# Patient Record
Sex: Female | Born: 1950 | Race: Black or African American | Hispanic: No | Smoking: Former smoker
Health system: Southern US, Community
[De-identification: ages and names within clinical notes are randomized; demographics above are authoritative.]

## PROBLEM LIST (undated history)

## (undated) DIAGNOSIS — I10 Essential (primary) hypertension: Secondary | ICD-10-CM

## (undated) DIAGNOSIS — E785 Hyperlipidemia, unspecified: Secondary | ICD-10-CM

## (undated) DIAGNOSIS — I639 Cerebral infarction, unspecified: Secondary | ICD-10-CM

## (undated) HISTORY — DX: Hyperlipidemia, unspecified: E78.5

## (undated) HISTORY — PX: FOOT SURGERY: SHX648

## (undated) HISTORY — DX: Cerebral infarction, unspecified: I63.9

---

## 2009-07-13 ENCOUNTER — Encounter (HOSPITAL_BASED_OUTPATIENT_CLINIC_OR_DEPARTMENT_OTHER): Admission: RE | Admit: 2009-07-13 | Discharge: 2009-07-31 | Payer: Self-pay | Admitting: Internal Medicine

## 2009-08-09 ENCOUNTER — Encounter (HOSPITAL_BASED_OUTPATIENT_CLINIC_OR_DEPARTMENT_OTHER): Admission: RE | Admit: 2009-08-09 | Discharge: 2009-08-28 | Payer: Self-pay | Admitting: Internal Medicine

## 2010-07-24 ENCOUNTER — Other Ambulatory Visit
Admission: RE | Admit: 2010-07-24 | Discharge: 2010-07-24 | Payer: Self-pay | Source: Home / Self Care | Admitting: Family Medicine

## 2013-04-15 ENCOUNTER — Other Ambulatory Visit (HOSPITAL_COMMUNITY)
Admission: RE | Admit: 2013-04-15 | Discharge: 2013-04-15 | Disposition: A | Discharge status: Home / Self Care | Payer: 59 | Source: Ambulatory Visit | Attending: Family Medicine | Admitting: Family Medicine

## 2013-04-15 ENCOUNTER — Other Ambulatory Visit: Payer: Self-pay | Admitting: Family Medicine

## 2013-04-15 DIAGNOSIS — Z124 Encounter for screening for malignant neoplasm of cervix: Secondary | ICD-10-CM | POA: Insufficient documentation

## 2016-05-13 ENCOUNTER — Other Ambulatory Visit (HOSPITAL_COMMUNITY)
Admission: RE | Admit: 2016-05-13 | Discharge: 2016-05-13 | Disposition: A | Discharge status: Home / Self Care | Payer: 59 | Source: Ambulatory Visit | Attending: Physician Assistant | Admitting: Physician Assistant

## 2016-05-13 ENCOUNTER — Other Ambulatory Visit: Payer: Self-pay | Admitting: Physician Assistant

## 2016-05-13 DIAGNOSIS — Z01411 Encounter for gynecological examination (general) (routine) with abnormal findings: Secondary | ICD-10-CM | POA: Diagnosis not present

## 2016-05-23 LAB — CYTOLOGY - PAP

## 2016-12-03 DIAGNOSIS — L239 Allergic contact dermatitis, unspecified cause: Secondary | ICD-10-CM | POA: Diagnosis not present

## 2016-12-03 DIAGNOSIS — M79672 Pain in left foot: Secondary | ICD-10-CM | POA: Diagnosis not present

## 2016-12-03 DIAGNOSIS — L84 Corns and callosities: Secondary | ICD-10-CM | POA: Diagnosis not present

## 2016-12-03 DIAGNOSIS — M2041 Other hammer toe(s) (acquired), right foot: Secondary | ICD-10-CM | POA: Diagnosis not present

## 2017-05-03 ENCOUNTER — Inpatient Hospital Stay (HOSPITAL_COMMUNITY)
Admission: EM | Admit: 2017-05-03 | Discharge: 2017-05-06 | DRG: 065 | Disposition: A | Discharge status: Rehabilitation Facility | Payer: 59 | Attending: Internal Medicine | Admitting: Internal Medicine

## 2017-05-03 ENCOUNTER — Emergency Department (HOSPITAL_COMMUNITY): Payer: 59

## 2017-05-03 ENCOUNTER — Observation Stay (HOSPITAL_COMMUNITY): Payer: 59

## 2017-05-03 ENCOUNTER — Encounter (HOSPITAL_COMMUNITY): Payer: Self-pay | Admitting: *Deleted

## 2017-05-03 DIAGNOSIS — G8194 Hemiplegia, unspecified affecting left nondominant side: Secondary | ICD-10-CM | POA: Diagnosis present

## 2017-05-03 DIAGNOSIS — I10 Essential (primary) hypertension: Secondary | ICD-10-CM

## 2017-05-03 DIAGNOSIS — Z6830 Body mass index (BMI) 30.0-30.9, adult: Secondary | ICD-10-CM

## 2017-05-03 DIAGNOSIS — R531 Weakness: Secondary | ICD-10-CM | POA: Diagnosis not present

## 2017-05-03 DIAGNOSIS — R29810 Facial weakness: Secondary | ICD-10-CM | POA: Diagnosis present

## 2017-05-03 DIAGNOSIS — D62 Acute posthemorrhagic anemia: Secondary | ICD-10-CM | POA: Diagnosis not present

## 2017-05-03 DIAGNOSIS — E876 Hypokalemia: Secondary | ICD-10-CM

## 2017-05-03 DIAGNOSIS — E669 Obesity, unspecified: Secondary | ICD-10-CM | POA: Diagnosis present

## 2017-05-03 DIAGNOSIS — E785 Hyperlipidemia, unspecified: Secondary | ICD-10-CM | POA: Diagnosis present

## 2017-05-03 DIAGNOSIS — W19XXXA Unspecified fall, initial encounter: Secondary | ICD-10-CM

## 2017-05-03 DIAGNOSIS — R29711 NIHSS score 11: Secondary | ICD-10-CM | POA: Diagnosis present

## 2017-05-03 DIAGNOSIS — R52 Pain, unspecified: Secondary | ICD-10-CM

## 2017-05-03 DIAGNOSIS — E119 Type 2 diabetes mellitus without complications: Secondary | ICD-10-CM | POA: Diagnosis present

## 2017-05-03 DIAGNOSIS — Z23 Encounter for immunization: Secondary | ICD-10-CM

## 2017-05-03 DIAGNOSIS — Z823 Family history of stroke: Secondary | ICD-10-CM

## 2017-05-03 DIAGNOSIS — I639 Cerebral infarction, unspecified: Principal | ICD-10-CM | POA: Diagnosis present

## 2017-05-03 DIAGNOSIS — R414 Neurologic neglect syndrome: Secondary | ICD-10-CM | POA: Diagnosis not present

## 2017-05-03 DIAGNOSIS — Z720 Tobacco use: Secondary | ICD-10-CM

## 2017-05-03 DIAGNOSIS — F17210 Nicotine dependence, cigarettes, uncomplicated: Secondary | ICD-10-CM | POA: Diagnosis present

## 2017-05-03 HISTORY — DX: Essential (primary) hypertension: I10

## 2017-05-03 LAB — CBG MONITORING, ED: Glucose-Capillary: 145 mg/dL — ABNORMAL HIGH (ref 65–99)

## 2017-05-03 LAB — DIFFERENTIAL
BASOS ABS: 0 10*3/uL (ref 0.0–0.1)
Basophils Relative: 0 %
Eosinophils Absolute: 0 10*3/uL (ref 0.0–0.7)
Eosinophils Relative: 0 %
Lymphocytes Relative: 20 %
Lymphs Abs: 1.6 10*3/uL (ref 0.7–4.0)
MONO ABS: 0.3 10*3/uL (ref 0.1–1.0)
MONOS PCT: 4 %
Neutro Abs: 5.9 10*3/uL (ref 1.7–7.7)
Neutrophils Relative %: 76 %

## 2017-05-03 LAB — COMPREHENSIVE METABOLIC PANEL
ALK PHOS: 48 U/L (ref 38–126)
ALT: 19 U/L (ref 14–54)
AST: 36 U/L (ref 15–41)
Albumin: 3.8 g/dL (ref 3.5–5.0)
Anion gap: 9 (ref 5–15)
BUN: 12 mg/dL (ref 6–20)
CALCIUM: 9.5 mg/dL (ref 8.9–10.3)
CO2: 25 mmol/L (ref 22–32)
CREATININE: 0.85 mg/dL (ref 0.44–1.00)
Chloride: 105 mmol/L (ref 101–111)
GFR calc non Af Amer: >60 mL/min (ref >60)
GLUCOSE: 149 mg/dL — AB (ref 65–99)
Potassium: 4.3 mmol/L (ref 3.5–5.1)
SODIUM: 139 mmol/L (ref 135–145)
Total Bilirubin: 1.3 mg/dL — ABNORMAL HIGH (ref 0.3–1.2)
Total Protein: 8.1 g/dL (ref 6.5–8.1)

## 2017-05-03 LAB — URINALYSIS, ROUTINE W REFLEX MICROSCOPIC
BILIRUBIN URINE: NEGATIVE
GLUCOSE, UA: NEGATIVE mg/dL
HGB URINE DIPSTICK: NEGATIVE
Ketones, ur: NEGATIVE mg/dL
NITRITE: NEGATIVE
PH: 8 (ref 5.0–8.0)
Protein, ur: NEGATIVE mg/dL
SPECIFIC GRAVITY, URINE: 1.025 (ref 1.005–1.030)

## 2017-05-03 LAB — CBC
HEMATOCRIT: 37 % (ref 36.0–46.0)
Hemoglobin: 12.3 g/dL (ref 12.0–15.0)
MCH: 33 pg (ref 26.0–34.0)
MCHC: 33.2 g/dL (ref 30.0–36.0)
MCV: 99.2 fL (ref 78.0–100.0)
PLATELETS: 212 10*3/uL (ref 150–400)
RBC: 3.73 MIL/uL — AB (ref 3.87–5.11)
RDW: 12.6 % (ref 11.5–15.5)
WBC: 7.8 10*3/uL (ref 4.0–10.5)

## 2017-05-03 LAB — I-STAT TROPONIN, ED: Troponin i, poc: 0.01 ng/mL (ref 0.00–0.08)

## 2017-05-03 LAB — I-STAT CHEM 8, ED
BUN: 18 mg/dL (ref 6–20)
CALCIUM ION: 1.1 mmol/L — AB (ref 1.15–1.40)
CHLORIDE: 107 mmol/L (ref 101–111)
Creatinine, Ser: 0.7 mg/dL (ref 0.44–1.00)
GLUCOSE: 149 mg/dL — AB (ref 65–99)
HCT: 38 % (ref 36.0–46.0)
Hemoglobin: 12.9 g/dL (ref 12.0–15.0)
Potassium: 4.3 mmol/L (ref 3.5–5.1)
Sodium: 141 mmol/L (ref 135–145)
TCO2: 26 mmol/L (ref 22–32)

## 2017-05-03 LAB — PROTIME-INR
INR: 1
Prothrombin Time: 13.1 seconds (ref 11.4–15.2)

## 2017-05-03 LAB — RAPID URINE DRUG SCREEN, HOSP PERFORMED
AMPHETAMINES: NOT DETECTED
BARBITURATES: NOT DETECTED
BENZODIAZEPINES: NOT DETECTED
Cocaine: NOT DETECTED
Opiates: NOT DETECTED
TETRAHYDROCANNABINOL: NOT DETECTED

## 2017-05-03 LAB — APTT: aPTT: 27 seconds (ref 24–36)

## 2017-05-03 LAB — ETHANOL: Alcohol, Ethyl (B): <10 mg/dL (ref <10)

## 2017-05-03 MED ORDER — ACETAMINOPHEN 650 MG RE SUPP: 650.0000 mg | Freq: Four times a day (QID) | RECTAL | Status: DC | PRN

## 2017-05-03 MED ORDER — ACETAMINOPHEN 325 MG PO TABS
650.0000 mg | ORAL_TABLET | Freq: Four times a day (QID) | ORAL | Status: DC | PRN
  Administered 2017-05-06 (×2): 650 mg via ORAL
  Filled 2017-05-03 (×3): qty 2

## 2017-05-03 MED ORDER — TETANUS-DIPHTH-ACELL PERTUSSIS 5-2.5-18.5 LF-MCG/0.5 IM SUSP: 0.5000 mL | Freq: Once | INTRAMUSCULAR | Status: DC

## 2017-05-03 MED ORDER — NICOTINE 14 MG/24HR TD PT24
14.0000 mg | MEDICATED_PATCH | Freq: Every day | TRANSDERMAL | Status: DC
  Administered 2017-05-04 – 2017-05-06 (×3): 14 mg via TRANSDERMAL
  Filled 2017-05-03 (×3): qty 1

## 2017-05-03 MED ORDER — ASPIRIN 325 MG PO TABS
325.0000 mg | ORAL_TABLET | Freq: Every day | ORAL | Status: DC
  Administered 2017-05-04 – 2017-05-06 (×3): 325 mg via ORAL
  Filled 2017-05-03 (×3): qty 1

## 2017-05-03 MED ORDER — PNEUMOCOCCAL VAC POLYVALENT 25 MCG/0.5ML IJ INJ
0.5000 mL | INJECTION | INTRAMUSCULAR | Status: AC | PRN
  Administered 2017-05-05: 0.5 mL via INTRAMUSCULAR
  Filled 2017-05-03: qty 0.5

## 2017-05-03 MED ORDER — KETOROLAC TROMETHAMINE 30 MG/ML IJ SOLN
30.0000 mg | Freq: Once | INTRAMUSCULAR | Status: AC
  Administered 2017-05-04: 30 mg via INTRAVENOUS
  Filled 2017-05-03: qty 1

## 2017-05-03 MED ORDER — STROKE: EARLY STAGES OF RECOVERY BOOK
Freq: Once | Status: AC
  Administered 2017-05-03: 22:00:00
  Filled 2017-05-03: qty 1

## 2017-05-03 MED ORDER — INFLUENZA VAC SPLIT HIGH-DOSE 0.5 ML IM SUSY
0.5000 mL | PREFILLED_SYRINGE | INTRAMUSCULAR | Status: AC
  Administered 2017-05-05: 0.5 mL via INTRAMUSCULAR
  Filled 2017-05-03 (×2): qty 0.5

## 2017-05-03 MED ORDER — SODIUM CHLORIDE 0.9% FLUSH
3.0000 mL | Freq: Two times a day (BID) | INTRAVENOUS | Status: DC
  Administered 2017-05-03 – 2017-05-06 (×6): 3 mL via INTRAVENOUS

## 2017-05-03 MED ORDER — KETOROLAC TROMETHAMINE 30 MG/ML IJ SOLN
30.0000 mg | Freq: Once | INTRAMUSCULAR | Status: AC
  Administered 2017-05-03: 30 mg via INTRAVENOUS
  Filled 2017-05-03: qty 1

## 2017-05-03 MED ORDER — LIDOCAINE-EPINEPHRINE 2 %-1:100000 IJ SOLN: 20.0000 mL | Freq: Once | INTRAMUSCULAR | Status: DC

## 2017-05-03 MED ORDER — IOPAMIDOL (ISOVUE-370) INJECTION 76%: 100.0000 mL | Freq: Once | INTRAVENOUS | Status: DC | PRN

## 2017-05-03 MED ORDER — HEPARIN SODIUM (PORCINE) 5000 UNIT/ML IJ SOLN
5000.0000 [IU] | Freq: Three times a day (TID) | INTRAMUSCULAR | Status: DC
  Administered 2017-05-03 – 2017-05-06 (×8): 5000 [IU] via SUBCUTANEOUS
  Filled 2017-05-03 (×8): qty 1

## 2017-05-03 NOTE — ED Notes (Signed)
Report attempted 

## 2017-05-03 NOTE — ED Notes (Signed)
Bladder scanned

## 2017-05-03 NOTE — ED Triage Notes (Signed)
Pt woke this morning at 0730 and called Daughter because she felt funny. Pt last remembers feeling well on 9-28 At 2130 before bed.  When daughter and EMS entered home Pt was found on floor beside bed.  Pt presented to EMS with  Lt sided facial droop and let sided flacid to Lt arm and Lt leg.  Pt has a current healing RT ankle Fx that occurred in August 2018

## 2017-05-03 NOTE — Progress Notes (Signed)
Placed a call to the RN in the emergency room that gave report on the patient to verify the stroke swallow screen was completed.  Maryfrances Bunnell, RN stated it was completed in the emergency room and that the patient failed the exam.  A MBSS has been ordered for the patient.  Will continue to monitor.  Wilson Singer, RN

## 2017-05-03 NOTE — H&P (Signed)
Date: 05/03/2017               Patient Name:  Kelly Lowe MRN: [id number]  DOB: 04/13/1951 Age / Sex: 66 y.o., female   PCP: Patient, No Pcp Per         Medical Service: Internal Medicine Teaching Service         Attending Physician: Dr. Lynnae January, Real Cons, MD    First Contact: Dr. Lurlean Horns Pager: [phone]  Second Contact: Dr. Tiburcio Pea Pager: [phone]       After Hours (After 5p/  First Contact Pager: [phone]  weekends / holidays): Second Contact Pager: [phone]   Chief Complaint: L arm and leg weakness   History of Present Illness:  Kelly Lowe is a 66 y.o. female with a history of HTN and tobacco abuse who presents to the ED with L sided weakness that started this morning. Patient stated she was in her usual state of health yesterday prior to going to bed. This morning when she wakes up she felt sick and a heaviness over the left side of her body. When she attempted to get out of bed she was unable to do it due to left sided weakness and fell on her buttocks. She proceeded to call her daughter who then called 911 and she arrived to the ED by ambulance. She denied headache, dizziness, changes in vision, and changes in speech at the time. Her only complaint when seen was lower back pain. No history of CVA in the past. She has been compliant with her home medicines. Denies dysphagia, chest pain, palpitations, shortness of breath, abdominal pain, N/V.   ED course: Patient was hemodynamically stable on arrival to the ED. Her BP was 137/66. Head CT showed R caudate infarction. Lab work was unremarkable.    Review of Systems: A complete ROS was negative except as per HPI.   Meds:  Current Meds  Medication Sig  . amLODipine (NORVASC) 5 MG tablet Take 5 mg by mouth daily.  Marland Kitchen BIOTIN PO Take 1 tablet by mouth daily.  . Cyanocobalamin (VITAMIN B 12 PO) Take 1 tablet by mouth daily.  . hydrochlorothiazide (HYDRODIURIL) 12.5 MG tablet Take 12.5 mg by mouth every  morning.  Marland Kitchen ibuprofen (ADVIL,MOTRIN) 200 MG tablet Take 200 mg by mouth every 6 (six) hours as needed for mild pain.  . Multiple Vitamin (MULTIVITAMIN WITH MINERALS) TABS tablet Take 1 tablet by mouth daily.     Allergies: Allergies as of 05/03/2017  . (No Known Allergies)   Past Medical History:  Diagnosis Date  . Hypertension     Family History:  Stroke - maternal and paternal grandparents   Social History:  Patient lives home alone and able to do chores by herself. Compliant with her medications but does not restrict sodium in her diet. Smokes half a pack of cigarettes since her teenage years. Drinks alcohol only on special occasions, per daughters. Denied drug use.    Physical Exam: Blood pressure 139/86, pulse 66, resp. rate 20, SpO2 97 %.  General: pleasant female, obese, well-developed, lying in bed on her left side, seemed uncomfortable but in no acute distress HENT: NCAT, neck supple and FROM, OP clear without exudates or erythema, MMM Eyes: anicteric sclera, PERRL Cardiac: regular rate and rhythm, nl S1/S2, no murmurs, rubs or gallops  Pulm: CTAB, no wheezes or crackles, no increased work of breathing  Abd: soft, NTND, bowel sounds present  Neuro: A&Ox3, CN II-XII intact, 1/5 strength on  both LUE and LLE, sensation intact in all four extremities, no dysmetria or dysdiadochokinesia on the R, unable to test on the L  Ext: warm and well perfused, no peripheral edema, wears ankle boot at baseline due to recent R foot fracture in 03/2017   EKG: personally reviewed my interpretation is NSR with HR 77 bpm, normal intervals, no axis deviation, T-wave flattening and V2 and V3, no acute signs of ischemia noted   Assessment & Plan by Problem:  [name] is a 66 y.o. 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   # R caudate CVA: Patient presented with LUE and LLE weakness that started  this morning and was found to have R caudate CVA on head CT. Head and neck CT angio with no abnormalities. She was stable when seen in the ED. Neurological deficits include: LUE and LLE weakness (1/5 bilaterally). Risk factors include: HTN and long history of tobacco abuse. Neurology following.  - Will allow permissive HTN up to 220/120 for 24-48 hrs  - MRI brain w/o contrast  - Risk stratification: A1c + lipid panel - TTE  - PT + OT + Speech consult  - On telemetry   # Lower back pain: Patient reports lower back pain that started this morning after she fell from her bed.  No bruising noted on exam and no point tenderness over spinous processes. She does have paraspinal muscle tenderness bilaterally. Will obtain imaging to rule out lumbar spine and hip fracture. - F/u lumbar spine XR and b/l hip XR  - IV Toradol x1 for pain   # HTN: On amlodipine 5 mg QD and HCTZ 12.5 mg QD at home.  - Hold home antihypertensive medications and allow permissive HTN as above   # Tobacco abuse: Patient smokes 1/2 pack of cigarettes per day since teenage years. - Nicotine patch  - Educated patient on the importance of smoking cessation  - Encouraged smoking cessation    F: none  E: will monitor  N: NPO pending swallow study --> Low sodium diet   VTE ppx: SQ heparin   Code status: Full code, confirmed on admission   Dispo: Admit patient to Observation with expected length of stay less than 2 midnights.  Signed: Welford Roche, MD  Internal Medicine PGY-1  P [phone]

## 2017-05-03 NOTE — Progress Notes (Signed)
Patient admitted to unit at 1850 from ED. Report received from [place], [place]. Patient alert and oriented and given instructions on how to use call bell and phone. Put on telemetry box 39. Will continue to monitor.

## 2017-05-03 NOTE — Code Documentation (Signed)
66 year old female presents to [facility] ED via [facility] as code stroke.  She reports all normal last night at 2130 when going to bed.  On awaking this AM she noted left side weakness and was found on the floor by her daughter and EMS this AM.  She presents to ED with left facial droop, left arm and left leg flacid and some dysarthria with right gaze preference.  She was met at the bridge by stroke team - taken to CT scan.  Initial NIHSS 11. Dr. Leonel Ramsay at the bedside in CT scan - CTA and CTP ordered.  No acute treatment based on scan results per Dr. Leonel Ramsay.  Handoff to [employer].

## 2017-05-03 NOTE — ED Notes (Signed)
Pt placed on PureWick catheter per Luellen Pucker Investment banker, corporate)

## 2017-05-03 NOTE — ED Provider Notes (Signed)
Emergency Department Provider Note   I have reviewed the triage vital signs and the nursing notes.   HISTORY  Chief Complaint Cerebrovascular Accident   HPI Kelly Lowe is a 66 y.o. female with unknown past medical history the presents to the emergency department today with last known well of last night.  She started having some abdominal pain and weakness last night but not focal. This when she woke up and realized she could not move her left arm or her left leg and her family know she had a left facial droop so EMS was called and she is brought here for evaluation. Patient unable to provide much more history.  Level V Caveat secondary to acuity   Past Medical History:  Diagnosis Date  . Hypertension     Patient Active Problem List   Diagnosis Date Noted  . Stroke [facility]) 05/03/2017    Past Surgical History:  Procedure Laterality Date  . FOOT SURGERY        Allergies Patient has no known allergies.  History reviewed. No pertinent family history.  Social History Social History  Substance Use Topics  . Smoking status: Current Every Day Smoker    Packs/day: 0.50    Years: 25.00    Types: Cigarettes  . Smokeless tobacco: Never Used  . Alcohol use 0.6 oz/week    1 Glasses of wine per week     Comment: social    Review of Systems  All other systems negative except as documented in the HPI. All pertinent positives and negatives as reviewed in the HPI. ____________________________________________   PHYSICAL EXAM:  VITAL SIGNS: ED Triage Vitals  Enc Vitals Group     BP      Pulse      Resp      Temp      Temp src      SpO2      Weight      Height      Head Circumference      Peak Flow      Pain Score      Pain Loc      Pain Edu?      Excl. in [place]?     Constitutional: Alert and oriented. Well appearing and in no acute distress. Eyes: Conjunctivae are normal. PERRL. EOMI. Head: Atraumatic. Nose: No  congestion/rhinnorhea. Mouth/Throat: Mucous membranes are moist.  Oropharynx non-erythematous. Neck: No stridor.  No meningeal signs.   Cardiovascular: Normal rate, regular rhythm. Good peripheral circulation. Grossly normal heart sounds.   Respiratory: Normal respiratory effort.  No retractions. Lungs CTAB. Gastrointestinal: Soft and nontender. No distention.  Musculoskeletal: No lower extremity tenderness nor edema. No gross deformities of extremities. Neurologic:  Normal speech and language. Significant L facial paralysis and L extremity strength.  Skin:  Skin is warm, dry and intact. No rash noted.   ____________________________________________   LABS (all labs ordered are listed, but only abnormal results are displayed)  Labs Reviewed  CBC - Abnormal; Notable for the following:       Result Value   RBC 3.73 (*)    All other components within normal limits  COMPREHENSIVE METABOLIC PANEL - Abnormal; Notable for the following:    Glucose, Bld 149 (*)    Total Bilirubin 1.3 (*)    All other components within normal limits  URINALYSIS, ROUTINE W REFLEX MICROSCOPIC - Abnormal; Notable for the following:    Color, Urine STRAW (*)    Leukocytes, UA TRACE (*)  Bacteria, UA RARE (*)    Squamous Epithelial / LPF 0-5 (*)    All other components within normal limits  I-STAT CHEM 8, ED - Abnormal; Notable for the following:    Glucose, Bld 149 (*)    Calcium, Ion 1.10 (*)    All other components within normal limits  CBG MONITORING, ED - Abnormal; Notable for the following:    Glucose-Capillary 145 (*)    All other components within normal limits  ETHANOL  PROTIME-INR  APTT  DIFFERENTIAL  RAPID URINE DRUG SCREEN, HOSP PERFORMED  I-STAT TROPONIN, ED   ____________________________________________  EKG   EKG Interpretation  Date/Time:  Saturday May 03 2017 08:55:17 EDT Ventricular Rate:  77 PR Interval:    QRS Duration: 94 QT Interval:  406 QTC Calculation: 460 R  Axis:   6 Text Interpretation:  Sinus rhythm Abnormal R-wave progression, early transition Borderline T abnormalities, anterior leads No old tracing to compare Confirmed by Merrily Pew [phone]) on 05/03/2017 10:54:23 AM      ____________________________________________  RADIOLOGY  Ct Angio Head W Or Wo Contrast  Result Date: 05/03/2017 CLINICAL DATA:  Left facial droop.  Right gaze. EXAM: CT ANGIOGRAPHY HEAD AND NECK CT PERFUSION BRAIN TECHNIQUE: Multidetector CT imaging of the head and neck was performed using the standard protocol during bolus administration of intravenous contrast. Multiplanar CT image reconstructions and MIPs were obtained to evaluate the vascular anatomy. Carotid stenosis measurements (when applicable) are obtained utilizing NASCET criteria, using the distal internal carotid diameter as the denominator. Multiphase CT imaging of the brain was performed following IV bolus contrast injection. Subsequent parametric perfusion maps were calculated using RAPID software. CONTRAST:  90 mL Isovue 370 COMPARISON:  Noncontrast head CT earlier today FINDINGS: CTA NECK FINDINGS Aortic arch: Standard 3 vessel aortic arch with mild atherosclerotic plaque. Widely patent brachiocephalic and subclavian arteries. Right carotid system: Patent without evidence of stenosis or dissection. Left carotid system: Patent without evidence of stenosis or dissection. Vertebral arteries: Patent without evidence of stenosis or dissection. The right vertebral artery is slightly larger than the left. Skeleton: No fracture or suspicious osseous lesion. Other neck: Mild thyroid heterogeneity without sizable discrete nodule. Upper chest: Minimal atelectasis in the visualized lungs. Review of the MIP images confirms the above findings CTA HEAD FINDINGS Anterior circulation: The internal carotid arteries are patent from skullbase to carotid termini without stenosis. There is minimal right cavernous segment atherosclerosis.  ACAs and MCAs are patent without evidence of proximal branch occlusion or significant proximal stenosis. The left A1 segment is moderately dominant. No aneurysm. Posterior circulation: The intracranial vertebral arteries are patent to the basilar with the right being mildly dominant. There is mild V4 segment atherosclerosis and irregularity without significant stenosis. Patent bilateral PICA, left AICA, and bilateral SCA origins are identified. The basilar artery is widely patent. Posterior communicating arteries are diminutive or absent. PCAs are patent with moderate right and mild left P3 and more distal branch vessel irregularity and narrowing but no significant proximal stenosis. No aneurysm. Venous sinuses: As permitted by contrast timing, patent. Anatomic variants: None. Delayed phase: Not performed. Review of the MIP images confirms the above findings CT Brain Perfusion Findings: CBF (<30%) Volume:  0 mL Perfusion (Tmax>6.0s) volume:  0 mL Mismatch Volume: n/a Infarction Location: n/a IMPRESSION: 1. Negative cerebral perfusion imaging. 2. No emergent large vessel occlusion. 3. Intracranial atherosclerosis with most notable involvement of right greater than left PCA branch vessels. No flow limiting proximal stenosis. 4. Widely patent cervical  carotid and vertebral arteries. Preliminary findings of negative perfusion imaging and no emergent large vessel occlusion were communicated in person to Dr. Leonel Ramsay on 05/03/2017 at 9:20 a.m. Electronically Signed   By: Logan Bores M.D.   On: 05/03/2017 09:41   Dg Lumbar Spine Complete  Result Date: 05/03/2017 CLINICAL DATA:  Low back pain since falling this morning. EXAM: LUMBAR SPINE - COMPLETE 4+ VIEW COMPARISON:  None. FINDINGS: Five lumbar type vertebral bodies. There is a minimal convex right scoliosis. The lateral alignment is normal. The disc spaces are preserved. Facet degenerative changes are present inferiorly. There is no evidence of acute fracture or  pars defect. There is contrast material in the bladder from preceding CT scan. Aortic atherosclerosis noted. IMPRESSION: No evidence of acute lumbar spine injury.  Facet hypertrophy noted. Electronically Signed   By: Richardean Sale M.D.   On: 05/03/2017 16:43   Ct Angio Neck W Or Wo Contrast  Result Date: 05/03/2017 CLINICAL DATA:  Left facial droop.  Right gaze. EXAM: CT ANGIOGRAPHY HEAD AND NECK CT PERFUSION BRAIN TECHNIQUE: Multidetector CT imaging of the head and neck was performed using the standard protocol during bolus administration of intravenous contrast. Multiplanar CT image reconstructions and MIPs were obtained to evaluate the vascular anatomy. Carotid stenosis measurements (when applicable) are obtained utilizing NASCET criteria, using the distal internal carotid diameter as the denominator. Multiphase CT imaging of the brain was performed following IV bolus contrast injection. Subsequent parametric perfusion maps were calculated using RAPID software. CONTRAST:  90 mL Isovue 370 COMPARISON:  Noncontrast head CT earlier today FINDINGS: CTA NECK FINDINGS Aortic arch: Standard 3 vessel aortic arch with mild atherosclerotic plaque. Widely patent brachiocephalic and subclavian arteries. Right carotid system: Patent without evidence of stenosis or dissection. Left carotid system: Patent without evidence of stenosis or dissection. Vertebral arteries: Patent without evidence of stenosis or dissection. The right vertebral artery is slightly larger than the left. Skeleton: No fracture or suspicious osseous lesion. Other neck: Mild thyroid heterogeneity without sizable discrete nodule. Upper chest: Minimal atelectasis in the visualized lungs. Review of the MIP images confirms the above findings CTA HEAD FINDINGS Anterior circulation: The internal carotid arteries are patent from skullbase to carotid termini without stenosis. There is minimal right cavernous segment atherosclerosis. ACAs and MCAs are patent  without evidence of proximal branch occlusion or significant proximal stenosis. The left A1 segment is moderately dominant. No aneurysm. Posterior circulation: The intracranial vertebral arteries are patent to the basilar with the right being mildly dominant. There is mild V4 segment atherosclerosis and irregularity without significant stenosis. Patent bilateral PICA, left AICA, and bilateral SCA origins are identified. The basilar artery is widely patent. Posterior communicating arteries are diminutive or absent. PCAs are patent with moderate right and mild left P3 and more distal branch vessel irregularity and narrowing but no significant proximal stenosis. No aneurysm. Venous sinuses: As permitted by contrast timing, patent. Anatomic variants: None. Delayed phase: Not performed. Review of the MIP images confirms the above findings CT Brain Perfusion Findings: CBF (<30%) Volume:  0 mL Perfusion (Tmax>6.0s) volume:  0 mL Mismatch Volume: n/a Infarction Location: n/a IMPRESSION: 1. Negative cerebral perfusion imaging. 2. No emergent large vessel occlusion. 3. Intracranial atherosclerosis with most notable involvement of right greater than left PCA branch vessels. No flow limiting proximal stenosis. 4. Widely patent cervical carotid and vertebral arteries. Preliminary findings of negative perfusion imaging and no emergent large vessel occlusion were communicated in person to Dr. Leonel Ramsay on 05/03/2017 at 9:20  a.m. Electronically Signed   By: Logan Bores M.D.   On: 05/03/2017 09:41   Ct Cerebral Perfusion W Contrast  Result Date: 05/03/2017 CLINICAL DATA:  Left facial droop.  Right gaze. EXAM: CT ANGIOGRAPHY HEAD AND NECK CT PERFUSION BRAIN TECHNIQUE: Multidetector CT imaging of the head and neck was performed using the standard protocol during bolus administration of intravenous contrast. Multiplanar CT image reconstructions and MIPs were obtained to evaluate the vascular anatomy. Carotid stenosis  measurements (when applicable) are obtained utilizing NASCET criteria, using the distal internal carotid diameter as the denominator. Multiphase CT imaging of the brain was performed following IV bolus contrast injection. Subsequent parametric perfusion maps were calculated using RAPID software. CONTRAST:  90 mL Isovue 370 COMPARISON:  Noncontrast head CT earlier today FINDINGS: CTA NECK FINDINGS Aortic arch: Standard 3 vessel aortic arch with mild atherosclerotic plaque. Widely patent brachiocephalic and subclavian arteries. Right carotid system: Patent without evidence of stenosis or dissection. Left carotid system: Patent without evidence of stenosis or dissection. Vertebral arteries: Patent without evidence of stenosis or dissection. The right vertebral artery is slightly larger than the left. Skeleton: No fracture or suspicious osseous lesion. Other neck: Mild thyroid heterogeneity without sizable discrete nodule. Upper chest: Minimal atelectasis in the visualized lungs. Review of the MIP images confirms the above findings CTA HEAD FINDINGS Anterior circulation: The internal carotid arteries are patent from skullbase to carotid termini without stenosis. There is minimal right cavernous segment atherosclerosis. ACAs and MCAs are patent without evidence of proximal branch occlusion or significant proximal stenosis. The left A1 segment is moderately dominant. No aneurysm. Posterior circulation: The intracranial vertebral arteries are patent to the basilar with the right being mildly dominant. There is mild V4 segment atherosclerosis and irregularity without significant stenosis. Patent bilateral PICA, left AICA, and bilateral SCA origins are identified. The basilar artery is widely patent. Posterior communicating arteries are diminutive or absent. PCAs are patent with moderate right and mild left P3 and more distal branch vessel irregularity and narrowing but no significant proximal stenosis. No aneurysm. Venous  sinuses: As permitted by contrast timing, patent. Anatomic variants: None. Delayed phase: Not performed. Review of the MIP images confirms the above findings CT Brain Perfusion Findings: CBF (<30%) Volume:  0 mL Perfusion (Tmax>6.0s) volume:  0 mL Mismatch Volume: n/a Infarction Location: n/a IMPRESSION: 1. Negative cerebral perfusion imaging. 2. No emergent large vessel occlusion. 3. Intracranial atherosclerosis with most notable involvement of right greater than left PCA branch vessels. No flow limiting proximal stenosis. 4. Widely patent cervical carotid and vertebral arteries. Preliminary findings of negative perfusion imaging and no emergent large vessel occlusion were communicated in person to Dr. Leonel Ramsay on 05/03/2017 at 9:20 a.m. Electronically Signed   By: Logan Bores M.D.   On: 05/03/2017 09:41   Dg Hips Bilat With Pelvis 3-4 Views  Result Date: 05/03/2017 CLINICAL DATA:  Low back pain following a fall this morning. EXAM: DG HIP (WITH OR WITHOUT PELVIS) 3-4V BILAT COMPARISON:  None. FINDINGS: Excreted contrast in the urinary bladder. Normal appearing hips without fracture or dislocation. Lower lumbar spine degenerative changes. IMPRESSION: No hip fracture or dislocation. Electronically Signed   By: Claudie Revering M.D.   On: 05/03/2017 16:43   Ct Head Code Stroke Wo Contrast  Result Date: 05/03/2017 CLINICAL DATA:  Code stroke. Left-sided flaccid. Gaze to the left and facial droop. EXAM: CT HEAD WITHOUT CONTRAST TECHNIQUE: Contiguous axial images were obtained from the base of the skull through the vertex without intravenous  contrast. COMPARISON:  None. FINDINGS: Brain: Age-indeterminate lenticulostriate pattern infarct in the right caudate head. No visible cortical infarct. No hemorrhage, hydrocephalus, or masslike finding. There is chronic small vessel ischemic type change in the deep cerebral white matter. Vascular: Atherosclerotic calcification.  No hyperdense vessel. Skull: No acute or  aggressive finding. Sinuses/Orbits: No acute finding Other: Prelim text page sent 05/03/2017 at 8:42 am to Dr. Leonel Ramsay. ASPECTS [facility] Stroke Program Early CT Score) - Ganglionic level infarction (caudate, lentiform nuclei, internal capsule, insula, M1-M3 cortex): 6 - Supraganglionic infarction (M4-M6 cortex): 3 Total score (0-10 with 10 being normal): 9 IMPRESSION: 1. Age indeterminate right caudate head infarction with a lenticulostriate pattern. ASPECTS is 9. 2. Negative for acute hemorrhage. 3. Chronic small vessel ischemia. Electronically Signed   By: Monte Fantasia M.D.   On: 05/03/2017 08:43    ____________________________________________   PROCEDURES  Procedure(s) performed:   Procedures   ____________________________________________   INITIAL IMPRESSION / ASSESSMENT AND PLAN / ED COURSE  Pertinent labs & imaging results that were available during my care of the patient were reviewed by me and considered in my medical decision making (see chart for details).  Stroke. Not tPA candidate as not sure when it happened and no salvageable penumbra on cta. Will admit to medicine.    ____________________________________________  FINAL CLINICAL IMPRESSION(S) / ED DIAGNOSES  Final diagnoses:  Cerebral infarction, unspecified mechanism ([place])  Fall     MEDICATIONS GIVEN DURING THIS VISIT:  Medications  iopamidol (ISOVUE-370) 76 % injection 100 mL (not administered)  ketorolac (TORADOL) 30 MG/ML injection 30 mg (not administered)     NEW OUTPATIENT MEDICATIONS STARTED DURING THIS VISIT:  New Prescriptions   No medications on file    Note:  This document was prepared using Dragon voice recognition software and may include unintentional dictation errors.   Okie Bogacz, Corene Cornea, MD 05/03/17 [phone]

## 2017-05-03 NOTE — Consult Note (Signed)
Neurology Consultation Reason for Consult: Left-sided weakness Referring Physician: Mesner, J  CC: Left-sided weakness  History is obtained from: Patient  HPI: Kelly Lowe is a 66 y.o. female last in her normal state of health last night around 11 which went to bed.. She awoke with left-sided weakness. Due to the presence of gaze deviation and weakness, code stroke was activated and she was taken for CT perfusion/angiogram. This was negative for large vessel occlusion.   LKW: Last night prior to bed tpa given?: no, outside of window   ROS: A 14 point ROS was performed and is negative except as noted in the HPI.   Past Medical History:  Diagnosis Date  . Hypertension      Family history: No history of similar   Social History:  reports that she has been smoking Cigarettes.  She has a 12.50 pack-year smoking history. She has never used smokeless tobacco. She reports that she drinks about 0.6 oz of alcohol per week . She reports that she does not use drugs.   Exam: Current vital signs: BP (!) 152/79   Pulse 82   Resp 18   SpO2 97%  Vital signs in last 24 hours: Pulse Rate:  [69-82] 82 (09/29 1200) Resp:  [14-22] 18 (09/29 1200) BP: (137-156)/(75-102) 152/79 (09/29 1200) SpO2:  [94 %-99 %] 97 % (09/29 1200)   Physical Exam  Constitutional: Appears well-developed and well-nourished.  Psych: Affect appropriate to situation Eyes: No scleral injection HENT: No OP obstrucion Head: Normocephalic.  Cardiovascular: Normal rate and regular rhythm.  Respiratory: Effort normal and breath sounds normal to anterior ascultation GI: Soft.  No distension. There is no tenderness.  Skin: WDI  Neuro: Mental Status: Patient is awake, alert, oriented to person, place, month, year, and situation. Patient is able to give a clear and coherent history. No signs of aphasia or neglect Cranial Nerves: II: Visual Fields are full. Pupils are equal, round, and reactive to light.    III,IV, VI: She has a right gaze preference V: Facial sensation is symmetric to temperature VII: Facial movement is symmetric.  VIII: hearing is intact to voice X: Uvula elevates symmetrically XI: Shoulder shrug is symmetric. XII: tongue is midline without atrophy or fasciculations.  Motor: Tone is normal. Bulk is normal. 5/5 strength was present  on the right side, on the left side she is plegic in the arm and leg Sensory: Sensation is symmetric to light touch and temperature in the arms and legs. Cerebellar: No ataxia on the right  I have reviewed labs in epic and the results pertinent to this consultation are: Normal creatinine  I have reviewed the images obtained: CT A/P-negative  Impression: 66 year old female with likely small ischemic infarct. She will need to be admitted for further workup and therapy.  Recommendations: 1. HgbA1c, fasting lipid panel 2. MRI  of the brain without contrast 3. Frequent neuro checks 4. Echocardiogram 5. Prophylactic therapy-Antiplatelet med: Aspirin - dose 325mg  PO or 300mg  PR 6. Risk factor modification 7. Telemetry monitoring 8. PT consult, OT consult, Speech consult 9. please page stroke NP  Or  PA  Or MD  from 8am -4 pm as this patient will be followed by the stroke team at this point.   You can look them up on [email or web address]      Roland Rack, MD [facility] [phone]  If 7pm- 7am, please page neurology on call as listed in [place].

## 2017-05-04 DIAGNOSIS — Z823 Family history of stroke: Secondary | ICD-10-CM | POA: Diagnosis not present

## 2017-05-04 DIAGNOSIS — S92591D Other fracture of right lesser toe(s), subsequent encounter for fracture with routine healing: Secondary | ICD-10-CM | POA: Diagnosis not present

## 2017-05-04 DIAGNOSIS — R000 Tachycardia, unspecified: Secondary | ICD-10-CM | POA: Diagnosis present

## 2017-05-04 DIAGNOSIS — G8929 Other chronic pain: Secondary | ICD-10-CM | POA: Diagnosis present

## 2017-05-04 DIAGNOSIS — I69312 Visuospatial deficit and spatial neglect following cerebral infarction: Secondary | ICD-10-CM | POA: Diagnosis not present

## 2017-05-04 DIAGNOSIS — G479 Sleep disorder, unspecified: Secondary | ICD-10-CM | POA: Diagnosis present

## 2017-05-04 DIAGNOSIS — S92901S Unspecified fracture of right foot, sequela: Secondary | ICD-10-CM | POA: Diagnosis not present

## 2017-05-04 DIAGNOSIS — K5900 Constipation, unspecified: Secondary | ICD-10-CM | POA: Diagnosis not present

## 2017-05-04 DIAGNOSIS — Z720 Tobacco use: Secondary | ICD-10-CM | POA: Diagnosis not present

## 2017-05-04 DIAGNOSIS — G8110 Spastic hemiplegia affecting unspecified side: Secondary | ICD-10-CM | POA: Diagnosis not present

## 2017-05-04 DIAGNOSIS — M79609 Pain in unspecified limb: Secondary | ICD-10-CM | POA: Diagnosis not present

## 2017-05-04 DIAGNOSIS — I6389 Other cerebral infarction: Secondary | ICD-10-CM | POA: Diagnosis not present

## 2017-05-04 DIAGNOSIS — R0682 Tachypnea, not elsewhere classified: Secondary | ICD-10-CM | POA: Diagnosis present

## 2017-05-04 DIAGNOSIS — I639 Cerebral infarction, unspecified: Secondary | ICD-10-CM | POA: Diagnosis present

## 2017-05-04 DIAGNOSIS — I69322 Dysarthria following cerebral infarction: Secondary | ICD-10-CM | POA: Diagnosis not present

## 2017-05-04 DIAGNOSIS — I6300 Cerebral infarction due to thrombosis of unspecified precerebral artery: Secondary | ICD-10-CM | POA: Diagnosis not present

## 2017-05-04 DIAGNOSIS — K5901 Slow transit constipation: Secondary | ICD-10-CM | POA: Diagnosis present

## 2017-05-04 DIAGNOSIS — I672 Cerebral atherosclerosis: Secondary | ICD-10-CM | POA: Diagnosis present

## 2017-05-04 DIAGNOSIS — Z23 Encounter for immunization: Secondary | ICD-10-CM | POA: Diagnosis not present

## 2017-05-04 DIAGNOSIS — R29810 Facial weakness: Secondary | ICD-10-CM | POA: Diagnosis present

## 2017-05-04 DIAGNOSIS — F17210 Nicotine dependence, cigarettes, uncomplicated: Secondary | ICD-10-CM | POA: Diagnosis present

## 2017-05-04 DIAGNOSIS — I361 Nonrheumatic tricuspid (valve) insufficiency: Secondary | ICD-10-CM | POA: Diagnosis not present

## 2017-05-04 DIAGNOSIS — G8194 Hemiplegia, unspecified affecting left nondominant side: Secondary | ICD-10-CM | POA: Diagnosis present

## 2017-05-04 DIAGNOSIS — X58XXXD Exposure to other specified factors, subsequent encounter: Secondary | ICD-10-CM | POA: Diagnosis present

## 2017-05-04 DIAGNOSIS — S92352D Displaced fracture of fifth metatarsal bone, left foot, subsequent encounter for fracture with routine healing: Secondary | ICD-10-CM | POA: Diagnosis not present

## 2017-05-04 DIAGNOSIS — I69319 Unspecified symptoms and signs involving cognitive functions following cerebral infarction: Secondary | ICD-10-CM | POA: Diagnosis not present

## 2017-05-04 DIAGNOSIS — R197 Diarrhea, unspecified: Secondary | ICD-10-CM | POA: Diagnosis not present

## 2017-05-04 DIAGNOSIS — N319 Neuromuscular dysfunction of bladder, unspecified: Secondary | ICD-10-CM | POA: Diagnosis present

## 2017-05-04 DIAGNOSIS — I10 Essential (primary) hypertension: Secondary | ICD-10-CM | POA: Diagnosis present

## 2017-05-04 DIAGNOSIS — R52 Pain, unspecified: Secondary | ICD-10-CM | POA: Diagnosis not present

## 2017-05-04 DIAGNOSIS — Z6830 Body mass index (BMI) 30.0-30.9, adult: Secondary | ICD-10-CM | POA: Diagnosis not present

## 2017-05-04 DIAGNOSIS — E876 Hypokalemia: Secondary | ICD-10-CM | POA: Diagnosis not present

## 2017-05-04 DIAGNOSIS — I340 Nonrheumatic mitral (valve) insufficiency: Secondary | ICD-10-CM | POA: Diagnosis not present

## 2017-05-04 DIAGNOSIS — R4189 Other symptoms and signs involving cognitive functions and awareness: Secondary | ICD-10-CM | POA: Diagnosis present

## 2017-05-04 DIAGNOSIS — F4321 Adjustment disorder with depressed mood: Secondary | ICD-10-CM | POA: Diagnosis not present

## 2017-05-04 DIAGNOSIS — D62 Acute posthemorrhagic anemia: Secondary | ICD-10-CM | POA: Diagnosis not present

## 2017-05-04 DIAGNOSIS — R414 Neurologic neglect syndrome: Secondary | ICD-10-CM | POA: Diagnosis present

## 2017-05-04 DIAGNOSIS — S92901D Unspecified fracture of right foot, subsequent encounter for fracture with routine healing: Secondary | ICD-10-CM | POA: Diagnosis not present

## 2017-05-04 DIAGNOSIS — E669 Obesity, unspecified: Secondary | ICD-10-CM | POA: Diagnosis present

## 2017-05-04 DIAGNOSIS — R29711 NIHSS score 11: Secondary | ICD-10-CM | POA: Diagnosis present

## 2017-05-04 DIAGNOSIS — M545 Low back pain: Secondary | ICD-10-CM | POA: Diagnosis not present

## 2017-05-04 DIAGNOSIS — E785 Hyperlipidemia, unspecified: Secondary | ICD-10-CM | POA: Diagnosis present

## 2017-05-04 DIAGNOSIS — E119 Type 2 diabetes mellitus without complications: Secondary | ICD-10-CM | POA: Diagnosis present

## 2017-05-04 DIAGNOSIS — I69354 Hemiplegia and hemiparesis following cerebral infarction affecting left non-dominant side: Secondary | ICD-10-CM | POA: Diagnosis present

## 2017-05-04 DIAGNOSIS — I69392 Facial weakness following cerebral infarction: Secondary | ICD-10-CM | POA: Diagnosis not present

## 2017-05-04 DIAGNOSIS — R531 Weakness: Secondary | ICD-10-CM | POA: Diagnosis not present

## 2017-05-04 LAB — CBC
HCT: 32.7 % — ABNORMAL LOW (ref 36.0–46.0)
Hemoglobin: 10.7 g/dL — ABNORMAL LOW (ref 12.0–15.0)
MCH: 32.1 pg (ref 26.0–34.0)
MCHC: 32.7 g/dL (ref 30.0–36.0)
MCV: 98.2 fL (ref 78.0–100.0)
PLATELETS: 176 10*3/uL (ref 150–400)
RBC: 3.33 MIL/uL — AB (ref 3.87–5.11)
RDW: 12.7 % (ref 11.5–15.5)
WBC: 5.2 10*3/uL (ref 4.0–10.5)

## 2017-05-04 LAB — BASIC METABOLIC PANEL
ANION GAP: 9 (ref 5–15)
BUN: 11 mg/dL (ref 6–20)
CALCIUM: 9.2 mg/dL (ref 8.9–10.3)
CO2: 23 mmol/L (ref 22–32)
Chloride: 109 mmol/L (ref 101–111)
Creatinine, Ser: 0.77 mg/dL (ref 0.44–1.00)
Glucose, Bld: 102 mg/dL — ABNORMAL HIGH (ref 65–99)
POTASSIUM: 3.3 mmol/L — AB (ref 3.5–5.1)
Sodium: 141 mmol/L (ref 135–145)

## 2017-05-04 LAB — LIPID PANEL
CHOL/HDL RATIO: 6 ratio
Cholesterol: 174 mg/dL (ref 0–200)
HDL: 29 mg/dL — AB (ref >40)
LDL CALC: 114 mg/dL — AB (ref 0–99)
TRIGLYCERIDES: 153 mg/dL — AB (ref <150)
VLDL: 31 mg/dL (ref 0–40)

## 2017-05-04 LAB — HEMOGLOBIN A1C
HEMOGLOBIN A1C: 5.4 % (ref 4.8–5.6)
Mean Plasma Glucose: 108.28 mg/dL

## 2017-05-04 LAB — HIV ANTIBODY (ROUTINE TESTING W REFLEX): HIV SCREEN 4TH GENERATION: NONREACTIVE

## 2017-05-04 MED ORDER — ATORVASTATIN CALCIUM 40 MG PO TABS
40.0000 mg | ORAL_TABLET | Freq: Every day | ORAL | Status: DC
  Administered 2017-05-04 – 2017-05-05 (×2): 40 mg via ORAL
  Filled 2017-05-04 (×2): qty 1

## 2017-05-04 MED ORDER — KETOROLAC TROMETHAMINE 15 MG/ML IJ SOLN
15.0000 mg | Freq: Three times a day (TID) | INTRAMUSCULAR | Status: DC | PRN
  Administered 2017-05-04 – 2017-05-05 (×4): 15 mg via INTRAVENOUS
  Filled 2017-05-04 (×4): qty 1

## 2017-05-04 MED ORDER — SODIUM CHLORIDE 0.9 % IV BOLUS (SEPSIS)
1000.0000 mL | Freq: Once | INTRAVENOUS | Status: AC
  Administered 2017-05-04: 1000 mL via INTRAVENOUS

## 2017-05-04 MED ORDER — POTASSIUM CHLORIDE CRYS ER 20 MEQ PO TBCR
40.0000 meq | EXTENDED_RELEASE_TABLET | Freq: Once | ORAL | Status: AC
Start: 2017-05-04 — End: 2017-05-04
  Administered 2017-05-04: 40 meq via ORAL
  Filled 2017-05-04: qty 2

## 2017-05-04 NOTE — Progress Notes (Signed)
Call placed to physician about patient's lowering blood pressure and urine has darkened.  Order placed for 1 liter bolus.  Will continue to monitor.  Wilson Singer, RN

## 2017-05-04 NOTE — Progress Notes (Signed)
Rehab Admissions Coordinator Note:  Patient was screened by Cleatrice Burke for appropriateness for an Inpatient Acute Rehab Consult per PT and OT recommendations.  At this time, we are recommending Inpatient Rehab consult.  Cleatrice Burke 05/04/2017, 2:21 PM  I can be reached at [phone].

## 2017-05-04 NOTE — Evaluation (Signed)
Clinical/Bedside Swallow Evaluation Patient Details  Name: Kelly Lowe MRN: [id number] Date of Birth: 04/10/1951  Today's Date: 05/04/2017 Time: SLP Start Time (ACUTE ONLY): 76 SLP Stop Time (ACUTE ONLY): 0950 SLP Time Calculation (min) (ACUTE ONLY): 15 min  Past Medical History:  Past Medical History:  Diagnosis Date  . Hypertension    Past Surgical History:  Past Surgical History:  Procedure Laterality Date  . FOOT SURGERY     HPI:  Kelly Lowe. Obando is a 66 y.o. female with a history of HTN and tobacco abuse who presented to the ED with L sided weakness. 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 Per RN notes, pt failed swallow screen in ED.   Assessment / Plan / Recommendation Clinical Impression  Patient presents with mild-moderate risk for aspiration due to left sided facial/lingual weakness, cognitive deficits. Pt alert and cooperative with assessment, with noted R gaze preference. With sips of thin liquids and initial 3oz water swallow challenge, pt has appearance of timely swallow initiation, adequate airway protection, with good oral control despite facial/lingual weakness. With repeated challenging, pt presented with strong cough with multiple straw sips of thin liquids x1 after appearance of discoordinated swallow. Pt takes single sips via cup and straw with no overt signs of aspiration, but is less impulsive with cup sips. Pt able to self-feed pureed solids, requires min cue for lingual sweep of mild L pocketing. Mild left anterior spillage with reduced awareness. Recommend initiating dys 1 with thin liquids via cup, no straw; meds whole with puree. Supervision/monitor for L pocketing of solids. Pt wears top dentures which are currently unavailable; anticipate advancement of solids with increased awareness of compensatory strategies and once pt is able to wear her  top dentures. Educated pt and daughter re: risks of aspiration, diet recommendations, swallow compensatory techniques including slow rate and lingual sweep for clearance of L pocketing. Will follow for tolerance, advancement, possible MBS if warranted.  SLP Visit Diagnosis: Dysphagia, oral phase (R13.11)    Aspiration Risk  Mild aspiration risk;Moderate aspiration risk    Diet Recommendation Dysphagia 1 (Puree);Thin liquid   Liquid Administration via: Cup;No straw Medication Administration: Whole meds with puree Supervision: Patient able to self feed;Full supervision/cueing for compensatory strategies Compensations: Slow rate;Small sips/bites;Lingual sweep for clearance of pocketing Postural Changes: Seated upright at 90 degrees    Other  Recommendations Oral Care Recommendations: Oral care BID Other Recommendations: Have oral suction available   Follow up Recommendations Inpatient Rehab      Frequency and Duration min 2x/week  2 weeks       Prognosis Prognosis for Safe Diet Advancement: Good Barriers to Reach Goals: Cognitive deficits      Swallow Study   General Date of Onset: 05/03/17 HPI: Kelly Lowe. Canny is a 67 y.o. female with a history of HTN and tobacco abuse who presented to the ED with L sided weakness. 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 Per RN notes, pt failed swallow screen in ED. Type of Study: Bedside Swallow Evaluation Previous Swallow Assessment: none in chart Diet Prior to this Study: NPO Temperature Spikes Noted: No Respiratory Status: Room air History of Recent Intubation: No Behavior/Cognition: Alert;Cooperative Oral Cavity Assessment: Within Functional Limits Oral Care Completed by SLP: Yes Oral Cavity - Dentition: Adequate natural dentition;Dentures, not available (top dentures  unavailable) Vision: Functional for  self-feeding Self-Feeding Abilities: Able to feed self Patient Positioning: Upright in bed Baseline Vocal Quality: Normal Volitional Cough: Strong Volitional Swallow: Able to elicit    Oral/Motor/Sensory Function Overall Oral Motor/Sensory Function: Moderate impairment Facial ROM: Reduced left;Suspected CN VII (facial) dysfunction Facial Symmetry: Abnormal symmetry left;Suspected CN VII (facial) dysfunction Facial Strength: Reduced left;Suspected CN VII (facial) dysfunction Facial Sensation: Within Functional Limits Lingual ROM: Reduced right;Suspected CN XII (hypoglossal) dysfunction Lingual Symmetry: Within Functional Limits Lingual Strength: Reduced Lingual Sensation: Within Functional Limits Velum: Within Functional Limits Mandible: Within Functional Limits   Ice Chips Ice chips: Within functional limits   Thin Liquid Thin Liquid: Impaired Pharyngeal  Phase Impairments: Cough - Immediate (x1 discoordinated swallow with multiple straw sips)    Nectar Thick Nectar Thick Liquid: Not tested   Honey Thick Honey Thick Liquid: Not tested   Puree Puree: Impaired Presentation: Self Fed;Spoon Oral Phase Impairments: Reduced labial seal;Reduced lingual movement/coordination Oral Phase Functional Implications: Left anterior spillage;Left lateral sulci pocketing   Solid   GO   Solid: Not tested       Deneise Lever, MS, CCC-SLP Speech-Language Pathologist [phone]  Aliene Altes 05/04/2017,10:21 AM

## 2017-05-04 NOTE — Progress Notes (Signed)
Subjective:  No acute events overnight. Daughter at bedside when seen this morning. Per daughter, patient has regained some movement of her left-sided extremities. We reiterated the importance of tobacco cessation and patient aware she needs to stop smoking. Daughter and family very supportive and stated they will be helping patient during her recovery. They would like for patient to go to CIR for further rehabilitation.   Objective:  Vital signs in last 24 hours: Vitals:   05/03/17 2300 05/04/17 0206 05/04/17 0400 05/04/17 0618  BP: (!) 107/57 (!) 107/51 (!) 97/48 (!) 119/55  Pulse: (!) 50   65  Resp:    19  Temp:    97.7 F (36.5 C)  TempSrc:    Oral  SpO2: 96%   96%  Weight:      Height:       General: pleasant female, obese, well developed, appeared uncomfortable bed but in no acute distress HENT: NCAT, neck supple and FROM, OP clear without exudates or erythema, poor dentition  Eyes: anicteric sclera, PERRL Cardiac: regular rate and rhythm, nl S1/S2, no murmurs, rubs or gallops  Abd: soft, NTND, bowel sounds present  Neuro: A&Ox3, left facial droop noted, 1/5 on LEU and LLE (very mildly improved from yesterday), right gaze preference, no dysarthria, sensation is intact in all four extremities  Ext: warm and well perfused, mild peripheral edema bilaterally     Assessment/Plan:  Kelly Lowe is a 66 y.o. female with a history of HTN and tobacco abuse who presented with LUE and LLE weakness and was found to have a R caudate CVA on CT.    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 MRI of her brain revealed a R basal ganglia and L splenium infarcts. Head and neck CTA with no vascular abnormalities. She was outside the window for tPA therapy. A1c 5.4 and LDL 117. Today, her exam is very similar to yesterday, minimal improvement  noted. She was educated on the importance of tobacco cessation and compliance with daily aspirin and atorvastatin. PT and OT recommended CIR for further rehabilitation, which family agrees with. CIR consult has been placed. Per neurology, patient will also need bilateral lower extremity dopplers due to R calf swelling noted on their exam and hx of immobility given recent R foot fracture in 03/2017. TTE pending.  - Aspirin 81 mg QD + atorvastatin 40 mg QD  - CIR consult  - Will allow permissive HTN up to 220/120  - Follow up TTE and bilateral LE dopplers  - On telemetry   # Hypokalemia: K 3.3 this morning and repleted  - BMP tomorrow   # Lower back pain: Patient reports lower back pain that started after she fell from her bed.  No bruising noted on exam and no point tenderness over spinous processes. She does have paraspinal muscle tenderness bilaterally. Lumbar spine and bilateral hip XRs negative for fractures.  - IV Toradol 15 mg q8h PRN for pain   # HTN: On amlodipine 5 mg QD and HCTZ 12.5 mg QD at home.  - Hold home antihypertensive medications and allow permissive HTN as above   # Tobacco abuse: Patient smokes 1/2 pack of cigarettes per day since teenage years. - Nicotine patch  - Educated patient on the importance of smoking cessation  - Encouraged smoking cessation    F: none  E: will  monitor and replete as needed  N: Dysphagia 1 diet   VTE ppx: SQ heparin   Code status: Full code, confirmed on admission   Dispo: Anticipated discharge in approximately 1-2 day(s) pending completion of stroke workup.   Welford Roche, MD  Internal Medicine PGY-1  P [phone]

## 2017-05-04 NOTE — Progress Notes (Signed)
  Date: 05/04/2017  Patient name: DEMIRA GWYNNE  Medical record number: [id number]  Date of birth: 04-Jul-1951   I have seen and evaluated [name] and discussed their care with the Residency Team.  Ms. Haughey is a 66 year old woman with history of hypertension and tobacco use. She presented with chief complaint of left arm leg and weakness.  On the day prior to admission, she was in hr usual state of health when she went to bed. When she woke, she felt sick and the left side of her body felt heavy. When attempting to get out of bed, she fell due to left-sided weakness landing on her buttocks. She came to the ED by EMS. Her CT on arrival showed an age indeterminate right caudate head infarction. She was not a TPA candidate due to unknown last time normal.   She has been evaluated by PT, OT, and speech therapy. They all recommend CIR. Additionally, she is on a dysphagia 1 pureed diet with thin liquids.  Vitals:   05/04/17 0400 05/04/17 0618  BP: (!) 97/48 (!) 119/55  Pulse:  65  Resp:  19  Temp:  97.7 F (36.5 C)  SpO2:  96%  T max 98.1 HR 65 RR 19 96% RA Gen NAD, lying in bed HRRR no MRG CTAB B with good air flow (anteriorly) ABD + BS, S Ext no edema  Neuro per Dr Frederico Hamman' exam : alert, R gaze preference, EOMI, PERRL, sensation intact, Weak L shoulder shrug, strength 5/5 R, 0/5 L  MRI : 4.4 x 2.1 x 3.2  Acute ischemic right basal ganglia infarct. Possible punctate ischemic infarct in left splenium  I personally viewed the EKG and confirmed my reading with the official read. Sinus, nl axis, no ischemic changes  Assessment and Plan: I have seen and evaluated the patient as outlined above. I agree with the formulated Assessment and Plan as detailed in the residents' note, with the following changes:  Ms. Mccollom is a 66 year old woman with tobacco use and hypertension history presents within acute ischemic right basal ganglia infarct resulting in left sided weakness,  right-sided gaze preference. Her W/U is pending including TTE and echo. She is on a statin and an aspirin and understands the need to quit tobacco. We will continue to follow neurology's, PT, and OT recs, and consult CIR.  She and her daughter understand the need for tobacco cessation. Once her rehabilitation is over, it is likely she will need alternative housing as she has a Psychologist, occupational house with necessities on both floors.   Bartholomew Crews, MD 9/30/20181:31 PM

## 2017-05-04 NOTE — Evaluation (Signed)
Occupational Therapy Evaluation Patient Details Name: Kelly Lowe MRN: [id number] DOB: 1951/01/26 Today's Date: 05/04/2017    History of Present Illness 66 y.o. female with a history of HTN and tobacco abuse who presents to the ED with L sided weakness that started this morning. 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   Clinical Impression   PTA, pt was living alone and independent. Pt currently requiring Mod-Max A for ADLs and Max A for sit<>stand. Pt presenting with flaccidity of LUE, decreased cognition, and poor functional performance. Pt would benefit from further acute OT to facilitate safe dc. Pt highly motivated to return to PLOF. Recommend dc to CIR to intensive OT to optimize pt safety and independence with ADLs and functional mobility.     Follow Up Recommendations  CIR;Supervision/Assistance - 24 hour    Equipment Recommendations  Other (comment) (Defer to next venue)    Recommendations for Other Services Rehab consult;PT consult;Speech consult     Precautions / Restrictions Precautions Precautions: Fall Precaution Comments: R gaze preference Restrictions Weight Bearing Restrictions: Yes RLE Weight Bearing: Weight bearing as tolerated (in wlaking boot) Other Position/Activity Restrictions: Must be in boot for any weight bearing      Mobility Bed Mobility Overal bed mobility: Needs Assistance Bed Mobility: Supine to Sit     Supine to sit: Mod assist     General bed mobility comments: In recliner upon arrival  Transfers Overall transfer level: Needs assistance Equipment used: 1 person hand held assist (support given at L elbow and R gait belt) Transfers: Sit to/from [employer] Transfers Sit to Stand: Max assist Stand pivot transfers: Max assist       General transfer comment: Max assist to power up from recliner. Pt with L lateral lean during  standing    Balance Overall balance assessment: Needs assistance Sitting-balance support: Feet supported;Single extremity supported Sitting balance-Leahy Scale: Poor     Standing balance support: During functional activity Standing balance-Leahy Scale: Poor                             ADL either performed or assessed with clinical judgement   ADL Overall ADL's : Needs assistance/impaired Eating/Feeding: Moderate assistance;Sitting Eating/Feeding Details (indicate cue type and reason): Pt fixed her coffee while sitting in reclienr with support. Pt requiring Max hand over hand assist with use L hand for opening surgar packets and performing bialteral coordination tasks.  Grooming: Moderate assistance;Sitting Grooming Details (indicate cue type and reason): supported sitting Upper Body Bathing: Moderate assistance;Sitting   Lower Body Bathing: Maximal assistance;+2 for physical assistance;Sit to/from stand   Upper Body Dressing : Moderate assistance;Sitting   Lower Body Dressing: Maximal assistance;+2 for physical assistance;Sit to/from stand               Functional mobility during ADLs: Maximal assistance (sit<>stand only) General ADL Comments: Pt demosntrating decreased functional performance. Pt requiring Max hand over hand to use LUE during ADLs. Pt requiring Max A for sit<>stand. Educated family and pt on sensory deficits and to be cautious with hot/cold; pt wanting to drink her coffee.      Vision   Vision Assessment?: Vision impaired- to be further tested in functional context     Perception     Praxis      Pertinent Vitals/Pain Pain Assessment: No/denies pain Faces Pain Scale: No hurt Pain Location: back Pain Descriptors /  Indicators: Aching Pain Intervention(s): Monitored during session;Premedicated before session     Hand Dominance Right   Extremity/Trunk Assessment Upper Extremity Assessment Upper Extremity Assessment: LUE  deficits/detail LUE Deficits / Details: Brunnstrom stage 1 for hand and arm. No movement. Used as dependent assist during fucntional task and required Max A  LUE Sensation: decreased light touch LUE Coordination: decreased fine motor;decreased gross motor   Lower Extremity Assessment Lower Extremity Assessment: Defer to PT evaluation LLE Deficits / Details: Hip flexion 3-/5, quad 2+/5, ankle dorsiflexion 1/5; Stood today briefly with no LLE buckling noted   Cervical / Trunk Assessment Cervical / Trunk Assessment: Other exceptions Cervical / Trunk Exceptions: Poor trunk control. Will asses further   Communication Communication Communication: Other (comment) (Slow to answer questions)   Cognition Arousal/Alertness: Awake/alert Behavior During Therapy: WFL for tasks assessed/performed Overall Cognitive Status: Impaired/Different from baseline Area of Impairment: Attention;Problem solving;Following commands                   Current Attention Level: Selective (L inattention)   Following Commands: Follows one step commands with increased time;Follows multi-step commands with increased time;Follows multi-step commands inconsistently     Problem Solving: Slow processing;Difficulty sequencing;Requires verbal cues;Requires tactile cues General Comments: Pt able to recall 3/3 words for ST memory testing. Pt abel to name five animals with minimal increased time. Pt requiring Max cues to perform simple math problem and demosntrating difficulty with multi-step tasks   General Comments  Daughters present and educated in helping Ms. Cino interact with her L side, stimulate from L; we discussed dc options and recommendation for CIR; They are looking into Ms. Sharlett Iles staying with one of them post rehab    Exercises     Shoulder Instructions      Home Living Family/patient expects to be discharged to:: Inpatient rehab Living Arrangements: Alone                                Additional Comments: Daughters in room plan to arrange for pt to stay with one of them      Prior Functioning/Environment Level of Independence: Independent with assistive device(s)        Comments: ADLs and IADLs. Broke R foot a few weeks ago; must be in boot to stand/walk        OT Problem List: Decreased strength;Decreased range of motion;Decreased activity tolerance;Impaired balance (sitting and/or standing);Decreased coordination;Decreased cognition;Decreased safety awareness;Decreased knowledge of use of DME or AE;Impaired UE functional use;Pain      OT Treatment/Interventions: Self-care/ADL training;Therapeutic exercise;Energy conservation;DME and/or AE instruction;Therapeutic activities;Patient/family education    OT Goals(Current goals can be found in the care plan section) Acute Rehab OT Goals Patient Stated Goal: wants coffee OT Goal Formulation: With patient Time For Goal Achievement: 05/18/17 Potential to Achieve Goals: Good ADL Goals Pt Will Perform Eating: with supervision;with set-up;sitting (using LUE as independent stablizer) Pt Will Perform Grooming: with set-up;with supervision;sitting (using LUE as independent stablizer) Pt Will Perform Upper Body Dressing: with set-up;with supervision;sitting Pt Will Transfer to Toilet: with min assist;ambulating;bedside commode Pt Will Perform Toileting - Clothing Manipulation and hygiene: with min assist;sit to/from stand  OT Frequency: Min 3X/week   Barriers to D/C:            Co-evaluation              AM-PAC PT "6 Clicks" Daily Activity     Outcome Measure Help from another  person eating meals?: A Lot Help from another person taking care of personal grooming?: A Lot Help from another person toileting, which includes using toliet, bedpan, or urinal?: A Lot Help from another person bathing (including washing, rinsing, drying)?: A Lot Help from another person to put on and taking off regular upper body  clothing?: A Lot Help from another person to put on and taking off regular lower body clothing?: A Lot 6 Click Score: 12   End of Session Equipment Utilized During Treatment: Gait belt Nurse Communication: Mobility status (Needs external cath)  Activity Tolerance: Patient tolerated treatment well Patient left: in chair;with call bell/phone within reach;with chair alarm set;with family/visitor present  OT Visit Diagnosis: Unsteadiness on feet (R26.81);Other abnormalities of gait and mobility (R26.89);Muscle weakness (generalized) (M62.81);Other symptoms and signs involving cognitive function;Hemiplegia and hemiparesis Hemiplegia - Right/Left: Left Hemiplegia - dominant/non-dominant: Non-Dominant Hemiplegia - caused by: Cerebral infarction                Time: 0071-2197 OT Time Calculation (min): 26 min Charges:  OT General Charges $OT Visit: 1 Visit OT Evaluation $OT Eval Moderate Complexity: 1 Mod OT Treatments $Self Care/Home Management : 8-22 mins G-Codes: OT G-codes **NOT FOR INPATIENT CLASS** Functional Assessment Tool Used: Clinical judgement Functional Limitation: Self care Self Care Current Status (J8832): At least 60 percent but less than 80 percent impaired, limited or restricted Self Care Goal Status (P4982): At least 20 percent but less than 40 percent impaired, limited or restricted   [place], OTR/L Acute Rehab Pager: [phone] Office: [place] 05/04/2017, 1:03 PM

## 2017-05-04 NOTE — Progress Notes (Signed)
CM received consult :  Question Answer Comment  Reason for consult: Other (see comments)       Process Instructions   For Ordering Provider: For skilled nursing facility placement or assisted living placement please order a consult to social work.   Per PT's evaluation/ recommendation: CIR. Rehab consult pending for inpatient rehab..... CM following for disposition needs. Whitman Hero RN,BSN,CM

## 2017-05-04 NOTE — Evaluation (Signed)
Physical Therapy Evaluation Patient Details Name: Kelly Lowe MRN: [id number] DOB: 03-01-1951 Today's Date: 05/04/2017   History of Present Illness  66 y.o. female with a history of HTN and tobacco abuse who presents to the ED with L sided weakness that started this morning. 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  Clinical Impression   Pt admitted with above diagnosis. Pt currently with functional limitations due to the deficits listed below (see PT Problem List). Presents with L sided weakness, R gaze preference effects functional mobility;  Pt will benefit from skilled PT to increase their independence and safety with mobility to allow discharge to the venue listed below.       Follow Up Recommendations CIR    Equipment Recommendations  Other (comment) (to be determined at Rehab)    Recommendations for Other Services Rehab consult     Precautions / Restrictions Precautions Precautions: Fall Precaution Comments: R gaze preference Restrictions Weight Bearing Restrictions: Yes RLE Weight Bearing: Weight bearing as tolerated (in wlaking boot) Other Position/Activity Restrictions: Must be in boot for any weight bearing      Mobility  Bed Mobility Overal bed mobility: Needs Assistance Bed Mobility: Supine to Sit     Supine to sit: Mod assist     General bed mobility comments: Cues for technique; Heavy mod assist to help LLE off of bed; Heavy mod assist to elevate trunk to sit; Pt initiated reciprocal scooting at EOB; R hip scoots more than L; required safety cues to stop R scooting when she got too close to the edge  Transfers Overall transfer level: Needs assistance Equipment used: 1 person hand held assist (support given at L elbow and R gait belt) Transfers: Sit to/from [employer] Transfers Sit to Stand: Mod assist Stand pivot transfers: Max assist        General transfer comment: Mod assist to power up; Very nice weight bearing through LLE with no buckling noted today during brief period standing; Max assist to weight shift, turn and control descent of sit to chair  Ambulation/Gait                Stairs            Wheelchair Mobility    Modified Rankin (Stroke Patients Only) Modified Rankin (Stroke Patients Only) Pre-Morbid Rankin Score: No significant disability Modified Rankin: Severe disability     Balance Overall balance assessment: Needs assistance Sitting-balance support: Feet supported;Single extremity supported Sitting balance-Leahy Scale: Poor     Standing balance support: During functional activity Standing balance-Leahy Scale: Poor                               Pertinent Vitals/Pain Pain Assessment: No/denies pain Faces Pain Scale: No hurt Pain Location: back Pain Descriptors / Indicators: Aching Pain Intervention(s): Premedicated before session;Monitored during session    Home Living Family/patient expects to be discharged to:: Inpatient rehab Living Arrangements: Alone               Additional Comments: Daughters in room plan to arrange for pt to stay with one of them    Prior Function Level of Independence: Independent with assistive device(s)         Comments: Broke two metatarsals in R foot a few weeks ago; must be in boot to stand/walk     Hand Dominance   Dominant Hand: Right  Extremity/Trunk Assessment   Upper Extremity Assessment Upper Extremity Assessment: Defer to OT evaluation    Lower Extremity Assessment Lower Extremity Assessment: LLE deficits/detail LLE Deficits / Details: Hip flexion 3-/5, quad 2+/5, ankle dorsiflexion 1/5; Stood today briefly with no LLE buckling noted       Communication   Communication: Other (comment) (Slow to answer questions)  Cognition Arousal/Alertness: Awake/alert Behavior During Therapy: WFL for tasks  assessed/performed Overall Cognitive Status: Impaired/Different from baseline Area of Impairment: Attention;Problem solving                   Current Attention Level:  (L inattention)         Problem Solving: Slow processing;Difficulty sequencing;Requires verbal cues;Requires tactile cues        General Comments General comments (skin integrity, edema, etc.): Daughters present and educated in helping Kelly Lowe interact with her L side, stimulate from L; we discussed dc options and recommendation for CIR; They are looking into Kelly Lowe staying with one of them post rehab    Exercises     Assessment/Plan    PT Assessment Patient needs continued PT services  PT Problem List Decreased strength;Decreased range of motion;Decreased activity tolerance;Decreased balance;Decreased mobility;Decreased coordination;Decreased cognition;Decreased knowledge of use of DME;Decreased safety awareness;Decreased knowledge of precautions;Impaired tone;Impaired sensation       PT Treatment Interventions DME instruction;Gait training;Functional mobility training;Therapeutic activities;Therapeutic exercise;Balance training;Neuromuscular re-education;Cognitive remediation;Patient/family education    PT Goals (Current goals can be found in the Care Plan section)  Acute Rehab PT Goals Patient Stated Goal: wants coffee PT Goal Formulation: With patient Time For Goal Achievement: 05/18/17 Potential to Achieve Goals: Good    Frequency Min 4X/week   Barriers to discharge        Co-evaluation               AM-PAC PT "6 Clicks" Daily Activity  Outcome Measure Difficulty turning over in bed (including adjusting bedclothes, sheets and blankets)?: Unable Difficulty moving from lying on back to sitting on the side of the bed? : Unable Difficulty sitting down on and standing up from a chair with arms (e.g., wheelchair, bedside commode, etc,.)?: Unable Help needed moving to and from a  bed to chair (including a wheelchair)?: A Lot Help needed walking in hospital room?: A Lot Help needed climbing 3-5 steps with a railing? : Total 6 Click Score: 8    End of Session Equipment Utilized During Treatment: Gait belt Activity Tolerance: Patient tolerated treatment well Patient left: in chair;with call bell/phone within reach Nurse Communication: Mobility status;Other (comment) (Need to replace Purewick) PT Visit Diagnosis: Hemiplegia and hemiparesis Hemiplegia - Right/Left: Left Hemiplegia - dominant/non-dominant: Non-dominant Hemiplegia - caused by: Cerebral infarction    Time: 8366-2947 PT Time Calculation (min) (ACUTE ONLY): 33 min   Charges:   PT Evaluation $PT Eval Moderate Complexity: 1 Mod PT Treatments $Therapeutic Activity: 8-22 mins   PT G Codes:   PT G-Codes **NOT FOR INPATIENT CLASS** Functional Assessment Tool Used: Clinical judgement Functional Limitation: Mobility: Walking and moving around Mobility: Walking and Moving Around Current Status (M5465): At least 60 percent but less than 80 percent impaired, limited or restricted Mobility: Walking and Moving Around Goal Status [phone]): At least 1 percent but less than 20 percent impaired, limited or restricted    Roney Marion, PT  [place] Pager [phone] Office [place] 05/04/2017, 11:22 AM

## 2017-05-04 NOTE — Progress Notes (Signed)
STROKE TEAM PROGRESS NOTE   HISTORY OF PRESENT ILLNESS (per record) Kelly Lowe is a 66 y.o. female last in her normal state of health last night around 11 which went to bed.. She awoke with left-sided weakness. Due to the presence of gaze deviation and weakness, code stroke was activated and she was taken for CT perfusion/angiogram. This was negative for large vessel occlusion.   LKW: Last night prior to bed tpa given?: no, outside of window   SUBJECTIVE (INTERVAL HISTORY) Her family is at bedside. Reviewed imaging with family, answered questions.   OBJECTIVE Temp:  [97.7 F (36.5 C)-98.1 F (36.7 C)] 97.7 F (36.5 C) (09/30 0618) Pulse Rate:  [50-65] 65 (09/30 0618) Cardiac Rhythm: Sinus bradycardia (09/30 0700) Resp:  [18-19] 19 (09/30 0618) BP: (97-134)/(48-71) 119/55 (09/30 0618) SpO2:  [96 %-97 %] 96 % (09/30 0618) Weight:  [193 lb 8 oz (87.8 kg)] 193 lb 8 oz (87.8 kg) (09/29 1914)  CBC:   Recent Labs Lab 05/03/17 0824 05/03/17 0839 05/04/17 0535  WBC 7.8  --  5.2  NEUTROABS 5.9  --   --   HGB 12.3 12.9 10.7*  HCT 37.0 38.0 32.7*  MCV 99.2  --  98.2  PLT 212  --  283    Basic Metabolic Panel:   Recent Labs Lab 05/03/17 0824 05/03/17 0839 05/04/17 0535  NA 139 141 141  K 4.3 4.3 3.3*  CL 105 107 109  CO2 25  --  23  GLUCOSE 149* 149* 102*  BUN 12 18 11   CREATININE 0.85 0.70 0.77  CALCIUM 9.5  --  9.2    Lipid Panel:     Component Value Date/Time   CHOL 174 05/04/2017 0535   TRIG 153 (H) 05/04/2017 0535   HDL 29 (L) 05/04/2017 0535   CHOLHDL 6.0 05/04/2017 0535   VLDL 31 05/04/2017 0535   LDLCALC 114 (H) 05/04/2017 0535   HgbA1c:  Lab Results  Component Value Date   HGBA1C 5.4 05/04/2017   Urine Drug Screen:     Component Value Date/Time   LABOPIA NONE DETECTED 05/03/2017 1222   COCAINSCRNUR NONE DETECTED 05/03/2017 1222   LABBENZ NONE DETECTED 05/03/2017 1222   AMPHETMU NONE DETECTED 05/03/2017 1222   THCU NONE DETECTED  05/03/2017 1222   LABBARB NONE DETECTED 05/03/2017 1222    Alcohol Level     Component Value Date/Time   ETH <10 05/03/2017 0824    IMAGING  Ct Head Code Stroke Wo Contrast 05/03/2017 IMPRESSION:  1. Age indeterminate right caudate head infarction with a lenticulostriate pattern. ASPECTS is 9.  2. Negative for acute hemorrhage.  3. Chronic small vessel ischemia.    Ct Angio Head W Or Wo Contrast Ct Angio Neck W Or Wo Contrast Ct Cerebral Perfusion W Contrast 05/03/2017 1. Negative cerebral perfusion imaging.  2. No emergent large vessel occlusion.  3. Intracranial atherosclerosis with most notable involvement of right greater than left PCA branch vessels. No flow limiting proximal stenosis.  4. Widely patent cervical carotid and vertebral arteries.    Mr Brain Wo Contrast 05/03/2017 IMPRESSION:  1. 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  2. Additional punctate ischemic infarct within the left splenium.  3. Generalized age-related cerebral atrophy with underlying moderate chronic microvascular ischemic disease.    Dg Lumbar Spine Complete 05/03/2017 IMPRESSION: No evidence of acute lumbar spine injury.   Facet hypertrophy noted.    Dg Hips Bilat With Pelvis 3-4 Views  05/03/2017 IMPRESSION:  No hip fracture or dislocation.     PHYSICAL EXAM Vitals:   05/03/17 2300 05/04/17 0206 05/04/17 0400 05/04/17 0618  BP: (!) 107/57 (!) 107/51 (!) 97/48 (!) 119/55  Pulse: (!) 50   65  Resp:    19  Temp:    97.7 F (36.5 C)  TempSrc:    Oral  SpO2: 96%   96%  Weight:      Height:       PHYSICAL EXAM Physical exam: Exam: Gen: NAD Eyes: anicteric sclerae, moist conjunctivae                    CV: no MRG, no carotid bruits, no peripheral edema Mental Status: Alert, follows commands, good historian  Neuro: Detailed Neurologic Exam  Speech:    No aphasia, no dysarthria  Cranial Nerves:    The pupils are equal, round, and  reactive to light.. Attempted, Fundi not visualized.  EOMI. Right gaze preference but can cross the midline, can track, blinks on the left, does not blink to threat on the right, right facial droop, face sensation intact, Tongue and uvula midline. Hearing intact to voice. Shoulder shrug intact  Motor Observation:    no involuntary movements noted. Tone appears normal.     Strength:    Left hemiplegia     Sensation:  Intact to LT  Plantars downgoing.    ASSESSMENT/PLAN [name] is a 66 y.o. female with history of hypertension presenting with left sided weakness. She did not receive IV t-PA due to late presentation.  Stroke:  right basal ganglia infarct - additional punctate infarct within the left splenium - embolic unknown source but reports recent right leg calf swelling and immobility due to right leg injury  Resultant  Hemiplegia, facial droop, gaze preference, right neglect  CT head - Age indeterminate right caudate head infarction  MRI head - Ischemic nonhemorrhagic right basal ganglia infarct. Ischemic infarct within the left splenium.   MRA head - not performed.  CTA H&N / Perfusion - Negative cerebral perfusion imaging. No emergent large vessel occlusion.  Carotid Doppler - CTA neck  2D Echo - pending, May need TEE and loop  B LE Venous Dopplers - R/O DVT due to immobility and report of right calf swelling- pending  LDL - 114  HgbA1c - 5.4  VTE prophylaxis - [place] Heparin DIET - DYS 1 Room service appropriate? Yes; Fluid consistency: Thin  No antithrombotic prior to admission, now on aspirin 325 mg daily  Patient counseled to be compliant with her antithrombotic medications  Ongoing aggressive stroke risk factor management  Therapy recommendations: CIR recommended.  Disposition: Pending  Hypertension  Blood pressure tends to run low. (no anti-htn meds)  Permissive hypertension (OK if < 220/120) but gradually normalize in 5-7 days  Long-term  BP goal normotensive  Hyperlipidemia  Home meds:  No lipid lowering medications prior to admission  LDL 114, goal < 70  Now on Lipitor 40 mg daily.  Continue statin at discharge  Diabetes  HgbA1c 5.4, goal < 7.0  Controlled  Other Stroke Risk Factors  Advanced age  Cigarette smoker - advised to stop smoking  ETOH use, advised to drink no more than 1 drink per day.  Obesity, Body mass index is 30.31 kg/m., recommend weight loss, diet and exercise as appropriate    Other Active Problems  Hypokalemia - supplement has been ordered.  Anemia  Hospital day # 0  Personally examined patient  and images, performed neuro exam,assessment and plan as stated above.  I have personally obtained the history, evaluated lab date, reviewed imaging studies and agree with radiology interpretations.    Sarina Ill, MD Stroke Neurology  To contact Stroke Continuity provider, please refer to [email or web address] After hours, contact General Neurology

## 2017-05-05 ENCOUNTER — Inpatient Hospital Stay (HOSPITAL_COMMUNITY): Payer: 59

## 2017-05-05 DIAGNOSIS — D62 Acute posthemorrhagic anemia: Secondary | ICD-10-CM

## 2017-05-05 DIAGNOSIS — I361 Nonrheumatic tricuspid (valve) insufficiency: Secondary | ICD-10-CM

## 2017-05-05 DIAGNOSIS — I6300 Cerebral infarction due to thrombosis of unspecified precerebral artery: Secondary | ICD-10-CM

## 2017-05-05 DIAGNOSIS — I340 Nonrheumatic mitral (valve) insufficiency: Secondary | ICD-10-CM

## 2017-05-05 DIAGNOSIS — Z720 Tobacco use: Secondary | ICD-10-CM

## 2017-05-05 DIAGNOSIS — E876 Hypokalemia: Secondary | ICD-10-CM

## 2017-05-05 DIAGNOSIS — I10 Essential (primary) hypertension: Secondary | ICD-10-CM

## 2017-05-05 DIAGNOSIS — R52 Pain, unspecified: Secondary | ICD-10-CM

## 2017-05-05 DIAGNOSIS — M79609 Pain in unspecified limb: Secondary | ICD-10-CM

## 2017-05-05 DIAGNOSIS — I639 Cerebral infarction, unspecified: Secondary | ICD-10-CM

## 2017-05-05 LAB — ECHOCARDIOGRAM COMPLETE
Height: 67 in
Weight: 3096 oz

## 2017-05-05 LAB — BASIC METABOLIC PANEL
Anion gap: 10 (ref 5–15)
BUN: 11 mg/dL (ref 6–20)
CHLORIDE: 110 mmol/L (ref 101–111)
CO2: 18 mmol/L — ABNORMAL LOW (ref 22–32)
Calcium: 9.3 mg/dL (ref 8.9–10.3)
Creatinine, Ser: 0.79 mg/dL (ref 0.44–1.00)
GFR calc Af Amer: >60 mL/min (ref >60)
Glucose, Bld: 81 mg/dL (ref 65–99)
Potassium: 3.9 mmol/L (ref 3.5–5.1)
SODIUM: 138 mmol/L (ref 135–145)

## 2017-05-05 LAB — GLUCOSE, CAPILLARY: Glucose-Capillary: 104 mg/dL — ABNORMAL HIGH (ref 65–99)

## 2017-05-05 MED ORDER — PREMIER PROTEIN SHAKE
2.0000 [oz_av] | Freq: Four times a day (QID) | ORAL | Status: DC
  Administered 2017-05-05 – 2017-05-06 (×3): 2 [oz_av] via ORAL
  Filled 2017-05-05 (×10): qty 325.31

## 2017-05-05 NOTE — Progress Notes (Signed)
*  PRELIMINARY RESULTS* Vascular Ultrasound Bialteral lower extremity venous duplex has been completed.  Preliminary findings: Small focal area of intermuscular thrombus in the mid right gastrocnemius vein, other visualized veins of the lower extremities appear negative for deep vein thrombosis. Negative for bakers cysts bilaterally.  Myrtie Cruise Edras Wilford 05/05/2017, 11:26 AM

## 2017-05-05 NOTE — Progress Notes (Signed)
STROKE TEAM PROGRESS NOTE   HISTORY OF PRESENT ILLNESS (per record) Kelly Lowe is a 66 y.o. female last in her normal state of health last night around 11 which went to bed.. She awoke with left-sided weakness. Due to the presence of gaze deviation and weakness, code stroke was activated and she was taken for CT perfusion/angiogram. This was negative for large vessel occlusion.   LKW: Last night prior to bed tpa given?: no, outside of window   SUBJECTIVE (INTERVAL HISTORY) Her 2 daughters are at bedside. Reviewed imaging with family, answered questions.   OBJECTIVE Temp:  [97.6 F (36.4 C)-99.7 F (37.6 C)] 98.4 F (36.9 C) (10/01 0540) Pulse Rate:  [55-76] 55 (10/01 0540) Cardiac Rhythm: Sinus bradycardia (10/01 0700) Resp:  [17-19] 17 (10/01 0540) BP: (133-147)/(63-74) 147/63 (10/01 0540) SpO2:  [96 %-98 %] 98 % (10/01 0540)  CBC:   Recent Labs Lab 05/03/17 0824 05/03/17 0839 05/04/17 0535  WBC 7.8  --  5.2  NEUTROABS 5.9  --   --   HGB 12.3 12.9 10.7*  HCT 37.0 38.0 32.7*  MCV 99.2  --  98.2  PLT 212  --  106    Basic Metabolic Panel:   Recent Labs Lab 05/04/17 0535 05/05/17 0623  NA 141 138  K 3.3* 3.9  CL 109 110  CO2 23 18*  GLUCOSE 102* 81  BUN 11 11  CREATININE 0.77 0.79  CALCIUM 9.2 9.3    Lipid Panel:     Component Value Date/Time   CHOL 174 05/04/2017 0535   TRIG 153 (H) 05/04/2017 0535   HDL 29 (L) 05/04/2017 0535   CHOLHDL 6.0 05/04/2017 0535   VLDL 31 05/04/2017 0535   LDLCALC 114 (H) 05/04/2017 0535   HgbA1c:  Lab Results  Component Value Date   HGBA1C 5.4 05/04/2017   Urine Drug Screen:     Component Value Date/Time   LABOPIA NONE DETECTED 05/03/2017 1222   COCAINSCRNUR NONE DETECTED 05/03/2017 1222   LABBENZ NONE DETECTED 05/03/2017 1222   AMPHETMU NONE DETECTED 05/03/2017 1222   THCU NONE DETECTED 05/03/2017 1222   LABBARB NONE DETECTED 05/03/2017 1222    Alcohol Level     Component Value Date/Time   ETH  <10 05/03/2017 0824    IMAGING  Ct Head Code Stroke Wo Contrast 05/03/2017 IMPRESSION:  1. Age indeterminate right caudate head infarction with a lenticulostriate pattern. ASPECTS is 9.  2. Negative for acute hemorrhage.  3. Chronic small vessel ischemia.    Ct Angio Head W Or Wo Contrast Ct Angio Neck W Or Wo Contrast Ct Cerebral Perfusion W Contrast 05/03/2017 1. Negative cerebral perfusion imaging.  2. No emergent large vessel occlusion.  3. Intracranial atherosclerosis with most notable involvement of right greater than left PCA branch vessels. No flow limiting proximal stenosis.  4. Widely patent cervical carotid and vertebral arteries.    Mr Brain Wo Contrast 05/03/2017 IMPRESSION:  1. 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  2. Additional punctate ischemic infarct within the left splenium.  3. Generalized age-related cerebral atrophy with underlying moderate chronic microvascular ischemic disease.    Dg Lumbar Spine Complete 05/03/2017 IMPRESSION: No evidence of acute lumbar spine injury.   Facet hypertrophy noted.    Dg Hips Bilat With Pelvis 3-4 Views 05/03/2017 IMPRESSION:  No hip fracture or dislocation.     PHYSICAL EXAM Vitals:   05/04/17 1427 05/04/17 2213 05/05/17 0413 05/05/17 0540  BP: 133/68 Marland Kitchen)  143/74 (!) 145/63 (!) 147/63  Pulse: 60 76 65 (!) 55  Resp: 19 17 17 17   Temp: 97.6 F (36.4 C) 99.7 F (37.6 C) 99.3 F (37.4 C) 98.4 F (36.9 C)  TempSrc: Oral Axillary Oral Oral  SpO2: 98% 97% 96% 98%  Weight:      Height:       PHYSICAL EXAM Physical exam: Exam: frail elderly lady not in distress Gen: NAD Eyes: anicteric sclerae, moist conjunctivae                    CV: no MRG, no carotid bruits, no peripheral edema Mental Status: Alert, follows commands, good historian  Neuro: Detailed Neurologic Exam  Speech:    No aphasia, no dysarthria  Cranial Nerves:    The pupils are equal, round, and  reactive to light.. Attempted, Fundi not visualized.  EOMI. Right gaze preference but can cross the midline, can track, blinks on the left, does not blink to threat on the right, right facial droop, face sensation intact, Tongue and uvula midline. Hearing intact to voice. Shoulder shrug intact  Motor Observation:    no involuntary movements noted. Tone appears normal.     Strength:    Left  Dense flaccid hemiplegia 0/5. Normal strength on the right     Sensation:  Intact to LT but slight left hemi-neglect  Plantars downgoing.    ASSESSMENT/PLAN Ms. PRICSILLA Lowe is a 66 y.o. female with history of hypertension presenting with left sided weakness. She did not receive IV t-PA due to late presentation.  Stroke:  right basal ganglia infarct - additional punctate infarct within the left splenium - embolic unknown source but reports recent right leg calf swelling and immobility due to right leg injury  Resultant  Hemiplegia, facial droop, gaze preference, right neglect  CT head - Age indeterminate right caudate head infarction  MRI head - Ischemic nonhemorrhagic right basal ganglia infarct. Ischemic infarct within the left splenium.   MRA head - not performed.  CTA H&N / Perfusion - Negative cerebral perfusion imaging. No emergent large vessel occlusion.  Carotid Doppler - CTA neck  2D Echo - pending, May need TEE and loop  B LE Venous Dopplers - R/O DVT due to immobility and report of right calf swelling- pending  LDL - 114  HgbA1c - 5.4  VTE prophylaxis - [place] Heparin DIET - DYS 1 Room service appropriate? Yes; Fluid consistency: Thin  No antithrombotic prior to admission, now on aspirin 325 mg daily  Patient counseled to be compliant with her antithrombotic medications  Ongoing aggressive stroke risk factor management  Therapy recommendations: CIR recommended.  Disposition: Pending  Hypertension  Blood pressure tends to run low. (no anti-htn meds)  Permissive  hypertension (OK if < 220/120) but gradually normalize in 5-7 days  Long-term BP goal normotensive  Hyperlipidemia  Home meds:  No lipid lowering medications prior to admission  LDL 114, goal < 70  Now on Lipitor 40 mg daily.  Continue statin at discharge  Diabetes  HgbA1c 5.4, goal < 7.0  Controlled  Other Stroke Risk Factors  Advanced age  Cigarette smoker - advised to stop smoking  ETOH use, advised to drink no more than 1 drink per day.  Obesity, Body mass index is 30.31 kg/m., recommend weight loss, diet and exercise as appropriate    Other Active Problems  Hypokalemia - supplement has been ordered.  Anemia  Hospital day # 1  I have personally examined this  patient, reviewed notes, independently viewed imaging studies, participated in medical decision making and plan of care.ROS completed by me personally and pertinent positives fully documented  I have made any additions or clarifications directly to the above note. She has embolic right MCA branch infarct from unidentified source. Recent foot injury and limited walking raises possibility of DVT and paradoxical embolism. Recommend check transcranial Doppler bubble study for PFO and check TEE. Long discussion with daughter is at the bedside regarding stroke evaluation and treatment plan discussion and answered questions.I spent   35 minutes in total face-to-face time with the patient, more than 50% of which was spent in counseling and coordination of care, reviewing test results, reviewing medication and discussing or reviewing the diagnosis of    , the prognosis and treatment options.    Antony Contras, MD Medical Director [facility] Pager: [phone] 05/05/2017 3:26 PM      To contact Stroke Continuity provider, please refer to [email or web address] After hours, contact General Neurology

## 2017-05-05 NOTE — Progress Notes (Signed)
   Subjective: Patient seen and examined. She states she is feeling better today and is able to lift her left leg, which is an improvement from yesterday. She is still unable to move her left foot or left hand or arm. She has no other acute complaints.   Objective:  Vital signs in last 24 hours: Vitals:   05/04/17 1427 05/04/17 2213 05/05/17 0413 05/05/17 0540  BP: 133/68 (!) 143/74 (!) 145/63 (!) 147/63  Pulse: 60 76 65 (!) 55  Resp: 19 17 17 17   Temp: 97.6 F (36.4 C) 99.7 F (37.6 C) 99.3 F (37.4 C) 98.4 F (36.9 C)  TempSrc: Oral Axillary Oral Oral  SpO2: 98% 97% 96% 98%  Weight:      Height:       General: Sitting up in chair comfortably, NAD HEENT: [place]/AT, EOMI, no scleral icterus, PERRL Cardiac: RRR, No R/M/G appreciated Pulm: normal effort, CTAB Abd: soft, non tender, non distended, BS normal Ext: extremities well perfused, no peripheral edema, patient has boot on RLE due to foot fracture Neuro: alert and oriented X3,  left facial droop noted, 0/5 left upper extremity, 1/5 LLE (mildly improved from yesterday), right gaze preference, no dysarthria, sensation is intact in all four extremities    Assessment/Plan:  Principal Problem:   CVA (cerebral vascular accident) ([place]) Active Problems:   Essential hypertension   Tobacco use   Cerebral infarction ([place])   Pain   Hypokalemia   Acute blood loss anemia  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 Head and neck CTA with no vascular abnormalities. She was outside the window for tPA therapy. Today, her exam is somewhat improved from yesterday, she is able to raise her left leg an inch or two, but is still unable to move her foot, left upper extremity and hand, continues to have right gaze preference and left sided facial droop. PMR evaluated the patient and still recommends CIR after stroke workup is  complete.  - Aspirin 81 mg QD + atorvastatin 40 mg QD  - Will allow permissive HTN up to 220/120  - Follow up TTE and bilateral LE dopplers  - On telemetry   Hypokalemia K 3.3 >>3.9 this morning -Replete as needed -BMP tomorrow   Lower back pain - IV Toradol 15 mg q8h PRN for pain   HTN On amlodipine 5 mg QD and HCTZ 12.5 mg QD at home.  - Hold antihypertensive medications and allow permissive HTN as above   Tobacco abuse Patient smokes 1/2 pack of cigarettes per day for 50 years - Nicotine patch  - Educated patient on the importance of smoking cessation  - Encouraged smoking cessation   F: none  E: will monitor and replete as needed  N: Dysphagia 1 diet   VTE ppx: SQ heparin   Code status: Full code, confirmed on admission   Dispo: Anticipated discharge in approximately 1-2 day(s) to CIR, pending completion of stroke workup.   Melanee Spry, MD 05/05/2017, 10:45 AM Pager: [phone]

## 2017-05-05 NOTE — Discharge Summary (Signed)
Name: Kelly Lowe MRN: [id number] DOB: February 23, 1951 66 y.o. PCP: Carol Ada, MD  Date of Admission: 05/03/2017  8:23 AM Date of Discharge: 05/06/2017 Attending Physician: Bartholomew Crews, MD  Discharge Diagnosis: 1. CVA  Principal Problem:   CVA (cerebral vascular accident) [name]) Active Problems:   Essential hypertension   Tobacco use   Cerebral infarction ([place])   Pain   Hypokalemia   Acute blood loss anemia   Discharge Medications: Allergies as of 05/06/2017   No Known Allergies     Medication List    TAKE these medications   acetaminophen 650 MG suppository Commonly known as:  TYLENOL Place 1 suppository (650 mg total) rectally every 6 (six) hours as needed for mild pain (or Fever >/= 101).   acetaminophen 325 MG tablet Commonly known as:  TYLENOL Take 2 tablets (650 mg total) by mouth every 6 (six) hours as needed for mild pain (or Fever >/= 101).   amLODipine 5 MG tablet Commonly known as:  NORVASC Take 5 mg by mouth daily.   aspirin 325 MG tablet Take 1 tablet (325 mg total) by mouth daily.   atorvastatin 40 MG tablet Commonly known as:  LIPITOR Take 1 tablet (40 mg total) by mouth daily at 6 PM.   BIOTIN PO Take 1 tablet by mouth daily.   hydrochlorothiazide 12.5 MG tablet Commonly known as:  HYDRODIURIL Take 12.5 mg by mouth every morning.   ibuprofen 200 MG tablet Commonly known as:  ADVIL,MOTRIN Take 200 mg by mouth every 6 (six) hours as needed for mild pain.   multivitamin with minerals Tabs tablet Take 1 tablet by mouth daily.   nicotine 14 mg/24hr patch Commonly known as:  NICODERM CQ - dosed in mg/24 hours Place 1 patch (14 mg total) onto the skin daily.   protein supplement shake Liqd Commonly known as:  PREMIER PROTEIN Take 59.1 mLs (2 oz total) by mouth 4 (four) times daily.   VITAMIN B 12 PO Take 1 tablet by mouth daily.       Disposition and follow-up:   Ms.Leidy THOMASENE DUBOW was discharged from [facility] in Stable condition.  At the hospital follow up visit please address:  1. As an inpatient during CIR please address her Transcranial doppler study and TEE results, as well as and any medication changes recommended by Neurology. At outpatient follow up please assess blood pressure and compliance with daily aspirin and statin therapy, smoking cessation, or other changes made to her medication regimen while in rehabilitation.   2.  Labs / imaging needed at time of follow-up: None   3.  Pending labs/ test needing follow-up: None   Hospital Course by problem list:    Kelly Lowe is a 66 y.o. female with a history of HTN and tobacco abuse who presented with left upper extremity and left lower extremity weakness and was found to have a right caudate CVA on CT.    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 MRI of her brain revealed R basal ganglia and L splenium infarcts. Head and neck CTA with no vascular abnormalities. She was outside the window for tPA therapy. A1c 5.4 and LDL 117. On exam, she has significant limited mobility with 0-1/5 strength on L-sided extremities. Physical therapy, Occupational therapy, and Speech therapy recommended CIR for further rehabilitation. She was educated on  the importance of tobacco cessation and compliance with daily aspirin and atorvastatin. Transthoracic Echocardiogram will be completed on 05/08/2017, and has been approved by PMR medical director to admit patient to [facility] prior to this TEE slot. Transcranial doppler study with bubbles was performed prior to discharge and did not show a PFO. She was discharged in stable condition to CIR.   Discharge Vitals:   BP 116/88 (BP Location: Right Arm)   Pulse (!) 58   Temp 98.6 F (37 C) (Oral)   Resp 17   Ht 5\' 7"  (1.702 m)   Wt 193 lb 8 oz (87.8 kg)   SpO2 100%    BMI 30.31 kg/m   Pertinent Labs, Studies, and Procedures:   Transthoracic Echocardiogram  05/05/2017  Study Conclusions: - Left ventricle: The cavity size was normal. There was mild focal   basal hypertrophy of the septum. Systolic function was vigorous.   The estimated ejection fraction was in the range of 65% to 70%.   Wall motion was normal; there were no regional wall motion   abnormalities. Left ventricular diastolic function parameters   were normal. - Aortic valve: Trileaflet; normal thickness leaflets. Valve area   (VTI): 1.84 cm^2. Valve area (Vmax): 2.17 cm^2. Valve area   (Vmean): 1.85 cm^2. - Mitral valve: Structurally normal valve. There was mild   regurgitation. - Left atrium: The atrium was normal in size. - Right ventricle: The cavity size was normal. Wall thickness was   normal. Systolic function was normal. - Right atrium: The atrium was normal in size. - Tricuspid valve: There was mild regurgitation. - Pulmonic valve: There was mild regurgitation. - Pulmonary arteries: Systolic pressure was within the normal   range. - Inferior vena cava: The vessel was normal in size. - Pericardium, extracardiac: There was no pericardial effusion.  Ct Head Code Stroke Wo Contrast 05/03/2017 IMPRESSION:  1. Age indeterminate right caudate head infarction with a lenticulostriate pattern. ASPECTS is 9.  2. Negative for acute hemorrhage.  3. Chronic small vessel ischemia.    Ct Angio Head W Or Wo Contrast Ct Angio Neck W Or Wo Contrast Ct Cerebral Perfusion W Contrast 05/03/2017 1. Negative cerebral perfusion imaging.  2. No emergent large vessel occlusion.  3. Intracranial atherosclerosis with most notable involvement of right greater than left PCA branch vessels. No flow limiting proximal stenosis.  4. Widely patent cervical carotid and vertebral arteries.    Mr Brain Wo Contrast 05/03/2017 IMPRESSION:  1. 4.4 x 2.1 x 3.2 cm acute ischemic nonhemorrhagic right  basal ganglia infarct.  2. Additional punctate ischemic infarct within the left splenium.  3. Generalized age-related cerebral atrophy with underlying moderate chronic microvascular ischemic disease.   Dg Lumbar Spine Complete 05/03/2017 IMPRESSION: No evidence of acute lumbar spine injury.   Facet hypertrophy noted.   Dg Hips Bilat With Pelvis 3-4 Views 05/03/2017 IMPRESSION:  No hip fracture or dislocation.    Signed: Arvil Chaco, MD Internal Medicine PGY1 Pager # [phone]

## 2017-05-05 NOTE — Progress Notes (Signed)
  Echocardiogram 2D Echocardiogram has been performed.  Kelly Lowe T Kelly Lowe 05/05/2017, 12:01 PM

## 2017-05-05 NOTE — Progress Notes (Signed)
Inpatient Rehabilitation  Met with patient and daughters at bedside to discuss team's recommendation for IP Rehab.  Shared booklets, insurance verification letter, and answered questions.  They are eager for IP Rehab and report that she also has Medicare coverage.  Will check on Medicare coverage and initiate insurance authorization.  Plan to follow for timing of medical readiness, insurance authorization, and IP Rehab bed availability.  Please call with questions.  Carmelia Roller., CCC/SLP Admission Coordinator  [place]  Cell [phone]

## 2017-05-05 NOTE — Progress Notes (Signed)
SLP Cancellation Note  Patient Details Name: JILLYN STACEY MRN: [id number] DOB: [birth year]   Cancelled treatment:       Reason Eval/Treat Not Completed: Patient at procedure or test/unavailable.    Germain Osgood 05/05/2017, 11:30 AM  Germain Osgood, M.A. CCC-SLP [phone]

## 2017-05-05 NOTE — Consult Note (Signed)
Physical Medicine and Rehabilitation Consult   Reason for Consult: Stroke with cognitive deficits as well as deficits in mobility and self care tasks.  Referring Physician: Dr. Lynnae January.    HPI: Kelly Lowe is a 66 y.o. female with history of HTN, right foot fractures 03/2017;  who was admitted on 05/03/17 with left sided weakness, right gaze preference and left facial droop. History taken from chart review, patient, and sister. CT head reviewed, showing right basal gangial infarct. Per report, age indeterminate right caudate infarct and CTA head/neck revealed intracranial atherosclerosis involving R> L PCA. MRI brain done revealing  Non-hemorrhagic infarct in right basal ganglia, punctate infarct in left splenium and moderate underlying small vessel disease.  Hip films done due to reports of fall and back pain and showed DDD and no hip fracture or dislocation. Work up ongoing and therapy done yesterday revealing deficits in mobility and self care tasks due to dense left hemiparesis with sensory deficits, right gaze preference, decrease awareness of deficits with deficits in problems solving. CIR recommended for follow up therapy.    Review of Systems  HENT: Negative for hearing loss and tinnitus.   Eyes: Negative for blurred vision and double vision.  Respiratory: Negative for cough and shortness of breath.   Cardiovascular: Negative for chest pain and palpitations.  Gastrointestinal: Negative for heartburn and nausea.  Genitourinary: Negative for dysuria.  Musculoskeletal: Negative for back pain, joint pain and myalgias.  Neurological: Positive for sensory change, speech change, focal weakness and weakness. Negative for dizziness and headaches.  Psychiatric/Behavioral: The patient has insomnia.   All other systems reviewed and are negative.     Past Medical History:  Diagnosis Date  . Hypertension     Past Surgical History:  Procedure Laterality Date  . FOOT SURGERY        No pertinent family history of premature stroke.    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 She  reports that she has been smoking Cigarettes-- 1/2 PPD.   She has a 12.50 pack-year smoking history. She has never used smokeless tobacco. She reports that she drinks about a glass of wine on special occasions/holidays.  She reports that she does not use drugs.    Allergies: No Known Allergies    Medications Prior to Admission  Medication Sig Dispense Refill  . amLODipine (NORVASC) 5 MG tablet Take 5 mg by mouth daily.  1  . BIOTIN PO Take 1 tablet by mouth daily.    . Cyanocobalamin (VITAMIN B 12 PO) Take 1 tablet by mouth daily.    . hydrochlorothiazide (HYDRODIURIL) 12.5 MG tablet Take 12.5 mg by mouth every morning.  1  . ibuprofen (ADVIL,MOTRIN) 200 MG tablet Take 200 mg by mouth every 6 (six) hours as needed for mild pain.    . Multiple Vitamin (MULTIVITAMIN WITH MINERALS) TABS tablet Take 1 tablet by mouth daily.      Home: Home Living Family/patient expects to be discharged to:: Inpatient rehab Living Arrangements: Alone Additional Comments: Daughters in room plan to arrange for pt to stay with one of them  Functional History: Prior Function Level of Independence: Independent with assistive device(s) Comments: ADLs and IADLs. Broke R foot a few weeks ago; must be in boot to stand/walk Functional Status:  Mobility: Bed Mobility Overal bed mobility: Needs Assistance Bed Mobility: Supine to Sit Supine to sit: Mod assist General bed mobility comments: In recliner upon arrival Transfers Overall transfer level:  Needs assistance Equipment used: 1 person hand held assist (support given at L elbow and R gait belt) Transfers: Sit to/from Stand, Stand Pivot Transfers Sit to Stand: Max assist Stand pivot transfers: Max assist General transfer comment: Max assist to power up from recliner. Pt with L lateral lean during standing      ADL: ADL Overall  ADL's : Needs assistance/impaired Eating/Feeding: Moderate assistance, Sitting Eating/Feeding Details (indicate cue type and reason): Pt fixed her coffee while sitting in reclienr with support. Pt requiring Max hand over hand assist with use L hand for opening surgar packets and performing bialteral coordination tasks.  Grooming: Moderate assistance, Sitting Grooming Details (indicate cue type and reason): supported sitting Upper Body Bathing: Moderate assistance, Sitting Lower Body Bathing: Maximal assistance, +2 for physical assistance, Sit to/from stand Upper Body Dressing : Moderate assistance, Sitting Lower Body Dressing: Maximal assistance, +2 for physical assistance, Sit to/from stand Functional mobility during ADLs: Maximal assistance (sit<>stand only) General ADL Comments: Pt demosntrating decreased functional performance. Pt requiring Max hand over hand to use LUE during ADLs. Pt requiring Max A for sit<>stand. Educated family and pt on sensory deficits and to be cautious with hot/cold; pt wanting to drink her coffee.   Cognition: Cognition Overall Cognitive Status: Impaired/Different from baseline Orientation Level: Oriented X4 Cognition Arousal/Alertness: Awake/alert Behavior During Therapy: WFL for tasks assessed/performed Overall Cognitive Status: Impaired/Different from baseline Area of Impairment: Attention, Problem solving, Following commands Current Attention Level: Selective (L inattention) Following Commands: Follows one step commands with increased time, Follows multi-step commands with increased time, Follows multi-step commands inconsistently Problem Solving: Slow processing, Difficulty sequencing, Requires verbal cues, Requires tactile cues General Comments: Pt able to recall 3/3 words for ST memory testing. Pt abel to name five animals with minimal increased time. Pt requiring Max cues to perform simple math problem and demosntrating difficulty with multi-step  tasks  Blood pressure (!) 147/63, pulse (!) 55, temperature 98.4 F (36.9 C), temperature source Oral, resp. rate 17, height 5\' 7"  (1.702 m), weight 87.8 kg (193 lb 8 oz), SpO2 98 %. Physical Exam  Nursing note and vitals reviewed. Constitutional: She is oriented to person, place, and time. She appears well-developed and well-nourished.  Up in chair slumped to the right with right gaze preference.   HENT:  Head: Normocephalic and atraumatic.  Eyes: Pupils are equal, round, and reactive to light. Conjunctivae are normal.  Needs max cues to turn eyes to the left.  Neck: Normal range of motion. Neck supple.  Cardiovascular: Normal rate and regular rhythm.   Respiratory: Effort normal and breath sounds normal. No stridor. She has no wheezes.  GI: Soft. Bowel sounds are normal. She exhibits no distension. There is no tenderness.  Musculoskeletal: She exhibits no edema or tenderness.  CAM boot on right foot.   Neurological: She is alert and oriented to person, place, and time.  Left facial weakness with tongue deviation to the left.  Mild dysarthria.  Right gaze preference and needed max cues to move eyes to left field.  Motor: RUE/RLE: 5/5 proximal to distal (except for ankle in boot) LUE: 0/5 proximal to distal LLE: HF 1/5, distally 0/5 Sensation diminished to light touch LUE/LLE  Skin: Skin is warm and dry.  Psychiatric: Her affect is blunt. Her speech is delayed. She is slowed.    No results found for this or any previous visit (from the past 24 hour(s)). Ct Angio Head W Or Wo Contrast  Result Date: 05/03/2017 CLINICAL DATA:  Left  facial droop.  Right gaze. EXAM: CT ANGIOGRAPHY HEAD AND NECK CT PERFUSION BRAIN TECHNIQUE: Multidetector CT imaging of the head and neck was performed using the standard protocol during bolus administration of intravenous contrast. Multiplanar CT image reconstructions and MIPs were obtained to evaluate the vascular anatomy. Carotid stenosis measurements  (when applicable) are obtained utilizing NASCET criteria, using the distal internal carotid diameter as the denominator. Multiphase CT imaging of the brain was performed following IV bolus contrast injection. Subsequent parametric perfusion maps were calculated using RAPID software. CONTRAST:  90 mL Isovue 370 COMPARISON:  Noncontrast head CT earlier today FINDINGS: CTA NECK FINDINGS Aortic arch: Standard 3 vessel aortic arch with mild atherosclerotic plaque. Widely patent brachiocephalic and subclavian arteries. Right carotid system: Patent without evidence of stenosis or dissection. Left carotid system: Patent without evidence of stenosis or dissection. Vertebral arteries: Patent without evidence of stenosis or dissection. The right vertebral artery is slightly larger than the left. Skeleton: No fracture or suspicious osseous lesion. Other neck: Mild thyroid heterogeneity without sizable discrete nodule. Upper chest: Minimal atelectasis in the visualized lungs. Review of the MIP images confirms the above findings CTA HEAD FINDINGS Anterior circulation: The internal carotid arteries are patent from skullbase to carotid termini without stenosis. There is minimal right cavernous segment atherosclerosis. ACAs and MCAs are patent without evidence of proximal branch occlusion or significant proximal stenosis. The left A1 segment is moderately dominant. No aneurysm. Posterior circulation: The intracranial vertebral arteries are patent to the basilar with the right being mildly dominant. There is mild V4 segment atherosclerosis and irregularity without significant stenosis. Patent bilateral PICA, left AICA, and bilateral SCA origins are identified. The basilar artery is widely patent. Posterior communicating arteries are diminutive or absent. PCAs are patent with moderate right and mild left P3 and more distal branch vessel irregularity and narrowing but no significant proximal stenosis. No aneurysm. Venous sinuses: As  permitted by contrast timing, patent. Anatomic variants: None. Delayed phase: Not performed. Review of the MIP images confirms the above findings CT Brain Perfusion Findings: CBF (<30%) Volume:  0 mL Perfusion (Tmax>6.0s) volume:  0 mL Mismatch Volume: n/a Infarction Location: n/a IMPRESSION: 1. Negative cerebral perfusion imaging. 2. No emergent large vessel occlusion. 3. Intracranial atherosclerosis with most notable involvement of right greater than left PCA branch vessels. No flow limiting proximal stenosis. 4. Widely patent cervical carotid and vertebral arteries. Preliminary findings of negative perfusion imaging and no emergent large vessel occlusion were communicated in person to Dr. Leonel Ramsay on 05/03/2017 at 9:20 a.m. Electronically Signed   By: Logan Bores M.D.   On: 05/03/2017 09:41   Dg Lumbar Spine Complete  Result Date: 05/03/2017 CLINICAL DATA:  Low back pain since falling this morning. EXAM: LUMBAR SPINE - COMPLETE 4+ VIEW COMPARISON:  None. FINDINGS: Five lumbar type vertebral bodies. There is a minimal convex right scoliosis. The lateral alignment is normal. The disc spaces are preserved. Facet degenerative changes are present inferiorly. There is no evidence of acute fracture or pars defect. There is contrast material in the bladder from preceding CT scan. Aortic atherosclerosis noted. IMPRESSION: No evidence of acute lumbar spine injury.  Facet hypertrophy noted. Electronically Signed   By: Richardean Sale M.D.   On: 05/03/2017 16:43   Ct Angio Neck W Or Wo Contrast  Result Date: 05/03/2017 CLINICAL DATA:  Left facial droop.  Right gaze. EXAM: CT ANGIOGRAPHY HEAD AND NECK CT PERFUSION BRAIN TECHNIQUE: Multidetector CT imaging of the head and neck was performed using the  standard protocol during bolus administration of intravenous contrast. Multiplanar CT image reconstructions and MIPs were obtained to evaluate the vascular anatomy. Carotid stenosis measurements (when applicable) are  obtained utilizing NASCET criteria, using the distal internal carotid diameter as the denominator. Multiphase CT imaging of the brain was performed following IV bolus contrast injection. Subsequent parametric perfusion maps were calculated using RAPID software. CONTRAST:  90 mL Isovue 370 COMPARISON:  Noncontrast head CT earlier today FINDINGS: CTA NECK FINDINGS Aortic arch: Standard 3 vessel aortic arch with mild atherosclerotic plaque. Widely patent brachiocephalic and subclavian arteries. Right carotid system: Patent without evidence of stenosis or dissection. Left carotid system: Patent without evidence of stenosis or dissection. Vertebral arteries: Patent without evidence of stenosis or dissection. The right vertebral artery is slightly larger than the left. Skeleton: No fracture or suspicious osseous lesion. Other neck: Mild thyroid heterogeneity without sizable discrete nodule. Upper chest: Minimal atelectasis in the visualized lungs. Review of the MIP images confirms the above findings CTA HEAD FINDINGS Anterior circulation: The internal carotid arteries are patent from skullbase to carotid termini without stenosis. There is minimal right cavernous segment atherosclerosis. ACAs and MCAs are patent without evidence of proximal branch occlusion or significant proximal stenosis. The left A1 segment is moderately dominant. No aneurysm. Posterior circulation: The intracranial vertebral arteries are patent to the basilar with the right being mildly dominant. There is mild V4 segment atherosclerosis and irregularity without significant stenosis. Patent bilateral PICA, left AICA, and bilateral SCA origins are identified. The basilar artery is widely patent. Posterior communicating arteries are diminutive or absent. PCAs are patent with moderate right and mild left P3 and more distal branch vessel irregularity and narrowing but no significant proximal stenosis. No aneurysm. Venous sinuses: As permitted by contrast  timing, patent. Anatomic variants: None. Delayed phase: Not performed. Review of the MIP images confirms the above findings CT Brain Perfusion Findings: CBF (<30%) Volume:  0 mL Perfusion (Tmax>6.0s) volume:  0 mL Mismatch Volume: n/a Infarction Location: n/a IMPRESSION: 1. Negative cerebral perfusion imaging. 2. No emergent large vessel occlusion. 3. Intracranial atherosclerosis with most notable involvement of right greater than left PCA branch vessels. No flow limiting proximal stenosis. 4. Widely patent cervical carotid and vertebral arteries. Preliminary findings of negative perfusion imaging and no emergent large vessel occlusion were communicated in person to Dr. Leonel Ramsay on 05/03/2017 at 9:20 a.m. Electronically Signed   By: Logan Bores M.D.   On: 05/03/2017 09:41   Mr Brain Wo Contrast  Result Date: 05/03/2017 CLINICAL DATA:  Initial evaluation for acute left-sided weakness. EXAM: MRI HEAD WITHOUT CONTRAST TECHNIQUE: Multiplanar, multiecho pulse sequences of the brain and surrounding structures were obtained without intravenous contrast. COMPARISON:  Prior CT from earlier same day. FINDINGS: Brain: Acute right basal ganglia infarct measuring 4.4 x 2.1 x 3.2 cm (series 3, image 29). No associated mass effect or hemorrhage. Additional possible punctate ischemic infarct within the left splenium noted as well (series 3, image 29). No other evidence for acute or subacute infarct. Generalized age-related cerebral atrophy. Patchy and confluent T2/FLAIR hyperintensity within the periventricular and deep white matter both cerebral hemispheres, moderate nature. Remote lacunar infarcts noted within the right basal ganglia and right thalamus. Susceptibility artifact within the right periatrial white matter consistent with small focus of chronic hemorrhage. No mass lesion, midline shift or mass effect. No hydrocephalus. No extra-axial fluid collection. Major dural sinuses grossly patent. Pituitary gland normal.   Midline structures intact and normal Vascular: Major intracranial vascular flow voids maintained.  Skull and upper cervical spine: Craniocervical junction normal. Upper cervical spine within normal limits. Bone marrow signal intensity diffusely heterogeneous. No acute scalp soft tissue abnormality. Sinuses/Orbits: Globes normal soft tissues within normal limits. Mild scattered mucosal thickening within the ethmoidal air cells. Paranasal sinuses are otherwise clear. No mastoid effusion. Inner ear structures normal. Other: None. IMPRESSION: 1. 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 2. Additional punctate ischemic infarct within the left splenium. 3. Generalized age-related cerebral atrophy with underlying moderate chronic microvascular ischemic disease. Electronically Signed   By: Jeannine Boga M.D.   On: 05/03/2017 23:36   Ct Cerebral Perfusion W Contrast  Result Date: 05/03/2017 CLINICAL DATA:  Left facial droop.  Right gaze. EXAM: CT ANGIOGRAPHY HEAD AND NECK CT PERFUSION BRAIN TECHNIQUE: Multidetector CT imaging of the head and neck was performed using the standard protocol during bolus administration of intravenous contrast. Multiplanar CT image reconstructions and MIPs were obtained to evaluate the vascular anatomy. Carotid stenosis measurements (when applicable) are obtained utilizing NASCET criteria, using the distal internal carotid diameter as the denominator. Multiphase CT imaging of the brain was performed following IV bolus contrast injection. Subsequent parametric perfusion maps were calculated using RAPID software. CONTRAST:  90 mL Isovue 370 COMPARISON:  Noncontrast head CT earlier today FINDINGS: CTA NECK FINDINGS Aortic arch: Standard 3 vessel aortic arch with mild atherosclerotic plaque. Widely patent brachiocephalic and subclavian arteries. Right carotid system: Patent without evidence of stenosis or dissection. Left carotid system: Patent without  evidence of stenosis or dissection. Vertebral arteries: Patent without evidence of stenosis or dissection. The right vertebral artery is slightly larger than the left. Skeleton: No fracture or suspicious osseous lesion. Other neck: Mild thyroid heterogeneity without sizable discrete nodule. Upper chest: Minimal atelectasis in the visualized lungs. Review of the MIP images confirms the above findings CTA HEAD FINDINGS Anterior circulation: The internal carotid arteries are patent from skullbase to carotid termini without stenosis. There is minimal right cavernous segment atherosclerosis. ACAs and MCAs are patent without evidence of proximal branch occlusion or significant proximal stenosis. The left A1 segment is moderately dominant. No aneurysm. Posterior circulation: The intracranial vertebral arteries are patent to the basilar with the right being mildly dominant. There is mild V4 segment atherosclerosis and irregularity without significant stenosis. Patent bilateral PICA, left AICA, and bilateral SCA origins are identified. The basilar artery is widely patent. Posterior communicating arteries are diminutive or absent. PCAs are patent with moderate right and mild left P3 and more distal branch vessel irregularity and narrowing but no significant proximal stenosis. No aneurysm. Venous sinuses: As permitted by contrast timing, patent. Anatomic variants: None. Delayed phase: Not performed. Review of the MIP images confirms the above findings CT Brain Perfusion Findings: CBF (<30%) Volume:  0 mL Perfusion (Tmax>6.0s) volume:  0 mL Mismatch Volume: n/a Infarction Location: n/a IMPRESSION: 1. Negative cerebral perfusion imaging. 2. No emergent large vessel occlusion. 3. Intracranial atherosclerosis with most notable involvement of right greater than left PCA branch vessels. No flow limiting proximal stenosis. 4. Widely patent cervical carotid and vertebral arteries. Preliminary findings of negative perfusion imaging and  no emergent large vessel occlusion were communicated in person to Dr. Leonel Ramsay on 05/03/2017 at 9:20 a.m. Electronically Signed   By: Logan Bores M.D.   On: 05/03/2017 09:41   Dg Hips Bilat With Pelvis 3-4 Views  Result Date: 05/03/2017 CLINICAL DATA:  Low back pain following a fall this morning. EXAM: DG HIP (WITH OR WITHOUT PELVIS)  3-4V BILAT COMPARISON:  None. FINDINGS: Excreted contrast in the urinary bladder. Normal appearing hips without fracture or dislocation. Lower lumbar spine degenerative changes. IMPRESSION: No hip fracture or dislocation. Electronically Signed   By: Claudie Revering M.D.   On: 05/03/2017 16:43   Ct Head Code Stroke Wo Contrast  Result Date: 05/03/2017 CLINICAL DATA:  Code stroke. Left-sided flaccid. Gaze to the left and facial droop. EXAM: CT HEAD WITHOUT CONTRAST TECHNIQUE: Contiguous axial images were obtained from the base of the skull through the vertex without intravenous contrast. COMPARISON:  None. FINDINGS: Brain: Age-indeterminate lenticulostriate pattern infarct in the right caudate head. No visible cortical infarct. No hemorrhage, hydrocephalus, or masslike finding. There is chronic small vessel ischemic type change in the deep cerebral white matter. Vascular: Atherosclerotic calcification.  No hyperdense vessel. Skull: No acute or aggressive finding. Sinuses/Orbits: No acute finding Other: Prelim text page sent 05/03/2017 at 8:42 am to Dr. Leonel Ramsay. ASPECTS [facility] Stroke Program Early CT Score) - Ganglionic level infarction (caudate, lentiform nuclei, internal capsule, insula, M1-M3 cortex): 6 - Supraganglionic infarction (M4-M6 cortex): 3 Total score (0-10 with 10 being normal): 9 IMPRESSION: 1. Age indeterminate right caudate head infarction with a lenticulostriate pattern. ASPECTS is 9. 2. Negative for acute hemorrhage. 3. Chronic small vessel ischemia. Electronically Signed   By: Monte Fantasia M.D.   On: 05/03/2017 08:43    Assessment/Plan: Diagnosis:  Right basal ganglia CVA Labs and images independently reviewed.  Records reviewed and summated above. Stroke: Continue secondary stroke prophylaxis and Risk Factor Modification listed below:   Antiplatelet therapy:   Blood Pressure Management:  Continue current medication with prn's with permisive HTN per primary team Statin Agent:   Tobacco abuse:  Counsel Left sided hemiparesis: fit for orthosis to prevent contractures (resting hand splint for day, wrist cock up splint at night, PRAFO, etc) Motor recovery: Fluoxetine  1. Does the need for close, 24 hr/day medical supervision in concert with the patient's rehab needs make it unreasonable for this patient to be served in a less intensive setting? Yes 2. Co-Morbidities requiring supervision/potential complications: HTN (monitor and provide prns in accordance with increased physical exertion and pain), right foot fractures 03/2017, pain (Biofeedback training with therapies to help reduce reliance on opiate and IV pain medications, particularly IV toradol, monitor pain control during therapies, and sedation at rest and titrate to maximum efficacy to ensure participation and gains in therapies), hypokalemia (continue to monitor and replete as necessary), ABLA (transfuse if necessary to ensure appropriate perfusion for increased activity tolerance) 3. Due to safety, disease management, pain management and patient education, does the patient require 24 hr/day rehab nursing? Yes 4. Does the patient require coordinated care of a physician, rehab nurse, PT (1-2 hrs/day, 5 days/week), OT (1-2 hrs/day, 5 days/week) and SLP (1-2 hrs/day, 5 days/week) to address physical and functional deficits in the context of the above medical diagnosis(es)? Yes Addressing deficits in the following areas: balance, endurance, locomotion, strength, transferring, bathing, dressing, grooming, toileting, cognition, speech, swallowing and psychosocial support 5. Can the patient  actively participate in an intensive therapy program of at least 3 hrs of therapy per day at least 5 days per week? Yes 6. The potential for patient to make measurable gains while on inpatient rehab is excellent 7. Anticipated functional outcomes upon discharge from inpatient rehab are min assist and mod assist  with PT, min assist with OT, modified independent with SLP. 8. Estimated rehab length of stay to reach the above functional goals is: 20-24  days. 9. Anticipated D/C setting: Home 10. Anticipated post D/C treatments: HH therapy and Home excercise program 11. Overall Rehab/Functional Prognosis: good  RECOMMENDATIONS: This patient's condition is appropriate for continued rehabilitative care in the following setting: CIR after completion of medical workup Patient has agreed to participate in recommended program. Yes Note that insurance prior authorization may be required for reimbursement for recommended care.  Comment: Rehab Admissions Coordinator to follow up.  Delice Lesch, MD, ABPMR Bary Leriche, [place] 05/05/2017

## 2017-05-05 NOTE — Progress Notes (Signed)
Physical Therapy Treatment Patient Details Name: Kelly Lowe MRN: [id number] DOB: November 03, 1950 Today's Date: 05/05/2017    History of Present Illness 66 y.o. female with a history of HTN and tobacco abuse who presents to the ED with L sided weakness that started this morning. 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    PT Comments    Pt progressing towards physical therapy goals. Was able to perform transfers and minimal ambulation with up to +2 max assist for balance support and safety. Pt required increased support during weight shift and assist to advance LLE during transfers to/from [facility]. Continue to feel that CIR is most appropriate d/c disposition. Will continue to follow.    Follow Up Recommendations  CIR     Equipment Recommendations  Other (comment) (to be determined at Rehab)    Recommendations for Other Services Rehab consult     Precautions / Restrictions Precautions Precautions: Fall Precaution Comments: R gaze preference Restrictions Weight Bearing Restrictions: No RLE Weight Bearing: Weight bearing as tolerated Other Position/Activity Restrictions: Must be in boot for any weight bearing    Mobility  Bed Mobility               General bed mobility comments: In recliner upon arrival  Transfers Overall transfer level: Needs assistance Equipment used: 2 person hand held assist Transfers: Sit to/from [facility] Sit to Stand: Mod assist;+2 physical assistance         General transfer comment: Pt required increased time and effort to power-up to full stand. L knee blocked initially but was able to maintain standing without knee block as well.   Ambulation/Gait Ambulation/Gait assistance: Max assist;+2 physical assistance Ambulation Distance (Feet): 5 Feet (x2) Assistive device: 2 person hand held assist Gait Pattern/deviations: Step-to  pattern;Decreased stride length;Trunk flexed Gait velocity: Decreased   General Gait Details: VC's for sequencing and general safety. Pt was able to initially perform pre-gait activity while standing at the chair. Max assist provided for weight shift to each side. Pt with grossly flexed trunk but able to make corrective changes with cues for upright posture. Pt took some pivotal steps around from chair to [facility], and then again back to chair at end of session.    Stairs            Wheelchair Mobility    Modified Rankin (Stroke Patients Only) Modified Rankin (Stroke Patients Only) Pre-Morbid Rankin Score: No significant disability Modified Rankin: Severe disability     Balance Overall balance assessment: Needs assistance Sitting-balance support: Feet supported;Single extremity supported Sitting balance-Leahy Scale: Poor     Standing balance support: During functional activity Standing balance-Leahy Scale: Poor                              Cognition Arousal/Alertness: Awake/alert Behavior During Therapy: WFL for tasks assessed/performed Overall Cognitive Status: Impaired/Different from baseline Area of Impairment: Attention;Problem solving;Following commands                   Current Attention Level: Selective (L inattention)   Following Commands: Follows one step commands with increased time;Follows multi-step commands with increased time;Follows multi-step commands inconsistently     Problem Solving: Slow processing;Difficulty sequencing;Requires verbal cues;Requires tactile cues        Exercises      General Comments        Pertinent Vitals/Pain Pain Assessment: Faces  Faces Pain Scale: Hurts little more Pain Location: back Pain Descriptors / Indicators: Aching Pain Intervention(s): Monitored during session    Home Living                      Prior Function            PT Goals (current goals can now be found in the care  plan section) Acute Rehab PT Goals Patient Stated Goal: Get better and go home PT Goal Formulation: With patient Time For Goal Achievement: 05/18/17 Potential to Achieve Goals: Good Progress towards PT goals: Progressing toward goals    Frequency    Min 4X/week      PT Plan Current plan remains appropriate    Co-evaluation              AM-PAC PT "6 Clicks" Daily Activity  Outcome Measure  Difficulty turning over in bed (including adjusting bedclothes, sheets and blankets)?: Unable Difficulty moving from lying on back to sitting on the side of the bed? : Unable Difficulty sitting down on and standing up from a chair with arms (e.g., wheelchair, bedside commode, etc,.)?: Unable Help needed moving to and from a bed to chair (including a wheelchair)?: A Lot Help needed walking in hospital room?: A Lot Help needed climbing 3-5 steps with a railing? : Total 6 Click Score: 8    End of Session Equipment Utilized During Treatment: Gait belt Activity Tolerance: Patient tolerated treatment well Patient left: in chair;with call bell/phone within reach Nurse Communication: Mobility status PT Visit Diagnosis: Hemiplegia and hemiparesis Hemiplegia - Right/Left: Left Hemiplegia - dominant/non-dominant: Non-dominant Hemiplegia - caused by: Cerebral infarction     Time: 0826-0853 PT Time Calculation (min) (ACUTE ONLY): 27 min  Charges:  $Therapeutic Activity: 23-37 mins                    G Codes:       Rolinda Roan, PT, DPT Acute Rehabilitation Services Pager: [phone]    Thelma Comp 05/05/2017, 10:37 AM

## 2017-05-05 NOTE — Evaluation (Signed)
Speech Language Pathology Evaluation Patient Details Name: EDONA SCHREFFLER MRN: [id number] DOB: 1950-12-03 Today's Date: 05/05/2017 Time: 4034-7425 SLP Time Calculation (min) (ACUTE ONLY): 21 min  Problem List:  Patient Active Problem List   Diagnosis Date Noted  . Essential hypertension 05/05/2017  . Tobacco use 05/05/2017  . Cerebral infarction ([place])   . Pain   . Hypokalemia   . Acute blood loss anemia   . CVA (cerebral vascular accident) ([place]) 05/03/2017   Past Medical History:  Past Medical History:  Diagnosis Date  . Hypertension    Past Surgical History:  Past Surgical History:  Procedure Laterality Date  . FOOT SURGERY     HPI:  Bliss Behnke. Hagg is a 66 y.o. female with a history of HTN and tobacco abuse who presented to the ED with L sided weakness. 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 Per RN notes, pt failed swallow screen in ED.   Assessment / Plan / Recommendation Clinical Impression  Pt has c/o mild dysarthria although with intelligible speech at the conversational level. Her phonation is monotonous and her affect is flat. She has reduced attention to her left environment and needs Min cues for sustained attention to tasks. Her responses to questions, although accurate, are often delayed, which I suspect may be related to higher level cognitive deficits and/or decreased thought organization. Recommend additional SLP f/u acutely and at CIR upon d/c.    SLP Assessment  SLP Recommendation/Assessment: Patient needs continued Speech Lanaguage Pathology Services SLP Visit Diagnosis: Cognitive communication deficit (R41.841)    Follow Up Recommendations  Inpatient Rehab    Frequency and Duration min 2x/week  2 weeks      SLP Evaluation Cognition  Overall Cognitive Status: Impaired/Different from baseline Arousal/Alertness:  Awake/alert Orientation Level: Oriented X4 Attention: Sustained Sustained Attention: Impaired Sustained Attention Impairment: Functional basic Memory: Appears intact (simple tasks, recall of 4 words) Awareness: Impaired Awareness Impairment: Emergent impairment;Anticipatory impairment Problem Solving: Impaired Problem Solving Impairment: Functional complex       Comprehension  Auditory Comprehension Overall Auditory Comprehension: Appears within functional limits for tasks assessed    Expression Expression Primary Mode of Expression: Verbal Verbal Expression Overall Verbal Expression: Appears within functional limits for tasks assessed   Oral / Motor  Oral Motor/Sensory Function Overall Oral Motor/Sensory Function: Moderate impairment Facial ROM: Reduced left;Suspected CN VII (facial) dysfunction Facial Symmetry: Abnormal symmetry left;Suspected CN VII (facial) dysfunction Facial Strength: Reduced left;Suspected CN VII (facial) dysfunction Lingual ROM: Reduced right;Suspected CN XII (hypoglossal) dysfunction Lingual Symmetry: Within Functional Limits Lingual Strength: Reduced Motor Speech Overall Motor Speech: Impaired Respiration: Within functional limits Phonation: Other (comment) (monotonous) Resonance: Within functional limits Articulation: Impaired Level of Impairment: Conversation Intelligibility: Intelligible   GO                    Kelly Lowe 05/05/2017, 4:27 PM  Kelly Lowe, M.A. CCC-SLP [phone]

## 2017-05-05 NOTE — Progress Notes (Signed)
  Speech Language Pathology Treatment: Dysphagia  Patient Details Name: Kelly Lowe MRN: [id number] DOB: 30-Apr-1951 Today's Date: 05/05/2017 Time: 6333-5456 SLP Time Calculation (min) (ACUTE ONLY): 10 min  Assessment / Plan / Recommendation Clinical Impression  Pt had minimal intake on her pureed diet, taking in only applesauce and pudding per her daughter. Her mastication with soft solids is mildly prolonged and she exhibits L-sided buccal pocketing. She verbalizes her need to use a lingual sweep with Mod I, but needs Min-Mod cues to utilize it. No overt s/s of aspiration were observed. Recommend advancement to Dys 2 diet and thin liquids to hopefully increase intake. She is a great candidate for CIR.   HPI HPI: Kelly Lowe is a 66 y.o. female with a history of HTN and tobacco abuse who presented to the ED with L sided weakness. 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 Per RN notes, pt failed swallow screen in ED.      SLP Plan  Continue with current plan of care       Recommendations  Diet recommendations: Dysphagia 2 (fine chop);Thin liquid Liquids provided via: Cup;No straw Medication Administration: Whole meds with puree Supervision: Patient able to self feed;Full supervision/cueing for compensatory strategies Compensations: Slow rate;Small sips/bites;Lingual sweep for clearance of pocketing Postural Changes and/or Swallow Maneuvers: Seated upright 90 degrees;Upright 30-60 min after meal                Oral Care Recommendations: Oral care BID Follow up Recommendations: Inpatient Rehab SLP Visit Diagnosis: Dysphagia, oral phase (R13.11) Plan: Continue with current plan of care       GO                Germain Osgood 05/05/2017, 3:16 PM  Germain Osgood, M.A. CCC-SLP [phone]

## 2017-05-06 ENCOUNTER — Inpatient Hospital Stay (HOSPITAL_COMMUNITY): Payer: 59

## 2017-05-06 ENCOUNTER — Encounter (HOSPITAL_COMMUNITY): Payer: Self-pay | Admitting: *Deleted

## 2017-05-06 ENCOUNTER — Inpatient Hospital Stay (HOSPITAL_COMMUNITY)
Admission: RE | Admit: 2017-05-06 | Discharge: 2017-05-28 | DRG: 057 | Disposition: A | Discharge status: Home Health | Payer: 59 | Source: Intra-hospital | Attending: Physical Medicine & Rehabilitation | Admitting: Physical Medicine & Rehabilitation

## 2017-05-06 DIAGNOSIS — I6389 Other cerebral infarction: Secondary | ICD-10-CM | POA: Diagnosis not present

## 2017-05-06 DIAGNOSIS — M545 Low back pain: Secondary | ICD-10-CM

## 2017-05-06 DIAGNOSIS — F419 Anxiety disorder, unspecified: Secondary | ICD-10-CM | POA: Diagnosis present

## 2017-05-06 DIAGNOSIS — Z79899 Other long term (current) drug therapy: Secondary | ICD-10-CM | POA: Diagnosis not present

## 2017-05-06 DIAGNOSIS — G479 Sleep disorder, unspecified: Secondary | ICD-10-CM | POA: Diagnosis present

## 2017-05-06 DIAGNOSIS — S92591D Other fracture of right lesser toe(s), subsequent encounter for fracture with routine healing: Secondary | ICD-10-CM | POA: Diagnosis not present

## 2017-05-06 DIAGNOSIS — R000 Tachycardia, unspecified: Secondary | ICD-10-CM | POA: Diagnosis not present

## 2017-05-06 DIAGNOSIS — I69312 Visuospatial deficit and spatial neglect following cerebral infarction: Secondary | ICD-10-CM | POA: Diagnosis not present

## 2017-05-06 DIAGNOSIS — I6300 Cerebral infarction due to thrombosis of unspecified precerebral artery: Secondary | ICD-10-CM

## 2017-05-06 DIAGNOSIS — I69392 Facial weakness following cerebral infarction: Secondary | ICD-10-CM | POA: Diagnosis not present

## 2017-05-06 DIAGNOSIS — G8929 Other chronic pain: Secondary | ICD-10-CM | POA: Diagnosis present

## 2017-05-06 DIAGNOSIS — R0682 Tachypnea, not elsewhere classified: Secondary | ICD-10-CM | POA: Diagnosis present

## 2017-05-06 DIAGNOSIS — G8194 Hemiplegia, unspecified affecting left nondominant side: Secondary | ICD-10-CM | POA: Diagnosis not present

## 2017-05-06 DIAGNOSIS — I69319 Unspecified symptoms and signs involving cognitive functions following cerebral infarction: Secondary | ICD-10-CM | POA: Diagnosis not present

## 2017-05-06 DIAGNOSIS — I10 Essential (primary) hypertension: Secondary | ICD-10-CM | POA: Diagnosis not present

## 2017-05-06 DIAGNOSIS — K5901 Slow transit constipation: Secondary | ICD-10-CM | POA: Diagnosis not present

## 2017-05-06 DIAGNOSIS — X58XXXD Exposure to other specified factors, subsequent encounter: Secondary | ICD-10-CM | POA: Diagnosis present

## 2017-05-06 DIAGNOSIS — N319 Neuromuscular dysfunction of bladder, unspecified: Secondary | ICD-10-CM | POA: Diagnosis present

## 2017-05-06 DIAGNOSIS — S92352D Displaced fracture of fifth metatarsal bone, left foot, subsequent encounter for fracture with routine healing: Secondary | ICD-10-CM | POA: Diagnosis not present

## 2017-05-06 DIAGNOSIS — I69322 Dysarthria following cerebral infarction: Secondary | ICD-10-CM

## 2017-05-06 DIAGNOSIS — D62 Acute posthemorrhagic anemia: Secondary | ICD-10-CM | POA: Diagnosis not present

## 2017-05-06 DIAGNOSIS — F4321 Adjustment disorder with depressed mood: Secondary | ICD-10-CM

## 2017-05-06 DIAGNOSIS — F17210 Nicotine dependence, cigarettes, uncomplicated: Secondary | ICD-10-CM | POA: Diagnosis present

## 2017-05-06 DIAGNOSIS — R197 Diarrhea, unspecified: Secondary | ICD-10-CM | POA: Diagnosis not present

## 2017-05-06 DIAGNOSIS — I672 Cerebral atherosclerosis: Secondary | ICD-10-CM | POA: Diagnosis present

## 2017-05-06 DIAGNOSIS — E785 Hyperlipidemia, unspecified: Secondary | ICD-10-CM | POA: Diagnosis present

## 2017-05-06 DIAGNOSIS — S92901D Unspecified fracture of right foot, subsequent encounter for fracture with routine healing: Secondary | ICD-10-CM | POA: Diagnosis not present

## 2017-05-06 DIAGNOSIS — I639 Cerebral infarction, unspecified: Secondary | ICD-10-CM | POA: Diagnosis not present

## 2017-05-06 DIAGNOSIS — K5900 Constipation, unspecified: Secondary | ICD-10-CM

## 2017-05-06 DIAGNOSIS — S92302A Fracture of unspecified metatarsal bone(s), left foot, initial encounter for closed fracture: Secondary | ICD-10-CM

## 2017-05-06 DIAGNOSIS — I69354 Hemiplegia and hemiparesis following cerebral infarction affecting left non-dominant side: Principal | ICD-10-CM

## 2017-05-06 DIAGNOSIS — R4189 Other symptoms and signs involving cognitive functions and awareness: Secondary | ICD-10-CM | POA: Diagnosis present

## 2017-05-06 DIAGNOSIS — Z09 Encounter for follow-up examination after completed treatment for conditions other than malignant neoplasm: Secondary | ICD-10-CM

## 2017-05-06 DIAGNOSIS — S92901S Unspecified fracture of right foot, sequela: Secondary | ICD-10-CM | POA: Diagnosis not present

## 2017-05-06 LAB — GLUCOSE, CAPILLARY: Glucose-Capillary: 86 mg/dL (ref 65–99)

## 2017-05-06 LAB — BASIC METABOLIC PANEL
Anion gap: 8 (ref 5–15)
BUN: 13 mg/dL (ref 6–20)
CHLORIDE: 107 mmol/L (ref 101–111)
CO2: 21 mmol/L — AB (ref 22–32)
CREATININE: 0.79 mg/dL (ref 0.44–1.00)
Calcium: 9.2 mg/dL (ref 8.9–10.3)
GFR calc non Af Amer: >60 mL/min (ref >60)
GLUCOSE: 96 mg/dL (ref 65–99)
Potassium: 3.5 mmol/L (ref 3.5–5.1)
Sodium: 136 mmol/L (ref 135–145)

## 2017-05-06 MED ORDER — TRAZODONE HCL 50 MG PO TABS
25.0000 mg | ORAL_TABLET | Freq: Every evening | ORAL | Status: DC | PRN
  Administered 2017-05-21: 50 mg via ORAL
  Administered 2017-05-27: 25 mg via ORAL
  Filled 2017-05-06 (×2): qty 1

## 2017-05-06 MED ORDER — ASPIRIN 325 MG PO TABS: 325.0000 mg | ORAL_TABLET | Freq: Every day | ORAL | 0 refills | Status: AC

## 2017-05-06 MED ORDER — DIPHENHYDRAMINE HCL 12.5 MG/5ML PO ELIX
12.5000 mg | ORAL_SOLUTION | Freq: Four times a day (QID) | ORAL | Status: DC | PRN
  Administered 2017-05-10 – 2017-05-18 (×3): 25 mg via ORAL
  Filled 2017-05-06 (×3): qty 10

## 2017-05-06 MED ORDER — NICOTINE 14 MG/24HR TD PT24: 14.0000 mg | MEDICATED_PATCH | Freq: Every day | TRANSDERMAL | 0 refills | Status: DC

## 2017-05-06 MED ORDER — ENOXAPARIN SODIUM 40 MG/0.4ML ~~LOC~~ SOLN
40.0000 mg | Freq: Every day | SUBCUTANEOUS | Status: DC
  Administered 2017-05-07 – 2017-05-28 (×22): 40 mg via SUBCUTANEOUS
  Filled 2017-05-06 (×22): qty 0.4

## 2017-05-06 MED ORDER — PROCHLORPERAZINE EDISYLATE 5 MG/ML IJ SOLN: 5.0000 mg | Freq: Four times a day (QID) | INTRAMUSCULAR | Status: DC | PRN

## 2017-05-06 MED ORDER — BISACODYL 10 MG RE SUPP
10.0000 mg | Freq: Every day | RECTAL | Status: DC | PRN
  Administered 2017-05-08: 10 mg via RECTAL
  Filled 2017-05-06 (×3): qty 1

## 2017-05-06 MED ORDER — ACETAMINOPHEN 325 MG PO TABS: 650.0000 mg | ORAL_TABLET | Freq: Four times a day (QID) | ORAL | Status: DC | PRN

## 2017-05-06 MED ORDER — DICLOFENAC SODIUM 1 % TD GEL
2.0000 g | Freq: Four times a day (QID) | TRANSDERMAL | Status: DC
  Administered 2017-05-06 – 2017-05-27 (×20): 2 g via TOPICAL
  Filled 2017-05-06 (×3): qty 100

## 2017-05-06 MED ORDER — ACETAMINOPHEN 325 MG PO TABS
325.0000 mg | ORAL_TABLET | ORAL | Status: DC | PRN
  Administered 2017-05-10 – 2017-05-13 (×5): 650 mg via ORAL
  Administered 2017-05-14: 325 mg via ORAL
  Administered 2017-05-15 – 2017-05-26 (×8): 650 mg via ORAL
  Filled 2017-05-06 (×15): qty 2

## 2017-05-06 MED ORDER — ATORVASTATIN CALCIUM 40 MG PO TABS: 40.0000 mg | ORAL_TABLET | Freq: Every day | ORAL | 0 refills | Status: DC

## 2017-05-06 MED ORDER — ATORVASTATIN CALCIUM 40 MG PO TABS
40.0000 mg | ORAL_TABLET | Freq: Every day | ORAL | Status: DC
  Administered 2017-05-07 – 2017-05-27 (×21): 40 mg via ORAL
  Filled 2017-05-06 (×21): qty 1

## 2017-05-06 MED ORDER — ALUM & MAG HYDROXIDE-SIMETH 200-200-20 MG/5ML PO SUSP: 30.0000 mL | ORAL | Status: DC | PRN

## 2017-05-06 MED ORDER — PREMIER PROTEIN SHAKE: 2.0000 [oz_av] | Freq: Four times a day (QID) | ORAL | 0 refills | Status: DC

## 2017-05-06 MED ORDER — PROCHLORPERAZINE MALEATE 5 MG PO TABS: 5.0000 mg | ORAL_TABLET | Freq: Four times a day (QID) | ORAL | Status: DC | PRN

## 2017-05-06 MED ORDER — PREMIER PROTEIN SHAKE
2.0000 [oz_av] | Freq: Four times a day (QID) | ORAL | Status: DC
  Administered 2017-05-06 – 2017-05-26 (×13): 2 [oz_av] via ORAL
  Filled 2017-05-06 (×101): qty 325.31

## 2017-05-06 MED ORDER — GUAIFENESIN-DM 100-10 MG/5ML PO SYRP: 5.0000 mL | ORAL_SOLUTION | Freq: Four times a day (QID) | ORAL | Status: DC | PRN

## 2017-05-06 MED ORDER — NICOTINE 14 MG/24HR TD PT24
14.0000 mg | MEDICATED_PATCH | Freq: Every day | TRANSDERMAL | Status: DC
  Administered 2017-05-07 – 2017-05-28 (×22): 14 mg via TRANSDERMAL
  Filled 2017-05-06 (×22): qty 1

## 2017-05-06 MED ORDER — FLEET ENEMA 7-19 GM/118ML RE ENEM: 1.0000 | ENEMA | Freq: Once | RECTAL | Status: DC | PRN

## 2017-05-06 MED ORDER — ADULT MULTIVITAMIN W/MINERALS CH
1.0000 | ORAL_TABLET | Freq: Every day | ORAL | Status: DC
  Administered 2017-05-07 – 2017-05-28 (×22): 1 via ORAL
  Filled 2017-05-06 (×22): qty 1

## 2017-05-06 MED ORDER — POLYETHYLENE GLYCOL 3350 17 G PO PACK
17.0000 g | PACK | Freq: Every day | ORAL | Status: DC | PRN
  Administered 2017-05-06 – 2017-05-07 (×2): 17 g via ORAL
  Filled 2017-05-06 (×2): qty 1

## 2017-05-06 MED ORDER — ASPIRIN 325 MG PO TABS
325.0000 mg | ORAL_TABLET | Freq: Every day | ORAL | Status: DC
  Administered 2017-05-07 – 2017-05-28 (×22): 325 mg via ORAL
  Filled 2017-05-06 (×22): qty 1

## 2017-05-06 MED ORDER — ACETAMINOPHEN 650 MG RE SUPP: 650.0000 mg | Freq: Four times a day (QID) | RECTAL | 0 refills | Status: DC | PRN

## 2017-05-06 MED ORDER — PROCHLORPERAZINE 25 MG RE SUPP: 12.5000 mg | Freq: Four times a day (QID) | RECTAL | Status: DC | PRN

## 2017-05-06 MED ORDER — TRAMADOL HCL 50 MG PO TABS
50.0000 mg | ORAL_TABLET | Freq: Four times a day (QID) | ORAL | Status: DC | PRN
  Administered 2017-05-06 – 2017-05-17 (×10): 50 mg via ORAL
  Filled 2017-05-06 (×12): qty 1

## 2017-05-06 NOTE — Progress Notes (Signed)
Occupational Therapy Treatment Patient Details Name: Kelly Lowe MRN: [id number] DOB: [birth year] Today's Date: 05/06/2017    History of present illness 66 y.o. female with a history of HTN and tobacco abuse who presents to the ED with L sided weakness that started this morning. 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   OT comments  Pt progressing towards OT goals. Pt donned boot on R foot with Mod A and hand-over-hand to incorporate LUE into task. Pt performing lateral leans and righting reactions to increase trunk control and sitting balance; pt requiring Min A to correct L lateral lean. Continue to recommend dc to CIR to optimize independence with ADLs and functional mobility. Will continue to follow acutely to facilitate safe dc.    Follow Up Recommendations  CIR;Supervision/Assistance - 24 hour    Equipment Recommendations  Other (comment) (Defer to next venue)    Recommendations for Other Services Rehab consult;PT consult;Speech consult    Precautions / Restrictions Precautions Precautions: Fall Precaution Comments: R gaze preference Restrictions Weight Bearing Restrictions: No RLE Weight Bearing: Weight bearing as tolerated Other Position/Activity Restrictions: Must be in boot for any weight bearing       Mobility Bed Mobility Overal bed mobility: Needs Assistance Bed Mobility: Sidelying to Sit;Rolling;Sit to Supine Rolling: Min guard;+2 for safety/equipment Sidelying to sit: Mod assist;+2 for safety/equipment   Sit to supine: Max assist;+2 for physical assistance   General bed mobility comments: pt able to roll to L with min-guard A and use of rail. Max A to manage BLE with sup <> sit and +2 for managament of trunk  Transfers Overall transfer level: Needs assistance Equipment used: 2 person hand held assist Transfers: Sit to/from [facility] Sit to  Stand: Mod assist;+2 physical assistance Stand pivot transfers: Max assist;+2 physical assistance       General transfer comment: Pt required increased time and effort to power-up to full stand. L knee blocked initially but was able to maintain standing without knee block as well. Needs facilitation of LLE movement during transfer. Transferred bot bed to chair and chair back to bed to go down for a test.     Balance Overall balance assessment: Needs assistance Sitting-balance support: Feet supported;Single extremity supported Sitting balance-Leahy Scale: Poor Sitting balance - Comments: worked on leaning onto R elbow and then L and righting self from each side. Also worked on attending to L side in sitting Postural control: Right lateral lean Standing balance support: During functional activity Standing balance-Leahy Scale: Poor                             ADL either performed or assessed with clinical judgement   ADL Overall ADL's : Needs assistance/impaired                     Lower Body Dressing: +2 for physical assistance;Sit to/from stand;Moderate assistance Lower Body Dressing Details (indicate cue type and reason): Pt donned boot on R foot with Mod A. Pt requiring hand over hand A to incorporate LUE into bilateral UE task such as applying straps across boot             Functional mobility during ADLs: Maximal assistance;+2 for physical assistance;Cueing for sequencing (Stand pivot only) General ADL Comments: Focused session on sitting balance and right reactions. Pt performing R and L lateral leans and required Min A  for correcting leans to L. Pt requiring Max cues to attend to L hand during task and make sure it was not hanging off EOB     Vision   Vision Assessment?: Vision impaired- to be further tested in functional context   Perception     Praxis      Cognition Arousal/Alertness: Awake/alert Behavior During Therapy: WFL for tasks  assessed/performed Overall Cognitive Status: Impaired/Different from baseline Area of Impairment: Attention;Problem solving;Following commands                   Current Attention Level: Selective   Following Commands: Follows one step commands with increased time;Follows multi-step commands with increased time;Follows multi-step commands inconsistently     Problem Solving: Slow processing;Difficulty sequencing;Requires verbal cues;Requires tactile cues General Comments: L inattention but able to cross midline with gaze and manage LUE with R hand when cued        Exercises     Shoulder Instructions       General Comments pt's sister present for session, education for stimulating left side    Pertinent Vitals/ Pain       Pain Assessment: Faces Faces Pain Scale: Hurts even more Pain Location: L foot with touch or WB'ing Pain Descriptors / Indicators: Aching;Burning Pain Intervention(s): Monitored during session;Limited activity within patient's tolerance;Repositioned  Home Living                                          Prior Functioning/Environment Level of Independence:  (due to recent broken foot; Independent prior to that)            Frequency  Min 3X/week        Progress Toward Goals  OT Goals(current goals can now be found in the care plan section)  Progress towards OT goals: Progressing toward goals  Acute Rehab OT Goals Patient Stated Goal: Get better and go home OT Goal Formulation: With patient Time For Goal Achievement: 05/18/17 Potential to Achieve Goals: Good ADL Goals Pt Will Perform Eating: with supervision;with set-up;sitting (using LUE as independent stablizer) Pt Will Perform Grooming: with set-up;with supervision;sitting (using LUE as independent stablizer) Pt Will Perform Upper Body Dressing: with set-up;with supervision;sitting Pt Will Transfer to Toilet: with min assist;ambulating;bedside commode Pt Will Perform  Toileting - Clothing Manipulation and hygiene: with min assist;sit to/from stand  Plan Discharge plan remains appropriate    Co-evaluation    PT/OT/SLP Co-Evaluation/Treatment: Yes Reason for Co-Treatment: Complexity of the patient's impairments (multi-system involvement) PT goals addressed during session: Mobility/safety with mobility OT goals addressed during session: ADL's and self-care      AM-PAC PT "6 Clicks" Daily Activity     Outcome Measure   Help from another person eating meals?: A Lot Help from another person taking care of personal grooming?: A Lot Help from another person toileting, which includes using toliet, bedpan, or urinal?: A Lot Help from another person bathing (including washing, rinsing, drying)?: A Lot Help from another person to put on and taking off regular upper body clothing?: A Lot Help from another person to put on and taking off regular lower body clothing?: A Lot 6 Click Score: 12    End of Session Equipment Utilized During Treatment: Gait belt  OT Visit Diagnosis: Unsteadiness on feet (R26.81);Other abnormalities of gait and mobility (R26.89);Muscle weakness (generalized) (M62.81);Other symptoms and signs involving cognitive function;Hemiplegia and hemiparesis Hemiplegia - Right/Left:  Left Hemiplegia - dominant/non-dominant: Non-Dominant Hemiplegia - caused by: Cerebral infarction   Activity Tolerance Patient tolerated treatment well   Patient Left with call bell/phone within reach;with family/visitor present;in bed (with transport team)   Nurse Communication Mobility status (Needs external cath)    Functional Assessment Tool Used: Clinical judgement Functional Limitation: Self care Self Care Current Status (N3567): At least 60 percent but less than 80 percent impaired, limited or restricted Self Care Goal Status (O1410): At least 20 percent but less than 40 percent impaired, limited or restricted   Time: 1328-1403 OT Time Calculation  (min): 35 min  Charges: OT G-codes **NOT FOR INPATIENT CLASS** Functional Assessment Tool Used: Clinical judgement Functional Limitation: Self care Self Care Current Status (V0131): At least 60 percent but less than 80 percent impaired, limited or restricted Self Care Goal Status (Y3888): At least 20 percent but less than 40 percent impaired, limited or restricted OT General Charges $OT Visit: 1 Visit OT Treatments $Therapeutic Activity: 8-22 mins  [place], OTR/L Acute Rehab Pager: [phone] Office: [place] 05/06/2017, 5:18 PM

## 2017-05-06 NOTE — Progress Notes (Signed)
Pt admitted to unit at 1810 with daughter at bedside. RN educated and reviewed rehab process, saftey plan and booklet with pt and family. Pt and family request air mattress overlay for comfort only. Skin scattered bruising noted with no further skin issues at this time. Algis Liming, PA aware of bed request and order placed. RN educated pt and family on timed toileting, bowel and bladder management with no use of peri wick per unit policy.  Daughter at bedside with call bell in reach.

## 2017-05-06 NOTE — H&P (Signed)
Physical Medicine and Rehabilitation Admission H&P       Chief Complaint  Patient presents with  . Functional deficits due to stroke with left hemiparesis, right gaze preference and higher level cognitive deficits.      HPI:  Kelly Lowe a 66 y.o.femalewith history of HTN, right foot fractures 03/2017; who was admitted on 05/03/17 with left sided weakness, right gaze preference and left facial droop. History taken from chart review, patient, and sister. CT head reviewed, showing right basal gangial infarct. Per report, age indeterminate right caudate infarct and CTA head/neck revealed intracranial atherosclerosis involving R>L PCA. MRI brain done revealing Non-hemorrhagic infarct in right basal ganglia, punctate infarct in left splenium and moderate underlying small vessel disease. 2 D echo done revealing EF 65-70% with no wall abnormality, mild MVR, TVR and PVR. Hip films done due to reports of fall and back pain and showed DDD and no hip fracture or dislocation.   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 Dr. Leonie Man recommends ASA for embolic stroke due to unknown source and will  need TEE/loop. TCD without hits or evidence of PFO. Patient with resultant deficits in mobility and self care tasks due to dense left hemiparesis with sensory deficits, right gaze preference, decrease awareness of deficits as well as delayed processing due to higher level deficits. CIR recommended for follow up therapy.    Review of Systems  Constitutional: Negative for fever.  HENT: Negative for hearing loss.   Eyes: Negative for double vision.  Respiratory: Negative for cough.   Cardiovascular: Negative for palpitations.  Gastrointestinal: Negative for nausea.  Genitourinary: Negative for urgency.  Musculoskeletal: Negative for myalgias.  Skin: Negative for rash.  Neurological:  Positive for sensory change, speech change and focal weakness.  Psychiatric/Behavioral: The patient has insomnia.           Past Medical History:  Diagnosis Date  . Hypertension          Past Surgical History:  Procedure Laterality Date  . FOOT SURGERY      History reviewed. No pertinent family history.    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 She reports that she has been smoking Cigarettes-- 1/2 PPD. She has a 12.50 pack-year smoking history. She has never used smokeless tobacco. She reports that she drinks about a glass of wine on special occasions/holidays. She reports that she does not use drugs.    Allergies: No Known Allergies          Medications Prior to Admission  Medication Sig Dispense Refill  . amLODipine (NORVASC) 5 MG tablet Take 5 mg by mouth daily.  1  . BIOTIN PO Take 1 tablet by mouth daily.    . Cyanocobalamin (VITAMIN B 12 PO) Take 1 tablet by mouth daily.    . hydrochlorothiazide (HYDRODIURIL) 12.5 MG tablet Take 12.5 mg by mouth every morning.  1  . ibuprofen (ADVIL,MOTRIN) 200 MG tablet Take 200 mg by mouth every 6 (six) hours as needed for mild pain.    . Multiple Vitamin (MULTIVITAMIN WITH MINERALS) TABS tablet Take 1 tablet by mouth daily.      Drug Regimen Review  Drug regimen was reviewed and remains appropriate with no significant issues identified  Home: Home Living Family/patient expects to be discharged to:: Inpatient rehab Living Arrangements: Alone Available Help at Discharge: Family, Available 24 hours/day Additional  Comments: Daughters in room plan to arrange for pt to stay with one of them   Functional History: Prior Function Level of Independence: Independent with assistive device(s) Comments: ADLs and IADLs. Broke R foot a few weeks ago; must be in boot to stand/walk  Functional Status:  Mobility: Bed Mobility Overal bed mobility: Needs Assistance Bed Mobility:  Supine to Sit Supine to sit: Mod assist General bed mobility comments: In recliner upon arrival Transfers Overall transfer level: Needs assistance Equipment used: 2 person hand held assist Transfers: Sit to/from Stand, Stand Pivot Transfers Sit to Stand: Mod assist, +2 physical assistance Stand pivot transfers: Max assist General transfer comment: Pt required increased time and effort to power-up to full stand. L knee blocked initially but was able to maintain standing without knee block as well.  Ambulation/Gait Ambulation/Gait assistance: Max assist, +2 physical assistance Ambulation Distance (Feet): 5 Feet (x2) Assistive device: 2 person hand held assist Gait Pattern/deviations: Step-to pattern, Decreased stride length, Trunk flexed General Gait Details: VC's for sequencing and general safety. Pt was able to initially perform pre-gait activity while standing at the chair. Max assist provided for weight shift to each side. Pt with grossly flexed trunk but able to make corrective changes with cues for upright posture. Pt took some pivotal steps around from chair to [facility], and then again back to chair at end of session.  Gait velocity: Decreased  ADL: ADL Overall ADL's : Needs assistance/impaired Eating/Feeding: Moderate assistance, Sitting Eating/Feeding Details (indicate cue type and reason): Pt fixed her coffee while sitting in reclienr with support. Pt requiring Max hand over hand assist with use L hand for opening surgar packets and performing bialteral coordination tasks.  Grooming: Moderate assistance, Sitting Grooming Details (indicate cue type and reason): supported sitting Upper Body Bathing: Moderate assistance, Sitting Lower Body Bathing: Maximal assistance, +2 for physical assistance, Sit to/from stand Upper Body Dressing : Moderate assistance, Sitting Lower Body Dressing: Maximal assistance, +2 for physical assistance, Sit to/from stand Functional mobility during ADLs:  Maximal assistance (sit<>stand only) General ADL Comments: Pt demosntrating decreased functional performance. Pt requiring Max hand over hand to use LUE during ADLs. Pt requiring Max A for sit<>stand. Educated family and pt on sensory deficits and to be cautious with hot/cold; pt wanting to drink her coffee.   Cognition: Cognition Overall Cognitive Status: Impaired/Different from baseline Arousal/Alertness: Awake/alert Orientation Level: Oriented X4 Attention: Sustained Sustained Attention: Impaired Sustained Attention Impairment: Functional basic Memory: Appears intact (simple tasks, recall of 4 words) Awareness: Impaired Awareness Impairment: Emergent impairment, Anticipatory impairment Problem Solving: Impaired Problem Solving Impairment: Functional complex Cognition Arousal/Alertness: Awake/alert Behavior During Therapy: WFL for tasks assessed/performed Overall Cognitive Status: Impaired/Different from baseline Area of Impairment: Attention, Problem solving, Following commands Current Attention Level: Selective (L inattention) Following Commands: Follows one step commands with increased time, Follows multi-step commands with increased time, Follows multi-step commands inconsistently Problem Solving: Slow processing, Difficulty sequencing, Requires verbal cues, Requires tactile cues General Comments: Pt able to recall 3/3 words for ST memory testing. Pt abel to name five animals with minimal increased time. Pt requiring Max cues to perform simple math problem and demosntrating difficulty with multi-step tasks   Blood pressure 116/88, pulse (!) 58, temperature 98.6 F (37 C), temperature source Oral, resp. rate 17, height 5' 7"  (1.702 m), weight 87.8 kg (193 lb 8 oz), SpO2 100 %. Physical Exam  Constitutional: She appears well-developed and well-nourished.  Appears fatigued  HENT:  Head: Normocephalic and atraumatic.  Mouth/Throat: No oropharyngeal exudate.  Eyes: Pupils are  equal, round, and reactive to light.  Neck: No tracheal deviation present. No thyromegaly present.  Cardiovascular: Normal rate and regular rhythm.  Exam reveals no gallop and no friction rub.   No murmur heard. Respiratory: No respiratory distress. She has no wheezes. She has no rales. She exhibits no tenderness.  GI: She exhibits no distension. There is no tenderness. There is no rebound.  Musculoskeletal: She exhibits no edema or deformity.  Neurological:  Pt drowsy, right gaze preference. Does initiate with left side but delay. LUE: tr to 0/5. LLE 1/5 hf and 0/5 distally. Senses deep pain stimulation but not light touch. Left central 7, speech sl dysarthric. Follows simple commands. Fair insight and awareness.  Skin: Skin is warm.  Psychiatric:  flat    Lab Results Last 48 Hours        Results for orders placed or performed during the hospital encounter of 05/03/17 (from the past 48 hour(s))  Basic metabolic panel     Status: Abnormal   Collection Time: 05/05/17  6:23 AM  Result Value Ref Range   Sodium 138 135 - 145 mmol/L   Potassium 3.9 3.5 - 5.1 mmol/L    Comment: HEMOLYSIS AT THIS LEVEL MAY AFFECT RESULT   Chloride 110 101 - 111 mmol/L   CO2 18 (L) 22 - 32 mmol/L   Glucose, Bld 81 65 - 99 mg/dL   BUN 11 6 - 20 mg/dL   Creatinine, Ser 0.79 0.44 - 1.00 mg/dL   Calcium 9.3 8.9 - 10.3 mg/dL   GFR calc non Af Amer >60 >60 mL/min   GFR calc Af Amer >60 >60 mL/min    Comment: (NOTE) The eGFR has been calculated using the CKD EPI equation. This calculation has not been validated in all clinical situations. eGFR's persistently <60 mL/min signify possible Chronic Kidney Disease.    Anion gap 10 5 - 15  Glucose, capillary     Status: Abnormal   Collection Time: 05/05/17  8:35 AM  Result Value Ref Range   Glucose-Capillary 104 (H) 65 - 99 mg/dL  Basic metabolic panel     Status: Abnormal   Collection Time: 05/06/17  6:37 AM  Result Value Ref Range    Sodium 136 135 - 145 mmol/L   Potassium 3.5 3.5 - 5.1 mmol/L   Chloride 107 101 - 111 mmol/L   CO2 21 (L) 22 - 32 mmol/L   Glucose, Bld 96 65 - 99 mg/dL   BUN 13 6 - 20 mg/dL   Creatinine, Ser 0.79 0.44 - 1.00 mg/dL   Calcium 9.2 8.9 - 10.3 mg/dL   GFR calc non Af Amer >60 >60 mL/min   GFR calc Af Amer >60 >60 mL/min    Comment: (NOTE) The eGFR has been calculated using the CKD EPI equation. This calculation has not been validated in all clinical situations. eGFR's persistently <60 mL/min signify possible Chronic Kidney Disease.    Anion gap 8 5 - 15  Glucose, capillary     Status: None   Collection Time: 05/06/17  8:35 AM  Result Value Ref Range   Glucose-Capillary 86 65 - 99 mg/dL     Imaging Results (Last 48 hours)  No results found.       Medical Problem List and Plan: 1.  Left hemiparesis and functional deficits secondary to right basal ganglia infarct             -admit to inpatient rehab 2.  DVT Prophylaxis/Anticoagulation: Pharmaceutical:  Lovenox 3. Pain Management: Ice /heat for back with ultram prn.  4. Mood: LCSW to follow for evaluation and support.  5. Neuropsych: This patient is capable of making decisions on her own behalf. 6. Skin/Wound Care: routine pressure relief measures. Elevate RLE when seated.  7. Fluids/Electrolytes/Nutrition: Monitor I/O. Check lytes in am 8. Dyslipidemia: Continue lipitor.  9. HTN: permissive HTN for 3-5 days. Will resume HCTZ if indicated and will likely need Kdur for supplement.  10 ABLA: Recheck CBC in am.      Post Admission Physician Evaluation: 1. Functional deficits secondary  to right basal ganglia infarct. 2. Patient is admitted to receive collaborative, interdisciplinary care between the physiatrist, rehab nursing staff, and therapy team. 3. Patient's level of medical complexity and substantial therapy needs in context of that medical necessity cannot be provided at a lesser intensity of care  such as a SNF. 4. Patient has experienced substantial functional loss from his/her baseline which was documented above under the "Functional History" and "Functional Status" headings.  Judging by the patient's diagnosis, physical exam, and functional history, the patient has potential for functional progress which will result in measurable gains while on inpatient rehab.  These gains will be of substantial and practical use upon discharge  in facilitating mobility and self-care at the household level. 5. Physiatrist will provide 24 hour management of medical needs as well as oversight of the therapy plan/treatment and provide guidance as appropriate regarding the interaction of the two. 6. The Preadmission Screening has been reviewed and patient status is unchanged unless otherwise stated above. 7. 24 hour rehab nursing will assist with bladder management, bowel management, safety, skin/wound care, disease management, medication administration and patient education  and help integrate therapy concepts, techniques,education, etc. 8. PT will assess and treat for/with: Lower extremity strength, range of motion, stamina, balance, functional mobility, safety, adaptive techniques and equipment, NMR, family ed, visual-spatial awareness.   Goals are: min to mod assist. 9. OT will assess and treat for/with: ADL's, functional mobility, safety, upper extremity strength, adaptive techniques and equipment, NMR, visual-spatial awareness, family ed, ego support.   Goals are: min to mod assist. Therapy may proceed with showering this patient. 10. SLP will assess and treat for/with: speech, swallowing, cognition, family ed.  Goals are: mod I. 11. Case Management and Social Worker will assess and treat for psychological issues and discharge planning. 12. Team conference will be held weekly to assess progress toward goals and to determine barriers to discharge. 13. Patient will receive at least 3 hours of therapy per day at  least 5 days per week. 14. ELOS: 20-24 days       15. Prognosis:  excellent     Meredith Staggers, MD, [place] [facility] 05/06/2017  Bary Leriche, Hershal Coria 05/06/2017

## 2017-05-06 NOTE — IPOC Note (Signed)
Overall Plan of Care [facility]) Patient Details Name: Kelly Lowe MRN: [id number] DOB: 03-09-1951  Admitting Diagnosis: Embolic stroke of right basal ganglia [facility])  Hospital Problems: Principal Problem:   Embolic stroke of right basal ganglia ([place]) Active Problems:   Essential hypertension   Benign essential HTN   Slow transit constipation     Functional Problem List: Nursing Bladder, Bowel, Endurance, Medication Management, Nutrition, Pain, Safety  PT Balance, Behavior, Edema, Endurance, Motor, Nutrition, Pain, Safety, Sensory  OT Balance, Motor, Safety  SLP Cognition, Nutrition  TR         Basic ADL's: OT Eating, Grooming, Bathing, Dressing, Toileting     Advanced  ADL's: OT       Transfers: PT Bed Mobility, Bed to Chair, Car, Furniture, Floor  OT Toilet, Metallurgist: PT Ambulation, Emergency planning/management officer, Stairs     Additional Impairments: OT Fuctional Use of Upper Extremity  SLP Swallowing, Social Cognition   Problem Solving, Attention, Awareness  TR      Anticipated Outcomes Item Anticipated Outcome  Self Feeding modified independent  Swallowing  mod I    Basic self-care  min assist  Toileting  min assist   Bathroom Transfers min assist  Bowel/Bladder  Mod assist  Transfers  min assist  Locomotion  mod assist  Communication     Cognition  Supervision   Pain  3 or less  Safety/Judgment  Min assist   Therapy Plan: PT Intensity: Minimum of 1-2 x/day ,45 to 90 minutes PT Frequency: 5 out of 7 days PT Duration Estimated Length of Stay: 3 weeks OT Intensity: Minimum of 1-2 x/day, 45 to 90 minutes OT Frequency: 5 out of 7 days OT Duration/Estimated Length of Stay: 21-24 days SLP Intensity: Minumum of 1-2 x/day, 30 to 90 minutes SLP Frequency: 3 to 5 out of 7 days SLP Duration/Estimated Length of Stay: 21-28 days     Team Interventions: Nursing Interventions Patient/Family Education, Bladder Management, Bowel Management,  Disease Management/Prevention, Medication Management, Dysphagia/Aspiration Precaution Training, Pain Management  PT interventions Ambulation/gait training, Discharge planning, Functional mobility training, Therapeutic Activities, Psychosocial support, Visual/perceptual remediation/compensation, Medical illustrator training, Neuromuscular re-education, Therapeutic Exercise, Wheelchair propulsion/positioning, Cognitive remediation/compensation, DME/adaptive equipment instruction, Pain management, Splinting/orthotics, UE/LE Strength taining/ROM, Community reintegration, Technical sales engineer stimulation, Patient/family education, IT trainer, UE/LE Coordination activities  OT Interventions Training and development officer, Cognitive remediation/compensation, Academic librarian, Engineer, drilling, Disease mangement/prevention, Discharge planning, Functional electrical stimulation, Functional mobility training, Neuromuscular re-education, Psychosocial support, Patient/family education, Pain management, Self Care/advanced ADL retraining, Splinting/orthotics, Therapeutic Activities, Therapeutic Exercise, UE/LE Strength taining/ROM, UE/LE Coordination activities  SLP Interventions Cognitive remediation/compensation, Cueing hierarchy, Dysphagia/aspiration precaution training, Internal/external aids, Functional tasks, Patient/family education  TR Interventions    SW/CM Interventions Discharge Planning, Psychosocial Support, Patient/Family Education   Barriers to Discharge MD  Medical stability and Right foot fracture  Nursing Home environment access/layout, Neurogenic Bowel & Bladder    PT Home environment access/layout 6 STE  OT      SLP      SW       Team Discharge Planning: Destination: PT-Home ,OT- Home , SLP-Home Projected Follow-up: PT-Home health PT, OT-  Home health OT, 24 hour supervision/assistance, SLP-Outpatient SLP, 24 hour supervision/assistance Projected Equipment  Needs: PT-To be determined, OT- 3 in 1 bedside comode, Tub/shower bench, To be determined, SLP-None recommended by SLP Equipment Details: PT- , OT-  Patient/family involved in discharge planning: PT- Patient, Family member/caregiver,  OT-Patient, SLP-Patient, Family member/caregiver  MD ELOS: 20-24 days. Medical Rehab Prognosis:  Good  Assessment: 66 y.o.femalewith history of HTN, right foot fractures 03/2017, who was admitted on 05/03/17 with left sided weakness, right gaze preference and left facial droop. CT head showing age indeterminate right caudate infarct and CTA head/neck revealed intracranial atherosclerosis involving R>L PCA.MRI brain done revealing Non-hemorrhagic infarct in right basal ganglia, punctate infarct in left splenium and moderate underlying small vessel disease.2 D echo done revealing EF 65-70% with no wall abnormality, mild MVR, TVR and PVR. Hip films done due to reports of fall and back pain and showed DDD and no hip fracture or dislocation. 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 Dr. Leonie Man recommends ASA for embolic stroke due to unknown source and will need TEE/loop. TEE without evidence of PFO. Patient with resultant deficits in mobility and self care tasks due to dense left hemiparesis with sensory deficits, right gaze preference, decrease awareness of deficits as well as delayed processing due to higher level deficits. Will set goals for Min A with PT/OT/SLP.   See Team Conference Notes for weekly updates to the plan of care

## 2017-05-06 NOTE — H&P (Deleted)
  The note originally documented on this encounter has been moved the the encounter in which it belongs.  

## 2017-05-06 NOTE — H&P (Addendum)
Physical Medicine and Rehabilitation Admission H&P    Chief Complaint  Patient presents with  . Functional deficits due to stroke with left hemiparesis, right gaze preference and higher level cognitive deficits.      HPI:  Kelly Lowe is a 66 y.o. female with history of HTN, right foot fractures 03/2017;  who was admitted on 05/03/17 with left sided weakness, right gaze preference and left facial droop. History taken from chart review, patient, and sister. CT head reviewed, showing right basal gangial infarct. Per report, age indeterminate right caudate infarct and CTA head/neck revealed intracranial atherosclerosis involving R> L PCA. MRI brain done revealing  Non-hemorrhagic infarct in right basal ganglia, punctate infarct in left splenium and moderate underlying small vessel disease.  2 D echo done revealing EF 65-70% with no wall abnormality, mild MVR, TVR and PVR. Hip films done due to reports of fall and back pain and showed DDD and no hip fracture or dislocation.   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 Dr. Leonie Man recommends ASA for embolic stroke due to unknown source and will  need TEE/loop. TCD without hits or evidence of PFO. Patient with resultant deficits in mobility and self care tasks due to dense left hemiparesis with sensory deficits, right gaze preference, decrease awareness of deficits as well as delayed processing due to higher level deficits. CIR recommended for follow up therapy.    Review of Systems  Constitutional: Negative for fever.  HENT: Negative for hearing loss.   Eyes: Negative for double vision.  Respiratory: Negative for cough.   Cardiovascular: Negative for palpitations.  Gastrointestinal: Negative for nausea.  Genitourinary: Negative for urgency.  Musculoskeletal: Negative for myalgias.  Skin: Negative for rash.  Neurological: Positive  for sensory change, speech change and focal weakness.  Psychiatric/Behavioral: The patient has insomnia.       Past Medical History:  Diagnosis Date  . Hypertension     Past Surgical History:  Procedure Laterality Date  . FOOT SURGERY      History reviewed. No pertinent family history.    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 She  reports that she has been smoking Cigarettes-- 1/2 PPD.   She has a 12.50 pack-year smoking history. She has never used smokeless tobacco. She reports that she drinks about a glass of wine on special occasions/holidays.  She reports that she does not use drugs.    Allergies: No Known Allergies    Medications Prior to Admission  Medication Sig Dispense Refill  . amLODipine (NORVASC) 5 MG tablet Take 5 mg by mouth daily.  1  . BIOTIN PO Take 1 tablet by mouth daily.    . Cyanocobalamin (VITAMIN B 12 PO) Take 1 tablet by mouth daily.    . hydrochlorothiazide (HYDRODIURIL) 12.5 MG tablet Take 12.5 mg by mouth every morning.  1  . ibuprofen (ADVIL,MOTRIN) 200 MG tablet Take 200 mg by mouth every 6 (six) hours as needed for mild pain.    . Multiple Vitamin (MULTIVITAMIN WITH MINERALS) TABS tablet Take 1 tablet by mouth daily.      Drug Regimen Review  Drug regimen was reviewed and remains appropriate with no significant issues identified  Home: Home Living Family/patient expects to be discharged to:: Inpatient rehab Living Arrangements: Alone Available Help at Discharge: Family, Available 24 hours/day Additional Comments: Daughters in room plan to arrange  for pt to stay with one of them   Functional History: Prior Function Level of Independence: Independent with assistive device(s) Comments: ADLs and IADLs. Broke R foot a few weeks ago; must be in boot to stand/walk  Functional Status:  Mobility: Bed Mobility Overal bed mobility: Needs Assistance Bed Mobility: Supine to Sit Supine to sit: Mod assist General bed  mobility comments: In recliner upon arrival Transfers Overall transfer level: Needs assistance Equipment used: 2 person hand held assist Transfers: Sit to/from Stand, Stand Pivot Transfers Sit to Stand: Mod assist, +2 physical assistance Stand pivot transfers: Max assist General transfer comment: Pt required increased time and effort to power-up to full stand. L knee blocked initially but was able to maintain standing without knee block as well.  Ambulation/Gait Ambulation/Gait assistance: Max assist, +2 physical assistance Ambulation Distance (Feet): 5 Feet (x2) Assistive device: 2 person hand held assist Gait Pattern/deviations: Step-to pattern, Decreased stride length, Trunk flexed General Gait Details: VC's for sequencing and general safety. Pt was able to initially perform pre-gait activity while standing at the chair. Max assist provided for weight shift to each side. Pt with grossly flexed trunk but able to make corrective changes with cues for upright posture. Pt took some pivotal steps around from chair to [facility], and then again back to chair at end of session.  Gait velocity: Decreased    ADL: ADL Overall ADL's : Needs assistance/impaired Eating/Feeding: Moderate assistance, Sitting Eating/Feeding Details (indicate cue type and reason): Pt fixed her coffee while sitting in reclienr with support. Pt requiring Max hand over hand assist with use L hand for opening surgar packets and performing bialteral coordination tasks.  Grooming: Moderate assistance, Sitting Grooming Details (indicate cue type and reason): supported sitting Upper Body Bathing: Moderate assistance, Sitting Lower Body Bathing: Maximal assistance, +2 for physical assistance, Sit to/from stand Upper Body Dressing : Moderate assistance, Sitting Lower Body Dressing: Maximal assistance, +2 for physical assistance, Sit to/from stand Functional mobility during ADLs: Maximal assistance (sit<>stand only) General ADL  Comments: Pt demosntrating decreased functional performance. Pt requiring Max hand over hand to use LUE during ADLs. Pt requiring Max A for sit<>stand. Educated family and pt on sensory deficits and to be cautious with hot/cold; pt wanting to drink her coffee.   Cognition: Cognition Overall Cognitive Status: Impaired/Different from baseline Arousal/Alertness: Awake/alert Orientation Level: Oriented X4 Attention: Sustained Sustained Attention: Impaired Sustained Attention Impairment: Functional basic Memory: Appears intact (simple tasks, recall of 4 words) Awareness: Impaired Awareness Impairment: Emergent impairment, Anticipatory impairment Problem Solving: Impaired Problem Solving Impairment: Functional complex Cognition Arousal/Alertness: Awake/alert Behavior During Therapy: WFL for tasks assessed/performed Overall Cognitive Status: Impaired/Different from baseline Area of Impairment: Attention, Problem solving, Following commands Current Attention Level: Selective (L inattention) Following Commands: Follows one step commands with increased time, Follows multi-step commands with increased time, Follows multi-step commands inconsistently Problem Solving: Slow processing, Difficulty sequencing, Requires verbal cues, Requires tactile cues General Comments: Pt able to recall 3/3 words for ST memory testing. Pt abel to name five animals with minimal increased time. Pt requiring Max cues to perform simple math problem and demosntrating difficulty with multi-step tasks   Blood pressure 116/88, pulse (!) 58, temperature 98.6 F (37 C), temperature source Oral, resp. rate 17, height 5' 7"  (1.702 m), weight 87.8 kg (193 lb 8 oz), SpO2 100 %. Physical Exam  Constitutional: She appears well-developed and well-nourished.  Appears fatigued  HENT:  Head: Normocephalic and atraumatic.  Mouth/Throat: No oropharyngeal exudate.  Eyes: Pupils are equal,  round, and reactive to light.  Neck: No  tracheal deviation present. No thyromegaly present.  Cardiovascular: Normal rate and regular rhythm.  Exam reveals no gallop and no friction rub.   No murmur heard. Respiratory: No respiratory distress. She has no wheezes. She has no rales. She exhibits no tenderness.  GI: She exhibits no distension. There is no tenderness. There is no rebound.  Musculoskeletal: She exhibits no edema or deformity.  Neurological:  Pt drowsy, right gaze preference. Does initiate with left side but delay. LUE: tr to 0/5. LLE 1/5 hf and 0/5 distally. Senses deep pain stimulation but not light touch. Left central 7, speech sl dysarthric. Follows simple commands. Fair insight and awareness.  Skin: Skin is warm.  Psychiatric:  flat    Results for orders placed or performed during the hospital encounter of 05/03/17 (from the past 48 hour(s))  Basic metabolic panel     Status: Abnormal   Collection Time: 05/05/17  6:23 AM  Result Value Ref Range   Sodium 138 135 - 145 mmol/L   Potassium 3.9 3.5 - 5.1 mmol/L    Comment: HEMOLYSIS AT THIS LEVEL MAY AFFECT RESULT   Chloride 110 101 - 111 mmol/L   CO2 18 (L) 22 - 32 mmol/L   Glucose, Bld 81 65 - 99 mg/dL   BUN 11 6 - 20 mg/dL   Creatinine, Ser 0.79 0.44 - 1.00 mg/dL   Calcium 9.3 8.9 - 10.3 mg/dL   GFR calc non Af Amer >60 >60 mL/min   GFR calc Af Amer >60 >60 mL/min    Comment: (NOTE) The eGFR has been calculated using the CKD EPI equation. This calculation has not been validated in all clinical situations. eGFR's persistently <60 mL/min signify possible Chronic Kidney Disease.    Anion gap 10 5 - 15  Glucose, capillary     Status: Abnormal   Collection Time: 05/05/17  8:35 AM  Result Value Ref Range   Glucose-Capillary 104 (H) 65 - 99 mg/dL  Basic metabolic panel     Status: Abnormal   Collection Time: 05/06/17  6:37 AM  Result Value Ref Range   Sodium 136 135 - 145 mmol/L   Potassium 3.5 3.5 - 5.1 mmol/L   Chloride 107 101 - 111 mmol/L   CO2 21 (L)  22 - 32 mmol/L   Glucose, Bld 96 65 - 99 mg/dL   BUN 13 6 - 20 mg/dL   Creatinine, Ser 0.79 0.44 - 1.00 mg/dL   Calcium 9.2 8.9 - 10.3 mg/dL   GFR calc non Af Amer >60 >60 mL/min   GFR calc Af Amer >60 >60 mL/min    Comment: (NOTE) The eGFR has been calculated using the CKD EPI equation. This calculation has not been validated in all clinical situations. eGFR's persistently <60 mL/min signify possible Chronic Kidney Disease.    Anion gap 8 5 - 15  Glucose, capillary     Status: None   Collection Time: 05/06/17  8:35 AM  Result Value Ref Range   Glucose-Capillary 86 65 - 99 mg/dL   No results found.     Medical Problem List and Plan: 1.  Left hemiparesis and functional deficits secondary to right basal ganglia infarct  -admit to inpatient rehab 2.  DVT Prophylaxis/Anticoagulation: Pharmaceutical: Lovenox 3. Pain Management: Ice /heat for back with ultram prn.  4. Mood: LCSW to follow for evaluation and support.  5. Neuropsych: This patient is capable of making decisions on her own behalf. 6. Skin/Wound  Care: routine pressure relief measures. Elevate RLE when seated.  7. Fluids/Electrolytes/Nutrition: Monitor I/O. Check lytes in am 8. Dyslipidemia: Continue lipitor.  9. HTN: permissive HTN for 3-5 days. Will resume HCTZ if indicated and will likely need Kdur for supplement.  10 ABLA: Recheck CBC in am.      Post Admission Physician Evaluation: 1. Functional deficits secondary  to right bg infarct. 2. Patient is admitted to receive collaborative, interdisciplinary care between the physiatrist, rehab nursing staff, and therapy team. 3. Patient's level of medical complexity and substantial therapy needs in context of that medical necessity cannot be provided at a lesser intensity of care such as a SNF. 4. Patient has experienced substantial functional loss from his/her baseline which was documented above under the "Functional History" and "Functional Status" headings.  Judging  by the patient's diagnosis, physical exam, and functional history, the patient has potential for functional progress which will result in measurable gains while on inpatient rehab.  These gains will be of substantial and practical use upon discharge  in facilitating mobility and self-care at the household level. 5. Physiatrist will provide 24 hour management of medical needs as well as oversight of the therapy plan/treatment and provide guidance as appropriate regarding the interaction of the two. 6. The Preadmission Screening has been reviewed and patient status is unchanged unless otherwise stated above. 7. 24 hour rehab nursing will assist with bladder management, bowel management, safety, skin/wound care, disease management, medication administration and patient education  and help integrate therapy concepts, techniques,education, etc. 8. PT will assess and treat for/with: Lower extremity strength, range of motion, stamina, balance, functional mobility, safety, adaptive techniques and equipment, NMR, family ed, visual-spatial awareness.   Goals are: min to mod assist. 9. OT will assess and treat for/with: ADL's, functional mobility, safety, upper extremity strength, adaptive techniques and equipment, NMR, visual-spatial awareness, family ed, ego support.   Goals are: min to mod assist. Therapy may proceed with showering this patient. 10. SLP will assess and treat for/with: speech, swallowing, cognition, family ed.  Goals are: mod I. 11. Case Management and Social Worker will assess and treat for psychological issues and discharge planning. 12. Team conference will be held weekly to assess progress toward goals and to determine barriers to discharge. 13. Patient will receive at least 3 hours of therapy per day at least 5 days per week. 14. ELOS: 20-24 days       15. Prognosis:  excellent     Meredith Staggers, MD, [facility] 05/06/2017  Bary Leriche,  Hershal Coria 05/06/2017

## 2017-05-06 NOTE — PMR Pre-admission (Signed)
PMR Admission Coordinator Pre-Admission Assessment  Patient: Kelly Lowe is an 66 y.o., female MRN: [id number] DOB: [birth year] Height: 5\' 7"  (170.2 cm) Weight: 87.8 kg (193 lb 8 oz)              Insurance Information HMO:     PPO: X     PCP:      IPA:      80/20:      OTHER:  PRIMARY: U[employer] Commercial       Policy#: [id number]      Subscriber: Self CM Name: Vevelyn Royals      Phone#: [phone]     Fax#: [phone] Pre-Cert#: [id number]      Employer: Full Time Benefits:  Phone #: [phone]     Name: Verified via online, UHC portal Eff. Date: 08/05/16     Deduct: $800      Out of Pocket Max: $5000      Life Max: N/A CIR: 80%/20%      SNF: 80%/20% Outpatient: PT/OT 30 visits per discipline     Co-Pay: $40 per visit  Home Health: 80%      Co-Pay: 20% DME: 80%     Co-Pay: 20% (no deductible needed) Providers: In-network  Notified [employer] via email to add 05/06/17 SECONDARY: Medicare A      Policy#: [id number]      Subscriber: Self CM Name:       Phone#:      Fax#:  Pre-Cert#: Eligible      Employer:  Benefits:  Phone #: Verified online     Name: [employer] Eff. Date: 08/05/16     Deduct:       Out of Pocket Max:        Life Max:  CIR:       SNF:  Outpatient:      Co-Pay:  Home Health:       Co-Pay:  DME:      Co-Pay:   Medicaid Application Date:       Case Manager:  Disability Application Date:       Case Worker:   Emergency Contact Information Contact Information    Name Relation Home Work Mobile   Shipman,Lauren Daughter [phone]     Jones,Lari Daughter [phone]       Current Medical History  Patient Admitting Diagnosis: Right basal ganglia CVA  History of Present Illness: RORI GOAR a 66 y.o.femalewith history of HTN, right foot fractures 03/2017, who was admitted on 05/03/17 with left sided weakness, right gaze preference, and left facial droop. History taken from chart review, patient, and sister. CT head reviewed, showing right basal  gangial infarct. Per report, age indeterminate right caudate infarct and CTA head/neck revealed intracranial atherosclerosis involving R>L PCA. MRI brain done revealing non-hemorrhagic infarct in right basal ganglia, punctate infarct in left splenium and moderate underlying small vessel disease. 2D echo done revealing EF 65-70% with no wall abnormality, mild MVR, TVR, and PVR. Hip films done due to reports of fall and back pain and showed DDD and no hip fracture or dislocation. 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 Dr. Leonie Man recommends ASA for embolic stroke due to unknown source and may need TEE/loop, which has been scheduled for 05/08/17 at 3pm.  Work up ongoing and therapy done revealing deficits in mobility and self care tasks due to dense left hemiparesis with sensory deficits, right gaze  preference, decrease awareness of deficits as well as delayed processing due to higher level deficits. CIR recommended for follow up therapy and patient admitted 05/06/17.   NIH Total: 12    Past Medical History  Past Medical History:  Diagnosis Date  . Hypertension     Family History  family history is not on file.  Prior Rehab/Hospitalizations:  Has the patient had major surgery during 100 days prior to admission? No  Current Medications   Current Facility-Administered Medications:  .  acetaminophen (TYLENOL) tablet 650 mg, 650 mg, Oral, Q6H PRN, 650 mg at 05/06/17 0503 **OR** acetaminophen (TYLENOL) suppository 650 mg, 650 mg, Rectal, Q6H PRN, Burgess Estelle, MD .  aspirin tablet 325 mg, 325 mg, Oral, Daily, Burgess Estelle, MD, 325 mg at 05/06/17 0941 .  atorvastatin (LIPITOR) tablet 40 mg, 40 mg, Oral, q1800, Svalina, Gorica, MD, 40 mg at 05/05/17 1734 .  heparin injection 5,000 Units, 5,000 Units, Subcutaneous, Q8H, Burgess Estelle, MD, 5,000 Units at 05/06/17 0506 .  iopamidol  (ISOVUE-370) 76 % injection 100 mL, 100 mL, Intravenous, Once PRN, Mesner, Corene Cornea, MD .  ketorolac (TORADOL) 15 MG/ML injection 15 mg, 15 mg, Intravenous, Q8H PRN, Kathi Ludwig, MD, 15 mg at 05/05/17 2218 .  nicotine (NICODERM CQ - dosed in mg/24 hours) patch 14 mg, 14 mg, Transdermal, Daily, Santos-Sanchez, Idalys, MD, 14 mg at 05/06/17 0945 .  protein supplement (PREMIER PROTEIN) liquid, 2 oz, Oral, QID, Lacroce, Hulen Shouts, MD, 2 oz at 05/06/17 0941 .  sodium chloride flush (NS) 0.9 % injection 3 mL, 3 mL, Intravenous, Q12H, Burgess Estelle, MD, 3 mL at 05/06/17 0942  Patients Current Diet: DIET DYS 3 Room service appropriate? Yes; Fluid consistency: Thin  Precautions / Restrictions Precautions Precautions: Fall Precaution Comments: R gaze preference Restrictions Weight Bearing Restrictions: No RLE Weight Bearing: Weight bearing as tolerated Other Position/Activity Restrictions: Must be in boot for any weight bearing   Has the patient had 2 or more falls or a fall with injury in the past year?No  Prior Activity Level Community (5-7x/wk): Prior to admission patient was recovering from a right broken foot; however, prior to that in August she was fully independent and working full time in a clerical position for [employer] Devices/Equipment: Kasandra Knudsen (specify quad or straight)  Prior Device Use: Indicate devices/aids used by the patient prior to current illness, exacerbation or injury? None of the above  Prior Functional Level Prior Function Level of Independence:  (due to recent broken foot; Independent prior to that) Comments: ADLs and IADLs. Broke R foot a few weeks ago; must be in boot to stand/walk  Self Care: Did the patient need help bathing, dressing, using the toilet or eating? Independent  Indoor Mobility: Did the patient need assistance with walking from room to room (with or without device)? Independent  Stairs: Did  the patient need assistance with internal or external stairs (with or without device)? Independent  Functional Cognition: Did the patient need help planning regular tasks such as shopping or remembering to take medications? Independent  Current Functional Level Cognition  Arousal/Alertness: Awake/alert Overall Cognitive Status: Impaired/Different from baseline Current Attention Level: Selective (L inattention) Orientation Level: Oriented X4 Following Commands: Follows one step commands with increased time, Follows multi-step commands with increased time, Follows multi-step commands inconsistently General Comments: Pt able to recall 3/3 words for ST memory testing. Pt abel to name five animals with minimal increased time. Pt requiring Max cues to  perform simple math problem and demosntrating difficulty with multi-step tasks Attention: Sustained Sustained Attention: Impaired Sustained Attention Impairment: Functional basic Memory: Appears intact (simple tasks, recall of 4 words) Awareness: Impaired Awareness Impairment: Emergent impairment, Anticipatory impairment Problem Solving: Impaired Problem Solving Impairment: Functional complex    Extremity Assessment (includes Sensation/Coordination)  Upper Extremity Assessment: LUE deficits/detail LUE Deficits / Details: Brunnstrom stage 1 for hand and arm. No movement. Used as dependent assist during fucntional task and required Max A  LUE Sensation: decreased light touch LUE Coordination: decreased fine motor, decreased gross motor  Lower Extremity Assessment: Defer to PT evaluation LLE Deficits / Details: Hip flexion 3-/5, quad 2+/5, ankle dorsiflexion 1/5; Stood today briefly with no LLE buckling noted    ADLs  Overall ADL's : Needs assistance/impaired Eating/Feeding: Moderate assistance, Sitting Eating/Feeding Details (indicate cue type and reason): Pt fixed her coffee while sitting in reclienr with support. Pt requiring Max hand over  hand assist with use L hand for opening surgar packets and performing bialteral coordination tasks.  Grooming: Moderate assistance, Sitting Grooming Details (indicate cue type and reason): supported sitting Upper Body Bathing: Moderate assistance, Sitting Lower Body Bathing: Maximal assistance, +2 for physical assistance, Sit to/from stand Upper Body Dressing : Moderate assistance, Sitting Lower Body Dressing: Maximal assistance, +2 for physical assistance, Sit to/from stand Functional mobility during ADLs: Maximal assistance (sit<>stand only) General ADL Comments: Pt demosntrating decreased functional performance. Pt requiring Max hand over hand to use LUE during ADLs. Pt requiring Max A for sit<>stand. Educated family and pt on sensory deficits and to be cautious with hot/cold; pt wanting to drink her coffee.     Mobility  Overal bed mobility: Needs Assistance Bed Mobility: Sidelying to Sit, Rolling, Sit to Supine Rolling: Min guard, +2 for safety/equipment (Roll to L) Sidelying to sit: Mod assist, +2 for safety/equipment Supine to sit: Mod assist Sit to supine: Max assist, +2 for safety/equipment General bed mobility comments: Max A to manage BLE during sit>supine    Transfers  Overall transfer level: Needs assistance Equipment used: 2 person hand held assist Transfers: Sit to/from Stand, Stand Pivot Transfers Sit to Stand: Mod assist, +2 physical assistance Stand pivot transfers: Max assist, +2 physical assistance General transfer comment: Pt required increased time and effort to power-up to full stand. L knee blocked initially but was able to maintain standing without knee block as well.     Ambulation / Gait / Stairs / Wheelchair Mobility  Ambulation/Gait Ambulation/Gait assistance: Max assist, +2 physical assistance Ambulation Distance (Feet): 5 Feet (x2) Assistive device: 2 person hand held assist Gait Pattern/deviations: Step-to pattern, Decreased stride length, Trunk  flexed General Gait Details: VC's for sequencing and general safety. Pt was able to initially perform pre-gait activity while standing at the chair. Max assist provided for weight shift to each side. Pt with grossly flexed trunk but able to make corrective changes with cues for upright posture. Pt took some pivotal steps around from chair to [facility], and then again back to chair at end of session.  Gait velocity: Decreased    Posture / Balance Balance Overall balance assessment: Needs assistance Sitting-balance support: Feet supported, Single extremity supported Sitting balance-Leahy Scale: Poor Standing balance support: During functional activity Standing balance-Leahy Scale: Poor    Special needs/care consideration BiPAP/CPAP: No CPM: No Continuous Drip IV: No Dialysis: No         Life Vest: No Oxygen: No Special Bed: Low bed candidate  Trach Size: No Wound Vac (area): No Skin:  Bruising right and left arm and thigh                               Bowel mgmt: Continent, last BM 05/02/17 Bladder mgmt: Incontinent, catheter placed 05/04/17 Diabetic mgmt: HgbA1c - 5.4     Previous Home Environment Living Arrangements: Alone Available Help at Discharge: Family, Available 24 hours/day Home Care Services: No Additional Comments: Daughters in room plan to arrange for pt to stay with one of them  Discharge Living Setting Plans for Discharge Living Setting: Lives with (comment) (Will be discharging to daughter Lari's house in [place]) Type of Home at Discharge: House Discharge Home Layout: Two level, Able to live on main level with bedroom/bathroom Alternate Level Stairs-Number of Steps: 1 flight  Discharge Home Access: Stairs to enter (can ramp if needed with notice ) Entrance Stairs-Rails: Right, Left (can only reach one at a time) Entrance Stairs-Number of Steps: 6  Discharge Bathroom Shower/Tub: Walk-in shower, Other (comment) (with set and a moble shower head) Discharge Bathroom Toilet:  Standard Discharge Bathroom Accessibility: Yes How Accessible: Accessible via walker (TBD if wheelchair accessible if needed) Does the patient have any problems obtaining your medications?: No  Social/Family/Support Systems Patient Roles: Parent, Other (Comment) (Grandparent ) Contact Information: Daughter: Kingsley Spittle [phone]; Daughter: Marlou Porch [phone] Anticipated Caregiver: [name] Anticipated Caregiver's Contact Information: see above Ability/Limitations of Caregiver: None, but she lives in [place] and patient will discharge to her home there Caregiver Availability: 24/7 Discharge Plan Discussed with Primary Caregiver: Yes Is Caregiver In Agreement with Plan?: Yes Does Caregiver/Family have Issues with Lodging/Transportation while Pt is in Rehab?: No  Goals/Additional Needs Patient/Family Goal for Rehab: PT; Min-Mod A; OT: Min A; SLP: Mod I Expected length of stay: 20-24 days  Cultural Considerations: None Dietary Needs: Dys.3 textures and thin liquids with heart healthy restrictions  Equipment Needs: TBD Special Service Needs: None Additional Information: None Pt/Family Agrees to Admission and willing to participate: Yes Program Orientation Provided & Reviewed with Pt/Caregiver Including Roles  & Responsibilities: Yes  Barriers to Discharge: Inaccessible home environment (family will need notice if a ramp will be needed )  Decrease burden of Care through IP rehab admission: No  Possible need for SNF placement upon discharge: No  Patient Condition: This patient's condition remains as documented in the consult dated 05/05/17, in which the Rehabilitation Physician determined and documented that the patient's condition is appropriate for intensive rehabilitative care in an inpatient rehabilitation facility. Will admit to inpatient rehab today.  Preadmission Screen Completed By:  Gunnar Fusi, 05/06/2017 3:00  PM ______________________________________________________________________   Discussed status with Dr. Naaman Plummer on 05/06/17 at 1525 and received telephone approval for admission today.  Admission Coordinator:  Gunnar Fusi, time 1525 Sudie Grumbling 05/06/17

## 2017-05-06 NOTE — Progress Notes (Signed)
   Subjective: Patient seen and examined. She states her weakness has not changed since yesterday. She is able to lift her proximal left leg, but still unable to move her left foot, left hand, or left arm. She has no other acute complaints.   Objective:  Vital signs in last 24 hours: Vitals:   05/05/17 0413 05/05/17 0540 05/05/17 1300 05/06/17 0450  BP: (!) 145/63 (!) 147/63 (!) 143/65 116/88  Pulse: 65 (!) 55 63 (!) 58  Resp: 17 17 17 17   Temp: 99.3 F (37.4 C) 98.4 F (36.9 C) 98.3 F (36.8 C) 98.6 F (37 C)  TempSrc: Oral Oral Oral Oral  SpO2: 96% 98% 98% 100%  Weight:      Height:       General: Sitting up in bed comfortably, NAD HEENT: /AT, EOMI, no scleral icterus Cardiac: RRR, No R/M/G appreciated Pulm: normal effort, CTAB Abd: soft, non tender, non distended, BS normal Ext: extremities well perfused, no peripheral edema, patient has boot on RLE due to foot fracture Neuro: alert and oriented X3,  left facial droop noted, 0/5 left upper extremity, 1/5 proximal LLE, 0/5 distal lower extremity, right gaze preference, no dysarthria, sensation is intact in all four extremities    Assessment/Plan:  Principal Problem:   CVA (cerebral vascular accident) ([place]) Active Problems:   Essential hypertension   Tobacco use   Cerebral infarction ([place])   Pain   Hypokalemia   Acute blood loss anemia  CVA of R basal ganglia and L splenium Patient presented with left sided hemiplegia, L facial droop and right gaze preference and did not received tPA because she was outside the window. MRI revealed acute ischemic nonhemorrhagic right basal ganglia infarct and a punctate ischemic infarct within the left splenium, concerning for embolic stroke. TTE was completed and was normal with EF of 65-70%, with no wall motion abnormalities. Preliminary read of lower extremity ultrasound showed a small focal area of intermuscular thrombus in the mid right gastrocnemius vein, other visualized veins of  the lower extremities appear negative for deep vein thrombosis.  Today, her exam is unchanged from yesterday, she is able to raise her left leg an inch or two, but is still unable to move her foot, left upper extremity and hand, continues to have right gaze preference and left sided facial droop. Awaiting CIR placement.  - Aspirin 81 mg QD + atorvastatin 40 mg QD  - Will allow permissive HTN up to 220/120  - Follow up Transcranial doppler w/ bubble study -TEE scheduled  - On telemetry   Hypokalemia K 3.9 yesterday >>3.5 this morning -Replete as needed -BMP tomorrow   Lower back pain - IV Toradol 15 mg q8h PRN for pain   HTN On amlodipine 5 mg QD and HCTZ 12.5 mg QD at home.  - Holding antihypertensive medications and allow permissive HTN as above    F: none  E: will monitor and replete as needed  N: Dysphagia 3 diet   VTE ppx: SQ heparin   Code status: Full code, confirmed on admission   Dispo: Anticipated discharge in approximately 1-2 day(s) to CIR.  Melanee Spry, MD 05/06/2017, 11:15 AM Pager: [phone]

## 2017-05-06 NOTE — Progress Notes (Signed)
STROKE TEAM PROGRESS NOTE   HISTORY OF PRESENT ILLNESS (per record) Kelly Lowe is a 66 y.o. female last in her normal state of health last night around 11 which went to bed.. She awoke with left-sided weakness. Due to the presence of gaze deviation and weakness, code stroke was activated and she was taken for CT perfusion/angiogram. This was negative for large vessel occlusion.   LKW: Last night prior to bed tpa given?: no, outside of window   SUBJECTIVE (INTERVAL HISTORY) Her 2 daughters are at bedside. Patient seems to be improving. She has no new complaints today   OBJECTIVE Temp:  [98.6 F (37 C)] 98.6 F (37 C) (10/02 0450) Pulse Rate:  [58] 58 (10/02 0450) Cardiac Rhythm: Sinus bradycardia;Normal sinus rhythm (10/02 1000) Resp:  [17] 17 (10/02 0450) BP: (116)/(88) 116/88 (10/02 0450) SpO2:  [100 %] 100 % (10/02 0450)  CBC:   Recent Labs Lab 05/03/17 0824 05/03/17 0839 05/04/17 0535  WBC 7.8  --  5.2  NEUTROABS 5.9  --   --   HGB 12.3 12.9 10.7*  HCT 37.0 38.0 32.7*  MCV 99.2  --  98.2  PLT 212  --  371    Basic Metabolic Panel:   Recent Labs Lab 05/05/17 0623 05/06/17 0637  NA 138 136  K 3.9 3.5  CL 110 107  CO2 18* 21*  GLUCOSE 81 96  BUN 11 13  CREATININE 0.79 0.79  CALCIUM 9.3 9.2    Lipid Panel:     Component Value Date/Time   CHOL 174 05/04/2017 0535   TRIG 153 (H) 05/04/2017 0535   HDL 29 (L) 05/04/2017 0535   CHOLHDL 6.0 05/04/2017 0535   VLDL 31 05/04/2017 0535   LDLCALC 114 (H) 05/04/2017 0535   HgbA1c:  Lab Results  Component Value Date   HGBA1C 5.4 05/04/2017   Urine Drug Screen:     Component Value Date/Time   LABOPIA NONE DETECTED 05/03/2017 1222   COCAINSCRNUR NONE DETECTED 05/03/2017 1222   LABBENZ NONE DETECTED 05/03/2017 1222   AMPHETMU NONE DETECTED 05/03/2017 1222   THCU NONE DETECTED 05/03/2017 1222   LABBARB NONE DETECTED 05/03/2017 1222    Alcohol Level     Component Value Date/Time   ETH <10  05/03/2017 0824    IMAGING  Ct Head Code Stroke Wo Contrast 05/03/2017 IMPRESSION:  1. Age indeterminate right caudate head infarction with a lenticulostriate pattern. ASPECTS is 9.  2. Negative for acute hemorrhage.  3. Chronic small vessel ischemia.    Ct Angio Head W Or Wo Contrast Ct Angio Neck W Or Wo Contrast Ct Cerebral Perfusion W Contrast 05/03/2017 1. Negative cerebral perfusion imaging.  2. No emergent large vessel occlusion.  3. Intracranial atherosclerosis with most notable involvement of right greater than left PCA branch vessels. No flow limiting proximal stenosis.  4. Widely patent cervical carotid and vertebral arteries.    Mr Brain Wo Contrast 05/03/2017 IMPRESSION:  1. 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  2. Additional punctate ischemic infarct within the left splenium.  3. Generalized age-related cerebral atrophy with underlying moderate chronic microvascular ischemic disease.    Dg Lumbar Spine Complete 05/03/2017 IMPRESSION: No evidence of acute lumbar spine injury.   Facet hypertrophy noted.    Dg Hips Bilat With Pelvis 3-4 Views 05/03/2017 IMPRESSION:  No hip fracture or dislocation.     PHYSICAL EXAM Vitals:   05/05/17 0413 05/05/17 0540 05/05/17 1300 05/06/17 0450  BP: (!) 145/63 (!) 147/63 (!) 143/65 116/88  Pulse: 65 (!) 55 63 (!) 58  Resp: 17 17 17 17   Temp: 99.3 F (37.4 C) 98.4 F (36.9 C) 98.3 F (36.8 C) 98.6 F (37 C)  TempSrc: Oral Oral Oral Oral  SpO2: 96% 98% 98% 100%  Weight:      Height:       PHYSICAL EXAM Physical exam: Exam: frail elderly lady not in distress Gen: NAD Eyes: anicteric sclerae, moist conjunctivae                    CV: no MRG, no carotid bruits, no peripheral edema Mental Status: Alert, follows commands, good historian  Neuro: Detailed Neurologic Exam  Speech:    No aphasia, no dysarthria  Cranial Nerves:    The pupils are equal, round, and reactive  to light.. Attempted, Fundi not visualized.  EOMI. Right gaze preference but can cross the midline, can track, blinks on the left, does not blink to threat on the right, right facial droop, face sensation intact, Tongue and uvula midline. Hearing intact to voice. Shoulder shrug intact  Motor Observation:    no involuntary movements noted. Tone appears normal.     Strength:    Left  Dense flaccid hemiplegia 0/5. Normal strength on the right     Sensation:  Intact to LT but slight left hemi-neglect  Plantars downgoing.    ASSESSMENT/PLAN [name] is a 66 y.o. female with history of hypertension presenting with left sided weakness. She did not receive IV t-PA due to late presentation.  Stroke:  right basal ganglia infarct - additional punctate infarct within the left splenium - embolic unknown source but reports recent right leg calf swelling and immobility due to right leg injury  Resultant  Hemiplegia, facial droop, gaze preference, right neglect  CT head - Age indeterminate right caudate head infarction  MRI head - Ischemic nonhemorrhagic right basal ganglia infarct. Ischemic infarct within the left splenium.   MRA head - not performed.  CTA H&N / Perfusion - Negative cerebral perfusion imaging. No emergent large vessel occlusion.  Carotid Doppler - CTA neck 2D Echo - Left ventricle: The cavity size was normal. There was mild focal   basal hypertrophy of the septum. Systolic function was vigorous.   The estimated ejection fraction was in the range of 65% to 70%.   Wall motion was normal; there were no regional wall motion   abnormalities. Left ventricular diastolic function parameters    were normal. Needs TEE and loop  B LE Venous Dopplers - small gastrocnemius vein clot  LDL - 114  HgbA1c - 5.4  VTE prophylaxis - Reid Heparin DIET DYS 3 Room service appropriate? Yes; Fluid consistency: Thin  No antithrombotic prior to admission, now on aspirin 325 mg  daily  Patient counseled to be compliant with her antithrombotic medications  Ongoing aggressive stroke risk factor management  Therapy recommendations: CIR recommended.  Disposition: Pending  Hypertension  Blood pressure tends to run low. (no anti-htn meds)  Permissive hypertension (OK if < 220/120) but gradually normalize in 5-7 days  Long-term BP goal normotensive  Hyperlipidemia  Home meds:  No lipid lowering medications prior to admission  LDL 114, goal < 70  Now on Lipitor 40 mg daily.  Continue statin at discharge  Diabetes  HgbA1c 5.4, goal < 7.0  Controlled  Other Stroke Risk Factors  Advanced age  Cigarette smoker - advised to stop smoking  ETOH  use, advised to drink no more than 1 drink per day.  Obesity, Body mass index is 30.31 kg/m., recommend weight loss, diet and exercise as appropriate    Other Active Problems  Hypokalemia - supplement has been ordered.  Anemia  Hospital day # 2  I have personally examined this patient, reviewed notes, independently viewed imaging studies, participated in medical decision making and plan of care.ROS completed by me personally and pertinent positives fully documented  I have made any additions or clarifications directly to the above note. She has embolic right MCA branch infarct from unidentified source. Recent foot injury and limited walking raises possibility of DVT and paradoxical embolism. She has gastrocnemius vein clot which is infrapopliteal and the risk-benefit of anticoagulation and not in favor of starting long-term anticoagulation according to literature. Recommend check transcranial Doppler bubble study for PFO and check TEE. Long discussion with daughter is at the bedside regarding stroke evaluation and treatment plan discussion and answered questions.I spent   25 minutes in total face-to-face time with the patient, more than 50% of which was spent in counseling and coordination of care, reviewing test  results, reviewing medication and discussing or reviewing the diagnosis of    , the prognosis and treatment options.    Antony Contras, MD Medical Director [facility] Pager: [phone] 05/06/2017 1:14 PM      To contact Stroke Continuity provider, please refer to [email or web address] After hours, contact General Neurology

## 2017-05-06 NOTE — Progress Notes (Signed)
  Speech Language Pathology Treatment: Dysphagia;Cognitive-Linquistic  Patient Details Name: Kelly Lowe MRN: [id number] DOB: [birth year] Today's Date: 05/06/2017 Time: 1022-1057 SLP Time Calculation (min) (ACUTE ONLY): 35 min  Assessment / Plan / Recommendation Clinical Impression  Pt still has limited intake, which she and her family report is due to distaste for food on trays, not because of difficulty with current textures. She has her upper dentition in place today and needs only Min cues for clearance of pocketed material in her L buccal cavity with advanced solids. Her family also does an excellent job of monitoring her oral cavity. Recommend further advancement to Dys 3 solids, continuing thin liquids. Cognitively she shows improving intellectual awareness of her physical abilities, but she is still labile and needs Mod cues to attend to her left environment. She remains an excellent candidate for CIR.   HPI HPI: Kelly Lowe is a 66 y.o. female with a history of HTN and tobacco abuse who presented to the ED with L sided weakness. 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 Per RN notes, pt failed swallow screen in ED.      SLP Plan  Continue with current plan of care       Recommendations  Diet recommendations: Dysphagia 3 (mechanical soft);Thin liquid Liquids provided via: Cup;No straw Medication Administration: Whole meds with puree Supervision: Patient able to self feed;Full supervision/cueing for compensatory strategies Compensations: Slow rate;Small sips/bites;Lingual sweep for clearance of pocketing Postural Changes and/or Swallow Maneuvers: Seated upright 90 degrees;Upright 30-60 min after meal                Oral Care Recommendations: Oral care BID Follow up Recommendations: Inpatient Rehab SLP Visit Diagnosis: Cognitive communication deficit  (R41.841);Dysphagia, oral phase (R13.11) Plan: Continue with current plan of care       GO                Kelly Lowe 05/06/2017, 11:06 AM  Kelly Lowe, M.A. CCC-SLP [phone]

## 2017-05-06 NOTE — Progress Notes (Signed)
Physical Therapy Treatment Patient Details Name: Kelly Lowe MRN: [id number] DOB: [birth year] Today's Date: 05/06/2017    History of Present Illness 66 y.o. female with a history of HTN and tobacco abuse who presents to the ED with L sided weakness that started this morning. 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    PT Comments    Pt  Very motivated with therapy, working on increasing attention to L side and sitting and standing balance. SPT 2x with mod A +2, needing facilitation of LLE during transfer. Pt c/o L lateral foot pain, base of 5th MT where she has a significant callus. Sister reports she was supposed to see podiatrist to have it managed but was unable to keep appt. L foot inverts in [place] which is further putting pressure in that spot. PT will continue to follow.   Follow Up Recommendations  CIR     Equipment Recommendations  Other (comment) (TBD)    Recommendations for Other Services Rehab consult     Precautions / Restrictions Precautions Precautions: Fall Precaution Comments: R gaze preference Restrictions Weight Bearing Restrictions: No RLE Weight Bearing: Weight bearing as tolerated Other Position/Activity Restrictions: Must be in boot for any weight bearing    Mobility  Bed Mobility Overal bed mobility: Needs Assistance Bed Mobility: Sidelying to Sit;Rolling;Sit to Supine Rolling: Min guard;+2 for safety/equipment Sidelying to sit: Mod assist;+2 for safety/equipment   Sit to supine: Max assist;+2 for physical assistance   General bed mobility comments: pt able to roll to L with min-guard A and use of rail. Max A to manage BLE with sup <> sit and +2 for managament of trunk  Transfers Overall transfer level: Needs assistance Equipment used: 2 person hand held assist Transfers: Sit to/from [facility] Sit to Stand: Mod assist;+2 physical  assistance Stand pivot transfers: Max assist;+2 physical assistance       General transfer comment: Pt required increased time and effort to power-up to full stand. L knee blocked initially but was able to maintain standing without knee block as well. Needs facilitation of LLE movement during transfer. Transferred bot bed to chair and chair back to bed to go down for a test.   Ambulation/Gait             General Gait Details: NT, transport present to take pt   Stairs            Wheelchair Mobility    Modified Rankin (Stroke Patients Only) Modified Rankin (Stroke Patients Only) Pre-Morbid Rankin Score: No significant disability Modified Rankin: Severe disability     Balance Overall balance assessment: Needs assistance Sitting-balance support: Feet supported;Single extremity supported Sitting balance-Leahy Scale: Poor Sitting balance - Comments: worked on leaning onto R elbow and then L and righting self from each side. Also worked on attending to L side in sitting Postural control: Right lateral lean Standing balance support: During functional activity Standing balance-Leahy Scale: Poor                              Cognition Arousal/Alertness: Awake/alert Behavior During Therapy: WFL for tasks assessed/performed Overall Cognitive Status: Impaired/Different from baseline Area of Impairment: Attention;Problem solving;Following commands                   Current Attention Level: Selective   Following Commands: Follows one step commands with increased time;Follows multi-step commands with  increased time;Follows multi-step commands inconsistently     Problem Solving: Slow processing;Difficulty sequencing;Requires verbal cues;Requires tactile cues General Comments: L inattention but able to cross midline with gaze and manage LUE with R hand when cued      Exercises      General Comments General comments (skin integrity, edema, etc.): pt's  sister present for session, education for stimulating left side      Pertinent Vitals/Pain Pain Assessment: Faces Faces Pain Scale: Hurts even more Pain Location: L foot with touch or WB'ing Pain Descriptors / Indicators: Aching;Burning Pain Intervention(s): Limited activity within patient's tolerance;Monitored during session    Home Living                      Prior Function Level of Independence:  (due to recent broken foot; Independent prior to that)          PT Goals (current goals can now be found in the care plan section) Acute Rehab PT Goals Patient Stated Goal: Get better and go home PT Goal Formulation: With patient Time For Goal Achievement: 05/18/17 Potential to Achieve Goals: Good Progress towards PT goals: Progressing toward goals    Frequency    Min 4X/week      PT Plan Current plan remains appropriate    Co-evaluation PT/OT/SLP Co-Evaluation/Treatment: Yes Reason for Co-Treatment: Complexity of the patient's impairments (multi-system involvement);For patient/therapist safety PT goals addressed during session: Mobility/safety with mobility;Balance;Strengthening/ROM        AM-PAC PT "6 Clicks" Daily Activity  Outcome Measure  Difficulty turning over in bed (including adjusting bedclothes, sheets and blankets)?: Unable Difficulty moving from lying on back to sitting on the side of the bed? : Unable Difficulty sitting down on and standing up from a chair with arms (e.g., wheelchair, bedside commode, etc,.)?: Unable Help needed moving to and from a bed to chair (including a wheelchair)?: A Lot Help needed walking in hospital room?: A Lot Help needed climbing 3-5 steps with a railing? : Total 6 Click Score: 8    End of Session Equipment Utilized During Treatment: Gait belt Activity Tolerance: Patient tolerated treatment well Patient left: in bed;with call bell/phone within reach Nurse Communication: Mobility status PT Visit Diagnosis:  Hemiplegia and hemiparesis Hemiplegia - Right/Left: Left Hemiplegia - dominant/non-dominant: Non-dominant Hemiplegia - caused by: Cerebral infarction     Time: 1330-1405 PT Time Calculation (min) (ACUTE ONLY): 35 min  Charges:  $Therapeutic Activity: 8-22 mins                    G Codes:       Leighton Roach, PT  Acute Rehab Services  [place] 05/06/2017, 4:14 PM

## 2017-05-06 NOTE — Progress Notes (Signed)
Inpatient Rehabilitation  I have received insurance authorization, acute medical clearance, and rehab department and medical director approval to admit patient to IP Rehab prior to next available TEE slot on 10/4 at 3pm.  Will proceed with IP Rehab admission today.  I have updated patient's daughters and nurse case Freight forwarder.  Call if questions.   Carmelia Roller., CCC/SLP Admission Coordinator  [facility]  Cell [phone]

## 2017-05-06 NOTE — Progress Notes (Signed)
VASCULAR LAB PRELIMINARY  PRELIMINARY  PRELIMINARY  PRELIMINARY  Transcranial Doppler with bubbles  completed.    Preliminary report:  No Hits noted, No apparent PFO  Kalila Adkison, RVS 05/06/2017, 2:55 PM

## 2017-05-07 ENCOUNTER — Inpatient Hospital Stay (HOSPITAL_COMMUNITY): Payer: Medicare Other | Admitting: Occupational Therapy

## 2017-05-07 ENCOUNTER — Inpatient Hospital Stay (HOSPITAL_COMMUNITY): Payer: Medicare Other | Admitting: Speech Pathology

## 2017-05-07 ENCOUNTER — Inpatient Hospital Stay (HOSPITAL_COMMUNITY): Payer: 59

## 2017-05-07 ENCOUNTER — Inpatient Hospital Stay (HOSPITAL_COMMUNITY): Payer: Medicare Other

## 2017-05-07 DIAGNOSIS — K5901 Slow transit constipation: Secondary | ICD-10-CM

## 2017-05-07 DIAGNOSIS — D62 Acute posthemorrhagic anemia: Secondary | ICD-10-CM

## 2017-05-07 DIAGNOSIS — I10 Essential (primary) hypertension: Secondary | ICD-10-CM

## 2017-05-07 LAB — COMPREHENSIVE METABOLIC PANEL
ALBUMIN: 3.4 g/dL — AB (ref 3.5–5.0)
ALT: 32 U/L (ref 14–54)
AST: 47 U/L — AB (ref 15–41)
Alkaline Phosphatase: 45 U/L (ref 38–126)
Anion gap: 10 (ref 5–15)
BILIRUBIN TOTAL: 0.7 mg/dL (ref 0.3–1.2)
BUN: 12 mg/dL (ref 6–20)
CHLORIDE: 107 mmol/L (ref 101–111)
CO2: 21 mmol/L — ABNORMAL LOW (ref 22–32)
Calcium: 9.3 mg/dL (ref 8.9–10.3)
Creatinine, Ser: 0.84 mg/dL (ref 0.44–1.00)
GFR calc Af Amer: >60 mL/min (ref >60)
GFR calc non Af Amer: >60 mL/min (ref >60)
GLUCOSE: 116 mg/dL — AB (ref 65–99)
POTASSIUM: 3.8 mmol/L (ref 3.5–5.1)
Sodium: 138 mmol/L (ref 135–145)
Total Protein: 7.5 g/dL (ref 6.5–8.1)

## 2017-05-07 LAB — CBC WITH DIFFERENTIAL/PLATELET
BASOS PCT: 0 %
Basophils Absolute: 0 10*3/uL (ref 0.0–0.1)
EOS ABS: 0.1 10*3/uL (ref 0.0–0.7)
EOS PCT: 2 %
HCT: 33.2 % — ABNORMAL LOW (ref 36.0–46.0)
HEMOGLOBIN: 11.1 g/dL — AB (ref 12.0–15.0)
LYMPHS ABS: 2.1 10*3/uL (ref 0.7–4.0)
Lymphocytes Relative: 34 %
MCH: 32.6 pg (ref 26.0–34.0)
MCHC: 33.4 g/dL (ref 30.0–36.0)
MCV: 97.6 fL (ref 78.0–100.0)
MONOS PCT: 6 %
Monocytes Absolute: 0.4 10*3/uL (ref 0.1–1.0)
NEUTROS PCT: 58 %
Neutro Abs: 3.6 10*3/uL (ref 1.7–7.7)
PLATELETS: 174 10*3/uL (ref 150–400)
RBC: 3.4 MIL/uL — ABNORMAL LOW (ref 3.87–5.11)
RDW: 12.4 % (ref 11.5–15.5)
WBC: 6.2 10*3/uL (ref 4.0–10.5)

## 2017-05-07 MED ORDER — POLYVINYL ALCOHOL 1.4 % OP SOLN
1.0000 [drp] | OPHTHALMIC | Status: DC | PRN
  Filled 2017-05-07: qty 15

## 2017-05-07 MED ORDER — SENNOSIDES-DOCUSATE SODIUM 8.6-50 MG PO TABS
2.0000 | ORAL_TABLET | Freq: Two times a day (BID) | ORAL | Status: DC
  Administered 2017-05-07 – 2017-05-16 (×15): 2 via ORAL
  Administered 2017-05-18 (×2): 1 via ORAL
  Administered 2017-05-19 – 2017-05-20 (×4): 2 via ORAL
  Filled 2017-05-07 (×26): qty 2

## 2017-05-07 NOTE — Progress Notes (Signed)
    [facility] has been requested to perform a transesophageal echocardiogram on Kelly Lofty for Stroke.  After careful review of history and examination, the risks and benefits of transesophageal echocardiogram have been explained including risks of esophageal damage, perforation (1:10,000 risk), bleeding, pharyngeal hematoma as well as other potential complications associated with conscious sedation including aspiration, arrhythmia, respiratory failure and death. Alternatives to treatment were discussed, questions were answered. 2 daughters present. Patient is willing to proceed.   The procedure is scheduled for tomorrow, 05/08/17, at 3:00 pm with Dr. Stanford Breed.  Daune Perch, NP  05/07/2017 3:36 PM

## 2017-05-07 NOTE — Progress Notes (Signed)
Patient information reviewed and entered into eRehab system by Kathern Lobosco, RN, CRRN, PPS Coordinator.  Information including medical coding and functional independence measure will be reviewed and updated through discharge.     Per nursing patient was given "Data Collection Information Summary for Patients in Inpatient Rehabilitation Facilities with attached "Privacy Act Statement-Health Care Records" upon admission.  

## 2017-05-07 NOTE — Progress Notes (Signed)
[facility] Admission Coordinator Signed Physical Medicine and Rehabilitation  PMR Pre-admission Date of Service: 05/06/2017 3:00 PM  Related encounter: ED to Hosp-Admission (Discharged) from 05/03/2017 in [place]       [] Hide copied text PMR Admission Coordinator Pre-Admission Assessment  Patient: Kelly Lowe is an 66 y.o., female MRN: [id number] DOB: [birth year] Height: 5\' 7"  (170.2 cm) Weight: 87.8 kg (193 lb 8 oz)                                                                                                                                                  Insurance Information HMO:     PPO: X     PCP:      IPA:      80/20:      OTHER:  PRIMARY: UHC Commercial       Policy#: [id number]      Subscriber: Self CM Name: Vevelyn Royals      Phone#: [phone]     Fax#: [phone] Pre-Cert#: [id number]      Employer: Full Time Benefits:  Phone #: [phone]     Name: Verified via online, UHC portal Eff. Date: 08/05/16     Deduct: $800      Out of Pocket Max: $5000      Life Max: N/A CIR: 80%/20%      SNF: 80%/20% Outpatient: PT/OT 30 visits per discipline     Co-Pay: $40 per visit  Home Health: 80%      Co-Pay: 20% DME: 80%     Co-Pay: 20% (no deductible needed) Providers: In-network  Notified [employer] via email to add 05/06/17 SECONDARY: Medicare A      Policy#: [id number]      Subscriber: Self CM Name:       Phone#:      Fax#:  Pre-Cert#: Eligible      Employer:  Benefits:  Phone #: Verified online     Name: Pass[employer] One Eff. Date: 08/05/16     Deduct:       Out of Pocket Max:        Life Max:  CIR:       SNF:  Outpatient:      Co-Pay:  Home Health:       Co-Pay:  DME:      Co-Pay:   Medicaid Application Date:       Case Manager:  Disability Application Date:       Case Worker:   Emergency Contact Information        Contact Information    Name Relation Home Work Mobile   Shipman,Lauren Daughter [phone]      Jones,Lari Daughter [phone]       Current Medical History  Patient Admitting Diagnosis: Right basal ganglia CVA  History of Present Illness: Kelly Lowe a 66  y.o.femalewith history of HTN, right foot fractures 03/2017, who was admitted on 05/03/17 with left sided weakness, right gaze preference, and left facial droop. History taken from chart review, patient, and sister. CT head reviewed, showing right basal gangial infarct. Per report, age indeterminate right caudate infarct and CTA head/neck revealed intracranial atherosclerosis involving R>L PCA. MRI brain done revealing non-hemorrhagic infarct in right basal ganglia, punctate infarct in left splenium and moderate underlying small vessel disease. 2D echo done revealing EF 65-70% with no wall abnormality, mild MVR, TVR, and PVR. Hip films done due to reports of fall and back pain and showed DDD and no hip fracture or dislocation. 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 Dr. Leonie Man recommends ASA for embolic stroke due to unknown source and may need TEE/loop, which has been scheduled for 05/08/17 at 3pm.  Work up ongoing and therapy done revealing deficits in mobility and self care tasks due to dense left hemiparesis with sensory deficits, right gaze preference, decrease awareness of deficits as well as delayed processing due to higher level deficits. CIR recommended for follow up therapy and patient admitted 05/06/17.   NIH Total: 12  Past Medical History      Past Medical History:  Diagnosis Date  . Hypertension     Family History  family history is not on file.  Prior Rehab/Hospitalizations:  Has the patient had major surgery during 100 days prior to admission? No  Current Medications   Current Facility-Administered Medications:  .  acetaminophen (TYLENOL) tablet 650 mg, 650 mg, Oral, Q6H PRN, 650 mg  at 05/06/17 0503 **OR** acetaminophen (TYLENOL) suppository 650 mg, 650 mg, Rectal, Q6H PRN, Burgess Estelle, MD .  aspirin tablet 325 mg, 325 mg, Oral, Daily, Burgess Estelle, MD, 325 mg at 05/06/17 0941 .  atorvastatin (LIPITOR) tablet 40 mg, 40 mg, Oral, q1800, Svalina, Gorica, MD, 40 mg at 05/05/17 1734 .  heparin injection 5,000 Units, 5,000 Units, Subcutaneous, Q8H, Burgess Estelle, MD, 5,000 Units at 05/06/17 0506 .  iopamidol (ISOVUE-370) 76 % injection 100 mL, 100 mL, Intravenous, Once PRN, Mesner, Corene Cornea, MD .  ketorolac (TORADOL) 15 MG/ML injection 15 mg, 15 mg, Intravenous, Q8H PRN, Kathi Ludwig, MD, 15 mg at 05/05/17 2218 .  nicotine (NICODERM CQ - dosed in mg/24 hours) patch 14 mg, 14 mg, Transdermal, Daily, Santos-Sanchez, Idalys, MD, 14 mg at 05/06/17 0945 .  protein supplement (PREMIER PROTEIN) liquid, 2 oz, Oral, QID, Lacroce, Hulen Shouts, MD, 2 oz at 05/06/17 0941 .  sodium chloride flush (NS) 0.9 % injection 3 mL, 3 mL, Intravenous, Q12H, Burgess Estelle, MD, 3 mL at 05/06/17 0942  Patients Current Diet: DIET DYS 3 Room service appropriate? Yes; Fluid consistency: Thin  Precautions / Restrictions Precautions Precautions: Fall Precaution Comments: R gaze preference Restrictions Weight Bearing Restrictions: No RLE Weight Bearing: Weight bearing as tolerated Other Position/Activity Restrictions: Must be in boot for any weight bearing   Has the patient had 2 or more falls or a fall with injury in the past year?No  Prior Activity Level Community (5-7x/wk): Prior to admission patient was recovering from a right broken foot; however, prior to that in August she was fully independent and working full time in a clerical position for [employer] Devices/Equipment: Cane (specify quad or straight)  Prior Device Use: Indicate devices/aids used by the patient prior to current illness, exacerbation  or injury? None of the  above  Prior Functional Level Prior Function Level of Independence:  (due to recent broken foot; Independent prior to that) Comments: ADLs and IADLs. Broke R foot a few weeks ago; must be in boot to stand/walk  Self Care: Did the patient need help bathing, dressing, using the toilet or eating? Independent  Indoor Mobility: Did the patient need assistance with walking from room to room (with or without device)? Independent  Stairs: Did the patient need assistance with internal or external stairs (with or without device)? Independent  Functional Cognition: Did the patient need help planning regular tasks such as shopping or remembering to take medications? Independent  Current Functional Level Cognition  Arousal/Alertness: Awake/alert Overall Cognitive Status: Impaired/Different from baseline Current Attention Level: Selective (L inattention) Orientation Level: Oriented X4 Following Commands: Follows one step commands with increased time, Follows multi-step commands with increased time, Follows multi-step commands inconsistently General Comments: Pt able to recall 3/3 words for ST memory testing. Pt abel to name five animals with minimal increased time. Pt requiring Max cues to perform simple math problem and demosntrating difficulty with multi-step tasks Attention: Sustained Sustained Attention: Impaired Sustained Attention Impairment: Functional basic Memory: Appears intact (simple tasks, recall of 4 words) Awareness: Impaired Awareness Impairment: Emergent impairment, Anticipatory impairment Problem Solving: Impaired Problem Solving Impairment: Functional complex    Extremity Assessment (includes Sensation/Coordination)  Upper Extremity Assessment: LUE deficits/detail LUE Deficits / Details: Brunnstrom stage 1 for hand and arm. No movement. Used as dependent assist during fucntional task and required Max A  LUE Sensation: decreased light touch LUE Coordination: decreased  fine motor, decreased gross motor  Lower Extremity Assessment: Defer to PT evaluation LLE Deficits / Details: Hip flexion 3-/5, quad 2+/5, ankle dorsiflexion 1/5; Stood today briefly with no LLE buckling noted    ADLs  Overall ADL's : Needs assistance/impaired Eating/Feeding: Moderate assistance, Sitting Eating/Feeding Details (indicate cue type and reason): Pt fixed her coffee while sitting in reclienr with support. Pt requiring Max hand over hand assist with use L hand for opening surgar packets and performing bialteral coordination tasks.  Grooming: Moderate assistance, Sitting Grooming Details (indicate cue type and reason): supported sitting Upper Body Bathing: Moderate assistance, Sitting Lower Body Bathing: Maximal assistance, +2 for physical assistance, Sit to/from stand Upper Body Dressing : Moderate assistance, Sitting Lower Body Dressing: Maximal assistance, +2 for physical assistance, Sit to/from stand Functional mobility during ADLs: Maximal assistance (sit<>stand only) General ADL Comments: Pt demosntrating decreased functional performance. Pt requiring Max hand over hand to use LUE during ADLs. Pt requiring Max A for sit<>stand. Educated family and pt on sensory deficits and to be cautious with hot/cold; pt wanting to drink her coffee.     Mobility  Overal bed mobility: Needs Assistance Bed Mobility: Sidelying to Sit, Rolling, Sit to Supine Rolling: Min guard, +2 for safety/equipment (Roll to L) Sidelying to sit: Mod assist, +2 for safety/equipment Supine to sit: Mod assist Sit to supine: Max assist, +2 for safety/equipment General bed mobility comments: Max A to manage BLE during sit>supine    Transfers  Overall transfer level: Needs assistance Equipment used: 2 person hand held assist Transfers: Sit to/from Stand, Stand Pivot Transfers Sit to Stand: Mod assist, +2 physical assistance Stand pivot transfers: Max assist, +2 physical assistance General transfer  comment: Pt required increased time and effort to power-up to full stand. L knee blocked initially but was able to maintain standing without knee block as well.     Ambulation /  Gait / Stairs / Emergency planning/management officer  Ambulation/Gait Ambulation/Gait assistance: Max assist, +2 physical assistance Ambulation Distance (Feet): 5 Feet (x2) Assistive device: 2 person hand held assist Gait Pattern/deviations: Step-to pattern, Decreased stride length, Trunk flexed General Gait Details: VC's for sequencing and general safety. Pt was able to initially perform pre-gait activity while standing at the chair. Max assist provided for weight shift to each side. Pt with grossly flexed trunk but able to make corrective changes with cues for upright posture. Pt took some pivotal steps around from chair to [facility], and then again back to chair at end of session.  Gait velocity: Decreased    Posture / Balance Balance Overall balance assessment: Needs assistance Sitting-balance support: Feet supported, Single extremity supported Sitting balance-Leahy Scale: Poor Standing balance support: During functional activity Standing balance-Leahy Scale: Poor    Special needs/care consideration BiPAP/CPAP: No CPM: No Continuous Drip IV: No Dialysis: No         Life Vest: No Oxygen: No Special Bed: Low bed candidate  Trach Size: No Wound Vac (area): No Skin: Bruising right and left arm and thigh                               Bowel mgmt: Continent, last BM 05/02/17 Bladder mgmt: Incontinent, catheter placed 05/04/17 Diabetic mgmt: HgbA1c- 5.4     Previous Home Environment Living Arrangements: Alone Available Help at Discharge: Family, Available 24 hours/day Home Care Services: No Additional Comments: Daughters in room plan to arrange for pt to stay with one of them  Discharge Living Setting Plans for Discharge Living Setting: Lives with (comment) (Will be discharging to daughter Lari's house in [place]) Type of  Home at Discharge: House Discharge Home Layout: Two level, Able to live on main level with bedroom/bathroom Alternate Level Stairs-Number of Steps: 1 flight  Discharge Home Access: Stairs to enter (can ramp if needed with notice ) Entrance Stairs-Rails: Right, Left (can only reach one at a time) Entrance Stairs-Number of Steps: 6  Discharge Bathroom Shower/Tub: Walk-in shower, Other (comment) (with set and a moble shower head) Discharge Bathroom Toilet: Standard Discharge Bathroom Accessibility: Yes How Accessible: Accessible via walker (TBD if wheelchair accessible if needed) Does the patient have any problems obtaining your medications?: No  Social/Family/Support Systems Patient Roles: Parent, Other (Comment) (Grandparent ) Contact Information: Daughter: Kingsley Spittle [phone]; Daughter: Marlou Porch [phone] Anticipated Caregiver: [name] Anticipated Caregiver's Contact Information: see above Ability/Limitations of Caregiver: None, but she lives in [place] and patient will discharge to her home there Caregiver Availability: 24/7 Discharge Plan Discussed with Primary Caregiver: Yes Is Caregiver In Agreement with Plan?: Yes Does Caregiver/Family have Issues with Lodging/Transportation while Pt is in Rehab?: No  Goals/Additional Needs Patient/Family Goal for Rehab: PT; Min-Mod A; OT: Min A; SLP: Mod I Expected length of stay: 20-24 days  Cultural Considerations: None Dietary Needs: Dys.3 textures and thin liquids with heart healthy restrictions  Equipment Needs: TBD Special Service Needs: None Additional Information: None Pt/Family Agrees to Admission and willing to participate: Yes Program Orientation Provided & Reviewed with Pt/Caregiver Including Roles  & Responsibilities: Yes  Barriers to Discharge: Inaccessible home environment (family will need notice if a ramp will be needed )  Decrease burden of Care through IP rehab admission: No  Possible need for SNF placement  upon discharge: No  Patient Condition: This patient's condition remains as documented in the consult dated 05/05/17, in which the Rehabilitation Physician determined  and documented that the patient's condition is appropriate for intensive rehabilitative care in an inpatient rehabilitation facility. Will admit to inpatient rehab today.  Preadmission Screen Completed By:  Gunnar Fusi, 05/06/2017 3:00 PM ______________________________________________________________________   Discussed status with Dr. Naaman Plummer on 05/06/17 at 1525 and received telephone approval for admission today.  Admission Coordinator:  Gunnar Fusi, time 1525 Sudie Grumbling 05/06/17       Cosigned by: Meredith Staggers, MD at 05/06/2017 3:26 PM  Revision History

## 2017-05-07 NOTE — Progress Notes (Signed)
[place] PHYSICAL MEDICINE & REHABILITATION     PROGRESS NOTE  Subjective/Complaints:  Pt seen sitting up in bed this Am, daughter at bedside.  She slept well overnight, but did have some back pain, which is chronic.  K-pad was not available.  She also complains of constipation.  Daughter notes chronic callus to b/l feet.  ROS: +Constipation. Denies CP, SOB, N/V/D.  Objective: Vital Signs: Blood pressure (!) 141/70, pulse 60, temperature 98.3 F (36.8 C), temperature source Oral, resp. rate 18, height 5\' 7"  (1.702 m), weight 81.2 kg (179 lb), SpO2 94 %. No results found. No results for input(s): WBC, HGB, HCT, PLT in the last 72 hours.  Recent Labs  05/05/17 0623 05/06/17 0637  NA 138 136  K 3.9 3.5  CL 110 107  GLUCOSE 81 96  BUN 11 13  CREATININE 0.79 0.79  CALCIUM 9.3 9.2   CBG (last 3)   Recent Labs  05/05/17 0835 05/06/17 0835  GLUCAP 104* 86    Wt Readings from Last 3 Encounters:  05/06/17 81.2 kg (179 lb)  05/03/17 87.8 kg (193 lb 8 oz)    Physical Exam:  BP (!) 141/70 (BP Location: Right Arm)   Pulse 60   Temp 98.3 F (36.8 C) (Oral)   Resp 18   Ht 5\' 7"  (1.702 m)   Wt 81.2 kg (179 lb)   SpO2 94%   BMI 28.04 kg/m  Constitutional: She appears well-developedand well-nourished. NAD. HENT: Normocephalicand atraumatic.  Eyes: EOMI. No discharge.  Cardiovascular: Normal rateand regular rhythm. No JVD. Respiratory: No respiratory distress. She has no wheezes. GI: She exhibits no distension. BS+ Musculoskeletal: She exhibits no edemaor deformity.  Neurological:  Right gaze preference.  Motor: LUE: 0/5 proximal to distal LLE: 1+/5 HF, and 0/5 distally.  No increase in tone Sensation intact to light touch Mild dysarthria  Follows simple commands.  Fair insight and awareness. Skin: Skin is warm.  Psychiatric: Flat.  Assessment/Plan: 1. Functional deficits secondary to right basal ganglia infarct with recent right foot fracture which require  3+ hours per day of interdisciplinary therapy in a comprehensive inpatient rehab setting. Physiatrist is providing close team supervision and 24 hour management of active medical problems listed below. Physiatrist and rehab team continue to assess barriers to discharge/monitor patient progress toward functional and medical goals.  Function:  Bathing Bathing position      Bathing parts      Bathing assist        Upper Body Dressing/Undressing Upper body dressing                    Upper body assist        Lower Body Dressing/Undressing Lower body dressing                                  Lower body assist        Toileting Toileting          Toileting assist     Transfers Chair/bed transfer             Locomotion Ambulation           Wheelchair          Cognition Comprehension Comprehension assist level: Follows basic conversation/direction with no assist  Expression Expression assist level: Expresses basic needs/ideas: With no assist  Social Interaction Social Interaction assist level: Interacts appropriately with others with medication  or extra time (anti-anxiety, antidepressant).  Problem Solving Problem solving assist level: Solves basic 90% of the time/requires cueing < 10% of the time  Memory Memory assist level: More than reasonable amount of time    Medical Problem List and Plan: 1. Left hemiparesis and functional deficitssecondary to right basal ganglia infarct with recent right foot fracture  Begin CIR 2. DVT Prophylaxis/Anticoagulation: Pharmaceutical: Lovenox 3. Pain Management/Chronic low back pain: Ice /heat for back with ultram prn.  4. Mood: LCSW to follow for evaluation and support.  5. Neuropsych: This patient iscapable of making decisions on herown behalf. 6. Skin/Wound Care: routine pressure relief measures. Elevate RLE when seated.  7. Fluids/Electrolytes/Nutrition: Monitor I/O.   Labs pending 8.  Dyslipidemia: Continue lipitor.  9. HTN: Will resume HCTZ if indicated and will likely need Kdur for supplement.   Monitor with increased mobility 10. ABLA:   Hb 10.7 on 9/30  Cont to monitor 11. Constipation  Bowel reg increased on 10/3 12. 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  Podiatrist to perform on Friday per daughter   LOS (Days) 1 A FACE TO FACE EVALUATION WAS PERFORMED  Jaxden Blyden Lorie Phenix 05/07/2017 8:04 AM

## 2017-05-07 NOTE — Evaluation (Signed)
Physical Therapy Assessment and Plan  Patient Details  Name: Kelly Lowe MRN: [id number] Date of Birth: Nov 27, 1950  PT Diagnosis: Abnormal posture, Difficulty walking, Hemiparesis non-dominant, Hypertonia, Low back pain and Muscle weakness Rehab Potential: Good ELOS: 3 weeks   Today's Date: 05/07/2017 PT Individual Time: 0900-1000, 9794-8016 PT Individual Time Calculation (min): 60 min , 30 min   Problem List:  Patient Active Problem List   Diagnosis Date Noted  . Benign essential HTN   . Slow transit constipation   . Embolic stroke of right basal ganglia ([place]) 05/06/2017  . Essential hypertension 05/05/2017  . Tobacco use 05/05/2017  . Cerebral infarction ([place])   . Pain   . Hypokalemia   . Acute blood loss anemia   . CVA (cerebral vascular accident) ([place]) 05/03/2017    Past Medical History:  Past Medical History:  Diagnosis Date  . Hypertension    Past Surgical History:  Past Surgical History:  Procedure Laterality Date  . FOOT SURGERY      Assessment & Plan Clinical Impression: Patient is a 66 y.o. year old female with history of HTN, right foot fractures 03/2017; who was admitted on 05/03/17 with left sided weakness, right gaze preference and left facial droop. History taken from chart review, patient, and sister. CT head reviewed, showing right basal gangial infarct. Per report, age indeterminate right caudate infarct and CTA head/neck revealed intracranial atherosclerosis involving R>L PCA.MRI brain done revealing Non-hemorrhagic infarct in right basal ganglia, punctate infarct in left splenium and moderate underlying small vessel disease.2 D echo done revealing EF 65-70% with no wall abnormality, mild MVR, TVR and PVR. Hip films done due to reports of fall and back pain and showed DDD and no hip fracture or dislocation. 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 Dr. Leonie Man recommends ASA for embolic stroke due to unknown source and will need TEE/loop. TCD without hits or evidence of PFO. Patient with resultant deficits in mobility and self care tasks due to dense left hemiparesis with sensory deficits, right gaze preference, decrease awareness of deficits as well as delayed processing due to higher level deficits. CIR recommended for follow up therapy.  Patient transferred to CIR on 05/06/2017 .   Patient currently requires max with mobility secondary to muscle weakness, impaired timing and sequencing and decreased motor planning, decreased attention to left and decreased standing balance, decreased postural control, hemiplegia and decreased balance strategies.  Prior to hospitalization, patient was independent  with mobility and lived with Daughter, Alone (was living alone prior to this, will d/c and live with daughter) in a House home.  Home access is 6 STEStairs to enter (stairs to enter in garage as well, more narrow).  Patient will benefit from skilled PT intervention to maximize safe functional mobility, minimize fall risk and decrease caregiver burden for planned discharge home with 24 hour assist.  Anticipate patient will benefit from follow up [facility] at discharge.  PT - End of Session Activity Tolerance: Decreased this session;Tolerates 10 - 20 min activity with multiple rests Endurance Deficit: Yes PT Assessment Rehab Potential (ACUTE/IP ONLY): Good PT Barriers to Discharge: Home environment access/layout PT Barriers to Discharge Comments: 6 STE PT Patient demonstrates impairments in the following area(s): Balance;Behavior;Edema;Endurance;Motor;Nutrition;Pain;Safety;Sensory PT Transfers Functional Problem(s): Bed Mobility;Bed to Chair;Car;Furniture;Floor PT Locomotion Functional Problem(s): Ambulation;Wheelchair Mobility;Stairs PT Plan PT Intensity: Minimum of 1-2 x/day ,45 to 90 minutes PT Frequency: 5 out of  7 days PT Duration  Estimated Length of Stay: 3 weeks PT Treatment/Interventions: Ambulation/gait training;Discharge planning;Functional mobility training;Therapeutic Activities;Psychosocial support;Visual/perceptual remediation/compensation;Balance/vestibular training;Neuromuscular re-education;Therapeutic Exercise;Wheelchair propulsion/positioning;Cognitive remediation/compensation;DME/adaptive equipment instruction;Pain management;Splinting/orthotics;UE/LE Strength taining/ROM;Community reintegration;Functional electrical stimulation;Patient/family education;Stair training;UE/LE Coordination activities PT Transfers Anticipated Outcome(s): min assist PT Locomotion Anticipated Outcome(s): mod assist PT Recommendation Recommendations for Other Services: Therapeutic Recreation consult Therapeutic Recreation Interventions: Outing/community reintergration Follow Up Recommendations: Home health PT Patient destination: Home Equipment Recommended: To be determined  Skilled Therapeutic Intervention Session 1: Evaluation completed (see details above and below) with education on PT POC and goals and individual treatment initiated with focus on functional mobility and L neuromuscular re-ed. Pt supine in bed upon PT arrival, agreeable to therapy tx and denies pain. Pt's two daughters present at start of session (pt plans to d/c with eldest daughter). Pt transferred from supine to sitting EOB with mod assist to help lift LEs. Pt sitting EOB with good static sitting balance, stand by assist while therapist set up w/c for transfer. Pt requiring max assist for squat pivot transfer. Pt performed sit<>stand with max assist, verbal and tactile cues for L LE knee and hip extension. Pt able to performed sit<>stand using a handrail with mod assist, verbal cues for upright posture, symmetric weightbearing and L LE extension. Pt performed car transfer from w/c<>car squat pivot with max assist. Pt propelled w/c x 50 ft with mod assist for  steering as pt has difficulty using R LE to steer secondary to walking boot. Pt became very tearful at the end of her session, felt like she did not perform well, PT provided emotional support and reassured pt. Pt left seated in w/c with needs in reach.   Session 2: Pt seated in w/c upon PT arrival, agreeable to therapy. Pt transported to gym total assist. Pt performed pre gait using R handrail for UE assist, stepping with each LE to and from colored dot on floor, verbal cues for sequencing, facilitation for L LE advancement and tactile cues for L LE extension. Pt ambulated x 6 ft with w/c follow and max assist, R handrail for UE support, facilitation for L LE advancement and facilitation for L knee/hip extension during stance. Pt ascended/desecnded single 3 inch step with max assist and using R handrail, verbal and tactile cues for sequencing. Pt left in w/c with family present at end of session. Discussed session and evaluation with family and patient, discussed expected goals. Family with no further questions.   PT Evaluation Precautions/Restrictions Precautions Precautions: Fall Restrictions Weight Bearing Restrictions: No RLE Weight Bearing: Weight bearing as tolerated Other Position/Activity Restrictions: Must be in boot for any weight bearing General   Vital SignsTherapy Vitals Temp: 98.3 F (36.8 C) Temp Source: Oral Pulse Rate: 60 Resp: 18 BP: (!) 141/70 Patient Position (if appropriate): Lying Oxygen Therapy SpO2: 94 % O2 Device: Not Delivered Pain Pain Assessment Pain Assessment: No/denies pain Home Living/Prior Functioning Home Living Living Arrangements: Alone Available Help at Discharge: Family;Available 24 hours/day Type of Home: House Home Access: Stairs to enter (stairs to enter in garage as well, more narrow) Entrance Stairs-Number of Steps: 6 STE Entrance Stairs-Rails: Right;Left Home Layout: Two level (two level home but pt able to live on the first floor with  bed/bath) Additional Comments: Daughters present, pt will go home with eldest daughter [name]  Lives With: Daughter;Alone (was living alone prior to this, will d/c and live with daughter) Prior Function Level of Independence: Independent with basic ADLs;Independent with gait  Able to Take Stairs?:  Yes Driving: No (not driving because of her broken foot) Vocation: Full time employment Comments: 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, enjoys shopping and cooking Cognition Overall Cognitive Status: Impaired/Different from baseline Arousal/Alertness: Awake/alert Orientation Level: Oriented X4 Attention: Sustained Sustained Attention: Impaired Sustained Attention Impairment: Functional basic Memory: Appears intact Awareness: Impaired Problem Solving: Impaired Sensation Sensation Light Touch: Appears Intact (B LEs by gross assessment) Stereognosis: Not tested Proprioception: Appears Intact (B LEs by gross assessment) Coordination Gross Motor Movements are Fluid and Coordinated: No Fine Motor Movements are Fluid and Coordinated: No Coordination and Movement Description: impaired secondary to L LE hemiparesis Heel Shin Test: WFL R LE, impaired L LE secondary to weakness Motor  Motor Motor: Hemiplegia Motor - Skilled Clinical Observations: L hemiplegia   Trunk/Postural Assessment  Cervical Assessment Cervical Assessment: Exceptions to [facility] (forward head posture) Thoracic Assessment Thoracic Assessment: Exceptions to [facility] (kyphotic) Lumbar Assessment Lumbar Assessment: Within Functional Limits Postural Control Postural Control: Deficits on evaluation  Balance Balance Balance Assessed: Yes Static Sitting Balance Static Sitting - Level of Assistance: 5: Stand by assistance Dynamic Sitting Balance Dynamic Sitting - Level of Assistance: 4: Min assist Static Standing Balance Static Standing - Level of Assistance: 3: Mod assist Dynamic Standing Balance Dynamic Standing - Level of Assistance:  2: Max assist Extremity Assessment   RLE Assessment RLE Assessment: Exceptions to [facility] except R ankle weakness grossly 3+/5 secondary to recent foot fracture) LLE Assessment LLE Assessment: Exceptions to WFL (ROM WFL except DF to neutral and hamstring tighness) LLE Strength Left Hip Flexion: 3-/5 Left Hip Extension: 3-/5 Left Knee Flexion: 2-/5 Left Knee Extension: 3-/5 Left Ankle Dorsiflexion: 0/5 Left Ankle Plantar Flexion: 1/5   See Function Navigator for Current Functional Status.   Refer to Care Plan for Long Term Goals  Recommendations for other services: None   Discharge Criteria: Patient will be discharged from PT if patient refuses treatment 3 consecutive times without medical reason, if treatment goals not met, if there is a change in medical status, if patient makes no progress towards goals or if patient is discharged from hospital.  The above assessment, treatment plan, treatment alternatives and goals were discussed and mutually agreed upon: by patient and by family  Netta Corrigan, PT, DPT 05/07/2017, 9:26 AM

## 2017-05-07 NOTE — Progress Notes (Signed)
Kelly Arn, MD Physician Signed Physical Medicine and Rehabilitation  Consult Note Date of Service: 05/05/2017 8:21 AM  Related encounter: ED to Hosp-Admission (Discharged) from 05/03/2017 in [place] All Collapse All   [] Hide copied text [] Hover for attribution information      Physical Medicine and Rehabilitation Consult   Reason for Consult: Stroke with cognitive deficits as well as deficits in mobility and self care tasks.  Referring Physician: Dr. Lynnae January.    HPI: Kelly Lowe is a 66 y.o. female with history of HTN, right foot fractures 03/2017;  who was admitted on 05/03/17 with left sided weakness, right gaze preference and left facial droop. History taken from chart review, patient, and sister. CT head reviewed, showing right basal gangial infarct. Per report, age indeterminate right caudate infarct and CTA head/neck revealed intracranial atherosclerosis involving R> L PCA. MRI brain done revealing  Non-hemorrhagic infarct in right basal ganglia, punctate infarct in left splenium and moderate underlying small vessel disease.  Hip films done due to reports of fall and back pain and showed DDD and no hip fracture or dislocation. Work up ongoing and therapy done yesterday revealing deficits in mobility and self care tasks due to dense left hemiparesis with sensory deficits, right gaze preference, decrease awareness of deficits with deficits in problems solving. CIR recommended for follow up therapy.    Review of Systems  HENT: Negative for hearing loss and tinnitus.   Eyes: Negative for blurred vision and double vision.  Respiratory: Negative for cough and shortness of breath.   Cardiovascular: Negative for chest pain and palpitations.  Gastrointestinal: Negative for heartburn and nausea.  Genitourinary: Negative for dysuria.  Musculoskeletal: Negative for back pain, joint pain and myalgias.  Neurological: Positive for  sensory change, speech change, focal weakness and weakness. Negative for dizziness and headaches.  Psychiatric/Behavioral: The patient has insomnia.   All other systems reviewed and are negative.         Past Medical History:  Diagnosis Date  . Hypertension          Past Surgical History:  Procedure Laterality Date  . FOOT SURGERY      No pertinent family history of premature stroke.    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 She  reports that she has been smoking Cigarettes-- 1/2 PPD.   She has a 12.50 pack-year smoking history. She has never used smokeless tobacco. She reports that she drinks about a glass of wine on special occasions/holidays.  She reports that she does not use drugs.    Allergies: No Known Allergies          Medications Prior to Admission  Medication Sig Dispense Refill  . amLODipine (NORVASC) 5 MG tablet Take 5 mg by mouth daily.  1  . BIOTIN PO Take 1 tablet by mouth daily.    . Cyanocobalamin (VITAMIN B 12 PO) Take 1 tablet by mouth daily.    . hydrochlorothiazide (HYDRODIURIL) 12.5 MG tablet Take 12.5 mg by mouth every morning.  1  . ibuprofen (ADVIL,MOTRIN) 200 MG tablet Take 200 mg by mouth every 6 (six) hours as needed for mild pain.    . Multiple Vitamin (MULTIVITAMIN WITH MINERALS) TABS tablet Take 1 tablet by mouth daily.      Home: Home Living Family/patient expects to be discharged to:: Inpatient rehab Living Arrangements: Alone Additional Comments: Daughters in room plan to arrange for pt to  stay with one of them  Functional History: Prior Function Level of Independence: Independent with assistive device(s) Comments: ADLs and IADLs. Broke R foot a few weeks ago; must be in boot to stand/walk Functional Status:  Mobility: Bed Mobility Overal bed mobility: Needs Assistance Bed Mobility: Supine to Sit Supine to sit: Mod assist General bed mobility comments: In recliner upon  arrival Transfers Overall transfer level: Needs assistance Equipment used: 1 person hand held assist (support given at L elbow and R gait belt) Transfers: Sit to/from Stand, Stand Pivot Transfers Sit to Stand: Max assist Stand pivot transfers: Max assist General transfer comment: Max assist to power up from recliner. Pt with L lateral lean during standing  ADL: ADL Overall ADL's : Needs assistance/impaired Eating/Feeding: Moderate assistance, Sitting Eating/Feeding Details (indicate cue type and reason): Pt fixed her coffee while sitting in reclienr with support. Pt requiring Max hand over hand assist with use L hand for opening surgar packets and performing bialteral coordination tasks.  Grooming: Moderate assistance, Sitting Grooming Details (indicate cue type and reason): supported sitting Upper Body Bathing: Moderate assistance, Sitting Lower Body Bathing: Maximal assistance, +2 for physical assistance, Sit to/from stand Upper Body Dressing : Moderate assistance, Sitting Lower Body Dressing: Maximal assistance, +2 for physical assistance, Sit to/from stand Functional mobility during ADLs: Maximal assistance (sit<>stand only) General ADL Comments: Pt demosntrating decreased functional performance. Pt requiring Max hand over hand to use LUE during ADLs. Pt requiring Max A for sit<>stand. Educated family and pt on sensory deficits and to be cautious with hot/cold; pt wanting to drink her coffee.   Cognition: Cognition Overall Cognitive Status: Impaired/Different from baseline Orientation Level: Oriented X4 Cognition Arousal/Alertness: Awake/alert Behavior During Therapy: WFL for tasks assessed/performed Overall Cognitive Status: Impaired/Different from baseline Area of Impairment: Attention, Problem solving, Following commands Current Attention Level: Selective (L inattention) Following Commands: Follows one step commands with increased time, Follows multi-step commands with  increased time, Follows multi-step commands inconsistently Problem Solving: Slow processing, Difficulty sequencing, Requires verbal cues, Requires tactile cues General Comments: Pt able to recall 3/3 words for ST memory testing. Pt abel to name five animals with minimal increased time. Pt requiring Max cues to perform simple math problem and demosntrating difficulty with multi-step tasks  Blood pressure (!) 147/63, pulse (!) 55, temperature 98.4 F (36.9 C), temperature source Oral, resp. rate 17, height 5\' 7"  (1.702 m), weight 87.8 kg (193 lb 8 oz), SpO2 98 %. Physical Exam  Nursing note and vitals reviewed. Constitutional: She is oriented to person, place, and time. She appears well-developed and well-nourished.  Up in chair slumped to the right with right gaze preference.   HENT:  Head: Normocephalic and atraumatic.  Eyes: Pupils are equal, round, and reactive to light. Conjunctivae are normal.  Needs max cues to turn eyes to the left.  Neck: Normal range of motion. Neck supple.  Cardiovascular: Normal rate and regular rhythm.   Respiratory: Effort normal and breath sounds normal. No stridor. She has no wheezes.  GI: Soft. Bowel sounds are normal. She exhibits no distension. There is no tenderness.  Musculoskeletal: She exhibits no edema or tenderness.  CAM boot on right foot.   Neurological: She is alert and oriented to person, place, and time.  Left facial weakness with tongue deviation to the left.  Mild dysarthria.  Right gaze preference and needed max cues to move eyes to left field.  Motor: RUE/RLE: 5/5 proximal to distal (except for ankle in boot) LUE: 0/5 proximal to distal  LLE: HF 1/5, distally 0/5 Sensation diminished to light touch LUE/LLE  Skin: Skin is warm and dry.  Psychiatric: Her affect is blunt. Her speech is delayed. She is slowed.    Lab Results Last 24 Hours  No results found for this or any previous visit (from the past 24 hour(s)).    Imaging Results  (Last 48 hours)  Ct Angio Head W Or Wo Contrast  Result Date: 05/03/2017 CLINICAL DATA:  Left facial droop.  Right gaze. EXAM: CT ANGIOGRAPHY HEAD AND NECK CT PERFUSION BRAIN TECHNIQUE: Multidetector CT imaging of the head and neck was performed using the standard protocol during bolus administration of intravenous contrast. Multiplanar CT image reconstructions and MIPs were obtained to evaluate the vascular anatomy. Carotid stenosis measurements (when applicable) are obtained utilizing NASCET criteria, using the distal internal carotid diameter as the denominator. Multiphase CT imaging of the brain was performed following IV bolus contrast injection. Subsequent parametric perfusion maps were calculated using RAPID software. CONTRAST:  90 mL Isovue 370 COMPARISON:  Noncontrast head CT earlier today FINDINGS: CTA NECK FINDINGS Aortic arch: Standard 3 vessel aortic arch with mild atherosclerotic plaque. Widely patent brachiocephalic and subclavian arteries. Right carotid system: Patent without evidence of stenosis or dissection. Left carotid system: Patent without evidence of stenosis or dissection. Vertebral arteries: Patent without evidence of stenosis or dissection. The right vertebral artery is slightly larger than the left. Skeleton: No fracture or suspicious osseous lesion. Other neck: Mild thyroid heterogeneity without sizable discrete nodule. Upper chest: Minimal atelectasis in the visualized lungs. Review of the MIP images confirms the above findings CTA HEAD FINDINGS Anterior circulation: The internal carotid arteries are patent from skullbase to carotid termini without stenosis. There is minimal right cavernous segment atherosclerosis. ACAs and MCAs are patent without evidence of proximal branch occlusion or significant proximal stenosis. The left A1 segment is moderately dominant. No aneurysm. Posterior circulation: The intracranial vertebral arteries are patent to the basilar with the right being  mildly dominant. There is mild V4 segment atherosclerosis and irregularity without significant stenosis. Patent bilateral PICA, left AICA, and bilateral SCA origins are identified. The basilar artery is widely patent. Posterior communicating arteries are diminutive or absent. PCAs are patent with moderate right and mild left P3 and more distal branch vessel irregularity and narrowing but no significant proximal stenosis. No aneurysm. Venous sinuses: As permitted by contrast timing, patent. Anatomic variants: None. Delayed phase: Not performed. Review of the MIP images confirms the above findings CT Brain Perfusion Findings: CBF (<30%) Volume:  0 mL Perfusion (Tmax>6.0s) volume:  0 mL Mismatch Volume: n/a Infarction Location: n/a IMPRESSION: 1. Negative cerebral perfusion imaging. 2. No emergent large vessel occlusion. 3. Intracranial atherosclerosis with most notable involvement of right greater than left PCA branch vessels. No flow limiting proximal stenosis. 4. Widely patent cervical carotid and vertebral arteries. Preliminary findings of negative perfusion imaging and no emergent large vessel occlusion were communicated in person to Dr. Leonel Ramsay on 05/03/2017 at 9:20 a.m. Electronically Signed   By: Logan Bores M.D.   On: 05/03/2017 09:41   Dg Lumbar Spine Complete  Result Date: 05/03/2017 CLINICAL DATA:  Low back pain since falling this morning. EXAM: LUMBAR SPINE - COMPLETE 4+ VIEW COMPARISON:  None. FINDINGS: Five lumbar type vertebral bodies. There is a minimal convex right scoliosis. The lateral alignment is normal. The disc spaces are preserved. Facet degenerative changes are present inferiorly. There is no evidence of acute fracture or pars defect. There is contrast material in the  bladder from preceding CT scan. Aortic atherosclerosis noted. IMPRESSION: No evidence of acute lumbar spine injury.  Facet hypertrophy noted. Electronically Signed   By: Richardean Sale M.D.   On: 05/03/2017 16:43    Ct Angio Neck W Or Wo Contrast  Result Date: 05/03/2017 CLINICAL DATA:  Left facial droop.  Right gaze. EXAM: CT ANGIOGRAPHY HEAD AND NECK CT PERFUSION BRAIN TECHNIQUE: Multidetector CT imaging of the head and neck was performed using the standard protocol during bolus administration of intravenous contrast. Multiplanar CT image reconstructions and MIPs were obtained to evaluate the vascular anatomy. Carotid stenosis measurements (when applicable) are obtained utilizing NASCET criteria, using the distal internal carotid diameter as the denominator. Multiphase CT imaging of the brain was performed following IV bolus contrast injection. Subsequent parametric perfusion maps were calculated using RAPID software. CONTRAST:  90 mL Isovue 370 COMPARISON:  Noncontrast head CT earlier today FINDINGS: CTA NECK FINDINGS Aortic arch: Standard 3 vessel aortic arch with mild atherosclerotic plaque. Widely patent brachiocephalic and subclavian arteries. Right carotid system: Patent without evidence of stenosis or dissection. Left carotid system: Patent without evidence of stenosis or dissection. Vertebral arteries: Patent without evidence of stenosis or dissection. The right vertebral artery is slightly larger than the left. Skeleton: No fracture or suspicious osseous lesion. Other neck: Mild thyroid heterogeneity without sizable discrete nodule. Upper chest: Minimal atelectasis in the visualized lungs. Review of the MIP images confirms the above findings CTA HEAD FINDINGS Anterior circulation: The internal carotid arteries are patent from skullbase to carotid termini without stenosis. There is minimal right cavernous segment atherosclerosis. ACAs and MCAs are patent without evidence of proximal branch occlusion or significant proximal stenosis. The left A1 segment is moderately dominant. No aneurysm. Posterior circulation: The intracranial vertebral arteries are patent to the basilar with the right being mildly dominant.  There is mild V4 segment atherosclerosis and irregularity without significant stenosis. Patent bilateral PICA, left AICA, and bilateral SCA origins are identified. The basilar artery is widely patent. Posterior communicating arteries are diminutive or absent. PCAs are patent with moderate right and mild left P3 and more distal branch vessel irregularity and narrowing but no significant proximal stenosis. No aneurysm. Venous sinuses: As permitted by contrast timing, patent. Anatomic variants: None. Delayed phase: Not performed. Review of the MIP images confirms the above findings CT Brain Perfusion Findings: CBF (<30%) Volume:  0 mL Perfusion (Tmax>6.0s) volume:  0 mL Mismatch Volume: n/a Infarction Location: n/a IMPRESSION: 1. Negative cerebral perfusion imaging. 2. No emergent large vessel occlusion. 3. Intracranial atherosclerosis with most notable involvement of right greater than left PCA branch vessels. No flow limiting proximal stenosis. 4. Widely patent cervical carotid and vertebral arteries. Preliminary findings of negative perfusion imaging and no emergent large vessel occlusion were communicated in person to Dr. Leonel Ramsay on 05/03/2017 at 9:20 a.m. Electronically Signed   By: Logan Bores M.D.   On: 05/03/2017 09:41   Mr Brain Wo Contrast  Result Date: 05/03/2017 CLINICAL DATA:  Initial evaluation for acute left-sided weakness. EXAM: MRI HEAD WITHOUT CONTRAST TECHNIQUE: Multiplanar, multiecho pulse sequences of the brain and surrounding structures were obtained without intravenous contrast. COMPARISON:  Prior CT from earlier same day. FINDINGS: Brain: Acute right basal ganglia infarct measuring 4.4 x 2.1 x 3.2 cm (series 3, image 29). No associated mass effect or hemorrhage. Additional possible punctate ischemic infarct within the left splenium noted as well (series 3, image 29). No other evidence for acute or subacute infarct. Generalized age-related cerebral atrophy. Patchy and  confluent  T2/FLAIR hyperintensity within the periventricular and deep white matter both cerebral hemispheres, moderate nature. Remote lacunar infarcts noted within the right basal ganglia and right thalamus. Susceptibility artifact within the right periatrial white matter consistent with small focus of chronic hemorrhage. No mass lesion, midline shift or mass effect. No hydrocephalus. No extra-axial fluid collection. Major dural sinuses grossly patent. Pituitary gland normal.  Midline structures intact and normal Vascular: Major intracranial vascular flow voids maintained. Skull and upper cervical spine: Craniocervical junction normal. Upper cervical spine within normal limits. Bone marrow signal intensity diffusely heterogeneous. No acute scalp soft tissue abnormality. Sinuses/Orbits: Globes normal soft tissues within normal limits. Mild scattered mucosal thickening within the ethmoidal air cells. Paranasal sinuses are otherwise clear. No mastoid effusion. Inner ear structures normal. Other: None. IMPRESSION: 1. 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 2. Additional punctate ischemic infarct within the left splenium. 3. Generalized age-related cerebral atrophy with underlying moderate chronic microvascular ischemic disease. Electronically Signed   By: Jeannine Boga M.D.   On: 05/03/2017 23:36   Ct Cerebral Perfusion W Contrast  Result Date: 05/03/2017 CLINICAL DATA:  Left facial droop.  Right gaze. EXAM: CT ANGIOGRAPHY HEAD AND NECK CT PERFUSION BRAIN TECHNIQUE: Multidetector CT imaging of the head and neck was performed using the standard protocol during bolus administration of intravenous contrast. Multiplanar CT image reconstructions and MIPs were obtained to evaluate the vascular anatomy. Carotid stenosis measurements (when applicable) are obtained utilizing NASCET criteria, using the distal internal carotid diameter as the denominator. Multiphase CT imaging of the brain  was performed following IV bolus contrast injection. Subsequent parametric perfusion maps were calculated using RAPID software. CONTRAST:  90 mL Isovue 370 COMPARISON:  Noncontrast head CT earlier today FINDINGS: CTA NECK FINDINGS Aortic arch: Standard 3 vessel aortic arch with mild atherosclerotic plaque. Widely patent brachiocephalic and subclavian arteries. Right carotid system: Patent without evidence of stenosis or dissection. Left carotid system: Patent without evidence of stenosis or dissection. Vertebral arteries: Patent without evidence of stenosis or dissection. The right vertebral artery is slightly larger than the left. Skeleton: No fracture or suspicious osseous lesion. Other neck: Mild thyroid heterogeneity without sizable discrete nodule. Upper chest: Minimal atelectasis in the visualized lungs. Review of the MIP images confirms the above findings CTA HEAD FINDINGS Anterior circulation: The internal carotid arteries are patent from skullbase to carotid termini without stenosis. There is minimal right cavernous segment atherosclerosis. ACAs and MCAs are patent without evidence of proximal branch occlusion or significant proximal stenosis. The left A1 segment is moderately dominant. No aneurysm. Posterior circulation: The intracranial vertebral arteries are patent to the basilar with the right being mildly dominant. There is mild V4 segment atherosclerosis and irregularity without significant stenosis. Patent bilateral PICA, left AICA, and bilateral SCA origins are identified. The basilar artery is widely patent. Posterior communicating arteries are diminutive or absent. PCAs are patent with moderate right and mild left P3 and more distal branch vessel irregularity and narrowing but no significant proximal stenosis. No aneurysm. Venous sinuses: As permitted by contrast timing, patent. Anatomic variants: None. Delayed phase: Not performed. Review of the MIP images confirms the above findings CT Brain  Perfusion Findings: CBF (<30%) Volume:  0 mL Perfusion (Tmax>6.0s) volume:  0 mL Mismatch Volume: n/a Infarction Location: n/a IMPRESSION: 1. Negative cerebral perfusion imaging. 2. No emergent large vessel occlusion. 3. Intracranial atherosclerosis with most notable involvement of right greater than left PCA branch vessels. No flow limiting proximal  stenosis. 4. Widely patent cervical carotid and vertebral arteries. Preliminary findings of negative perfusion imaging and no emergent large vessel occlusion were communicated in person to Dr. Leonel Ramsay on 05/03/2017 at 9:20 a.m. Electronically Signed   By: Logan Bores M.D.   On: 05/03/2017 09:41   Dg Hips Bilat With Pelvis 3-4 Views  Result Date: 05/03/2017 CLINICAL DATA:  Low back pain following a fall this morning. EXAM: DG HIP (WITH OR WITHOUT PELVIS) 3-4V BILAT COMPARISON:  None. FINDINGS: Excreted contrast in the urinary bladder. Normal appearing hips without fracture or dislocation. Lower lumbar spine degenerative changes. IMPRESSION: No hip fracture or dislocation. Electronically Signed   By: Claudie Revering M.D.   On: 05/03/2017 16:43   Ct Head Code Stroke Wo Contrast  Result Date: 05/03/2017 CLINICAL DATA:  Code stroke. Left-sided flaccid. Gaze to the left and facial droop. EXAM: CT HEAD WITHOUT CONTRAST TECHNIQUE: Contiguous axial images were obtained from the base of the skull through the vertex without intravenous contrast. COMPARISON:  None. FINDINGS: Brain: Age-indeterminate lenticulostriate pattern infarct in the right caudate head. No visible cortical infarct. No hemorrhage, hydrocephalus, or masslike finding. There is chronic small vessel ischemic type change in the deep cerebral white matter. Vascular: Atherosclerotic calcification.  No hyperdense vessel. Skull: No acute or aggressive finding. Sinuses/Orbits: No acute finding Other: Prelim text page sent 05/03/2017 at 8:42 am to Dr. Leonel Ramsay. ASPECTS [facility] Stroke Program Early CT  Score) - Ganglionic level infarction (caudate, lentiform nuclei, internal capsule, insula, M1-M3 cortex): 6 - Supraganglionic infarction (M4-M6 cortex): 3 Total score (0-10 with 10 being normal): 9 IMPRESSION: 1. Age indeterminate right caudate head infarction with a lenticulostriate pattern. ASPECTS is 9. 2. Negative for acute hemorrhage. 3. Chronic small vessel ischemia. Electronically Signed   By: Monte Fantasia M.D.   On: 05/03/2017 08:43     Assessment/Plan: Diagnosis: Right basal ganglia CVA Labs and images independently reviewed.  Records reviewed and summated above. Stroke: Continue secondary stroke prophylaxis and Risk Factor Modification listed below:   Antiplatelet therapy:   Blood Pressure Management:  Continue current medication with prn's with permisive HTN per primary team Statin Agent:   Tobacco abuse:  Counsel Left sided hemiparesis: fit for orthosis to prevent contractures (resting hand splint for day, wrist cock up splint at night, PRAFO, etc) Motor recovery: Fluoxetine  1. Does the need for close, 24 hr/day medical supervision in concert with the patient's rehab needs make it unreasonable for this patient to be served in a less intensive setting? Yes 2. Co-Morbidities requiring supervision/potential complications: HTN (monitor and provide prns in accordance with increased physical exertion and pain), right foot fractures 03/2017, pain (Biofeedback training with therapies to help reduce reliance on opiate and IV pain medications, particularly IV toradol, monitor pain control during therapies, and sedation at rest and titrate to maximum efficacy to ensure participation and gains in therapies), hypokalemia (continue to monitor and replete as necessary), ABLA (transfuse if necessary to ensure appropriate perfusion for increased activity tolerance) 3. Due to safety, disease management, pain management and patient education, does the patient require 24 hr/day rehab nursing?  Yes 4. Does the patient require coordinated care of a physician, rehab nurse, PT (1-2 hrs/day, 5 days/week), OT (1-2 hrs/day, 5 days/week) and SLP (1-2 hrs/day, 5 days/week) to address physical and functional deficits in the context of the above medical diagnosis(es)? Yes Addressing deficits in the following areas: balance, endurance, locomotion, strength, transferring, bathing, dressing, grooming, toileting, cognition, speech, swallowing and psychosocial support 5. Can the  patient actively participate in an intensive therapy program of at least 3 hrs of therapy per day at least 5 days per week? Yes 6. The potential for patient to make measurable gains while on inpatient rehab is excellent 7. Anticipated functional outcomes upon discharge from inpatient rehab are min assist and mod assist  with PT, min assist with OT, modified independent with SLP. 8. Estimated rehab length of stay to reach the above functional goals is: 20-24 days. 9. Anticipated D/C setting: Home 10. Anticipated post D/C treatments: HH therapy and Home excercise program 11. Overall Rehab/Functional Prognosis: good  RECOMMENDATIONS: This patient's condition is appropriate for continued rehabilitative care in the following setting: CIR after completion of medical workup Patient has agreed to participate in recommended program. Yes Note that insurance prior authorization may be required for reimbursement for recommended care.  Comment: Rehab Admissions Coordinator to follow up.  Delice Lesch, MD, ABPMR [facility], [place] 05/05/2017    Revision History                        Routing History

## 2017-05-07 NOTE — Evaluation (Signed)
Speech Language Pathology Assessment and Plan  Patient Details  Name: Kelly Lowe MRN: [id number] Date of Birth: 07-May-1951  SLP Diagnosis: Cognitive Impairments;Dysphagia  Rehab Potential: Good ELOS: 21-28 days     Today's Date: 05/07/2017 SLP Individual Time: 1304-1400 SLP Individual Time Calculation (min): 56 min   Problem List:  Patient Active Problem List   Diagnosis Date Noted  . Benign essential HTN   . Slow transit constipation   . Embolic stroke of right basal ganglia ([place]) 05/06/2017  . Essential hypertension 05/05/2017  . Tobacco use 05/05/2017  . Cerebral infarction ([place])   . Pain   . Hypokalemia   . Acute blood loss anemia   . CVA (cerebral vascular accident) ([place]) 05/03/2017   Past Medical History:  Past Medical History:  Diagnosis Date  . Hypertension    Past Surgical History:  Past Surgical History:  Procedure Laterality Date  . FOOT SURGERY      Assessment / Plan / Recommendation Clinical Impression   Kelly Lowe is a 66 y.o. female with history of HTN, right foot fractures 03/2017;  who was admitted on 05/03/17 with left sided weakness, right gaze preference and left facial droop. CT head reviewed, showing right basal gangial infarct. Per report, age indeterminate right caudate infarct and CTA head/neck revealed intracranial atherosclerosis involving R> L PCA. MRI brain done revealing  Non-hemorrhagic infarct in right basal ganglia, punctate infarct in left splenium and moderate underlying small vessel disease.   Patient with resultant deficits in mobility and self care tasks due to dense left hemiparesis with sensory deficits, right gaze preference, decrease awareness of deficits as well as delayed processing due to higher level deficits. CIR recommended for follow up therapy.  SLP evaluation was completed on 05/07/2017 with results as follows:  Pt presents with a mild oral dysphagia due to left sided labial and lingual weakness impacting  containment, transit, and clearance of solids.  Pt and family are well aware of her swallowing precautions and continue to do an excellent job complying with therapist's recommendations.  For now, recommend that pt remain on dys 3 textures and thin liquids with trials of regular textures with SLP to continue working towards progression.   Pt also demonstrates mild higher level cognitive deficits characterized by decreased selective attention to tasks in the presence of environmental distractions, decreased emergent awareness of deficits, and decreased functional problem solving.  As a result, pt would benefit from skilled ST while inpatient in order to maximize functional independence and reduce burden of care prior to discharge home.  Anticipate that pt will need 24/7 supervision at discharge in addition to [facility] [place] follow up at next level of care.    Skilled Therapeutic Interventions          Cognitive-linguistic evaluation completed with results and recommendations reviewed with patient and family. Pt cleared residual solids from the oral cavity with supervision verbal cues for use of lingual sweep.  Pt demonstrated delayed coughing when challenged to take large, consecutive cup sips via straw which were mitigated with small controlled sips.  Pt does not inherently demonstrate impulsivity with PO intake and naturally uses appropriate rate and portion control.  Pt had mild coating of tongue and therapist discussed oral care techniques with pt's family to minimize bacterial load.  Discussed utilizing CHG mouthwash with RN.  Pt needed intermittent mod assist during semi-complex tasks due to the cognitive deficits mentioned above.  Pt and family were both in agreement with recommended goals and plan of  care.  All questions were answered to their satisfaction at this time.  Pt left in wheelchair with family at bedside.      SLP Assessment  Patient will need skilled Speech Lanaguage Pathology Services during CIR  admission    Recommendations  SLP Diet Recommendations: Dysphagia 3 (Mech soft);Thin Liquid Administration via: Cup;Straw Medication Administration: Whole meds with puree Supervision: Patient able to self feed;Full supervision/cueing for compensatory strategies Compensations: Slow rate;Small sips/bites;Lingual sweep for clearance of pocketing Postural Changes and/or Swallow Maneuvers: Seated upright 90 degrees;Upright 30-60 min after meal Oral Care Recommendations: Oral care BID Recommendations for Other Services: Neuropsych consult Patient destination: Home Follow up Recommendations: Outpatient SLP;24 hour supervision/assistance Equipment Recommended: None recommended by SLP    SLP Frequency 3 to 5 out of 7 days   SLP Duration  SLP Intensity  SLP Treatment/Interventions 21-28 days   Minumum of 1-2 x/day, 30 to 90 minutes  Cognitive remediation/compensation;Cueing hierarchy;Dysphagia/aspiration precaution training;Internal/external aids;Functional tasks;Patient/family education    Pain Pain Assessment Pain Assessment: No/denies pain  Prior Functioning Cognitive/Linguistic Baseline: Within functional limits Type of Home: House  Lives With: Alone Available Help at Discharge: Family;Available 24 hours/day Vocation: Full time employment  Function:  Eating Eating   Modified Consistency Diet: Yes Eating Assist Level: Set up assist for;More than reasonable amount of time   Eating Set Up Assist For: Opening containers       Cognition Comprehension Comprehension assist level: Follows complex conversation/direction with extra time/assistive device  Expression   Expression assist level: Expresses complex ideas: With extra time/assistive device  Social Interaction Social Interaction assist level: Interacts appropriately with others with medication or extra time (anti-anxiety, antidepressant).  Problem Solving Problem solving assist level: Solves basic 50 - 74% of the  time/requires cueing 25 - 49% of the time  Memory Memory assist level: Recognizes or recalls 90% of the time/requires cueing < 10% of the time   Short Term Goals: Week 1: SLP Short Term Goal 1 (Week 1): Pt will consume dys 3 textures and thin liquids with mod I use of swallowing precautions and minimal overt s/s of aspiration  SLP Short Term Goal 2 (Week 1): Pt will complete semi-complex tasks with min assist verbal cues for functional problem solving.   SLP Short Term Goal 3 (Week 1): Pt will selectively attned to tasks for 20 minutes with supervision verbal cues for redirection.   SLP Short Term Goal 4 (Week 1): Pt will consume therapeutic trials of regular textures with mod I use of swallowing precautions over 3 consecutive sessions prior to advancement.   SLP Short Term Goal 5 (Week 1): Pt will recognize and correct errors in the moment during semi-complex tasks with min assist verbal cues.    Refer to Care Plan for Long Term Goals  Recommendations for other services: Neuropsych  Discharge Criteria: Patient will be discharged from SLP if patient refuses treatment 3 consecutive times without medical reason, if treatment goals not met, if there is a change in medical status, if patient makes no progress towards goals or if patient is discharged from hospital.  The above assessment, treatment plan, treatment alternatives and goals were discussed and mutually agreed upon: by patient and by family  Miklos Bidinger, Selinda Orion 05/07/2017, 2:51 PM

## 2017-05-07 NOTE — Evaluation (Signed)
Occupational Therapy Assessment and Plan  Patient Details  Name: Kelly Lowe MRN: [id number] Date of Birth: 05/22/1951  OT Diagnosis: abnormal posture, flaccid hemiplegia and hemiparesis and muscle weakness (generalized) Rehab Potential: Rehab Potential (ACUTE ONLY): Good ELOS: 21-24 days   Today's Date: 05/07/2017 OT Individual Time: 0277-4128 OT Individual Time Calculation (min): 59 min     Problem List:  Patient Active Problem List   Diagnosis Date Noted  . Benign essential HTN   . Slow transit constipation   . Embolic stroke of right basal ganglia ([place]) 05/06/2017  . Essential hypertension 05/05/2017  . Tobacco use 05/05/2017  . Cerebral infarction ([place])   . Pain   . Hypokalemia   . Acute blood loss anemia   . CVA (cerebral vascular accident) ([place]) 05/03/2017    Past Medical History:  Past Medical History:  Diagnosis Date  . Hypertension    Past Surgical History:  Past Surgical History:  Procedure Laterality Date  . FOOT SURGERY      Assessment & Plan Clinical Impression: Patient is a 66 y.o. year old female with recent admission to the hospital on 05/03/17 with left sided weakness, right gaze preference and left facial droop. History taken from chart review, patient, and sister. CT head reviewed, showing right basal gangial infarct. Per report, age indeterminate right caudate infarct and CTA head/neck revealed intracranial atherosclerosis involving R>L PCA.MRI brain done revealing Non-hemorrhagic infarct in right basal ganglia, punctate infarct in left splenium and moderate underlying small vessel disease.  Patient transferred to CIR on 05/06/2017 .    Patient currently requires max with basic self-care skills secondary to muscle weakness, impaired timing and sequencing, unbalanced muscle activation and decreased coordination and decreased sitting balance, decreased standing balance, decreased postural control, hemiplegia and decreased balance strategies.  Prior  to hospitalization, patient could complete ADLs with independent .  Patient will benefit from skilled intervention to decrease level of assist with basic self-care skills and increase independence with basic self-care skills prior to discharge home with care partner.  Anticipate patient will require minimal physical assistance and follow up home health.  OT - End of Session Activity Tolerance: Improving Endurance Deficit: Yes OT Assessment Rehab Potential (ACUTE ONLY): Good OT Patient demonstrates impairments in the following area(s): Balance;Motor;Safety OT Basic ADL's Functional Problem(s): Eating;Grooming;Bathing;Dressing;Toileting OT Transfers Functional Problem(s): Toilet;Tub/Shower OT Additional Impairment(s): Fuctional Use of Upper Extremity OT Plan OT Intensity: Minimum of 1-2 x/day, 45 to 90 minutes OT Frequency: 5 out of 7 days OT Duration/Estimated Length of Stay: 21-24 days OT Treatment/Interventions: Balance/vestibular training;Cognitive remediation/compensation;Community reintegration;DME/adaptive equipment instruction;Disease mangement/prevention;Discharge planning;Functional electrical stimulation;Functional mobility training;Neuromuscular re-education;Psychosocial support;Patient/family education;Pain management;Self Care/advanced ADL retraining;Splinting/orthotics;Therapeutic Activities;Therapeutic Exercise;UE/LE Strength taining/ROM;UE/LE Coordination activities OT Self Feeding Anticipated Outcome(s): modified independent OT Basic Self-Care Anticipated Outcome(s): min assist OT Toileting Anticipated Outcome(s): min assist OT Bathroom Transfers Anticipated Outcome(s): min assist OT Recommendation Patient destination: Home Follow Up Recommendations: Home health OT;24 hour supervision/assistance Equipment Recommended: 3 in 1 bedside comode;Tub/shower bench;To be determined   Skilled Therapeutic Intervention Pt began working on selfcare retraining sit to stand at the sink  during session.  Max hand over hand assistance for integration of the LUE into bathing tasks and applying deodorant.  She needed mod instructional cueing for positioning of the LUE in her lap prior to completion of sit to stand for LB selfcare tasks.  Pt with right boot on her foot secondary to fx of the foot in August.  Will check with MD on consult for orthopedics to see  if it has been long enough to remove it.  Increased rotation to the right and trunk flexion noted in standing when attempting to wash peri area and pull garments over hips.  Discussed the need for pull up pants instead of fastening ones, which pt states family is going to bring.  Issued half lap tray for support of the LUE while sitting in wheelchair.    OT Evaluation Precautions/Restrictions  Precautions Precautions: Fall Precaution Comments: R gaze preference Restrictions Weight Bearing Restrictions: No RLE Weight Bearing: Weight bearing as tolerated  Pain Pain Assessment Pain Assessment: No/denies pain Home Living/Prior Functioning Home Living Living Arrangements: Alone Available Help at Discharge: Family, Available 24 hours/day Type of Home: House Home Access: Stairs to enter [employer] of Steps: 6 STE Entrance Stairs-Rails: Right, Left Home Layout: Two level Additional Comments: Daughters present, pt will go home with eldest daughter Kelly Lowe  Lives With: Alone IADL History Homemaking Responsibilities: Yes Meal Prep Responsibility: Primary Laundry Responsibility: Primary Cleaning Responsibility: Primary Bill Paying/Finance Responsibility: Primary Shopping Responsibility: Primary Current License: Yes Occupation: Full time employment Prior Function Level of Independence: Independent with basic ADLs, Independent with gait  Able to Take Stairs?: Yes Driving: No (not driving because of her broken foot) Vocation: Full time employment Comments: 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, enjoys shopping and  cooking ADL  See Function Section of chart Vision Baseline Vision/History: Wears glasses Wears Glasses: Reading only Patient Visual Report: No change from baseline Vision Assessment?: Yes Eye Alignment: Within Functional Limits Ocular Range of Motion: Within Functional Limits Alignment/Gaze Preference: Gaze right Tracking/Visual Pursuits: Able to track stimulus in all quads without difficulty Saccades: Within functional limits Convergence: Within functional limits Visual Fields: No apparent deficits Additional Comments: Pt tends to keep head turned to the left but with gross testing could scan across midline to the left to track moving object appropriately as well demonstrate occular ROM WFLS.  Peripheral fields WFLS as well for gross testing with pt scanning across midline to locate grooming items with min instructional cueing.   Perception  Perception: Within Functional Limits Praxis Praxis: Intact Cognition Overall Cognitive Status: Impaired/Different from baseline Arousal/Alertness: Awake/alert Orientation Level: Person;Place;Situation Person: Oriented Place: Oriented Situation: Oriented Year: 2018 Month: October Day of Week: Correct Memory: Appears intact Immediate Memory Recall: Sock;Blue;Bed Memory Recall: Bed;Blue Memory Recall Sock: Without Cue Memory Recall Blue: Without Cue Memory Recall Bed: Without Cue Attention: Sustained;Selective Sustained Attention: Appears intact Selective Attention: Impaired Selective Attention Impairment: Verbal basic;Functional basic Awareness: Impaired (Pt able to correctly identify physical deficits of weakness on the left side from stroke but unaware of head turn to the right) Awareness Impairment: Emergent impairment Problem Solving: Impaired Problem Solving Impairment: Functional complex Safety/Judgment: Impaired Comments: left inattention  Sensation Sensation Light Touch: Appears Intact Stereognosis: Not tested Hot/Cold:  Not tested Proprioception: Appears Intact;Impaired Detail Proprioception Impaired Details: Impaired LUE Additional Comments: Proprioception intact in left shoulder and hand, slight differences noted at the elbow. Coordination Gross Motor Movements are Fluid and Coordinated: No Fine Motor Movements are Fluid and Coordinated: No Coordination and Movement Description: Pt currently Brunnstrum stage I in the left arm and hand.  No active movement noted at this time.   Motor  Motor Motor: Hemiplegia Motor - Skilled Clinical Observations: L hemiplegia  Mobility    See Function Section of chart for details  Trunk/Postural Assessment  Cervical Assessment Cervical Assessment: Exceptions to [facility] (cervical rotation to the right) Thoracic Assessment Thoracic Assessment: Exceptions to [facility] (thoracic kyphosis with left trunk  elongation and right side shortening) Lumbar Assessment Lumbar Assessment: Exceptions to [facility] (posterior pelvic tilt)  Balance Balance Balance Assessed: Yes Static Sitting Balance Static Sitting - Level of Assistance: 5: Stand by assistance Dynamic Sitting Balance Dynamic Sitting - Balance Support: During functional activity Dynamic Sitting - Level of Assistance: 4: Min assist Static Standing Balance Static Standing - Balance Support: During functional activity Static Standing - Level of Assistance: 2: Max assist Dynamic Standing Balance Dynamic Standing - Balance Support: During functional activity Dynamic Standing - Level of Assistance: 2: Max assist Extremity/Trunk Assessment RUE Assessment RUE Assessment: Within Functional Limits LUE Assessment LUE Assessment: Exceptions to [facility] (Pt currently Brunnstrum stage I in the arm and hand with no active movement noted.  One finger inferior subluxation noted in the shoulder.  Max hand over hand for functional use during selfcare.)   See Function Navigator for Current Functional Status.   Refer to Care Plan for Long Term  Goals  Recommendations for other services: None    Discharge Criteria: Patient will be discharged from OT if patient refuses treatment 3 consecutive times without medical reason, if treatment goals not met, if there is a change in medical status, if patient makes no progress towards goals or if patient is discharged from hospital.  The above assessment, treatment plan, treatment alternatives and goals were discussed and mutually agreed upon: by patient  Chantella Creech OTR/L 05/07/2017, 4:55 PM

## 2017-05-07 NOTE — Care Management Note (Signed)
[facility] Individual Statement of Services  Patient Name:  Kelly Lowe  Date:  05/07/2017  Welcome to the [facility].  Our goal is to provide you with an individualized program based on your diagnosis and situation, designed to meet your specific needs.  With this comprehensive rehabilitation program, you will be expected to participate in at least 3 hours of rehabilitation therapies Monday-Friday, with modified therapy programming on the weekends.  Your rehabilitation program will include the following services:  Physical Therapy (PT), Occupational Therapy (OT), Speech Therapy (ST), 24 hour per day rehabilitation nursing, Neuropsychology, Case Management (Social Worker), Rehabilitation Medicine, Nutrition Services and Pharmacy Services  Weekly team conferences will be held on Wednesday to discuss your progress.  Your Social Worker will talk with you frequently to get your input and to update you on team discussions.  Team conferences with you and your family in attendance may also be held.  Expected length of stay: 3-3.5 weeks  Overall anticipated outcome: min assist level  Depending on your progress and recovery, your program may change. Your Social Worker will coordinate services and will keep you informed of any changes. Your Social Worker's name and contact numbers are listed  below.  The following services may also be recommended but are not provided by the [facility] will be made to provide these services after discharge if needed.  Arrangements include referral to agencies that provide these services.  Your insurance has been verified to be:  [place] Your primary doctor is:  Carol Ada  Pertinent information will be shared with your doctor and your  insurance company.  Social Worker:  Ovidio Kin, [place] or [phone]  Information discussed with and copy given to patient by: [name], 05/07/2017, 10:06 AM

## 2017-05-07 NOTE — Progress Notes (Signed)
Social Work Assessment and Plan Social Work Assessment and Plan  Patient Details  Name: Kelly Lowe MRN: [id number] Date of Birth: 1951/04/19  Today's Date: 05/07/2017  Problem List:  Patient Active Problem List   Diagnosis Date Noted  . Benign essential HTN   . Slow transit constipation   . Embolic stroke of right basal ganglia ([place]) 05/06/2017  . Essential hypertension 05/05/2017  . Tobacco use 05/05/2017  . Cerebral infarction ([place])   . Pain   . Hypokalemia   . Acute blood loss anemia   . CVA (cerebral vascular accident) ([place]) 05/03/2017   Past Medical History:  Past Medical History:  Diagnosis Date  . Hypertension    Past Surgical History:  Past Surgical History:  Procedure Laterality Date  . FOOT SURGERY     Social History:  reports that she has been smoking Cigarettes.  She has a 12.50 pack-year smoking history. She has never used smokeless tobacco. She reports that she drinks about 0.6 oz of alcohol per week . She reports that she does not use drugs.  Family / Support Systems Marital Status: Separated Patient Roles: Parent, Other (Comment) (employee) Children: Kelly Lowe [phone] Other Supports: Kelly Lowe-daughter [phone] Anticipated Caregiver: Kelly Lowe Ability/Limitations of Caregiver: none plans to be here often to see her progress Caregiver Availability: 24/7 Family Dynamics: Close knit family both daughter's are involved and supportive. The plan is to go to Kelly Lowe's home in [place] where she can get 24 hr care. Kelly Purpura is pregnant and has a 47 yo daughter she is busy with. Pt has co-workers and church members who are supportive and viait her.  Social History Preferred language: English Religion: Baptist Cultural Background: No issues Education: Therapist, occupational Read: Yes Write: Yes Employment Status: Employed Name of Employer: [employer] Return to Work Plans: Depends upon her progress and how she does, she was already out with her  foot. Legal Hisotry/Current Legal Issues: No issues Guardian/Conservator: None-according to MD pt is capable of making her own decisions while here-daughter's alos want to be involved while Mom is here.   Abuse/Neglect Physical Abuse: Denies Verbal Abuse: Denies Sexual Abuse: Denies Exploitation of patient/patient's resources: Denies Self-Neglect: Denies  Emotional Status Pt's affect, behavior adn adjustment status: Pt is motivated to do well and recover, she is hoping her boot can come off so she will be better able to participate in therapies with team. She is still adjusting to what has happened to her. She has always been one to push forward and challenge herself to do her best. This will be no exeception. Recent Psychosocial Issues: foot fracture and in a boot from 03/2017 Pyschiatric History: No issues-would benefit from seeing neuro-psych while here, due to much has happened to her in a short period of time. Still adjusting to the rehab unit and therapy program. She is able to verbalize her concernes and desires. Substance Abuse History: Tobacco will try to quit now, aware of resources to help her once discharged from hospital  Patient / Family Perceptions, Expectations & Goals Pt/Family understanding of illness & functional limitations: Pt and daughter's can explain her condition and have spoken with the MD's and feel thier questions have been addressed and answered. Both plan to be here often to see Mom's progress and talk with the MD Premorbid pt/family roles/activities: Mom, grandmother, employee, church member, friend, etc Anticipated changes in roles/activities/participation: resume Pt/family expectations/goals: Pt states: " I want to get as good as I can I not one to be taken care  of."  Daughter states: " We will do whatever is needed, she would for [place]."   [employer]: None Premorbid Home Care/DME Agencies: Other (Comment) (cane from fractured  foot) Transportation available at discharge: Daughter's Resource referrals recommended: Neuropsychology, Support group (specify)  Discharge Planning Living Arrangements: Alone Support Systems: Children, Other relatives, Friends/neighbors, Church/faith community Type of Residence: Private residence Insurance Resources: Multimedia programmer (specify), Medicare ([employer]) Financial Resources: Employment Financial Screen Referred: No Living Expenses: Education officer, community Management: Patient Does the patient have any problems obtaining your medications?: No Home Management: Patient was doing up unitl foot fracture Patient/Family Preliminary Plans: Plan now is to go to daughter's-Kelly Lowe hom in [place] when discharged from rehab. All aware she will require care and are prepared to provide this. Both plan to be here daily and see her progress and make adaptations to their home for her. Social Work Anticipated Follow Up Needs: HH/OP, Support Group  Clinical Impression Pleasant patient who is motivated and ready to work hard in therapies to recover from this stroke. She is hoping her boot can come off since it has been six weeks this would help with her standing balance.  Both daughter's are here and very involved and supportive. Will await therapy team's evaluations and work on a discharge plan. Daughter is aware of the need for education prior to discharge and ned for possible ramp into Home. Feel pt would benefit from seeing neuro-psych while here, will make referral.  Kelly Lowe, Kelly Lowe 05/07/2017, 2:52 PM

## 2017-05-07 NOTE — Patient Care Conference (Signed)
Inpatient RehabilitationTeam Conference and Plan of Care Update Date: 05/07/2017   Time: 2:15 PM    Patient Name: Kelly Lowe      Medical Record Number: [id number]  Date of Birth: 05-Mar-1951 Sex: Female         Room/Bed: 4M08C/4M08C-01 Payor Info: Payor: Theme park manager / Plan: Hooks / Product Type: *No Product type* /    Admitting Diagnosis: FTS RT CVA  Admit Date/Time:  05/06/2017  5:58 PM Admission Comments: No comment available   Primary Diagnosis:  Embolic stroke of right basal ganglia (HCC) Principal Problem: Embolic stroke of right basal ganglia [facility])  Patient Active Problem List   Diagnosis Date Noted  . Closed fracture of bone of right foot   . Benign essential HTN   . Slow transit constipation   . Embolic stroke of right basal ganglia ([place]) 05/06/2017  . Essential hypertension 05/05/2017  . Tobacco use 05/05/2017  . Cerebral infarction ([place])   . Pain   . Hypokalemia   . Acute blood loss anemia   . CVA (cerebral vascular accident) ([place]) 05/03/2017    Expected Discharge Date: Expected Discharge Date: 05/28/17  Team Members Present: Physician leading conference: Dr. Delice Lesch Social Worker Present: Ovidio Kin, LCSW Nurse Present: Benjie Karvonen, RN PT Present: Michaelene Song, PT OT Present: Clyda Greener, OT SLP Present: Windell Moulding, SLP PPS Coordinator present : Daiva Nakayama, RN, CRRN     Current Status/Progress Goal Weekly Team Focus  Medical   Left hemiparesis and functional deficits secondary to right basal ganglia infarct with recent right foot fracture  Improve HTN, ABLA, Consitaption, callous on feet  See above   Bowel/Bladder   LBM per report 9/23. Continent of bowel. Voided on 10/2 and had been using purewick, Unable to void overnight and today, I&O caths  Mod      Swallow/Nutrition/ Hydration   Dys 3, thin liquids  mod I   trials of regular textures    ADL's   mod to max assist for UB and LB selfcare as well as squat  pivot transfers  min assist goals overall  selfcare retraining, balance retraining, neuromuscular re-education, pt/family education, therapeutic exercise   Mobility   mod-max A transfers, mod A w/c mobility  min A gait and transfers  initiate gait, independence w/ w/c mobility, transfers, balance, L NMR   Communication             Safety/Cognition/ Behavioral Observations  mild cognitive deficits  supervision   emergent awareness, left inattention, higher level problem solving   Pain   Back pain, Kpad requested. intermittent use of voltaren gel and tramadol         Skin                *See Care Plan and progress notes for long and short-term goals.     Barriers to Discharge  Current Status/Progress Possible Resolutions Date Resolved   Physician    Medical stability     See above  Therapies, follow labs, optimize bowel reg, ?Podiatry consult      Nursing  Home environment access/layout;Neurogenic Bowel & Bladder               PT  Home environment access/layout  6 STE              OT                  SLP  SW                Discharge Planning/Teaching Needs:    Home to daughter's home who can provide 24 hr care. Both daughter's plan to be here to see her progress and know her care needs.     Team Discussion:  Goals-min assist level. MD to check on how much longer to wear the boot for foot. MD watching labs and BP. Suppository today due to constipated. Not voiding I & O cathed today-new issue. Dys 3 trial of thin. R-gaze preference-aware of her deficits.  Revisions to Treatment Plan:  New eval-tentative DC 10/24    Continued Need for Acute Rehabilitation Level of Care: The patient requires daily medical management by a physician with specialized training in physical medicine and rehabilitation for the following conditions: Daily direction of a multidisciplinary physical rehabilitation program to ensure safe treatment while eliciting the highest outcome that is  of practical value to the patient.: Yes Daily medical management of patient stability for increased activity during participation in an intensive rehabilitation regime.: Yes Daily analysis of laboratory values and/or radiology reports with any subsequent need for medication adjustment of medical intervention for : Neurological problems;Blood pressure problems;Other  [name] 05/08/2017, 12:37 PM

## 2017-05-08 ENCOUNTER — Encounter (HOSPITAL_COMMUNITY)
Admission: RE | Disposition: A | Discharge status: Home Health | Payer: Self-pay | Source: Intra-hospital | Attending: Physical Medicine & Rehabilitation

## 2017-05-08 ENCOUNTER — Inpatient Hospital Stay (HOSPITAL_COMMUNITY): Payer: 59

## 2017-05-08 ENCOUNTER — Inpatient Hospital Stay (HOSPITAL_COMMUNITY): Payer: Medicare Other | Admitting: Physical Therapy

## 2017-05-08 ENCOUNTER — Encounter (HOSPITAL_COMMUNITY): Payer: Self-pay

## 2017-05-08 ENCOUNTER — Inpatient Hospital Stay (HOSPITAL_COMMUNITY): Payer: Medicare Other | Admitting: Speech Pathology

## 2017-05-08 ENCOUNTER — Inpatient Hospital Stay (HOSPITAL_COMMUNITY): Payer: Medicare Other | Admitting: Occupational Therapy

## 2017-05-08 ENCOUNTER — Ambulatory Visit (HOSPITAL_COMMUNITY): Admission: RE | Admit: 2017-05-08 | Payer: 59 | Source: Ambulatory Visit | Admitting: Cardiology

## 2017-05-08 ENCOUNTER — Encounter (HOSPITAL_COMMUNITY): Payer: Self-pay | Admitting: Cardiology

## 2017-05-08 ENCOUNTER — Inpatient Hospital Stay (HOSPITAL_COMMUNITY): Payer: 59 | Admitting: Occupational Therapy

## 2017-05-08 DIAGNOSIS — I6389 Other cerebral infarction: Secondary | ICD-10-CM

## 2017-05-08 DIAGNOSIS — S92901D Unspecified fracture of right foot, subsequent encounter for fracture with routine healing: Secondary | ICD-10-CM

## 2017-05-08 DIAGNOSIS — S92901A Unspecified fracture of right foot, initial encounter for closed fracture: Secondary | ICD-10-CM | POA: Insufficient documentation

## 2017-05-08 HISTORY — PX: TEE WITHOUT CARDIOVERSION: SHX5443

## 2017-05-08 LAB — MRSA PCR SCREENING: MRSA by PCR: NEGATIVE

## 2017-05-08 SURGERY — ECHOCARDIOGRAM, TRANSESOPHAGEAL
Anesthesia: Moderate Sedation

## 2017-05-08 MED ORDER — FENTANYL CITRATE (PF) 100 MCG/2ML IJ SOLN
INTRAMUSCULAR | Status: AC
  Filled 2017-05-08: qty 2

## 2017-05-08 MED ORDER — BUTAMBEN-TETRACAINE-BENZOCAINE 2-2-14 % EX AERO
INHALATION_SPRAY | CUTANEOUS | Status: DC | PRN
  Administered 2017-05-08: 2 via TOPICAL

## 2017-05-08 MED ORDER — MIDAZOLAM HCL 10 MG/2ML IJ SOLN
INTRAMUSCULAR | Status: DC | PRN
  Administered 2017-05-08: 2 mg via INTRAVENOUS
  Administered 2017-05-08: 1 mg via INTRAVENOUS

## 2017-05-08 MED ORDER — MIDAZOLAM HCL 5 MG/ML IJ SOLN
INTRAMUSCULAR | Status: AC
  Filled 2017-05-08: qty 2

## 2017-05-08 MED ORDER — FLUOXETINE HCL 10 MG PO CAPS
10.0000 mg | ORAL_CAPSULE | Freq: Every day | ORAL | Status: DC
  Administered 2017-05-08 – 2017-05-15 (×8): 10 mg via ORAL
  Filled 2017-05-08 (×8): qty 1

## 2017-05-08 MED ORDER — FENTANYL CITRATE (PF) 100 MCG/2ML IJ SOLN
INTRAMUSCULAR | Status: DC | PRN
  Administered 2017-05-08: 25 ug via INTRAVENOUS

## 2017-05-08 NOTE — H&P (Signed)
    Transesophageal Echocardiogram Note  KAIDAN SPENGLER [id number] [birth year]  Procedure: Transesophageal Echocardiogram Indications: CVA   Procedure Details Consent: Obtained Time Out: Verified patient identification, verified procedure, site/side was marked, verified correct patient position, special equipment/implants available, Radiology Safety Procedures followed,  medications/allergies/relevent history reviewed, required imaging and test results available.  Performed  Medications:  During this procedure the patient is administered a total of Versed 3 mg and Fentanyl 25 mcg  to achieve and maintain moderate conscious sedation.  The patient's heart rate, blood pressure, and oxygen saturation are monitored continuously during the procedure. The period of conscious sedation is 30 minutes, of which I was present face-to-face 100% of this time.  Normal LV function; trace AI and MR; negative saline microcavitation study.   Complications: No apparent complications Patient did tolerate procedure well.  Kirk Ruths, MD

## 2017-05-08 NOTE — Interval H&P Note (Signed)
History and Physical Interval Note:  05/08/2017 3:04 PM  Kelly Lowe  has presented today for surgery, with the diagnosis of STROKE  The various methods of treatment have been discussed with the patient and family. After consideration of risks, benefits and other options for treatment, the patient has consented to  Procedure(s): TRANSESOPHAGEAL ECHOCARDIOGRAM (TEE) (N/A) as a surgical intervention .  The patient's history has been reviewed, patient examined, no change in status, stable for surgery.  I have reviewed the patient's chart and labs.  Questions were answered to the patient's satisfaction.     Kirk Ruths

## 2017-05-08 NOTE — Progress Notes (Signed)
Occupational Therapy Session Note  Patient Details  Name: Kelly Lowe MRN: [id number] Date of Birth: 05/11/1951  Today's Date: 05/08/2017 OT Individual Time: 0930-1045 OT Individual Time Calculation (min): 75 min    Short Term Goals: Week 1:  OT Short Term Goal 1 (Week 1): Pt will complete UB bathing with min assist sitting unsupported.  OT Short Term Goal 2 (Week 1): Pt will complete LB bathing with mod assist sit to stand.  OT Short Term Goal 3 (Week 1): Pt will perform UB dressing with min assist for donning pullover shirt following hemidressing techniques. OT Short Term Goal 4 (Week 1): Pt will perform toilet transfers to drop arm commode squat pivot with min assist.  OT Short Term Goal 5 (Week 1): Pt will return demonstrate AAROM exercises for the LUE following handout with supervision.    Skilled Therapeutic Interventions/Progress Updates:    Pt seen for ADL training with a focus on trunk control, visual scanning, sit to stand.  Pt received in recliner with daughter in the room. Pt stated she was eager for a shower and had already toileted with nursing earlier.  From recliner worked on sit to stand with min A and then was able to stand fully upright engaging her hips and held her midline position for 4 minutes while we discussed her needs.  Pt in standing scanned her head to the L to find items in the room.   Pt sat back on the pads and was transferred to open seat tub bench in shower. Before turning the water on with her R CAM boot on,  pt practiced sit to stand with R hand on grab bar to her R with min A. On bench, she maintained her balance well. At one point when she was using her R arm to wash her L arm she leaned too far to the left and got off balance  But self corrected with cues.  She could not stand in shower as her CAM boot was off.  The RN came to the room to let us know that she just needed to be in a gown for a procedure later today.  Pt transferred out of shower with  STEDY and transferred to recliner.  Education with pt and daughters on safe PROM techniques. She has increased tone in shoulder and elbow.  Good PROM in L wrist and hand with no tone.  Pt positioned in recliner with pillows. All needs met.  Therapy Documentation Precautions:  Precautions Precautions: Fall Precaution Comments: R gaze preference Restrictions Weight Bearing Restrictions: No RLE Weight Bearing: Weight bearing as tolerated Other Position/Activity Restrictions: Must be in boot for any weight bearing  Pain: Pain Assessment Pain Assessment: No/denies pain ADL:   See Function Navigator for Current Functional Status.   Therapy/Group: Individual Therapy  SAGUIER,JULIA 05/08/2017, 12:00 PM 

## 2017-05-08 NOTE — H&P (View-Only) (Signed)
[facility]IC[facility]ILIT[facility]     PROGRESS NOTE  Subjective/Complaints:  Pt seen laying in bed this AM.  Per patient and daughter, pt slept well overnight.  She is NPO for TEE today, which is scheduled for the end of the day.  She would like her coffee.    ROS: Denies CP, SOB, N/V/D.  Objective: Vital Signs: Blood pressure (!) 133/59, pulse (!) 59, temperature 98.3 F (36.8 C), temperature source Oral, resp. rate 18, height 5\' 7"  (1.702 m), weight 81.2 kg (179 lb), SpO2 96 %. Dg Foot Complete Right  Result Date: 05/07/2017 CLINICAL DATA:  Follow-up right fifth metatarsal fracture. EXAM: RIGHT FOOT COMPLETE - 3+ VIEW COMPARISON:  03/16/2017 right foot radiographs. FINDINGS: Stable mild malalignment of comminuted distal shaft fracture in the right fifth metatarsal with 3 mm medial displacement of the dominant distal fracture fragment, with evidence of interval healing including endosteal and periosteal callus and decreased visualization of the fracture lucency. No additional fracture. No dislocation. No suspicious focal osseous lesions. 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 Diffuse osteopenia. No radiopaque foreign body. IMPRESSION: Healing distal shaft fracture in the right fifth metatarsal with stable mild displacement. Diffuse osteopenia. Electronically Signed   By: Ilona Sorrel M.D.   On: 05/07/2017 20:47    Recent Labs  05/07/17 0829  WBC 6.2  HGB 11.1*  HCT 33.2*  PLT 174    Recent Labs  05/06/17 0637 05/07/17 0829  NA 136 138  K 3.5 3.8  CL 107 107  GLUCOSE 96 116*  BUN 13 12  CREATININE 0.79 0.84  CALCIUM 9.2 9.3   CBG (last 3)   Recent Labs  05/05/17 0835 05/06/17 0835  GLUCAP 104* 86    Wt Readings from Last 3 Encounters:  05/06/17 81.2 kg (179 lb)  05/03/17 87.8 kg (193 lb 8 oz)    Physical Exam:  BP (!) 133/59 (BP Location: Left Arm)   Pulse (!) 59   Temp 98.3 F (36.8 C) (Oral)   Resp 18   Ht 5\' 7"  (1.702 m)    Wt 81.2 kg (179 lb)   SpO2 96%   BMI 28.04 kg/m  Constitutional: She appears well-developedand well-nourished. NAD. HENT: Normocephalicand atraumatic.  Eyes: EOMI. No discharge.  Cardiovascular: RRR. No JVD. Respiratory: No respiratory distress. She has no wheezes. GI: She exhibits no distension. BS+ Musculoskeletal: She exhibits no edemaor deformity.  Neurological:  Right gaze preference.  Motor: LUE: 0/5 proximal to distal (stable) LLE: 1+/5 HF, and 0/5 distally.  No increase in tone Sensation intact to light touch Mild dysarthria  Follows simple commands.  Fair insight and awareness. Skin: Skin is warm.  Psychiatric: Flat.  Assessment/Plan: 1. Functional deficits secondary to right basal ganglia infarct with recent right foot fracture which require 3+ hours per day of interdisciplinary therapy in a comprehensive inpatient rehab setting. Physiatrist is providing close team supervision and 24 hour management of active medical problems listed below. Physiatrist and rehab team continue to assess barriers to discharge/monitor patient progress toward functional and medical goals.  Function:  Bathing Bathing position   Position: Wheelchair/chair at sink  Bathing parts Body parts bathed by patient: Left arm, Chest, Abdomen, Right upper leg, Left upper leg Body parts bathed by helper: Right arm, Front perineal area, Buttocks  Bathing assist        Upper Body Dressing/Undressing Upper body dressing   What is the patient wearing?: Bra, Pull over shirt/dress Bra - Perfomed by patient: Thread/unthread right  bra strap Bra - Perfomed by helper: Thread/unthread left bra strap, Hook/unhook bra (pull down sports bra) Pull over shirt/dress - Perfomed by patient: Thread/unthread right sleeve, Put head through opening Pull over shirt/dress - Perfomed by helper: Thread/unthread left sleeve, Pull shirt over trunk        Upper body assist        Lower Body  Dressing/Undressing Lower body dressing   What is the patient wearing?: Pants, Underwear, Socks, Shoes   Underwear - Performed by helper: Thread/unthread right underwear leg, Thread/unthread left underwear leg, Pull underwear up/down Pants- Performed by patient: Thread/unthread right pants leg Pants- Performed by helper: Thread/unthread left pants leg, Pull pants up/down           Shoes - Performed by helper: Don/doff right shoe, Don/doff left shoe, Fasten right, Fasten left          Lower body assist        Toileting Toileting     Toileting steps completed by helper: Adjust clothing prior to toileting, Performs perineal hygiene, Adjust clothing after toileting Toileting Assistive Devices: Grab bar or rail  Toileting assist     Transfers Chair/bed transfer   Chair/bed transfer method: Squat pivot Chair/bed transfer assist level: Maximal assist (Pt 25 - 49%/lift and lower) Chair/bed transfer assistive device: Armrests     Locomotion Ambulation     Max distance: 6 ft Assist level: Maximal assist (Pt 25 - 49%)   Wheelchair   Type: Manual Max wheelchair distance: 100 ft Assist Level: Moderate assistance (Pt 50 - 74%)  Cognition Comprehension Comprehension assist level: Follows complex conversation/direction with extra time/assistive device  Expression Expression assist level: Expresses complex ideas: With extra time/assistive device  Social Interaction Social Interaction assist level: Interacts appropriately with others with medication or extra time (anti-anxiety, antidepressant).  Problem Solving Problem solving assist level: Solves basic 50 - 74% of the time/requires cueing 25 - 49% of the time  Memory Memory assist level: Recognizes or recalls 90% of the time/requires cueing < 10% of the time    Medical Problem List and Plan: 1. Left hemiparesis and functional deficitssecondary to right basal ganglia infarct with recent right foot fracture  Cont CIR  WHO/PRAFO  ordered  Fluoxetine ordered  Contacted Ortho, partial WB RLE, films ordered, await potential change in restrictions per Ortho. Xray reviewed, displacement of 5th digit, but healing  Pt NPO for TEE today 2. DVT Prophylaxis/Anticoagulation: Pharmaceutical: Lovenox 3. Pain Management/Chronic low back pain: Ice /heat for back with ultram prn.  4. Mood: LCSW to follow for evaluation and support.  5. Neuropsych: This patient iscapable of making decisions on herown behalf. 6. Skin/Wound Care: routine pressure relief measures. Elevate RLE when seated.  7. Fluids/Electrolytes/Nutrition: Monitor I/O.   BMP within acceptable range on 10/3 8. Dyslipidemia: Continue lipitor.  9. HTN: Will resume HCTZ if indicated and will likely need Kdur for supplement.   Controlled 10/4 10. ABLA:   Hb 11.1 on 10/3  Cont to monitor 11. Constipation  Bowel reg increased on 10/3 12. 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  Podiatrist to perform on Friday per daughter   LOS (Days) 2 A FACE TO FACE EVALUATION WAS PERFORMED  Vyron Fronczak Lorie Phenix 05/08/2017 8:08 AM

## 2017-05-08 NOTE — Progress Notes (Signed)
Daughter informed me that she contacted ortho MD for changes to WB status.  Reviewed.  Informed her that films have been reviewed and ortho MD will notify of any changes.  At this time they remain without changes.

## 2017-05-08 NOTE — Progress Notes (Signed)
  Echocardiogram Echocardiogram Transesophageal has been performed.  Kelly Lowe 05/08/2017, 3:40 PM

## 2017-05-08 NOTE — Progress Notes (Signed)
Orthopedic Tech Progress Note Patient Details:  Kelly Lowe January 21, 1951 [id number]  Patient ID: Kelly Lowe, female   DOB: May 26, 1951, 66 y.o.   MRN: [id number]   Kelly Lowe 05/08/2017, 9:19 AM Called in hanger brace order; spoke with [facility]

## 2017-05-08 NOTE — Progress Notes (Signed)
Speech Language Pathology Daily Session Note  Patient Details  Name: Kelly Lowe MRN: [id number] Date of Birth: [birth year]  Today's Date: 05/08/2017 SLP Individual Time: 1105-1200 SLP Individual Time Calculation (min): 55 min  Short Term Goals: Week 1: SLP Short Term Goal 1 (Week 1): Pt will consume dys 3 textures and thin liquids with mod I use of swallowing precautions and minimal overt s/s of aspiration  SLP Short Term Goal 2 (Week 1): Pt will complete semi-complex tasks with min assist verbal cues for functional problem solving.   SLP Short Term Goal 3 (Week 1): Pt will selectively attned to tasks for 20 minutes with supervision verbal cues for redirection.   SLP Short Term Goal 4 (Week 1): Pt will consume therapeutic trials of regular textures with mod I use of swallowing precautions over 3 consecutive sessions prior to advancement.   SLP Short Term Goal 5 (Week 1): Pt will recognize and correct errors in the moment during semi-complex tasks with min assist verbal cues.    Skilled Therapeutic Interventions:  Pt was seen for skilled ST targeting cognitive goals.  Pt is NPO for scheduled TEE this afternoon so swallowing goals were not addressed on this date.  Pt was able to count money and make change for 90% accuracy independently, which improved to 100% accuracy with 1 supervision verbal cue to recognize and correct error.  Pt needed min-mod assist verbal cues for planning, task organization, and attention to detail when writing checks and balancing a check book.  Pt was returned to room and left in recliner with call bell within reach.  Continue per current plan of care.   Function:  Eating Eating                 Cognition Comprehension Comprehension assist level: Follows complex conversation/direction with extra time/assistive device  Expression   Expression assist level: Expresses complex ideas: With extra time/assistive device  Social Interaction Social Interaction  assist level: Interacts appropriately with others with medication or extra time (anti-anxiety, antidepressant).  Problem Solving Problem solving assist level: Solves basic 75 - 89% of the time/requires cueing 10 - 24% of the time  Memory Memory assist level: Recognizes or recalls 90% of the time/requires cueing < 10% of the time    Pain Pain Assessment Pain Assessment: No/denies pain Faces Pain Scale: No hurt  Therapy/Group: Individual Therapy  Jordis Repetto, Selinda Orion 05/08/2017, 4:12 PM

## 2017-05-08 NOTE — Progress Notes (Signed)
PHYSICAL MEDICINE & REHABILITATION     PROGRESS NOTE  Subjective/Complaints:  Pt seen laying in bed this AM.  Per patient and daughter, pt slept well overnight.  She is NPO for TEE today, which is scheduled for the end of the day.  She would like her coffee.    ROS: Denies CP, SOB, N/V/D.  Objective: Vital Signs: Blood pressure (!) 133/59, pulse (!) 59, temperature 98.3 F (36.8 C), temperature source Oral, resp. rate 18, height 5\' 7"  (1.702 m), weight 81.2 kg (179 lb), SpO2 96 %. Dg Foot Complete Right  Result Date: 05/07/2017 CLINICAL DATA:  Follow-up right fifth metatarsal fracture. EXAM: RIGHT FOOT COMPLETE - 3+ VIEW COMPARISON:  03/16/2017 right foot radiographs. FINDINGS: Stable mild malalignment of comminuted distal shaft fracture in the right fifth metatarsal with 3 mm medial displacement of the dominant distal fracture fragment, with evidence of interval healing including endosteal and periosteal callus and decreased visualization of the fracture lucency. No additional fracture. No dislocation. No suspicious focal osseous lesions. 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 Diffuse osteopenia. No radiopaque foreign body. IMPRESSION: Healing distal shaft fracture in the right fifth metatarsal with stable mild displacement. Diffuse osteopenia. Electronically Signed   By: Ilona Sorrel M.D.   On: 05/07/2017 20:47    Recent Labs  05/07/17 0829  WBC 6.2  HGB 11.1*  HCT 33.2*  PLT 174    Recent Labs  05/06/17 0637 05/07/17 0829  NA 136 138  K 3.5 3.8  CL 107 107  GLUCOSE 96 116*  BUN 13 12  CREATININE 0.79 0.84  CALCIUM 9.2 9.3   CBG (last 3)   Recent Labs  05/05/17 0835 05/06/17 0835  GLUCAP 104* 86    Wt Readings from Last 3 Encounters:  05/06/17 81.2 kg (179 lb)  05/03/17 87.8 kg (193 lb 8 oz)    Physical Exam:  BP (!) 133/59 (BP Location: Left Arm)   Pulse (!) 59   Temp 98.3 F (36.8 C) (Oral)   Resp 18   Ht 5\' 7"  (1.702 m)    Wt 81.2 kg (179 lb)   SpO2 96%   BMI 28.04 kg/m  Constitutional: She appears well-developedand well-nourished. NAD. HENT: Normocephalicand atraumatic.  Eyes: EOMI. No discharge.  Cardiovascular: RRR. No JVD. Respiratory: No respiratory distress. She has no wheezes. GI: She exhibits no distension. BS+ Musculoskeletal: She exhibits no edemaor deformity.  Neurological:  Right gaze preference.  Motor: LUE: 0/5 proximal to distal (stable) LLE: 1+/5 HF, and 0/5 distally.  No increase in tone Sensation intact to light touch Mild dysarthria  Follows simple commands.  Fair insight and awareness. Skin: Skin is warm.  Psychiatric: Flat.  Assessment/Plan: 1. Functional deficits secondary to right basal ganglia infarct with recent right foot fracture which require 3+ hours per day of interdisciplinary therapy in a comprehensive inpatient rehab setting. Physiatrist is providing close team supervision and 24 hour management of active medical problems listed below. Physiatrist and rehab team continue to assess barriers to discharge/monitor patient progress toward functional and medical goals.  Function:  Bathing Bathing position   Position: Wheelchair/chair at sink  Bathing parts Body parts bathed by patient: Left arm, Chest, Abdomen, Right upper leg, Left upper leg Body parts bathed by helper: Right arm, Front perineal area, Buttocks  Bathing assist        Upper Body Dressing/Undressing Upper body dressing   What is the patient wearing?: Bra, Pull over shirt/dress Bra - Perfomed by patient: Thread/unthread right  bra strap Bra - Perfomed by helper: Thread/unthread left bra strap, Hook/unhook bra (pull down sports bra) Pull over shirt/dress - Perfomed by patient: Thread/unthread right sleeve, Put head through opening Pull over shirt/dress - Perfomed by helper: Thread/unthread left sleeve, Pull shirt over trunk        Upper body assist        Lower Body  Dressing/Undressing Lower body dressing   What is the patient wearing?: Pants, Underwear, Socks, Shoes   Underwear - Performed by helper: Thread/unthread right underwear leg, Thread/unthread left underwear leg, Pull underwear up/down Pants- Performed by patient: Thread/unthread right pants leg Pants- Performed by helper: Thread/unthread left pants leg, Pull pants up/down           Shoes - Performed by helper: Don/doff right shoe, Don/doff left shoe, Fasten right, Fasten left          Lower body assist        Toileting Toileting     Toileting steps completed by helper: Adjust clothing prior to toileting, Performs perineal hygiene, Adjust clothing after toileting Toileting Assistive Devices: Grab bar or rail  Toileting assist     Transfers Chair/bed transfer   Chair/bed transfer method: Squat pivot Chair/bed transfer assist level: Maximal assist (Pt 25 - 49%/lift and lower) Chair/bed transfer assistive device: Armrests     Locomotion Ambulation     Max distance: 6 ft Assist level: Maximal assist (Pt 25 - 49%)   Wheelchair   Type: Manual Max wheelchair distance: 100 ft Assist Level: Moderate assistance (Pt 50 - 74%)  Cognition Comprehension Comprehension assist level: Follows complex conversation/direction with extra time/assistive device  Expression Expression assist level: Expresses complex ideas: With extra time/assistive device  Social Interaction Social Interaction assist level: Interacts appropriately with others with medication or extra time (anti-anxiety, antidepressant).  Problem Solving Problem solving assist level: Solves basic 50 - 74% of the time/requires cueing 25 - 49% of the time  Memory Memory assist level: Recognizes or recalls 90% of the time/requires cueing < 10% of the time    Medical Problem List and Plan: 1. Left hemiparesis and functional deficitssecondary to right basal ganglia infarct with recent right foot fracture  Cont CIR  WHO/PRAFO  ordered  Fluoxetine ordered  Contacted Ortho, partial WB RLE, films ordered, await potential change in restrictions per Ortho. Xray reviewed, displacement of 5th digit, but healing  Pt NPO for TEE today 2. DVT Prophylaxis/Anticoagulation: Pharmaceutical: Lovenox 3. Pain Management/Chronic low back pain: Ice /heat for back with ultram prn.  4. Mood: LCSW to follow for evaluation and support.  5. Neuropsych: This patient iscapable of making decisions on herown behalf. 6. Skin/Wound Care: routine pressure relief measures. Elevate RLE when seated.  7. Fluids/Electrolytes/Nutrition: Monitor I/O.   BMP within acceptable range on 10/3 8. Dyslipidemia: Continue lipitor.  9. HTN: Will resume HCTZ if indicated and will likely need Kdur for supplement.   Controlled 10/4 10. ABLA:   Hb 11.1 on 10/3  Cont to monitor 11. Constipation  Bowel reg increased on 10/3 12. 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  Podiatrist to perform on Friday per daughter   LOS (Days) 2 A FACE TO FACE EVALUATION WAS PERFORMED  Soo Steelman Lorie Phenix 05/08/2017 8:08 AM

## 2017-05-08 NOTE — Progress Notes (Signed)
Physical Therapy Session Note  Patient Details  Name: Kelly Lowe MRN: [id number] Date of Birth: [birth year]  Today's Date: 05/08/2017 PT Individual Time: 0802-0900 PT Individual Time Calculation (min): 58 min   Short Term Goals: Week 1:  PT Short Term Goal 1 (Week 1): Pt will transfer bed<>chair w/ Mod A  PT Short Term Goal 2 (Week 1): Pt will perform sit<>stand w/ Min A  PT Short Term Goal 3 (Week 1): Pt will ambulate 15' w/ LRAD PT Short Term Goal 4 (Week 1): Pt will maintain static standing balance w/ Min A w/o UE support PT Short Term Goal 5 (Week 1): Pt will perform bed mobility w/ supervision  Skilled Therapeutic Interventions/Progress Updates:   Pt supine upon arrival and agreeable to therapy, no c/o pain. Daughter present throughout session and appropriately assisting pt as needed w/ set-up assist. Pt transferred to EOB w/ Min A and multimodal cues for technique. Donned house coat at EOB w/ Mod A and transferred to w/c via stand pivot Max A to R side. Pt requesting to use toilet at this time and transferred to/from toilet via stedy. She performed multiple sit<>stand transfers in stedy w/ Min-Max A due to height of toilet and PT provided set-up A for pericare. Pt w/ increased L lean and LOB to L side in standing, Max A to correct and verbal cues for posture. Returned to w/c and pt self-propelled w/c to sink and daughter assisted pt in hygiene at sink w/ set-up assist. Pt performing tasks w/ RUE, encouraged use of LUE as able. Pt continued to work on self-propelling w/c towards therapy gym w/ Mod A using R hemi technique. Self-propelled 72' each way to/from gym using this technique. Worked on [employer] in gym while in standing performing multiple sit<>stands at high/low table w/ Min-Mod A and verbal cues for technique. Performed lateral weight shifts in standing w/ Min A to block L knee from buckling. Returned to room w/ transferred to recliner. Ended session in recliner and in care of  daughter, all needs met. Educated pt and daughter on importance of weight-bearing through LLE even while seated to promote increased proprioception, both in agreement.   Therapy Documentation Precautions:  Precautions Precautions: Fall Precaution Comments: R gaze preference Restrictions Weight Bearing Restrictions: No RLE Weight Bearing: Weight bearing as tolerated (per patient) Other Position/Activity Restrictions: Must be in boot for any weight bearing Vital Signs: Therapy Vitals Temp: 98.3 F (36.8 C) Temp Source: Oral Pulse Rate: (!) 59 Resp: 18 BP: (!) 133/59 Patient Position (if appropriate): Lying Oxygen Therapy SpO2: 96 % O2 Device: Not Delivered  See Function Navigator for Current Functional Status.   Therapy/Group: Individual Therapy  Jathen Sudano K Arnette 05/08/2017, 9:01 AM

## 2017-05-09 ENCOUNTER — Inpatient Hospital Stay (HOSPITAL_COMMUNITY): Payer: Medicare Other | Admitting: Physical Therapy

## 2017-05-09 ENCOUNTER — Encounter (HOSPITAL_COMMUNITY): Payer: Self-pay | Admitting: Cardiology

## 2017-05-09 ENCOUNTER — Inpatient Hospital Stay (HOSPITAL_COMMUNITY): Payer: Medicare Other | Admitting: Occupational Therapy

## 2017-05-09 ENCOUNTER — Inpatient Hospital Stay (HOSPITAL_COMMUNITY): Payer: 59 | Admitting: Occupational Therapy

## 2017-05-09 ENCOUNTER — Ambulatory Visit (HOSPITAL_COMMUNITY): Payer: Medicare Other | Admitting: Speech Pathology

## 2017-05-09 DIAGNOSIS — S92901S Unspecified fracture of right foot, sequela: Secondary | ICD-10-CM

## 2017-05-09 DIAGNOSIS — R000 Tachycardia, unspecified: Secondary | ICD-10-CM

## 2017-05-09 DIAGNOSIS — R0682 Tachypnea, not elsewhere classified: Secondary | ICD-10-CM

## 2017-05-09 NOTE — Progress Notes (Signed)
Speech Language Pathology Daily Session Note  Patient Details  Name: Kelly Lowe MRN: [id number] Date of Birth: [birth year]  Today's Date: 05/09/2017 SLP Individual Time: 8466-5993 SLP Individual Time Calculation (min): 42 min  Short Term Goals: Week 1: SLP Short Term Goal 1 (Week 1): Pt will consume dys 3 textures and thin liquids with mod I use of swallowing precautions and minimal overt s/s of aspiration  SLP Short Term Goal 2 (Week 1): Pt will complete semi-complex tasks with min assist verbal cues for functional problem solving.   SLP Short Term Goal 3 (Week 1): Pt will selectively attned to tasks for 20 minutes with supervision verbal cues for redirection.   SLP Short Term Goal 4 (Week 1): Pt will consume therapeutic trials of regular textures with mod I use of swallowing precautions over 3 consecutive sessions prior to advancement.   SLP Short Term Goal 5 (Week 1): Pt will recognize and correct errors in the moment during semi-complex tasks with min assist verbal cues.    Skilled Therapeutic Interventions:  Pt was seen for skilled ST targeting cognitive goals.  SLP facilitated the session with medication management tasks to address goals for higher level attention and problem solving.  Pt recalled function of medications when named for 100% accuracy with mod I.  She needed min verbal cues for organization and problem solving due to decreased selective attention to task in the presence of multiple environmental distractions.  Discussed distraction management techniques with pt and family members present.  All questions were answered to their satisfaction at this time.  Pt was left in recliner with call bell within reach.  Continue per current plan of care.   Function:  Eating Eating                 Cognition Comprehension Comprehension assist level: Follows complex conversation/direction with extra time/assistive device  Expression   Expression assist level: Expresses  complex ideas: With extra time/assistive device  Social Interaction Social Interaction assist level: Interacts appropriately with others with medication or extra time (anti-anxiety, antidepressant).  Problem Solving Problem solving assist level: Solves basic 75 - 89% of the time/requires cueing 10 - 24% of the time  Memory Memory assist level: Recognizes or recalls 90% of the time/requires cueing < 10% of the time    Pain Pain Assessment Pain Assessment: No/denies pain  Therapy/Group: Individual Therapy  Zhyon Antenucci, Selinda Orion 05/09/2017, 12:16 PM

## 2017-05-09 NOTE — Progress Notes (Signed)
Occupational Therapy Session Note  Patient Details  Name: Kelly Lowe MRN: [id number] Date of Birth: Apr 27, 1951  Today's Date: 05/09/2017 OT Individual Time: 0930-1030 OT Individual Time Calculation (min): 60 min    Short Term Goals: Week 1:  OT Short Term Goal 1 (Week 1): Pt will complete UB bathing with min assist sitting unsupported.  OT Short Term Goal 2 (Week 1): Pt will complete LB bathing with mod assist sit to stand.  OT Short Term Goal 3 (Week 1): Pt will perform UB dressing with min assist for donning pullover shirt following hemidressing techniques. OT Short Term Goal 4 (Week 1): Pt will perform toilet transfers to drop arm commode squat pivot with min assist.  OT Short Term Goal 5 (Week 1): Pt will return demonstrate AAROM exercises for the LUE following handout with supervision.    Skilled Therapeutic Interventions/Progress Updates:      Pt seen for BADL retraining of toileting, bathing, and dressing with a focus on trunk control, postural awareness, adaptive techniques and LUE management.  Pt received in recliner and though she needed to toilet. Used Stedy with min A with sit to stand in stedy to transfer to [facility] over toilet (she then was not able to toilet) and then to tub bench in shower.  She used a long handled sponge for her feet and began to learn how to use to wash her R shoulder. She used adaptive technique to wash R hand from elbow to fingers.  As she was concentrating on R side bathing she leaned to left and needed several cues to correct. She was able to self correct without physical assist. She was able to cleanse her bottom by reaching forward through the open seat tub bench.  She was transferred back to recliner for dressing and training with hemi dressing techniques - min for shirt, max LB.   Pt c/o discomfort with hand splint and that it kept her up all night.  Adjusted hand splint to allow for more flexion of finger so there was not so much of a stretch on her  webspace.  Educated pt and family that the straps should not be pulled tight and that she had full PROM of wrist and fingers with no tightness so if the splint is disrupting her sleep, she should remove it. Pt resting in recliner with all needs met.  Therapy Documentation Precautions:  Precautions Precautions: Fall Precaution Comments: R gaze preference Restrictions Weight Bearing Restrictions: No RLE Weight Bearing: Weight bearing as tolerated (per patient) Other Position/Activity Restrictions: Must be in boot for any weight bearing Therapy Vitals Temp: 97.8 F (36.6 C) Temp Source: Oral Pulse Rate: 62 Resp: 18 BP: 131/72 Patient Position (if appropriate): Sitting Oxygen Therapy SpO2: 98 % O2 Device: Not Delivered Pain: Pain Assessment Pain Assessment: No/denies pain ADL:     See Function Navigator for Current Functional Status.   Therapy/Group: Individual Therapy  Clark Mills 05/09/2017, 12:34 PM

## 2017-05-09 NOTE — Progress Notes (Signed)
Physical Therapy Session Note  Patient Details  Name: Kelly Lowe MRN: [id number] Date of Birth: 10-Jun-1951  Today's Date: 05/09/2017 PT Individual Time: 1517-1630 PT Individual Time Calculation (min): 73 min   Short Term Goals: Week 1:  PT Short Term Goal 1 (Week 1): Pt will transfer bed<>chair w/ Mod A  PT Short Term Goal 2 (Week 1): Pt will perform sit<>stand w/ Min A  PT Short Term Goal 3 (Week 1): Pt will ambulate 15' w/ LRAD PT Short Term Goal 4 (Week 1): Pt will maintain static standing balance w/ Min A w/o UE support PT Short Term Goal 5 (Week 1): Pt will perform bed mobility w/ supervision  Skilled Therapeutic Interventions/Progress Updates:   Pt in recliner upon arrival and agreeable to therapy, no c/o pain. Pt noticeably more depressed this session and reported feeling down about her "slow progress". Spent time reassuring pt on her positive progress and educating her on typical progression w/ someone of her age and diagnosis. Pt responded well to this and seems to respond well to frequent encouragement on her status. Worked on standing NMR in parallel bars while in standing - lateral weight shifts w/ mod A to facilitate, standing L quad sets, and partial knee bends, Min guard to Mod A to block L knee throughout knee flexion movements. Additionally performed NuStep @ L1, 5 min x2 to work on reciprocal movement pattern. Mod A tactile/manual cues to maintain LLE alignment. Worked on gait training at rail in hallway w/ similar cues for weight shifting as in parallel bars. Mod A for LLE placement, however pt able to maintain adequate knee control in L single leg stance and performed about 50% of L swing limb advancement. Returned to room and pt requesting to use toilet, use stedy to transfer to/from toilet for time management while pt performed multiple sit<>stands in stedy for pericare, Max A to remain standing w/o UE support. Ended session in w/c, call bell within reach and all needs  met.    Therapy Documentation Precautions:  Precautions Precautions: Fall Precaution Comments: R gaze preference Restrictions Weight Bearing Restrictions: No RLE Weight Bearing: Partial weight bearing Other Position/Activity Restrictions: Must be in boot for any weight bearing Vital Signs: Therapy Vitals Temp: 98.4 F (36.9 C) Temp Source: Oral Pulse Rate: 68 Resp: 18 BP: 128/75 Patient Position (if appropriate): Sitting Oxygen Therapy SpO2: 98 % O2 Device: Not Delivered  See Function Navigator for Current Functional Status.   Therapy/Group: Individual Therapy  Saba Gomm K Arnette 05/09/2017, 5:11 PM

## 2017-05-09 NOTE — Progress Notes (Signed)
Occupational Therapy Session Note  Patient Details  Name: Kelly Lowe MRN: [id number] Date of Birth: 1950-12-22  Today's Date: 05/09/2017 OT Individual Time: 1400-1430 OT Individual Time Calculation (min): 30 min    Short Term Goals: Week 1:  OT Short Term Goal 1 (Week 1): Pt will complete UB bathing with min assist sitting unsupported.  OT Short Term Goal 2 (Week 1): Pt will complete LB bathing with mod assist sit to stand.  OT Short Term Goal 3 (Week 1): Pt will perform UB dressing with min assist for donning pullover shirt following hemidressing techniques. OT Short Term Goal 4 (Week 1): Pt will perform toilet transfers to drop arm commode squat pivot with min assist.  OT Short Term Goal 5 (Week 1): Pt will return demonstrate AAROM exercises for the LUE following handout with supervision.   Week 2:    Week 3:     Skilled Therapeutic Interventions/Progress Updates:    NMR with focus on activation of left UE movement with manual facilitation and with UE ranger. Pt able to illicit shoulder/ scapular movement and bicep by end of session with gravity eliminated position in sitting position in recliner. Daughter in room of session- left in recliner.   Therapy Documentation Precautions:  Precautions Precautions: Fall Precaution Comments: R gaze preference Restrictions Weight Bearing Restrictions: No RLE Weight Bearing: Partial weight bearing Other Position/Activity Restrictions: Must be in boot for any weight bearing Pain: Pain Assessment Pain Assessment: No/denies pain  See Function Navigator for Current Functional Status.   Therapy/Group: Individual Therapy  [name] [facility] 05/09/2017, 3:43 PM

## 2017-05-09 NOTE — Progress Notes (Signed)
[facility] [place] PHYSICAL MEDICINE & REHABILITATION     PROGRESS NOTE  Subjective/Complaints:  Pt seen sitting up in bed this AM.  She states she slept fairly overnight because her WHO was hurting her.  She also would like her IV taken out.  Family with questions about bracing.  Spoke with nursing regarding adjustment by orthotist for bracing and d/cing IV.   ROS: Denies CP, SOB, N/V/D.  Objective: Vital Signs: Blood pressure 133/65, pulse 73, temperature 99.3 F (37.4 C), temperature source Oral, resp. rate 17, height 5\' 7"  (1.702 m), weight 81.2 kg (179 lb), SpO2 95 %. Dg Foot Complete Right  Result Date: 05/07/2017 CLINICAL DATA:  Follow-up right fifth metatarsal fracture. EXAM: RIGHT FOOT COMPLETE - 3+ VIEW COMPARISON:  03/16/2017 right foot radiographs. FINDINGS: Stable mild malalignment of comminuted distal shaft fracture in the right fifth metatarsal with 3 mm medial displacement of the dominant distal fracture fragment, with evidence of interval healing including endosteal and periosteal callus and decreased visualization of the fracture lucency. No additional fracture. No dislocation. No suspicious focal osseous lesions. 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 Diffuse osteopenia. No radiopaque foreign body. IMPRESSION: Healing distal shaft fracture in the right fifth metatarsal with stable mild displacement. Diffuse osteopenia. Electronically Signed   By: Ilona Sorrel M.D.   On: 05/07/2017 20:47    Recent Labs  05/07/17 0829  WBC 6.2  HGB 11.1*  HCT 33.2*  PLT 174    Recent Labs  05/07/17 0829  NA 138  K 3.8  CL 107  GLUCOSE 116*  BUN 12  CREATININE 0.84  CALCIUM 9.3   CBG (last 3)   Recent Labs  05/06/17 0835  GLUCAP 86    Wt Readings from Last 3 Encounters:  05/06/17 81.2 kg (179 lb)  05/03/17 87.8 kg (193 lb 8 oz)    Physical Exam:  BP 133/65 (BP Location: Right Arm)   Pulse 73   Temp 99.3 F (37.4 C) (Oral)   Resp 17   Ht 5\' 7"   (1.702 m)   Wt 81.2 kg (179 lb)   SpO2 95%   BMI 28.04 kg/m  Constitutional: She appears well-developedand well-nourished. NAD. HENT: Normocephalicand atraumatic.  Eyes: EOMI. No discharge.  Cardiovascular: RRR. No JVD. Respiratory: No respiratory distress. She has no wheezes. GI: She exhibits no distension. BS+ Musculoskeletal: She exhibits no edemaor deformity.  Neurological:  Right gaze preference.  Motor: LUE: 1/5 shoulder abduction. 0/5 distally LLE: 2/5 HF, and 0/5 distally.  No increase in tone Mild dysarthria  Follows simple commands.  Fair insight and awareness. Skin: Skin is warm.  Psychiatric: Flat.  Assessment/Plan: 1. Functional deficits secondary to right basal ganglia infarct with recent right foot fracture which require 3+ hours per day of interdisciplinary therapy in a comprehensive inpatient rehab setting. Physiatrist is providing close team supervision and 24 hour management of active medical problems listed below. Physiatrist and rehab team continue to assess barriers to discharge/monitor patient progress toward functional and medical goals.  Function:  Bathing Bathing position   Position: Shower  Bathing parts Body parts bathed by patient: Left arm, Chest, Abdomen, Right upper leg, Left upper leg, Front perineal area Body parts bathed by helper: Right arm, Buttocks, Right lower leg, Left lower leg, Back  Bathing assist        Upper Body Dressing/Undressing Upper body dressing   What is the patient wearing?: Bra, Pull over shirt/dress Bra - Perfomed by patient: Thread/unthread right bra strap Bra - Perfomed by  helper: Thread/unthread left bra strap, Hook/unhook bra (pull down sports bra) Pull over shirt/dress - Perfomed by patient: Thread/unthread right sleeve, Put head through opening Pull over shirt/dress - Perfomed by helper: Thread/unthread left sleeve, Pull shirt over trunk        Upper body assist        Lower Body  Dressing/Undressing Lower body dressing   What is the patient wearing?: Pants, Underwear, Socks, Shoes   Underwear - Performed by helper: Thread/unthread right underwear leg, Thread/unthread left underwear leg, Pull underwear up/down Pants- Performed by patient: Thread/unthread right pants leg Pants- Performed by helper: Thread/unthread left pants leg, Pull pants up/down           Shoes - Performed by helper: Don/doff right shoe, Don/doff left shoe, Fasten right, Fasten left          Lower body assist        Toileting Toileting     Toileting steps completed by helper: Adjust clothing prior to toileting, Performs perineal hygiene, Adjust clothing after toileting Toileting Assistive Devices: Grab bar or rail  Toileting assist     Transfers Chair/bed transfer   Chair/bed transfer method: Stand pivot Chair/bed transfer assist level: Maximal assist (Pt 25 - 49%/lift and lower) Chair/bed transfer assistive device: Armrests     Locomotion Ambulation     Max distance: 6 ft Assist level: Maximal assist (Pt 25 - 49%)   Wheelchair   Type: Manual Max wheelchair distance: 77' Assist Level: Moderate assistance (Pt 50 - 74%)  Cognition Comprehension Comprehension assist level: Follows complex conversation/direction with extra time/assistive device  Expression Expression assist level: Expresses complex ideas: With extra time/assistive device  Social Interaction Social Interaction assist level: Interacts appropriately with others with medication or extra time (anti-anxiety, antidepressant).  Problem Solving Problem solving assist level: Solves basic 75 - 89% of the time/requires cueing 10 - 24% of the time  Memory Memory assist level: Recognizes or recalls 90% of the time/requires cueing < 10% of the time    Medical Problem List and Plan: 1. Left hemiparesis and functional deficitssecondary to right basal ganglia infarct with recent right foot fracture  Cont CIR  WHO/PRAFO,  will ask orthotist for adjustments with WHO  Fluoxetine started 10/5  Contacted Ortho, partial WB RLE. Xray reviewed, displacement of 5th digit, but healing. Await Ortho recs  TEE relatively unremarkable with mild AI and MR per report 2. DVT Prophylaxis/Anticoagulation: Pharmaceutical: Lovenox 3. Pain Management/Chronic low back pain: Ice /heat for back with ultram prn.  4. Mood: LCSW to follow for evaluation and support.  5. Neuropsych: This patient iscapable of making decisions on herown behalf. 6. Skin/Wound Care: routine pressure relief measures. Elevate RLE when seated.  7. Fluids/Electrolytes/Nutrition: Monitor I/O.   BMP within acceptable range on 10/3 8. Dyslipidemia: Continue lipitor.  9. HTN: Will resume HCTZ if indicated and will likely need Kdur for supplement.   Controlled 10/5 10. ABLA:   Hb 11.1 on 10/3  Cont to monitor 11. Constipation  Bowel reg increased on 10/3 12. 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  Podiatrist to perform on today per daughter 25. Tachypnea/Tachycardia  This AM per chart, normal on exam, otherwise unremarkable in chart.  Will recheck.  LOS (Days) 3 A FACE TO FACE EVALUATION WAS PERFORMED  Kelly Lowe Lorie Phenix 05/09/2017 8:22 AM

## 2017-05-10 ENCOUNTER — Inpatient Hospital Stay (HOSPITAL_COMMUNITY): Payer: 59 | Admitting: Occupational Therapy

## 2017-05-10 ENCOUNTER — Inpatient Hospital Stay (HOSPITAL_COMMUNITY): Payer: Medicare Other | Admitting: Physical Therapy

## 2017-05-10 ENCOUNTER — Inpatient Hospital Stay (HOSPITAL_COMMUNITY): Payer: 59 | Admitting: Speech Pathology

## 2017-05-10 NOTE — Progress Notes (Signed)
Kelly Lowe is a 66 y.o. female Jul 22, 1951 [id number]  Subjective: No complaints or concerns. Slept well. "Little sister" at side. Motivated and appreciative of therapy support  Objective: Vital signs in last 24 hours: Temp:  [98.4 F (36.9 C)] 98.4 F (36.9 C) (10/06 0500) Pulse Rate:  [68-72] 72 (10/06 0500) Resp:  [18] 18 (10/06 0500) BP: (123-128)/(71-75) 123/71 (10/06 0500) SpO2:  [96 %-98 %] 96 % (10/06 0500) Weight change:  Last BM Date: 05/09/17  Intake/Output from previous day: 10/05 0701 - 10/06 0700 In: 720 [P.O.:720] Out: -   Physical Exam General: No apparent distress   In BS chair, family at side Lungs: Normal effort. Lungs clear to auscultation, no crackles or wheezes. Cardiovascular: Regular rate and rhythm, no edema Neurological: No new neurological deficits; L HP and right gaze pref   Lab Results: BMET    Component Value Date/Time   NA 138 05/07/2017 0829   K 3.8 05/07/2017 0829   CL 107 05/07/2017 0829   CO2 21 (L) 05/07/2017 0829   GLUCOSE 116 (H) 05/07/2017 0829   BUN 12 05/07/2017 0829   CREATININE 0.84 05/07/2017 0829   CALCIUM 9.3 05/07/2017 0829   GFRNONAA >60 05/07/2017 0829   GFRAA >60 05/07/2017 0829   CBC    Component Value Date/Time   WBC 6.2 05/07/2017 0829   RBC 3.40 (L) 05/07/2017 0829   HGB 11.1 (L) 05/07/2017 0829   HCT 33.2 (L) 05/07/2017 0829   PLT 174 05/07/2017 0829   MCV 97.6 05/07/2017 0829   MCH 32.6 05/07/2017 0829   MCHC 33.4 05/07/2017 0829   RDW 12.4 05/07/2017 0829   LYMPHSABS 2.1 05/07/2017 0829   MONOABS 0.4 05/07/2017 0829   EOSABS 0.1 05/07/2017 0829   BASOSABS 0.0 05/07/2017 0829   CBG's (last 3):  No results for input(s): GLUCAP in the last 72 hours. LFT's Lab Results  Component Value Date   ALT 32 05/07/2017   AST 47 (H) 05/07/2017   ALKPHOS 45 05/07/2017   BILITOT 0.7 05/07/2017    Studies/Results: No results found.  Medications:  I have reviewed the patient's current  medications. Scheduled Medications: . aspirin  325 mg Oral Daily  . atorvastatin  40 mg Oral q1800  . diclofenac sodium  2 g Topical QID  . enoxaparin (LOVENOX) injection  40 mg Subcutaneous Daily  . FLUoxetine  10 mg Oral Daily  . multivitamin with minerals  1 tablet Oral Daily  . nicotine  14 mg Transdermal Daily  . protein supplement shake  2 oz Oral QID  . 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 L HP d/t R basal ganglia CVA - continue med mgmt and CIR support as ongoing 2. R foot fx (5th MT) related to prior injury - continue orthotic support and pain mgmt prn 3. HTN - monitor and titrate tx prn 4. Dyslipidemia - continue statin   Length of stay, days: 4  Rudie Sermons A. Asa Lente, MD 05/10/2017, 11:33 AM

## 2017-05-10 NOTE — Progress Notes (Signed)
Physical Therapy Session Note  Patient Details  Name: Kelly Lowe MRN: [id number] Date of Birth: 1951-03-21  Today's Date: 05/10/2017 PT Individual Time: 1000-1106 PT Individual Time Calculation (min): 66 min   Short Term Goals: Week 1:  PT Short Term Goal 1 (Week 1): Pt will transfer bed<>chair w/ Mod A  PT Short Term Goal 2 (Week 1): Pt will perform sit<>stand w/ Min A  PT Short Term Goal 3 (Week 1): Pt will ambulate 15' w/ LRAD PT Short Term Goal 4 (Week 1): Pt will maintain static standing balance w/ Min A w/o UE support PT Short Term Goal 5 (Week 1): Pt will perform bed mobility w/ supervision  Skilled Therapeutic Interventions/Progress Updates:   Pt in recliner upon arrival and agreeable to therapy, no c/o pain. Worked on standing NMR this session while in parallel bars w/ emphasis on pre-gait tasks - step forward/back, lateral and forward weight shifting, and stepping up to 3" step. Min guard to Mod A for dynamic/static standing balance w/ unilateral UE support. Max A for L step placement 2/2 adductor tone. Multiple seated rest breaks 2/2 fatigue and moderate increase in work of breathing that resolves w/ rest. Static L adductor and HS stretch while seated during rest breaks. Educated pt on tone and spasticity that is common for CVA and strategies to combat it. Will continue to monitor tone. Worked on w/c mobility while returning to room using hemi-technique w/ Min guard-Min A, 31' at a time before requiring rest. Pt requesting to toilet at end of session and used stedy to get chair to/from toilet w/ multiple sit<>stands in stedy w/ Min guard while performing pericare w/ set-up assist. Ended session in recliner and in care of sister, all needs met.   Therapy Documentation Precautions:  Precautions Precautions: Fall Precaution Comments: R gaze preference Restrictions Weight Bearing Restrictions: No RLE Weight Bearing: Partial weight bearing (camboot) Other Position/Activity  Restrictions: Must be in boot for any weight bearing  See Function Navigator for Current Functional Status.   Therapy/Group: Individual Therapy  [name] 05/10/2017, 12:34 PM

## 2017-05-10 NOTE — Progress Notes (Signed)
Occupational Therapy Session Note  Patient Details  Name: Kelly Lowe MRN: [id number] Date of Birth: Dec 31, 1950  Today's Date: 05/10/2017 OT Individual Time: 9924-2683 and 4196-2229 OT Individual Time Calculation (min): 72 min and 30 min   Short Term Goals: Week 1:  OT Short Term Goal 1 (Week 1): Pt will complete UB bathing with min assist sitting unsupported.  OT Short Term Goal 2 (Week 1): Pt will complete LB bathing with mod assist sit to stand.  OT Short Term Goal 3 (Week 1): Pt will perform UB dressing with min assist for donning pullover shirt following hemidressing techniques. OT Short Term Goal 4 (Week 1): Pt will perform toilet transfers to drop arm commode squat pivot with min assist.  OT Short Term Goal 5 (Week 1): Pt will return demonstrate AAROM exercises for the LUE following handout with supervision.    Skilled Therapeutic Interventions/Progress Updates:    1) Treatment session with focus on sitting balance, sit <> stand, and hemi-technique with bathing/dressing.  Pt received upright in recliner with sister present, reporting desire to shower.  Completed transfer with use of Stedy recliner > toilet > shower chair in room shower with min-mod assist, requiring increased lifting from lower surfaces.  Noted lean to Rt in unsupported sitting with pt requiring verbal cues to correct but no physical assist.  Educated on increased positioning of LUE when not in use to increase weight bearing or functional position.  Pt with good recall of hemi-technique with washing RUE, additional cues for washing Rt underarm.  Min cues for hemi-dressing with shirt this session due to decreased recall.  Pt left upright in recliner with sister present.  2) Treatment session with focus on LUE NMR and Lt visual scanning with use of UE hand skate on table top.  Therapist providing support for positioning and manual facilitation at elbow.  Pt able to illicit shoulder/scapular movement and bicep  activation during activity, utilized tapping to increase mobility.  Encouraged visual scanning to Lt to reach towards items on Lt while reaching with hand skate.  Remained upright in recliner with sister present.  Therapy Documentation Precautions:  Precautions Precautions: Fall Precaution Comments: R gaze preference Restrictions Weight Bearing Restrictions: No RLE Weight Bearing: Partial weight bearing (camboot) Other Position/Activity Restrictions: Must be in boot for any weight bearing General:   Vital Signs: Therapy Vitals Temp: 98.4 F (36.9 C) Temp Source: Oral Pulse Rate: 72 Resp: 18 BP: 123/71 Patient Position (if appropriate): Sitting Oxygen Therapy SpO2: 96 % O2 Device: Not Delivered Pain:  Pt with no c/o pain  See Function Navigator for Current Functional Status.   Therapy/Group: Individual Therapy  Simonne Come 05/10/2017, 8:43 AM

## 2017-05-10 NOTE — Progress Notes (Signed)
Speech Language Pathology Daily Session Note  Patient Details  Name: Kelly Lowe MRN: [id number] Date of Birth: 01-Nov-1950  Today's Date: 05/10/2017 SLP Individual Time: 0850-0920 SLP Individual Time Calculation (min): 30 min  Short Term Goals: Week 1: SLP Short Term Goal 1 (Week 1): Pt will consume dys 3 textures and thin liquids with mod I use of swallowing precautions and minimal overt s/s of aspiration  SLP Short Term Goal 2 (Week 1): Pt will complete semi-complex tasks with min assist verbal cues for functional problem solving.   SLP Short Term Goal 3 (Week 1): Pt will selectively attned to tasks for 20 minutes with supervision verbal cues for redirection.   SLP Short Term Goal 4 (Week 1): Pt will consume therapeutic trials of regular textures with mod I use of swallowing precautions over 3 consecutive sessions prior to advancement.   SLP Short Term Goal 5 (Week 1): Pt will recognize and correct errors in the moment during semi-complex tasks with min assist verbal cues.    Skilled Therapeutic Interventions: Skilled treatment session focused on cognitive and dysphagia goals. SLP facilitated session by providing skilled observation with trials of regular textures. Patient demonstrated efficient mastication and utilization of a liquid wash with every bite which reduced oral residue. Patient with overt cough X 1, suspect due to mixed consistencies. Recommend continued trials with SLP. Patient demonstrated selective attention to self-feeding in a mildly distracting environment for ~20 minutes with supervision verbal cues for redirection. Patient left upright in recliner with sister present. Continue with current plan of care.      Function:  Eating Eating   Modified Consistency Diet: No (with trials from SLP ) Eating Assist Level: Set up assist for;More than reasonable amount of time   Eating Set Up Assist For: Opening containers       Cognition Comprehension Comprehension assist  level: Follows complex conversation/direction with extra time/assistive device  Expression   Expression assist level: Expresses complex ideas: With extra time/assistive device  Social Interaction Social Interaction assist level: Interacts appropriately with others with medication or extra time (anti-anxiety, antidepressant).  Problem Solving Problem solving assist level: Solves basic 75 - 89% of the time/requires cueing 10 - 24% of the time  Memory Memory assist level: Recognizes or recalls 90% of the time/requires cueing < 10% of the time    Pain No/Denies Pain   Therapy/Group: Individual Therapy  Donique Hammonds 05/10/2017, 12:27 PM

## 2017-05-11 ENCOUNTER — Inpatient Hospital Stay (HOSPITAL_COMMUNITY): Payer: 59 | Admitting: Occupational Therapy

## 2017-05-11 NOTE — Progress Notes (Signed)
Occupational Therapy Session Note  Patient Details  Name: Kelly Lowe MRN: [id number] Date of Birth: 12/03/1950  Today's Date: 05/11/2017 OT Individual Time:  - 1000-1130  (90 min)      Short Term Goals: Week 1:  OT Short Term Goal 1 (Week 1): Pt will complete UB bathing with min assist sitting unsupported.  OT Short Term Goal 2 (Week 1): Pt will complete LB bathing with mod assist sit to stand.  OT Short Term Goal 3 (Week 1): Pt will perform UB dressing with min assist for donning pullover shirt following hemidressing techniques. OT Short Term Goal 4 (Week 1): Pt will perform toilet transfers to drop arm commode squat pivot with min assist.  OT Short Term Goal 5 (Week 1): Pt will return demonstrate AAROM exercises for the LUE following handout with supervision.    Skilled Therapeutic Interventions/Progress Updates:     Pt received upright in recliner  reporting desire to shower and use toilet.   Completed transfer with use of Stedy recliner > toilet > shower bench  In room shower with min to mod  assist with sit to stand and ppt pulling from bar with RUE.  Pt leaned with pelvic region to left and OT provided cues and assist to maintain midline control during activities of transfer and sitting and standing  balance.  Addressed toilet transfer, shower transfer, and recliner transfer with STEDY.  Did one sit to stand (without lift) with max assist.  Ppt doffed shirt with min assist and cues for reaching various areas.  She was total assist with LB dressing (sitting on TTB)  including cam boot on right foot and shoe on left.  Pt needed min assist for dynamic sitting balance while sitting on TTB due to with decreased foot placement on floor.   Pt used LH Sponge for LB bathing for legs.  Need mod assist for posterior peri area.    Donned bra with max assist and pullover shirt with mod assist.  .  Pt placed in recliner at end of session and increased fatigue after session.  Call [place], phone, and  tray placed in pt's reach.   Therapy Documentation Precautions:  Precautions Precautions: Fall Precaution Comments: R gaze preference Restrictions Weight Bearing Restrictions: No RLE Weight Bearing: Partial weight bearing (cam boot OOB) Other Position/Activity Restrictions: Must be in boot for any weight bearing  See Function Navigator for Current Functional Status.   Therapy/Group: Individual Therapy  Lisa Roca

## 2017-05-11 NOTE — Progress Notes (Signed)
Kelly Lowe is a 66 y.o. female [birth year] [id number]  Subjective: No complaints or concerns. Slept well. Family has gone to shower and will return in the afternoon. Motivated and appreciative of therapy support  Objective: Vital signs in last 24 hours: Temp:  [98.3 F (36.8 C)] 98.3 F (36.8 C) (10/07 0440) Pulse Rate:  [62] 62 (10/07 0440) Resp:  [16] 16 (10/07 0440) BP: (153)/(84) 153/84 (10/07 0440) SpO2:  [100 %] 100 % (10/07 0440) Weight change:  Last BM Date: 05/10/17  Intake/Output from previous day: 10/06 0701 - 10/07 0700 In: 12 [P.O.:780] Out: 2 [Urine:2]  Physical Exam General: No apparent distress   In BS chair Lungs: Normal effort. Lungs clear to auscultation, no crackles or wheezes. Cardiovascular: Regular rate and rhythm, no edema Neurological: No new neurological deficits; L HP and right gaze pref   Lab Results: BMET    Component Value Date/Time   NA 138 05/07/2017 0829   K 3.8 05/07/2017 0829   CL 107 05/07/2017 0829   CO2 21 (L) 05/07/2017 0829   GLUCOSE 116 (H) 05/07/2017 0829   BUN 12 05/07/2017 0829   CREATININE 0.84 05/07/2017 0829   CALCIUM 9.3 05/07/2017 0829   GFRNONAA >60 05/07/2017 0829   GFRAA >60 05/07/2017 0829   CBC    Component Value Date/Time   WBC 6.2 05/07/2017 0829   RBC 3.40 (L) 05/07/2017 0829   HGB 11.1 (L) 05/07/2017 0829   HCT 33.2 (L) 05/07/2017 0829   PLT 174 05/07/2017 0829   MCV 97.6 05/07/2017 0829   MCH 32.6 05/07/2017 0829   MCHC 33.4 05/07/2017 0829   RDW 12.4 05/07/2017 0829   LYMPHSABS 2.1 05/07/2017 0829   MONOABS 0.4 05/07/2017 0829   EOSABS 0.1 05/07/2017 0829   BASOSABS 0.0 05/07/2017 0829   CBG's (last 3):  No results for input(s): GLUCAP in the last 72 hours. LFT's Lab Results  Component Value Date   ALT 32 05/07/2017   AST 47 (H) 05/07/2017   ALKPHOS 45 05/07/2017   BILITOT 0.7 05/07/2017    Studies/Results: No results found.  Medications:  I have reviewed the patient's  current medications. Scheduled Medications: . aspirin  325 mg Oral Daily  . atorvastatin  40 mg Oral q1800  . diclofenac sodium  2 g Topical QID  . enoxaparin (LOVENOX) injection  40 mg Subcutaneous Daily  . FLUoxetine  10 mg Oral Daily  . multivitamin with minerals  1 tablet Oral Daily  . nicotine  14 mg Transdermal Daily  . protein supplement shake  2 oz Oral QID  . 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 L HP d/t R basal ganglia CVA - continue med mgmt and CIR support as ongoing 2. R foot fx (5th MT) related to prior injury - continue orthotic support and pain mgmt prn 3. HTN - monitor and titrate tx prn 4. Dyslipidemia - continue statin   Length of stay, days: 5  Evy Lutterman A. Asa Lente, MD 05/11/2017, 11:35 AM

## 2017-05-12 ENCOUNTER — Ambulatory Visit (HOSPITAL_COMMUNITY): Payer: Medicare Other | Admitting: Speech Pathology

## 2017-05-12 ENCOUNTER — Inpatient Hospital Stay (HOSPITAL_COMMUNITY): Payer: 59 | Admitting: Occupational Therapy

## 2017-05-12 ENCOUNTER — Inpatient Hospital Stay (HOSPITAL_COMMUNITY): Payer: Medicare Other

## 2017-05-12 DIAGNOSIS — G479 Sleep disorder, unspecified: Secondary | ICD-10-CM

## 2017-05-12 MED ORDER — NON FORMULARY: 0.1000 mg | Freq: Every day | Status: DC

## 2017-05-12 MED ORDER — MELATONIN 3 MG PO TABS
3.0000 mg | ORAL_TABLET | Freq: Every day | ORAL | Status: DC
  Administered 2017-05-12 – 2017-05-27 (×16): 3 mg via ORAL
  Filled 2017-05-12 (×17): qty 1

## 2017-05-12 NOTE — Progress Notes (Signed)
Speech Language Pathology Daily Session Note  Patient Details  Name: Kelly Lowe MRN: [id number] Date of Birth: June 03, 1951  Today's Date: 05/12/2017 SLP Individual Time: 1304-1400 SLP Individual Time Calculation (min): 56 min  Short Term Goals: Week 1: SLP Short Term Goal 1 (Week 1): Pt will consume dys 3 textures and thin liquids with mod I use of swallowing precautions and minimal overt s/s of aspiration  SLP Short Term Goal 2 (Week 1): Pt will complete semi-complex tasks with min assist verbal cues for functional problem solving.   SLP Short Term Goal 3 (Week 1): Pt will selectively attned to tasks for 20 minutes with supervision verbal cues for redirection.   SLP Short Term Goal 4 (Week 1): Pt will consume therapeutic trials of regular textures with mod I use of swallowing precautions over 3 consecutive sessions prior to advancement.   SLP Short Term Goal 5 (Week 1): Pt will recognize and correct errors in the moment during semi-complex tasks with min assist verbal cues.    Skilled Therapeutic Interventions:  Pt was seen for skilled ST targeting goals for cognition and dysphagia.  Pt consumed dys 3 textures and thin liquids on lunch meal tray with mod I use of swallowing precautions to clear residual solids from the oral cavity.  Pt demonstrated no overt s/s of aspiration with solids or liquids.  SLP also facilitated the session with a novel card game to address problem solving goals.  Pt was able to plan and execute a problem solving strategy with intermittent min assist verbal cues.  Pt was left in wheelchair with call bell within reach and daughter at bedside.  Continue per current plan of care.    Function:  Eating Eating   Modified Consistency Diet: Yes Eating Assist Level: Supervision or verbal cues;Set up assist for   Eating Set Up Assist For: Opening containers       Cognition Comprehension Comprehension assist level: Follows complex conversation/direction with extra  time/assistive device  Expression   Expression assist level: Expresses complex ideas: With extra time/assistive device  Social Interaction Social Interaction assist level: Interacts appropriately with others with medication or extra time (anti-anxiety, antidepressant).  Problem Solving Problem solving assist level: Solves basic 75 - 89% of the time/requires cueing 10 - 24% of the time  Memory Memory assist level: Recognizes or recalls 90% of the time/requires cueing < 10% of the time    Pain Pain Assessment Pain Assessment: No/denies pain  Therapy/Group: Individual Therapy  Aluna Whiston, Selinda Orion 05/12/2017, 2:02 PM

## 2017-05-12 NOTE — Progress Notes (Signed)
Physical Therapy Session Note  Patient Details  Name: Kelly Lowe MRN: [id number] Date of Birth: May 20, 1951  Today's Date: 05/12/2017 PT Individual Time: 1546-1700 PT Individual Time Calculation (min): 74 min   Short Term Goals: Week 1:  PT Short Term Goal 1 (Week 1): Pt will transfer bed<>chair w/ Mod A  PT Short Term Goal 2 (Week 1): Pt will perform sit<>stand w/ Min A  PT Short Term Goal 3 (Week 1): Pt will ambulate 15' w/ LRAD PT Short Term Goal 4 (Week 1): Pt will maintain static standing balance w/ Min A w/o UE support PT Short Term Goal 5 (Week 1): Pt will perform bed mobility w/ supervision  Skilled Therapeutic Interventions/Progress Updates:    Pt seated in w/c upon PT arrival, agreeable to therapy tx and denies pain this session, reports she did not sleep well last night. Pt transported to gym in w/c total assist. Pt performed sit<>stands x 4 with mod assist in parallel bars using R bar for UE support. In standing worked on balancing without UE support while keeping symmetric weight through LEs 30 sec x 3. Standing in parallel bars worked on pregait stepping forward/back in place x 5 each leg, requiring assist to advance L LE. Pt transferred from w/c<>mat squat pivot with mod assist, verbal cues for technique and sequencing. Pt transferred sitting>sidelying with min assist to lift L LE. Pt sidelying on mat performed AROM hip flexion/extension using powder board for L LE NMR. Therapist performed 2 x 30 sec hip flexor stretch with pt in sidelying. In hooklying pt performed 2 x 10 hip abduction with L LE for NMR. Pt performed PNF D2 LE extension contract relax x 10. Pt transferred supine to sitting with mod assist. From elevated mat pt performed 1 x 8 sit<>stands without UE support, focus on symmetric weightbearing and anterior weightshift, mod assist. Pt left in w/c at end of session with daughters present.   Therapy Documentation Precautions:  Precautions Precautions:  Fall Precaution Comments: R gaze preference Restrictions Weight Bearing Restrictions: No RLE Weight Bearing: Partial weight bearing (cam boot OOB) RLE Partial Weight Bearing Percentage or Pounds:  (weight only via heel) Other Position/Activity Restrictions: Must be in boot for any weight bearing   See Function Navigator for Current Functional Status.   Therapy/Group: Individual Therapy  Netta Corrigan, PT, DPT 05/12/2017, 1:02 PM

## 2017-05-12 NOTE — Progress Notes (Signed)
[facility]IC[facility] & REHABILITATION     PROGRESS NOTE  Subjective/Complaints:  Pt seen sitting up in bed this AM.  Daughter at bedside, numerous questions, pleasant.  Family reports difficulty sleeping at night due to chronic back pain, but otherwise doing well and had a good weekend.   ROS: Denies CP, SOB, N/V/D.  Objective: Vital Signs: Blood pressure 135/71, pulse 64, temperature 97.9 F (36.6 C), temperature source Oral, resp. rate 18, height 5\' 7"  (1.702 m), weight 81.2 kg (179 lb), SpO2 95 %. No results found. No results for input(s): WBC, HGB, HCT, PLT in the last 72 hours. No results for input(s): NA, K, CL, GLUCOSE, BUN, CREATININE, CALCIUM in the last 72 hours.  Invalid input(s): CO CBG (last 3)  No results for input(s): GLUCAP in the last 72 hours.  Wt Readings from Last 3 Encounters:  05/06/17 81.2 kg (179 lb)  05/03/17 87.8 kg (193 lb 8 oz)    Physical Exam:  BP 135/71 (BP Location: Right Arm)   Pulse 64   Temp 97.9 F (36.6 C) (Oral)   Resp 18   Ht 5\' 7"  (1.702 m)   Wt 81.2 kg (179 lb)   SpO2 95%   BMI 28.04 kg/m  Constitutional: She appears well-developedand well-nourished. NAD. HENT: Normocephalicand atraumatic.  Eyes: EOMI. No discharge.  Cardiovascular: RRR. No JVD. Respiratory: No respiratory distress. She has no wheezes. GI: She exhibits no distension. BS+ Musculoskeletal: She exhibits no edemaor deformity.  Neurological:  Right gaze preference.  Motor: LUE: 1/5 shoulder abduction, 0/5 distally (stable) LLE: 2/5 HF, and 0/5 distally.  No increase in tone Mild dysarthria  Follows simple commands.  Insight and awareness into deficits. Skin: Skin is warm.  Psychiatric: Tearful  Assessment/Plan: 1. Functional deficits secondary to right basal ganglia infarct with recent right foot fracture which require 3+ hours per day of interdisciplinary therapy in a comprehensive inpatient rehab setting. Physiatrist is providing close team  supervision and 24 hour management of active medical problems listed below. Physiatrist and rehab team continue to assess barriers to discharge/monitor patient progress toward functional and medical goals.  Function:  Bathing Bathing position   Position: Shower  Bathing parts Body parts bathed by patient: Left arm, Chest, Abdomen, Right upper leg, Left upper leg, Front perineal area, Right lower leg, Left lower leg Body parts bathed by helper: Back, Right arm, Buttocks  Bathing assist Assist Level: Touching or steadying assistance(Pt > 75%)      Upper Body Dressing/Undressing Upper body dressing   What is the patient wearing?: Bra, Pull over shirt/dress Bra - Perfomed by patient: Thread/unthread right bra strap Bra - Perfomed by helper: Thread/unthread left bra strap, Hook/unhook bra (pull down sports bra) Pull over shirt/dress - Perfomed by patient: Thread/unthread right sleeve, Put head through opening Pull over shirt/dress - Perfomed by helper: Thread/unthread left sleeve, Pull shirt over trunk        Upper body assist Assist Level:  (Max assist)      Lower Body Dressing/Undressing Lower body dressing   What is the patient wearing?: Pants, Socks, Shoes, Non-skid slipper socks, Underwear (cam boot) Underwear - Performed by patient: Thread/unthread right underwear leg Underwear - Performed by helper: Thread/unthread left underwear leg, Pull underwear up/down Pants- Performed by patient: Thread/unthread right pants leg Pants- Performed by helper: Thread/unthread left pants leg, Pull pants up/down   Non-skid slipper socks- Performed by helper: Don/doff right sock       Shoes - Performed by helper: Don/doff left  shoe, Don/doff right shoe (Right Cam boot)   AFO - Performed by helper: Don/doff right AFO (Right CAM Boot)      Lower body assist Assist for lower body dressing: Touching or steadying assistance (Pt > 75%)      Toileting Toileting   Toileting steps completed by  patient: Performs perineal hygiene Toileting steps completed by helper: Adjust clothing prior to toileting, Performs perineal hygiene, Adjust clothing after toileting Toileting Assistive Devices: Grab bar or rail  Toileting assist Assist level: Touching or steadying assistance (Pt.75%)   Transfers Chair/bed transfer   Chair/bed transfer method: Stand pivot Chair/bed transfer assist level: Maximal assist (Pt 25 - 49%/lift and lower) Chair/bed transfer assistive device: Other (STEDY)     Locomotion Ambulation     Max distance: 8' Assist level: Moderate assist (Pt 50 - 74%)   Wheelchair   Type: Manual Max wheelchair distance: 54' Assist Level: Touching or steadying assistance (Pt > 75%)  Cognition Comprehension Comprehension assist level: Follows basic conversation/direction with no assist  Expression Expression assist level: Expresses complex 90% of the time/cues < 10% of the time  Social Interaction Social Interaction assist level: Interacts appropriately with others with medication or extra time (anti-anxiety, antidepressant).  Problem Solving Problem solving assist level: Solves basic 75 - 89% of the time/requires cueing 10 - 24% of the time  Memory Memory assist level: Recognizes or recalls 90% of the time/requires cueing < 10% of the time    Medical Problem List and Plan: 1. Left hemiparesis and functional deficitssecondary to right basal ganglia infarct with recent right foot fracture  Cont CIR  WHO/PRAFO better fitting  Fluoxetine started 10/5  Contacted Ortho, partial WB RLE. Xray reviewed, displacement of 5th digit, but healing. Await Ortho recs, will follow up again  TEE relatively unremarkable with mild AI and MR per report 2. DVT Prophylaxis/Anticoagulation: Pharmaceutical: Lovenox 3. Pain Management/Chronic low back pain: Ice /heat for back with ultram prn.  4. Mood: LCSW to follow for evaluation and support.  5. Neuropsych: This patient iscapable of making  decisions on herown behalf. 6. Skin/Wound Care: routine pressure relief measures. Elevate RLE when seated.  7. Fluids/Electrolytes/Nutrition: Monitor I/O.   BMP within acceptable range on 10/3 8. Dyslipidemia: Continue lipitor.  9. HTN: Will resume HCTZ if indicated and will likely need Kdur for supplement.   Controlled 10/8 10. ABLA:   Hb 11.1 on 10/3  Cont to monitor 11. Constipation  Bowel reg increased on 10/3 12. 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  Podiatrist to perform per daughter 76. Tachypnea/Tachycardia  Resolved 14. Sleep disturbance  Melatonin ordered 10/8  >35 minutes spent with patient and daughter with >30 minutes in discussion RLE boot, sleep, chronic pain, meds, etc.  LOS (Days) 6 A FACE TO FACE EVALUATION WAS PERFORMED  Nyilah Kight Lorie Phenix 05/12/2017 8:21 AM

## 2017-05-12 NOTE — Progress Notes (Signed)
Occupational Therapy Session Note  Patient Details  Name: Kelly Lowe MRN: [id number] Date of Birth: Jul 26, 1951  Today's Date: 05/12/2017 OT Individual Time: 0488-8916 OT Individual Time Calculation (min): 60 min    Short Term Goals: Week 1:  OT Short Term Goal 1 (Week 1): Pt will complete UB bathing with min assist sitting unsupported.  OT Short Term Goal 2 (Week 1): Pt will complete LB bathing with mod assist sit to stand.  OT Short Term Goal 3 (Week 1): Pt will perform UB dressing with min assist for donning pullover shirt following hemidressing techniques. OT Short Term Goal 4 (Week 1): Pt will perform toilet transfers to drop arm commode squat pivot with min assist.  OT Short Term Goal 5 (Week 1): Pt will return demonstrate AAROM exercises for the LUE following handout with supervision.    Skilled Therapeutic Interventions/Progress Updates:    Pt seen this session for self care training with a focus on trunk control. Pt stated that she had not slept well last night and was not up for a shower. She did want to sponge bathe at sink. While her NT was still in the room for a 2nd person for safety, pt practiced a squat pivot to the R with max A but was not able to fully pivot onto the seat so the NT assisted with pivoting hips further.  Pt then completed a second scoot into chair with mod A. She did well with forward wt shift and lean.    During B/D at sink, pt worked on sit to stand at sink with min A and worked on Location manager for 2-3 minutes with min A.  Used cross leg method for pt to don clothes over L foot with min A.  After self care was completed, pt taken to gym to work on PROM facilitation followed by tapping for stimulation.  Slight elbow flexion enhanced due to tone and trace sh flex/ ext. Pt opted to stay in w/c in room, lap tray placed, all needs met.  Therapy Documentation Precautions:  Precautions Precautions: Fall Precaution Comments: R gaze  preference Restrictions Weight Bearing Restrictions: No RLE Weight Bearing: Partial weight bearing (cam boot OOB) RLE Partial Weight Bearing Percentage or Pounds:  (weight only via heel) Other Position/Activity Restrictions: Must be in boot for any weight bearing   Pain: Pain Assessment Pain Assessment: 0-10 Pain Score: 3  Pain Type: Acute pain Pain Location: Neck Pain Orientation: Posterior Pain Descriptors / Indicators: Aching Pain Frequency: Intermittent Pain Onset: Gradual Pain Intervention(s): Medication (See eMAR);Heat applied    ADL:  See Function Navigator for Current Functional Status.   Therapy/Group: Individual Therapy  [place] 05/12/2017, 8:31 AM

## 2017-05-13 ENCOUNTER — Inpatient Hospital Stay (HOSPITAL_COMMUNITY): Payer: Medicare Other | Admitting: Physical Therapy

## 2017-05-13 ENCOUNTER — Ambulatory Visit (HOSPITAL_COMMUNITY): Payer: Medicare Other | Admitting: Speech Pathology

## 2017-05-13 ENCOUNTER — Inpatient Hospital Stay (HOSPITAL_COMMUNITY): Payer: 59 | Admitting: Occupational Therapy

## 2017-05-13 ENCOUNTER — Inpatient Hospital Stay (HOSPITAL_COMMUNITY): Payer: Medicare Other

## 2017-05-13 DIAGNOSIS — I69319 Unspecified symptoms and signs involving cognitive functions following cerebral infarction: Secondary | ICD-10-CM

## 2017-05-13 DIAGNOSIS — G8194 Hemiplegia, unspecified affecting left nondominant side: Secondary | ICD-10-CM

## 2017-05-13 LAB — BASIC METABOLIC PANEL
ANION GAP: 9 (ref 5–15)
BUN: 11 mg/dL (ref 6–20)
CALCIUM: 9.8 mg/dL (ref 8.9–10.3)
CHLORIDE: 104 mmol/L (ref 101–111)
CO2: 26 mmol/L (ref 22–32)
Creatinine, Ser: 0.83 mg/dL (ref 0.44–1.00)
GFR calc non Af Amer: >60 mL/min (ref >60)
Glucose, Bld: 93 mg/dL (ref 65–99)
Potassium: 4 mmol/L (ref 3.5–5.1)
Sodium: 139 mmol/L (ref 135–145)

## 2017-05-13 LAB — CBC
HCT: 35 % — ABNORMAL LOW (ref 36.0–46.0)
HEMOGLOBIN: 11.4 g/dL — AB (ref 12.0–15.0)
MCH: 32.3 pg (ref 26.0–34.0)
MCHC: 32.6 g/dL (ref 30.0–36.0)
MCV: 99.2 fL (ref 78.0–100.0)
Platelets: 165 10*3/uL (ref 150–400)
RBC: 3.53 MIL/uL — AB (ref 3.87–5.11)
RDW: 12.6 % (ref 11.5–15.5)
WBC: 6.3 10*3/uL (ref 4.0–10.5)

## 2017-05-13 NOTE — Progress Notes (Signed)
Physical Therapy Session Note  Patient Details  Name: Kelly Lowe MRN: [id number] Date of Birth: 01-Mar-1951  Today's Date: 05/13/2017 PT Individual Time: 1100-1159 PT Individual Time Calculation (min): 59 min   Short Term Goals: Week 1:  PT Short Term Goal 1 (Week 1): Pt will transfer bed<>chair w/ Mod A  PT Short Term Goal 2 (Week 1): Pt will perform sit<>stand w/ Min A  PT Short Term Goal 3 (Week 1): Pt will ambulate 15' w/ LRAD PT Short Term Goal 4 (Week 1): Pt will maintain static standing balance w/ Min A w/o UE support PT Short Term Goal 5 (Week 1): Pt will perform bed mobility w/ supervision  Skilled Therapeutic Interventions/Progress Updates:    Pt seated in w/c upon PT arrival, agreeable to therapy tx and denies pain. Pt transported to gym total assist. Pt performed mass practice of squat pivot transfers x 5 from w/c<>mat with mod assist, verbal cues for anterior weightshift and sequencing. Therapist donned L AFO to promote foot clearance with gait. Pt performed pre-gait using parallel bar for R UE support, stepping forward/backward with each leg x 5 with min assist. Pt ambulated x 10 ft and x 20 ft using R handrail for UE support, L AFO and mod assist in order to facilitate prevention of scissoring with swing phase of L LE. Pt propelled w/c x 100 ft back to room using R hemi technique, verbal cues and min assist for steering and technique. Pt left seated in w/c with needs in reach.   Therapy Documentation Precautions:  Precautions Precautions: Fall Precaution Comments: R gaze preference Restrictions Weight Bearing Restrictions: No RLE Weight Bearing: Partial weight bearing (cam boot OOB) RLE Partial Weight Bearing Percentage or Pounds:  (weight only via heel) Other Position/Activity Restrictions: Must be in boot for any weight bearing   See Function Navigator for Current Functional Status.   Therapy/Group: Individual Therapy  Netta Corrigan, PT, DPT 05/13/2017,  12:03 PM

## 2017-05-13 NOTE — Evaluation (Signed)
Recreational Therapy Assessment and Plan  Patient Details  Name: Kelly Lowe MRN: [id number] Date of Birth: 13-Dec-1950 Today's Date: 05/13/2017  Rehab Potential: Good ELOS: 3 weeks   Assessment  Problem List:      Patient Active Problem List   Diagnosis Date Noted  . Benign essential HTN   . Slow transit constipation   . Embolic stroke of right basal ganglia ([place]) 05/06/2017  . Essential hypertension 05/05/2017  . Tobacco use 05/05/2017  . Cerebral infarction ([place])   . Pain   . Hypokalemia   . Acute blood loss anemia   . CVA (cerebral vascular accident) ([place]) 05/03/2017    Past Medical History:      Past Medical History:  Diagnosis Date  . Hypertension    Past Surgical History:       Past Surgical History:  Procedure Laterality Date  . FOOT SURGERY      Assessment & Plan Clinical Impression: Patient is a 66 y.o. year old female with recent admission to the hospital on 05/03/17 with left sided weakness, right gaze preference and left facial droop. History taken from chart review, patient, and sister. CT head reviewed, showing right basal gangial infarct. Per report, age indeterminate right caudate infarct and CTA head/neck revealed intracranial atherosclerosis involving R>L PCA.MRI brain done revealing Non-hemorrhagic infarct in right basal ganglia, punctate infarct in left splenium and moderate underlying small vessel disease.  Patient transferred to CIR on 05/06/2017 .    Pt presents with decreased activity tolerance, decreased functional mobility, decreased balance Limiting pt's independence with leisure/community pursuits.  Leisure History/Participation Premorbid leisure interest/current participation: Medical laboratory scientific officer - Building control surveyor - Doctor, hospital - Travel (Comment) (spending time with family/grandchildren) Expression Interests: Music (Comment) Other Leisure Interests: Cooking/Baking;Housework Leisure Participation Style: With  Family/Friends Awareness of Community Resources: Good-identify 3 post discharge leisure resources Psychosocial / Spiritual Stress Management: Good Patient agreeable to Pet Therapy: Yes Does patient have pets?: No Social interaction - Mood/Behavior: Cooperative Academic librarian Appropriate for Education?: Yes Recreational Therapy Orientation Orientation -Reviewed with patient: Available activity resources Strengths/Weaknesses Patient Strengths/Abilities: Willingness to participate;Active premorbidly Patient weaknesses: Physical limitations TR Patient demonstrates impairments in the following area(s): Endurance;Motor;Safety  Plan Rec Therapy Plan Is patient appropriate for Therapeutic Recreation?: Yes Rehab Potential: Good Treatment times per week: Min 1 TR session/group >25 minutes during LOS Estimated Length of Stay: 3 weeks TR Treatment/Interventions: Adaptive equipment instruction;1:1 session;Balance/vestibular training;Functional mobility training;Community reintegration;Group participation (Comment);Patient/family education;Therapeutic activities;Recreation/leisure participation;Therapeutic exercise  Recommendations for other services: None   Discharge Criteria: Patient will be discharged from TR if patient refuses treatment 3 consecutive times without medical reason.  If treatment goals not met, if there is a change in medical status, if patient makes no progress towards goals or if patient is discharged from hospital.  The above assessment, treatment plan, treatment alternatives and goals were discussed and mutually agreed upon: by patient  [place] 05/13/2017, 3:38 PM

## 2017-05-13 NOTE — Progress Notes (Signed)
[name] PHYSICAL MEDICINE & REHABILITATION     PROGRESS NOTE  Subjective/Complaints:  Pt seen sitting up in bed this Am.  Daughter states she slept well overnight with meds.  They are still awaiting k-pad.  Family with questions about disability.  ROS: Denies CP, SOB, N/V/D.  Objective: Vital Signs: Blood pressure (!) 130/57, pulse 67, temperature 98.1 F (36.7 C), temperature source Oral, resp. rate 18, height 5\' 7"  (1.702 m), weight 81.2 kg (179 lb), SpO2 96 %. No results found.  Recent Labs  05/13/17 0607  WBC 6.3  HGB 11.4*  HCT 35.0*  PLT 165    Recent Labs  05/13/17 0607  NA 139  K 4.0  CL 104  GLUCOSE 93  BUN 11  CREATININE 0.83  CALCIUM 9.8   CBG (last 3)  No results for input(s): GLUCAP in the last 72 hours.  Wt Readings from Last 3 Encounters:  05/06/17 81.2 kg (179 lb)  05/03/17 87.8 kg (193 lb 8 oz)    Physical Exam:  BP (!) 130/57 (BP Location: Left Arm)   Pulse 67   Temp 98.1 F (36.7 C) (Oral)   Resp 18   Ht 5\' 7"  (1.702 m)   Wt 81.2 kg (179 lb)   SpO2 96%   BMI 28.04 kg/m  Constitutional: She appears well-developedand well-nourished. NAD. HENT: Normocephalicand atraumatic.  Eyes: EOMI. No discharge.  Cardiovascular: RRR. No JVD. Respiratory: No respiratory distress. She has no wheezes. GI: She exhibits no distension. BS+ Musculoskeletal: She exhibits no edemaor deformity.  Neurological:  Right gaze preference, improving.  Motor: LUE: 1/5 shoulder abduction, 0/5 distally (stable) LLE: 2/5 HF, and 0/5 distally.  Increase in tone, elbow flexion, ankle plantarflexion Mild dysarthria  Follows simple commands.  Insight and awareness into deficits. Skin: Skin is warm.  Psychiatric: Normal mood and behavior  Assessment/Plan: 1. Functional deficits secondary to right basal ganglia infarct with recent right foot fracture which require 3+ hours per day of interdisciplinary therapy in a comprehensive inpatient rehab  setting. Physiatrist is providing close team supervision and 24 hour management of active medical problems listed below. Physiatrist and rehab team continue to assess barriers to discharge/monitor patient progress toward functional and medical goals.  Function:  Bathing Bathing position   Position: Wheelchair/chair at sink  Bathing parts Body parts bathed by patient: Left arm, Chest, Abdomen, Right upper leg, Left upper leg, Front perineal area, Right lower leg, Left lower leg Body parts bathed by helper: Back, Right arm, Buttocks  Bathing assist Assist Level: Touching or steadying assistance(Pt > 75%)      Upper Body Dressing/Undressing Upper body dressing   What is the patient wearing?: Bra, Pull over shirt/dress Bra - Perfomed by patient: Thread/unthread right bra strap Bra - Perfomed by helper: Thread/unthread left bra strap, Hook/unhook bra (pull down sports bra) Pull over shirt/dress - Perfomed by patient: Thread/unthread right sleeve, Put head through opening Pull over shirt/dress - Perfomed by helper: Thread/unthread left sleeve, Pull shirt over trunk        Upper body assist Assist Level:  (Max assist)      Lower Body Dressing/Undressing Lower body dressing   What is the patient wearing?: Pants, Socks, Shoes, Non-skid slipper socks, Underwear Underwear - Performed by patient: Thread/unthread right underwear leg Underwear - Performed by helper: Thread/unthread left underwear leg, Pull underwear up/down Pants- Performed by patient: Thread/unthread right pants leg Pants- Performed by helper: Thread/unthread left pants leg, Pull pants up/down   Non-skid slipper socks- Performed by  helper: Don/doff right sock       Shoes - Performed by helper: Don/doff left shoe, Don/doff right shoe   AFO - Performed by helper: Don/doff right AFO      Lower body assist Assist for lower body dressing: Touching or steadying assistance (Pt > 75%)      Toileting Toileting   Toileting  steps completed by patient: Performs perineal hygiene Toileting steps completed by helper: Adjust clothing prior to toileting, Adjust clothing after toileting Toileting Assistive Devices: Grab bar or rail  Toileting assist Assist level: Touching or steadying assistance (Pt.75%)   Transfers Chair/bed transfer   Chair/bed transfer method: Stand pivot Chair/bed transfer assist level: Maximal assist (Pt 25 - 49%/lift and lower) Chair/bed transfer assistive device: Other (STEDY)     Locomotion Ambulation     Max distance: 8' Assist level: Moderate assist (Pt 50 - 74%)   Wheelchair   Type: Manual Max wheelchair distance: 61' Assist Level: Touching or steadying assistance (Pt > 75%)  Cognition Comprehension Comprehension assist level: Follows complex conversation/direction with extra time/assistive device  Expression Expression assist level: Expresses complex ideas: With extra time/assistive device  Social Interaction Social Interaction assist level: Interacts appropriately with others with medication or extra time (anti-anxiety, antidepressant).  Problem Solving Problem solving assist level: Solves basic 75 - 89% of the time/requires cueing 10 - 24% of the time  Memory Memory assist level: Recognizes or recalls 90% of the time/requires cueing < 10% of the time    Medical Problem List and Plan: 1. Left hemiparesis and functional deficitssecondary to right basal ganglia infarct with recent right foot fracture  Cont CIR  WHO/PRAFO better fitting, encouraged complete compliance  Fluoxetine started 10/5  Contacted Ortho, partial WB RLE. Xray reviewed, displacement of 5th digit, but healing. Await Ortho recs, will follow up again, cont to await recs  TEE relatively unremarkable with mild AI and MR per report 2. DVT Prophylaxis/Anticoagulation: Pharmaceutical: Lovenox 3. Pain Management/Chronic low back pain: Ice /heat for back with ultram prn.  4. Mood: LCSW to follow for evaluation and  support.  5. Neuropsych: This patient iscapable of making decisions on herown behalf. 6. Skin/Wound Care: routine pressure relief measures. Elevate RLE when seated.  7. Fluids/Electrolytes/Nutrition: Monitor I/O.   BMP within normal range on 10/9 8. Dyslipidemia: Continue lipitor.  9. HTN: Will resume HCTZ if indicated and will likely need Kdur for supplement.   Controlled 10/9 10. ABLA:   Hb 11.4 on 10/9  Cont to monitor 11. Constipation  Bowel reg increased on 10/3 12. 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  Podiatrist to perform per daughter 8. Tachypnea/Tachycardia  Resolved 14. Sleep disturbance  Melatonin ordered 10/8  Improving  LOS (Days) 7 A FACE TO FACE EVALUATION WAS PERFORMED  Kelly Lowe Lorie Phenix 05/13/2017 7:50 AM

## 2017-05-13 NOTE — Progress Notes (Signed)
Discussed X ray with Elnita Maxwell Manson Passey PA. Patient cleared to [facility] with recommendations for follow up films in 2 weeks--at time time to decide if patient can come out of boot. Will order X rays for 10/19.

## 2017-05-13 NOTE — Progress Notes (Signed)
Physical Therapy Session Note  Patient Details  Name: Kelly Lowe MRN: [id number] Date of Birth: [birth year]  Today's Date: 05/13/2017 PT Individual Time: 1534-1600 PT Individual Time Calculation (min): 26 min   Short Term Goals: Week 1:  PT Short Term Goal 1 (Week 1): Pt will transfer bed<>chair w/ Mod A  PT Short Term Goal 2 (Week 1): Pt will perform sit<>stand w/ Min A  PT Short Term Goal 3 (Week 1): Pt will ambulate 15' w/ LRAD PT Short Term Goal 4 (Week 1): Pt will maintain static standing balance w/ Min A w/o UE support PT Short Term Goal 5 (Week 1): Pt will perform bed mobility w/ supervision  Skilled Therapeutic Interventions/Progress Updates:   Pt in recliner upon arrival and agreeable to therapy, no c/o pain. Worked on w/c mobility this session while self-propelling w/c to/from gym w/ supervision and multimodal cues for R hemi technique. Emphasized use of w/c for independence in mobility around room and potentially at discharge. Performed gait training in therapy gym w/ hemi-walker. Instructing pt on technique and providing Mod A for balance and LLE limb advancement and step placement. Returned to room and ended session in w/c and in care of daughter, all needs met.   Therapy Documentation Precautions:  Precautions Precautions: Fall Precaution Comments: R gaze preference Restrictions Weight Bearing Restrictions: No RLE Weight Bearing: Partial weight bearing (cam boot OOB) RLE Partial Weight Bearing Percentage or Pounds:  (weight only via heel) Other Position/Activity Restrictions: Must be in boot for any weight bearing Vital Signs: Therapy Vitals Temp: 98.4 F (36.9 C) Temp Source: Oral Pulse Rate: (!) 57 Resp: 18 BP: 139/71 Patient Position (if appropriate): Sitting Oxygen Therapy SpO2: 97 % O2 Device: Not Delivered Pain: Pain Assessment Pain Assessment: No/denies pain  See Function Navigator for Current Functional Status.   Therapy/Group: Individual  Therapy  [name] K Arnette 05/13/2017, 5:13 PM

## 2017-05-13 NOTE — Progress Notes (Signed)
Speech Language Pathology Daily Session Note  Patient Details  Name: Kelly Lowe MRN: [id number] Date of Birth: 07-02-1951  Today's Date: 05/13/2017 SLP Individual Time: 1305-1400 SLP Individual Time Calculation (min): 55 min  Short Term Goals: Week 1: SLP Short Term Goal 1 (Week 1): Pt will consume dys 3 textures and thin liquids with mod I use of swallowing precautions and minimal overt s/s of aspiration  SLP Short Term Goal 2 (Week 1): Pt will complete semi-complex tasks with min assist verbal cues for functional problem solving.   SLP Short Term Goal 3 (Week 1): Pt will selectively attned to tasks for 20 minutes with supervision verbal cues for redirection.   SLP Short Term Goal 4 (Week 1): Pt will consume therapeutic trials of regular textures with mod I use of swallowing precautions over 3 consecutive sessions prior to advancement.   SLP Short Term Goal 5 (Week 1): Pt will recognize and correct errors in the moment during semi-complex tasks with min assist verbal cues.    Skilled Therapeutic Interventions:  Pt was seen for skilled ST targeting cognitive and dysphagia goals.  Pt consumed a trial meal tray of regular textures and thin liquids with mod I use of swallowing precautions to clear residual solids from the oral cavity. Pt demonstrated no overt s/s of aspiration with liquids or advanced solids.  Recommend advancing pt to regular textures and thin liquids with intermittent supervision for use of swallowing precautions.  Therapist also facilitated the session with a novel card game to address selective attention to task and functional problem solving.  Pt generated word-picture associations with supervision question cues to facilitate mental flexibility.  Pt then selectively attended to task for it's duration (~15 minutes) with no cues needed for redirection.  Pt was left in wheelchair with call bell within reach.  Continue per current plan of care.    Function:  Eating Eating    Modified Consistency Diet: Yes Eating Assist Level: Supervision or verbal cues;Set up assist for           Cognition Comprehension Comprehension assist level: Follows complex conversation/direction with extra time/assistive device  Expression   Expression assist level: Expresses complex ideas: With extra time/assistive device  Social Interaction Social Interaction assist level: Interacts appropriately with others with medication or extra time (anti-anxiety, antidepressant).  Problem Solving Problem solving assist level: Solves basic 90% of the time/requires cueing < 10% of the time  Memory Memory assist level: Recognizes or recalls 90% of the time/requires cueing < 10% of the time    Pain Pain Assessment Pain Assessment: No/denies pain  Therapy/Group: Individual Therapy  Makynna Manocchio, Selinda Orion 05/13/2017, 2:02 PM

## 2017-05-13 NOTE — Progress Notes (Signed)
Occupational Therapy Session Note  Patient Details  Name: Kelly Lowe MRN: [id number] Date of Birth: [birth year]  Today's Date: 05/13/2017 OT Individual Time: 3875-6433 OT Individual Time Calculation (min): 60 min    Short Term Goals: Week 1:  OT Short Term Goal 1 (Week 1): Pt will complete UB bathing with min assist sitting unsupported.  OT Short Term Goal 2 (Week 1): Pt will complete LB bathing with mod assist sit to stand.  OT Short Term Goal 3 (Week 1): Pt will perform UB dressing with min assist for donning pullover shirt following hemidressing techniques. OT Short Term Goal 4 (Week 1): Pt will perform toilet transfers to drop arm commode squat pivot with min assist.  OT Short Term Goal 5 (Week 1): Pt will return demonstrate AAROM exercises for the LUE following handout with supervision.    Skilled Therapeutic Interventions/Progress Updates:    pt seen for ADL training with a focus on balance, sit to stand, LUE management.  Pt received in w/c and worked briefly on facilitation of LUE with guided movement in sh flex/ ext with trace response of movement. Was not able to elicit elb flexion.  She completed sit to stand from w/c to [place] with close S.  In stedy, pt practiced standing upright in midline for 2 min. She was then transferred to tub bench where she maintained her posture on bench, but then needed steadying A as she leaned forward to wash feet with long handled sponge.  She used long sponge to wash R shoulder with R hand. Pt was able to stand with grab bar and hold balance as therapist washed her buttocks. Pt transferred back to w/c via Stedy.  she crossed L ankle over R knee with A so she can don pants over L foot.  Placed sturdy arm chair to R side of w/c for pt to use for support in standing as therapist pulled pants over her hips.  She is continuing to demonstrate strong progress with her standing balance.  Pt adjusted in w/c with lap tray and all needs met.   Therapy  Documentation Precautions:  Precautions Precautions: Fall Precaution Comments: R gaze preference Restrictions Weight Bearing Restrictions: No RLE Weight Bearing: Partial weight bearing (cam boot OOB) RLE Partial Weight Bearing Percentage or Pounds:  (weight only via heel) Other Position/Activity Restrictions: Must be in boot for any weight bearing  Pain: Pain Assessment Pain Assessment: No/denies pain Pain Score: 0-No pain ADL:    See Function Navigator for Current Functional Status.   Therapy/Group: Individual Therapy  Krizia Flight 05/13/2017, 12:09 PM

## 2017-05-14 ENCOUNTER — Inpatient Hospital Stay (HOSPITAL_COMMUNITY): Payer: Medicare Other | Admitting: Speech Pathology

## 2017-05-14 ENCOUNTER — Inpatient Hospital Stay (HOSPITAL_COMMUNITY): Payer: Medicare Other

## 2017-05-14 ENCOUNTER — Inpatient Hospital Stay (HOSPITAL_COMMUNITY): Payer: 59 | Admitting: Occupational Therapy

## 2017-05-14 DIAGNOSIS — I69354 Hemiplegia and hemiparesis following cerebral infarction affecting left non-dominant side: Principal | ICD-10-CM

## 2017-05-14 DIAGNOSIS — I69319 Unspecified symptoms and signs involving cognitive functions following cerebral infarction: Secondary | ICD-10-CM

## 2017-05-14 DIAGNOSIS — S92302A Fracture of unspecified metatarsal bone(s), left foot, initial encounter for closed fracture: Secondary | ICD-10-CM

## 2017-05-14 DIAGNOSIS — S92352D Displaced fracture of fifth metatarsal bone, left foot, subsequent encounter for fracture with routine healing: Secondary | ICD-10-CM

## 2017-05-14 MED ORDER — BACLOFEN 5 MG HALF TABLET
5.0000 mg | ORAL_TABLET | Freq: Three times a day (TID) | ORAL | Status: DC
  Administered 2017-05-14 (×3): 5 mg via ORAL
  Filled 2017-05-14 (×4): qty 1

## 2017-05-14 NOTE — Progress Notes (Signed)
Occupational Therapy Weekly Progress Note  Patient Details  Name: Kelly Lowe MRN: [id number] Date of Birth: 1951-06-01  Beginning of progress report period: May 07, 2017 End of progress report period: May 14, 2017  Today's Date: 05/14/2017 OT Individual Time: 6060-0459 OT Individual Time Calculation (min): 60 min    Patient has met 4 of 5 short term goals.  Pt has been making excellent progress with her adaptive strategies for self care using hemi techniques. She has also improved with her trunk control so she now has stable sitting balance when sitting on the tub bench.  She does need to work on her weight shifting skills to enable her to reach more easily when cleansing her back side.  Pt has been working on self ROM of her LUE.  She has trace movement in shoulder but further movement has not been able to be elicited.  She is no longer demonstrating L inattention.  She is also progressing with her transfers to the the toilet and tub bench but is not yet at a min A level.    Patient continues to demonstrate the following deficits: abnormal tone and unbalanced muscle activation and decreased sitting balance, decreased standing balance, decreased postural control, hemiplegia and decreased balance strategies and therefore will continue to benefit from skilled OT intervention to enhance overall performance with BADL.  Patient progressing toward long term goals..  Continue plan of care.  OT Short Term Goals Week 1:  OT Short Term Goal 1 (Week 1): Pt will complete UB bathing with min assist sitting unsupported.  OT Short Term Goal 1 - Progress (Week 1): Met OT Short Term Goal 2 (Week 1): Pt will complete LB bathing with mod assist sit to stand.  OT Short Term Goal 2 - Progress (Week 1): Met OT Short Term Goal 3 (Week 1): Pt will perform UB dressing with min assist for donning pullover shirt following hemidressing techniques. OT Short Term Goal 3 - Progress (Week 1): Met OT Short  Term Goal 4 (Week 1): Pt will perform toilet transfers to drop arm commode squat pivot with min assist.  OT Short Term Goal 4 - Progress (Week 1): Progressing toward goal OT Short Term Goal 5 (Week 1): Pt will return demonstrate AAROM exercises for the LUE following handout with supervision.   OT Short Term Goal 5 - Progress (Week 1): Met Week 2:  OT Short Term Goal 1 (Week 2): Pt will complete toilet transfers with min A.   OT Short Term Goal 2 (Week 2): Pt will complete tub bench transfers with min A. OT Short Term Goal 3 (Week 2): Pt will maintain standing balance with steadying A while pulling pants over hips with min A. OT Short Term Goal 4 (Week 2): Pt will be able to wt shift to her L side with control to allow her to self cleanse post toileting with steadying A.  Skilled Therapeutic Interventions/Progress Updates:    Pt seen this session for ADL training with a focus on trunk control with wt shifting, squat pivot transfers and hemi dressing techniques.  Pt received in recliner and worked on forward wt shifts with a hip lift in preparation for squat pivot transfer to w/c.  Pt completed 3 small squat pivots to move from recliner to chair with cues to forward wt shift and lean upper trunk in opposite direction of hips.  Pt then completed transfer to tub bench using R hand on grab bar .  On bench she had difficulty  wt shifting to L and elevating R hip to be able to cleanse her bottom fully.   She is managing her LUE fairly well with bathing tasks. Returned to w/c for dressing. She continues to need cues with hemi dressing techniques but she is getting more efficient with the tasks.  Pt tolerated therapy well. Pt resting in w/c with lap tray with all needs met.    Therapy Documentation Precautions:  Precautions Precautions: Fall Precaution Comments: R gaze preference Restrictions Weight Bearing Restrictions: No RLE Weight Bearing: Partial weight bearing RLE Partial Weight Bearing Percentage  or Pounds:  (weight only via heel) Other Position/Activity Restrictions: Must be in boot for any weight bearing  Pain: Pain Assessment Pain Assessment: No/denies pain Pain Score: 3  Pain Type: Acute pain Pain Location: Back Pain Orientation: Mid;Upper Pain Descriptors / Indicators: Aching Pain Frequency: Intermittent Pain Onset: On-going Patients Stated Pain Goal: 2 Pain Intervention(s): Heat applied ADL:   See Function Navigator for Current Functional Status.   Therapy/Group: Individual Therapy  [place] 05/14/2017, 12:17 PM

## 2017-05-14 NOTE — Progress Notes (Signed)
Physical Therapy Weekly Progress Note  Patient Details  Name: Kelly Lowe MRN: [id number] Date of Birth: [birth year]  Beginning of progress report period: May 07, 2017 End of progress report period: May 14, 2017  Today's Date: 05/14/2017 PT Individual Time: 1432-1545, 1630-1700 PT Individual Time Calculation (min): 73 min , 30 min  Patient has met 4 of 5 short term goals.  Pt is progressing with all functional mobility. Pt is able to perform transfers with mod assist consistently. She has initiated gait training and stair training in therapy. Pt demonstrates increased LE adductor tone impacting her quality of gait. Pt wears a CAM boot from previous R foot fracture also impacting her dynamic standing balance and ability to ambulate. Overall pt demonstrates significant progress over the past week.   Patient continues to demonstrate the following deficits muscle weakness, impaired timing and sequencing, decreased coordination and decreased motor planning, decreased midline orientation and decreased attention to left and decreased sitting balance, decreased standing balance, decreased postural control, hemiplegia and decreased balance strategies and therefore will continue to benefit from skilled PT intervention to increase functional independence with mobility.  Patient progressing toward long term goals..  Continue plan of care.  PT Short Term Goals Week 1:  PT Short Term Goal 1 (Week 1): Pt will transfer bed<>chair w/ Mod A  PT Short Term Goal 1 - Progress (Week 1): Met PT Short Term Goal 2 (Week 1): Pt will perform sit<>stand w/ Min A  PT Short Term Goal 2 - Progress (Week 1): Met PT Short Term Goal 3 (Week 1): Pt will ambulate 15' w/ LRAD PT Short Term Goal 3 - Progress (Week 1): Met PT Short Term Goal 4 (Week 1): Pt will maintain static standing balance w/ Min A w/o UE support PT Short Term Goal 4 - Progress (Week 1): Met PT Short Term Goal 5 (Week 1): Pt will perform bed  mobility w/ supervision PT Short Term Goal 5 - Progress (Week 1): Progressing toward goal Week 2:  PT Short Term Goal 1 (Week 2): Pt will ambulate 50 ft with Mod A using LRAD PT Short Term Goal 2 (Week 2): Pt will ascend/descend 4 steps with mod assist PT Short Term Goal 3 (Week 2): Pt will perform bed<>chair transfers with min assist  Skilled Therapeutic Interventions/Progress Updates:    Session 1: Pt seated in w/c upon PT arrival, agreeable to therapy tx and denies pain. Pt transported to gym in w/c total assist. Pt ascended/descended 4 steps using single handrail and mod assist, verbal cues for technique and sequencing, AFO for toe clearance. Pt ambulated x 50 ft using R handrail and mod assist, and x 30 ft with RW and hand orthosis with mod assist, verbal cues for sequencing, lateral weightshift and knee extension during stance. Pt transferred from w/c>mat with mod assist, squat pivot transfer. Pt performed 1 x 10 sit<>stands with min assist and focus on anterior weightshift, given cue "noes over toes." Pt worked on dynamic standing balance with focus on symmetric weightbearing in order to perform card matching with R UE, x 2 trials and min assist to maintain balance. Pt performed stand pivot transfer from mat>w/c with RW and hand orthosis and mod assist. Pt left in w/c at end of session with needs in reach.   Session 2: Pt seated in w/c upon PT arrival, agreeable to therapy tx and denies pain. Pt propelled w/c x 100 ft with min assist using R hemi technique, verbal cues for steering. Pt performed  stand pivot transfers with RW from w/c<>nustep, verbal cues for sequencing and safety. Pt used the nustep on workload 2 for 10 minutes working on L LE neuro re-ed and reciprocal movement. Pt seated in w/c performed 2 x 10 hip flexion and 2 x 10 LAQ with the L LE for neuro re-ed. Pt left seated in w/c at end of session with needs in reach.   Therapy Documentation Precautions:  Precautions Precautions:  Fall Precaution Comments: R gaze preference Restrictions Weight Bearing Restrictions: No RLE Weight Bearing: Partial weight bearing (cam boot OOB) RLE Partial Weight Bearing Percentage or Pounds:  (weight only via heel) Other Position/Activity Restrictions: Must be in boot for any weight bearing   See Function Navigator for Current Functional Status.  Therapy/Group: Individual Therapy  Netta Corrigan, PT, DPT 05/14/2017, 7:54 AM

## 2017-05-14 NOTE — Consult Note (Signed)
Neuropsychological Consultation   Patient:   Kelly Lowe   DOB:   07/24/1951  MR Number:  [id number]  Location:  [facility] [address] [facility] [address] [id number] [place] [address] Dept: [place]: [phone]           Date of Service:   05/14/2017  Start Time:   1 PM End Time:   2 PM  Provider/Observer:  Ilean Skill, Psy.D.       Clinical Neuropsychologist       Billing Code/Service: [phone] Units  Chief Complaint:    Kelly Lowe is a 66 year old female with history of hypertension, right foot fractures.  Pt was admitted on 9/29/018 with left sided weakness, right gaze preference and left facial droop.  CT revealed right basal gangial infarct and MRI revealed moderate SVD as well.  Left hemiparesis and sensory deficits, right gaze preference, decreased awareness and reduced information processing speed was noted as well.  The patient has had improving cognitive functioning.    Reason for Service:  Nysia Dell was referred for a neuropsychological consultation due to coping issues and ongoing efforts for increasing adaptive skills.  Below is the HPI for the current admission.    HPI: Kelly Lowe a 66 y.o.femalewith history of HTN, right foot fractures 03/2017; who was admitted on 05/03/17 with left sided weakness, right gaze preference and left facial droop. History taken from chart review, patient, and sister. CT head reviewed, showing right basal gangial infarct. Per report, age indeterminate right caudate infarct and CTA head/neck revealed intracranial atherosclerosis involving R>L PCA.MRI brain done revealing Non-hemorrhagic infarct in right basal ganglia, punctate infarct in left splenium and moderate underlying small vessel disease.2 D echo done revealing EF 65-70% with no wall abnormality, mild MVR, TVR and PVR. Hip films done due to reports of fall and back pain and  showed DDD and no hip fracture or dislocation.   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 Dr. Leonie Man recommends ASA for embolic stroke due to unknown source and will need TEE/loop. TCD without hits or evidence of PFO. Patient with resultant deficits in mobility and self care tasks due to dense left hemiparesis with sensory deficits, right gaze preference, decrease awareness of deficits as well as delayed processing due to higher level deficits. CIR recommended for follow up therapy.   Current Status:  The patient had good mental status exam today and improving global cognitive functioning.  There were still issues of overall information processing speed but expressive and receptive language function was in tact  And short and long term memory.  Behavioral Observation: [name]  presents as a 66 y.o.-year-old Right African American Female who appeared her stated age. her dress was Appropriate and she was Well Groomed and her manners were Appropriate to the situation.  her participation was indicative of Appropriate and Attentive behaviors.  There were physical disabilities noted related to left hemiparesis.  Marland Kitchen  she displayed an appropriate level of cooperation and motivation.     Interactions:    Active Appropriate and Attentive  Attention:   within normal limits and attention span and concentration were age appropriate  Memory:   within normal limits; recent and remote memory intact  Visuo-spatial:  not examined  Speech (Volume):  low  Speech:   normal; normal  Thought Process:  Coherent and Relevant  Though Content:  WNL; not suicidal  Orientation:   person, place, time/date and situation  Judgment:   Good  Planning:   Good  Affect:    Anxious  Mood:    Anxious  Insight:   Good  Intelligence:   high  Substance Use:  No concerns of substance abuse are  reported.    Medical History:   Past Medical History:  Diagnosis Date  . Hypertension        Family Med/Psych History: History reviewed. No pertinent family history.  Risk of Suicide/Violence: virtually non-existent   Impression/DX:  Kelly Lowe is a 66 year old female with history of hypertension, right foot fractures.  Pt was admitted on 9/29/018 with left sided weakness, right gaze preference and left facial droop.  CT revealed right basal gangial infarct and MRI revealed moderate SVD as well.  Left hemiparesis and sensory deficits, right gaze preference, decreased awareness and reduced information processing speed was noted as well.  The patient has had improving cognitive functioning.    The patient had good mental status exam today and improving global cognitive functioning.  There were still issues of overall information processing speed but expressive and receptve language function was in tact  And short and long term memory.  The patient reports increased issues with anxiety and sudden crying events when she talks with family or coworkers.  The patient reports that she does not have these emotional responses out of the blue but when she talks to people and has to deal with what has happened to her.    Disposition/Plan:  Will see the patient again first of next week to continue work on issues related to anxeity and depressive responses.  Diagnosis:   Right basal ganglia infarction        Electronically Signed   _______________________ Ilean Skill, Psy.D.

## 2017-05-14 NOTE — Progress Notes (Signed)
Speech Language Pathology Discharge Summary  Patient Details  Name: Kelly Lowe MRN: [id number] Date of Birth: [birth year]  Today's Date: 05/14/2017 SLP Individual Time: 8127-5170 SLP Individual Time Calculation (min): 40 min   Skilled Therapeutic Interventions:  Pt was seen for skilled ST targeting cognitive goals.  Pt reports excellent toleration of diet upgrade to regular textures without any difficulty chewing or swallowing advanced solids.  SLP facilitated the session with a novel scheduling task to address higher level problem solving.  Pt was able to complete task with intermittent supervision verbal cues for abstract reasoning but was otherwise mod I for 100% accuracy.  Pt was able to identify how her current physical limitations will impact her functional independence in the home environment and generated at least 3 specific solutions to potential barriers.  Pt reports feeling back to baseline for mentation.  All education is completed at this time.  Recommend discharging from [facility] as pt is now mod I for complex cognitive tasks and for use of swallowing precautions with a regular diet.       Patient has met 6 of 6 long term goals.  Patient to discharge at overall Modified Independent level.  Reasons goals not met:     Clinical Impression/Discharge Summary:  Pt has made functional gains and is discharging from [facility] services having met 6 out of 6 long term goals.  Pt's diet has been upgraded to regular textures and thin liquids which she is consuming with mod I use of swallowing precautions to clear residual solids from the oral cavity.  Pt is also mod I for complex cognitive tasks and reports returning to baseline for mentation.  Pt and family education is complete at this time.  No further ST needs indicated.    Care Partner:  Caregiver Able to Provide Assistance: Yes  Type of Caregiver Assistance: Physical  Recommendation:  None      Equipment: none recommended by SLP     Reasons for discharge: Treatment goals met   Patient/Family Agrees with Progress Made and Goals Achieved: Yes   Function:  Eating Eating                 Cognition Comprehension Comprehension assist level: Follows complex conversation/direction with extra time/assistive device  Expression   Expression assist level: Expresses complex ideas: With extra time/assistive device  Social Interaction Social Interaction assist level: Interacts appropriately with others with medication or extra time (anti-anxiety, antidepressant).  Problem Solving Problem solving assist level: Solves complex problems: With extra time  Memory Memory assist level: More than reasonable amount of time   Emilio Math 05/14/2017, 12:18 PM

## 2017-05-14 NOTE — Progress Notes (Signed)
[facility] PHYSICAL MEDICINE & REHABILITATION     PROGRESS NOTE  Subjective/Complaints:  Pt seen returning from restroom.  She slept fairly overnight.  Daughter and son-in-law with several questions regarding boot, NMES, medications.   ROS: Denies CP, SOB, N/V/D.  Objective: Vital Signs: Blood pressure 140/66, pulse (!) 56, temperature 97.7 F (36.5 C), temperature source Oral, resp. rate 17, height 5\' 7"  (1.702 m), weight 81.2 kg (179 lb), SpO2 99 %. No results found.  Recent Labs  05/13/17 0607  WBC 6.3  HGB 11.4*  HCT 35.0*  PLT 165    Recent Labs  05/13/17 0607  NA 139  K 4.0  CL 104  GLUCOSE 93  BUN 11  CREATININE 0.83  CALCIUM 9.8   CBG (last 3)  No results for input(s): GLUCAP in the last 72 hours.  Wt Readings from Last 3 Encounters:  05/06/17 81.2 kg (179 lb)  05/03/17 87.8 kg (193 lb 8 oz)    Physical Exam:  BP 140/66 (BP Location: Right Leg)   Pulse (!) 56   Temp 97.7 F (36.5 C) (Oral)   Resp 17   Ht 5\' 7"  (1.702 m)   Wt 81.2 kg (179 lb)   SpO2 99%   BMI 28.04 kg/m  Constitutional: She appears well-developedand well-nourished. NAD. HENT: Normocephalicand atraumatic.  Eyes: EOMI. No discharge.  Cardiovascular: RRR. No JVD. Respiratory: No respiratory distress. She has no wheezes. GI: She exhibits no distension. BS+ Musculoskeletal: She exhibits no edemaor deformity.  Neurological:  Right gaze preference, improving.  Motor: LUE: 1/5 shoulder abduction, 0/5 distally (unchanged) LLE: 2/5 HF, and 0/5 distally.  Increase in tone, elbow flexion, ankle plantarflexion Mild dysarthria  Follows simple commands.  Insight and awareness into deficits. Skin: Skin is warm.  Psychiatric: Normal mood and behavior  Assessment/Plan: 1. Functional deficits secondary to right basal ganglia infarct with recent right foot fracture which require 3+ hours per day of interdisciplinary therapy in a comprehensive inpatient rehab setting. Physiatrist is  providing close team supervision and 24 hour management of active medical problems listed below. Physiatrist and rehab team continue to assess barriers to discharge/monitor patient progress toward functional and medical goals.  Function:  Bathing Bathing position   Position: Shower  Bathing parts Body parts bathed by patient: Left arm, Chest, Abdomen, Right upper leg, Left upper leg, Front perineal area, Right lower leg, Left lower leg, Right arm Body parts bathed by helper: Back, Right arm, Buttocks  Bathing assist Assist Level: Touching or steadying assistance(Pt > 75%)      Upper Body Dressing/Undressing Upper body dressing   What is the patient wearing?: Bra, Pull over shirt/dress Bra - Perfomed by patient: Thread/unthread right bra strap Bra - Perfomed by helper: Thread/unthread left bra strap, Hook/unhook bra (pull down sports bra) Pull over shirt/dress - Perfomed by patient: Thread/unthread right sleeve, Put head through opening, Pull shirt over trunk Pull over shirt/dress - Perfomed by helper: Thread/unthread left sleeve        Upper body assist Assist Level:  (Max assist)      Lower Body Dressing/Undressing Lower body dressing   What is the patient wearing?: Pants, Shoes, Non-skid slipper socks, Underwear Underwear - Performed by patient: Thread/unthread right underwear leg, Thread/unthread left underwear leg Underwear - Performed by helper: Pull underwear up/down Pants- Performed by patient: Thread/unthread right pants leg, Thread/unthread left pants leg Pants- Performed by helper: Pull pants up/down   Non-skid slipper socks- Performed by helper: Don/doff right sock  Shoes - Performed by helper: Don/doff left shoe   AFO - Performed by helper: Don/doff right AFO      Lower body assist Assist for lower body dressing: Touching or steadying assistance (Pt > 75%)      Toileting Toileting   Toileting steps completed by patient: Performs perineal  hygiene Toileting steps completed by helper: Adjust clothing prior to toileting, Adjust clothing after toileting Toileting Assistive Devices: Grab bar or rail  Toileting assist Assist level: Touching or steadying assistance (Pt.75%)   Transfers Chair/bed transfer   Chair/bed transfer method: Stand pivot Chair/bed transfer assist level: Moderate assist (Pt 50 - 74%/lift or lower) Chair/bed transfer assistive device: Armrests     Locomotion Ambulation     Max distance: 15' Assist level: Moderate assist (Pt 50 - 74%)   Wheelchair   Type: Manual Max wheelchair distance: 150' Assist Level: Supervision or verbal cues  Cognition Comprehension Comprehension assist level: Follows complex conversation/direction with extra time/assistive device  Expression Expression assist level: Expresses complex ideas: With extra time/assistive device  Social Interaction Social Interaction assist level: Interacts appropriately with others with medication or extra time (anti-anxiety, antidepressant).  Problem Solving Problem solving assist level: Solves basic 90% of the time/requires cueing < 10% of the time  Memory Memory assist level: Recognizes or recalls 90% of the time/requires cueing < 10% of the time    Medical Problem List and Plan: 1. Left hemiparesis and functional deficitssecondary to right basal ganglia infarct with recent right foot fracture  Cont CIR  WHO/PRAFO better fitting, encouraged complete compliance  Fluoxetine started 10/5  Contacted Ortho, partial WB RLE. Xray reviewed, displacement of 5th digit, but healing. Per Ortho WBAT in boot and repeat films ~10/19.   Baclofen 5 TID started 10/10  TEE relatively unremarkable with mild AI and MR per report 2. DVT Prophylaxis/Anticoagulation: Pharmaceutical: Lovenox 3. Pain Management/Chronic low back pain: Ice /heat for back with ultram prn.  4. Mood: LCSW to follow for evaluation and support.  5. Neuropsych: This patient iscapable of  making decisions on herown behalf. 6. Skin/Wound Care: routine pressure relief measures. Elevate RLE when seated.  7. Fluids/Electrolytes/Nutrition: Monitor I/O.   BMP within normal range on 10/9 8. Dyslipidemia: Continue lipitor.  9. HTN: Will resume HCTZ if indicated and will likely need Kdur for supplement.   Controlled 10/10 10. ABLA:   Hb 11.4 on 10/9  Cont to monitor 11. 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  Podiatrist to perform per daughter 29. Tachypnea/Tachycardia  Resolved 14. Sleep disturbance  Melatonin ordered 10/8  Improving  LOS (Days) 8 A FACE TO FACE EVALUATION WAS PERFORMED  Ankit Lorie Phenix 05/14/2017 8:05 AM

## 2017-05-15 ENCOUNTER — Inpatient Hospital Stay (HOSPITAL_COMMUNITY): Payer: 59 | Admitting: Occupational Therapy

## 2017-05-15 ENCOUNTER — Inpatient Hospital Stay (HOSPITAL_COMMUNITY): Payer: Medicare Other | Admitting: Physical Therapy

## 2017-05-15 DIAGNOSIS — N319 Neuromuscular dysfunction of bladder, unspecified: Secondary | ICD-10-CM

## 2017-05-15 MED ORDER — FLUOXETINE HCL 20 MG PO CAPS
20.0000 mg | ORAL_CAPSULE | Freq: Every day | ORAL | Status: DC
  Administered 2017-05-16 – 2017-05-27 (×12): 20 mg via ORAL
  Filled 2017-05-15 (×12): qty 1

## 2017-05-15 MED ORDER — TIZANIDINE HCL 2 MG PO TABS: 2.0000 mg | ORAL_TABLET | Freq: Three times a day (TID) | ORAL | Status: DC | PRN

## 2017-05-15 MED ORDER — BETHANECHOL CHLORIDE 10 MG PO TABS
5.0000 mg | ORAL_TABLET | Freq: Three times a day (TID) | ORAL | Status: DC
  Administered 2017-05-15 – 2017-05-19 (×13): 5 mg via ORAL
  Filled 2017-05-15 (×12): qty 1

## 2017-05-15 NOTE — Progress Notes (Signed)
Occupational Therapy Session Note  Patient Details  Name: Kelly Lowe MRN: [id number] Date of Birth: 11-Nov-1950  Today's Date: 05/15/2017 OT Individual Time: 5301-0404 OT Individual Time Calculation (min): 60 min    Short Term Goals: Week 2:  OT Short Term Goal 1 (Week 2): Pt will complete toilet transfers with min A.   OT Short Term Goal 2 (Week 2): Pt will complete tub bench transfers with min A. OT Short Term Goal 3 (Week 2): Pt will maintain standing balance with steadying A while pulling pants over hips with min A. OT Short Term Goal 4 (Week 2): Pt will be able to wt shift to her L side with control to allow her to self cleanse post toileting with steadying A.  Skilled Therapeutic Interventions/Progress Updates:    Pt stated that she was feeling very lethargic from her medicine she took last night and was having difficulty keeping her eyes open. Pt requested to only get dressed today.  She demonstrated improved balance with pulling pants up and down over hips needing min-mod A to stabilize balance.  Pt taken to gym and worked on forward scoots and squat pivots with emphasis on forward wt shift.  Pt positioned in supine for LUE PROM as she is developing bicep tightness.  Facilitation with placing and holding for elbow control, but no active movement felt.  Facilitation of shoulder with PNF patterns with pt demonstrating improved sh retraction strength.  Pt positioned in sitting with focus on sidelying to sit rolling technique.  Worked on L wt shift with R hip lift.  Pt had improved trunk control and was able to elevate R hip which will help her with bathing and toileting.  Pt transferred back to w.c and then back to recliner to rest.  Pt in room with all needs met.  Therapy Documentation Precautions:  Precautions Precautions: Fall Precaution Comments: R gaze preference Restrictions Weight Bearing Restrictions: No RLE Weight Bearing: Weight bearing as tolerated RLE Partial Weight  Bearing Percentage or Pounds:  (weight only via heel) Other Position/Activity Restrictions: Must be in boot for any weight bearing     Pain: Pain Assessment Pain Assessment: No/denies pain Pain Score: Asleep Pain Type: Acute pain Pain Location: Back Pain Orientation: Mid;Upper Pain Descriptors / Indicators: Aching Pain Frequency: Intermittent Pain Onset: On-going Patients Stated Pain Goal: 2 Pain Intervention(s): Medication (See eMAR)   ADL:   See Function Navigator for Current Functional Status.   Therapy/Group: Individual Therapy  [place] 05/15/2017, 12:33 PM

## 2017-05-15 NOTE — Progress Notes (Signed)
Social Work Patient ID: Kelly Lowe, female   DOB: 03-Apr-1951, 66 y.o.   MRN: [id number]  Met with pt, daughter and son in-law to discuss team conference goals min assist level and target discharge 10/24. Daughter having stair lifts and ramps estimated for her home for pt to access. She is hopeful Amelia Jo can be done prior to pt's discharge. Pt is being too hard on herself and expecting too much, she is aware of this. She is one to push herself hard to achieve her goals and has raised her children this way. Family member is here daily and provides support to pt. Will work on discharge needs and arrangements. Pleased with herself for Meeting speech goals and being discharged form so can focus more on PT and OT goals.

## 2017-05-15 NOTE — Patient Care Conference (Signed)
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 Fracture of metatarsal bone of left foot   . Hemiparesis of left nondominant side ([place]) 05/13/2017  . Cognitive deficits following cerebral infarction 05/13/2017  . Sleep disturbance   . Closed fracture of bone of right foot   . Benign essential HTN   . Slow transit constipation   . Embolic stroke of right basal ganglia ([place]) 05/06/2017  . Tobacco use 05/05/2017  . Cerebral infarction ([place])   . Pain   . Hypokalemia   . Acute blood loss anemia   . CVA (cerebral vascular accident) ([place]) 05/03/2017    Expected Discharge Date: Expected Discharge Date: 05/28/17  Team Members Present: Physician leading conference: Dr. Delice Lesch Social Worker Present: Ovidio Kin, LCSW Nurse Present: Leonette Nutting, RN PT Present: Michaelene Song, PT OT Present: Clyda Greener, OT SLP Present: Windell Moulding, SLP PPS Coordinator present : Daiva Nakayama, RN, CRRN     Current Status/Progress Goal Weekly Team Focus  Medical    Left hemiparesis and functional deficits secondary to right basal ganglia infarct with recent right foot fracture  Improve mobility, safety, sleep, tone  See above   Bowel/Bladder        Cont B & B     Swallow/Nutrition/ Hydration   regular thin liquids   mod I   goal met    ADL's   min A UB self care, mod A LB self care, mod  A squat pivot, min A sit to stand  min assist goals overall  ADL training, LUE NMR, balance, pt/family education   Mobility   min assist bed mobility and w/c mobility, mod assist transfers, mod assist gait at rail up to 20 ft  min A gait and transfers  gait training, transfer training, balance, L NMR, w/c mobility   Communication             Safety/Cognition/ Behavioral Observations  mod I   supervision-mod I   goals met    Pain        pain being managed     Skin        monitor skin-no issues        *See Care Plan and progress notes for long and short-term goals.     Barriers to Discharge  Current Status/Progress Possible Resolutions Date Resolved   Physician    Medical stability     See above  Therapies, follow labs, adjusted sleep meds, meds for tone      Nursing                  PT                    OT                  SLP  SW                Discharge Planning/Teaching Needs:  HOme to daughter's home in [place], she is here often and aware Mom will need 24 hr care at discharge      Team Discussion:  Progressing toward her goals of min assist level. Trials of regular diet currently on Dys 3 thin. Increased tone has put on baclofen. BP controlled and sleeping better. May need AFO. Has met speech goals DC today from. Cont B & B.   Revisions to Treatment Plan:  DC 10/24    Continued Need for Acute Rehabilitation Level of Care: The patient requires daily medical management by a physician with specialized training in physical medicine and rehabilitation for the following conditions: Daily direction of a multidisciplinary physical rehabilitation program to ensure safe treatment while eliciting the highest outcome that is of practical value to the patient.: Yes Daily medical management of patient stability for increased activity during participation in an intensive rehabilitation regime.: Yes Daily analysis of laboratory values and/or radiology reports with any  subsequent need for medication adjustment of medical intervention for : Neurological problems;Blood pressure problems;Other  [name] 05/15/2017, 8:56 AM

## 2017-05-15 NOTE — Progress Notes (Signed)
Occupational Therapy Session Note  Patient Details  Name: Kelly Lowe MRN: [id number] Date of Birth: 23-Mar-1951  Today's Date: 05/15/2017 OT Individual Time: 3532-9924 OT Individual Time Calculation (min): 58 min    Short Term Goals: Week 2:  OT Short Term Goal 1 (Week 2): Pt will complete toilet transfers with min A.   OT Short Term Goal 2 (Week 2): Pt will complete tub bench transfers with min A. OT Short Term Goal 3 (Week 2): Pt will maintain standing balance with steadying A while pulling pants over hips with min A. OT Short Term Goal 4 (Week 2): Pt will be able to wt shift to her L side with control to allow her to self cleanse post toileting with steadying A.  Skilled Therapeutic Interventions/Progress Updates:    Pt completed transfer from bedside recliner to wheelchair to start session.  She then worked on propelling her wheelchair down to the therapy gym with supervision.  Once in the gym she completed stand pivot transfer to the therapy mat with mod assist.  Therapist had her transition to supine for stretching of LUE internal rotators and shoulder extensors.  Next pt transitioned back to sitting for work on neuromuscular re-education for the LUE.  Began with weightbearing over the LLE in sitting while having pt reach across midline to target, encouraging LUE activation.  Max assist needed for activation of the LUE to help with keeping balance. Progressed to use of therapy ball on flat board, positioned in her lap.  Worked on small movements of external rotation.  Also used tilted stool for activation of shoulder flexion and elbow extension.  She was able to elicit multiple repetitions of shoulder flexion with elbow extension to push stool forward.  No active movement noted in digits or wrist at this time.  Pt left with PT for next session.   Therapy Documentation Precautions:  Precautions Precautions: Fall Precaution Comments: R gaze preference Restrictions Weight Bearing  Restrictions: No RLE Weight Bearing: Weight bearing as tolerated RLE Partial Weight Bearing Percentage or Pounds:  (weight only via heel) Other Position/Activity Restrictions: Must be in boot for any weight bearing  Pain: Pain Assessment Pain Assessment: No/denies pain ADL: See Function Navigator for Current Functional Status.   Therapy/Group: Individual Therapy  Danaija Eskridge OTR/L 05/15/2017, 3:53 PM

## 2017-05-15 NOTE — Progress Notes (Signed)
[facility]IC[facility]IT[facility]     PROGRESS NOTE  Subjective/Complaints:  Patient seen sitting up in her chair this morning. Daughter present. Daughter notes patient slept well overnight but is still sleepy this morning. Family with concerns about urinary retention as well as somnolence during the day yesterday.  ROS: Denies CP, SOB, N/V/D.  Objective: Vital Signs: Blood pressure (!) 145/76, pulse 64, temperature 98.9 F (37.2 C), temperature source Oral, resp. rate 18, height 5\' 7"  (1.702 m), weight 83.5 kg (184 lb), SpO2 95 %. No results found.  Recent Labs  05/13/17 0607  WBC 6.3  HGB 11.4*  HCT 35.0*  PLT 165    Recent Labs  05/13/17 0607  NA 139  K 4.0  CL 104  GLUCOSE 93  BUN 11  CREATININE 0.83  CALCIUM 9.8   CBG (last 3)  No results for input(s): GLUCAP in the last 72 hours.  Wt Readings from Last 3 Encounters:  05/15/17 83.5 kg (184 lb)  05/03/17 87.8 kg (193 lb 8 oz)    Physical Exam:  BP (!) 145/76 (BP Location: Right Arm)   Pulse 64   Temp 98.9 F (37.2 C) (Oral)   Resp 18   Ht 5\' 7"  (1.702 m)   Wt 83.5 kg (184 lb)   SpO2 95%   BMI 28.82 kg/m  Constitutional: She appears well-developedand well-nourished. NAD. HENT: Normocephalicand atraumatic.  Eyes: EOMI. No discharge.  Cardiovascular: RRR. No JVD. Respiratory: No respiratory distress. She has no wheezes.  GI: She exhibits no distension. BS+ Musculoskeletal: She exhibits no edemaor deformity.  Neurological:  Right gaze preference, improving.  Motor: LUE: 1/5 shoulder abduction, 0/5 distally (unchanged) LLE: 2+/5 HF, KE 2/5, 0/5 distally.  Increase in tone, elbow flexion, ankle plantarflexion Mild dysarthria  Follows simple commands.  Insight and awareness into deficits. Skin: Skin is warm.  Psychiatric: Normal mood and behavior  Assessment/Plan: 1. Functional deficits secondary to right basal ganglia infarct with recent right foot fracture which require 3+ hours  per day of interdisciplinary therapy in a comprehensive inpatient rehab setting. Physiatrist is providing close team supervision and 24 hour management of active medical problems listed below. Physiatrist and rehab team continue to assess barriers to discharge/monitor patient progress toward functional and medical goals.  Function:  Bathing Bathing position   Position: Shower  Bathing parts Body parts bathed by patient: Left arm, Chest, Abdomen, Right upper leg, Left upper leg, Front perineal area, Right lower leg, Left lower leg, Right arm Body parts bathed by helper: Back, Buttocks  Bathing assist Assist Level: Touching or steadying assistance(Pt > 75%)      Upper Body Dressing/Undressing Upper body dressing   What is the patient wearing?: Bra, Pull over shirt/dress Bra - Perfomed by patient: Thread/unthread right bra strap, Thread/unthread left bra strap Bra - Perfomed by helper: Hook/unhook bra (pull down sports bra) Pull over shirt/dress - Perfomed by patient: Thread/unthread right sleeve, Put head through opening, Pull shirt over trunk Pull over shirt/dress - Perfomed by helper: Thread/unthread left sleeve        Upper body assist Assist Level:  (Max assist)      Lower Body Dressing/Undressing Lower body dressing   What is the patient wearing?: Pants, Shoes, Non-skid slipper socks, Underwear Underwear - Performed by patient: Thread/unthread right underwear leg, Thread/unthread left underwear leg Underwear - Performed by helper: Pull underwear up/down Pants- Performed by patient: Thread/unthread right pants leg, Thread/unthread left pants leg Pants- Performed by helper: Pull pants up/down  Non-skid slipper socks- Performed by helper: Don/doff right sock       Shoes - Performed by helper: Don/doff left shoe   AFO - Performed by helper: Don/doff right AFO      Lower body assist Assist for lower body dressing: Touching or steadying assistance (Pt > 75%)       Toileting Toileting   Toileting steps completed by patient: Performs perineal hygiene Toileting steps completed by helper: Adjust clothing prior to toileting, Adjust clothing after toileting, Performs perineal hygiene Toileting Assistive Devices: Grab bar or rail  Toileting assist Assist level: Touching or steadying assistance (Pt.75%)   Transfers Chair/bed transfer   Chair/bed transfer method: Squat pivot, Stand pivot Chair/bed transfer assist level: Moderate assist (Pt 50 - 74%/lift or lower) Chair/bed transfer assistive device: Armrests, Medical sales representative     Max distance: 64ft Assist level: Moderate assist (Pt 50 - 74%)   Wheelchair   Type: Manual Max wheelchair distance: 150' Assist Level: Supervision or verbal cues  Cognition Comprehension Comprehension assist level: Follows complex conversation/direction with extra time/assistive device  Expression Expression assist level: Expresses complex ideas: With extra time/assistive device  Social Interaction Social Interaction assist level: Interacts appropriately with others with medication or extra time (anti-anxiety, antidepressant).  Problem Solving Problem solving assist level: Solves complex problems: With extra time  Memory Memory assist level: More than reasonable amount of time    Medical Problem List and Plan: 1. Left hemiparesis and functional deficitssecondary to right basal ganglia infarct with recent right foot fracture  Cont CIR  WHO/PRAFO better fitting, encouraged complete compliance  Fluoxetine started 10/5, increased on 10/11  Contacted Ortho, partial WB RLE. Xray reviewed, displacement of 5th digit, but healing. Per Ortho WBAT in boot and repeat films ~10/19.   Baclofen 5 TID started 10/10, DC'd on 10/11 due to lethargy  Trial tizanidine 2 mg 3 times a day when necessary  TEE relatively unremarkable with mild AI and MR per report 2. DVT Prophylaxis/Anticoagulation: Pharmaceutical:  Lovenox 3. Pain Management/Chronic low back pain: Ice /heat for back with ultram prn.  4. Mood: LCSW to follow for evaluation and support.  5. Neuropsych: This patient iscapable of making decisions on herown behalf. 6. Skin/Wound Care: routine pressure relief measures. Elevate RLE when seated.  7. Fluids/Electrolytes/Nutrition: Monitor I/O.   BMP within normal range on 10/9 8. Dyslipidemia: Continue lipitor.  9. HTN: Will resume HCTZ if indicated and will likely need Kdur for supplement.   Controlled 10/10 10. ABLA:   Hb 11.4 on 10/9  Cont to monitor 11. 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  Podiatrist to perform per daughter 36. Tachypnea/Tachycardia  Resolved 14. Sleep disturbance  Melatonin ordered 10/8  Improving 15. Neurogenic bladder   Bethanechol 5 mg 3 times a day started on 10/11  LOS (Days) 9 A FACE TO FACE EVALUATION WAS PERFORMED  Ankit Lorie Phenix 05/15/2017 8:32 AM

## 2017-05-15 NOTE — Progress Notes (Signed)
Physical Therapy Session Note  Patient Details  Name: Kelly Lowe MRN: [id number] Date of Birth: 11/28/1950  Today's Date: 05/15/2017 PT Individual Time: 1358-1430 PT Individual Time Calculation (min): 32 min  and Today's Date: 05/15/2017 PT Missed Time: 43 Minutes Missed Time Reason: Patient fatigue  Short Term Goals: Week 2:  PT Short Term Goal 1 (Week 2): Pt will ambulate 50 ft with Mod A using LRAD PT Short Term Goal 2 (Week 2): Pt will ascend/descend 4 steps with mod assist PT Short Term Goal 3 (Week 2): Pt will perform bed<>chair transfers with min assist  Skilled Therapeutic Interventions/Progress Updates:   Pt received from OT in therapy gym, no c/o pain and seated at edge of mat. Worked on gait training this session. Ambulated 25' w/ hemi walker and Mod A from therapist for balance, LLE step placement, and limb advancement. Pt had decreased L adductor tone this session allowing for decreased assistance required during LLE step placement. Verbal cues for gait pattern and hemi walker management. Performed pre-gait tasks of L step forward and back w/ same cues as listed above for stepping but less assistance from PT. Performed steps forward/backward w/ and w/o LAFO. Will continue to trial gait w/ LAFO as appropriate. Pt reporting increased fatigue throughout session and requested frequent rest breaks. She reports she did not sleep well last night and feels the medicine she is taking may be drowsy. Encouraged pt to ask RN to explain side effects of medications to her and express concern that a medication may be making her more drowsy than she would like. Pt verbalized understanding and in agreement. She refused to participate further in therapy stating "I just need to get some rest for a bit". Returned to room and transferred to recliner. Ended session in recliner, call bell within reach and all needs met.   Therapy Documentation Precautions:  Precautions Precautions:  Fall Precaution Comments: R gaze preference Restrictions Weight Bearing Restrictions: No RLE Weight Bearing: Weight bearing as tolerated RLE Partial Weight Bearing Percentage or Pounds:  (weight only via heel) Other Position/Activity Restrictions: Must be in boot for any weight bearing General: PT Amount of Missed Time (min): 43 Minutes PT Missed Treatment Reason: Patient fatigue Vital Signs: Therapy Vitals Pulse Rate: 62 Resp: 16 BP: 122/63 Patient Position (if appropriate): Sitting Oxygen Therapy SpO2: 93 % O2 Device: Not Delivered  See Function Navigator for Current Functional Status.   Therapy/Group: Individual Therapy  [name] K Arnette 05/15/2017, 6:28 PM

## 2017-05-16 ENCOUNTER — Inpatient Hospital Stay (HOSPITAL_COMMUNITY): Payer: Medicare Other | Admitting: Physical Therapy

## 2017-05-16 ENCOUNTER — Inpatient Hospital Stay (HOSPITAL_COMMUNITY): Payer: 59 | Admitting: Occupational Therapy

## 2017-05-16 ENCOUNTER — Inpatient Hospital Stay (HOSPITAL_COMMUNITY): Payer: Medicare Other

## 2017-05-16 NOTE — Progress Notes (Signed)
Occupational Therapy Session Note  Patient Details  Name: Kelly Lowe MRN: [id number] Date of Birth: 05/16/1951  Today's Date: 05/16/2017 OT Individual Time: 4128-2081 OT Individual Time Calculation (min): 55 min    Short Term Goals: Week 2:  OT Short Term Goal 1 (Week 2): Pt will complete toilet transfers with min A.   OT Short Term Goal 2 (Week 2): Pt will complete tub bench transfers with min A. OT Short Term Goal 3 (Week 2): Pt will maintain standing balance with steadying A while pulling pants over hips with min A. OT Short Term Goal 4 (Week 2): Pt will be able to wt shift to her L side with control to allow her to self cleanse post toileting with steadying A.  Skilled Therapeutic Interventions/Progress Updates:    1;1. Pt completes bathing and dressing at shower level. Pt grooms seated in w/c with supervision and Vc for sitting balance as pt leans laterally to L. Pt stand pivot transfer throughout session with MOD A and grab bars w/c>TTB with grab bars. Pt bathes with A to wash R arm, buttocks and back. Pt uses LHSS to wash BLE. Pt dresses seated in w/c at sink with Vc for hemi dressing techniques and A to thread BLE into pants 2/2 to time management. Pt sit to stand with MOD A and VC for posture for OT to advance pants past hips. Exited session with pt seated in w/c with call light in reach and all needs met.  Therapy Documentation Precautions:  Precautions Precautions: Fall Precaution Comments: R gaze preference Restrictions Weight Bearing Restrictions: No RLE Weight Bearing: Weight bearing as tolerated RLE Partial Weight Bearing Percentage or Pounds:  (weight only via heel) Other Position/Activity Restrictions: Must be in boot for any weight bearing    See Function Navigator for Current Functional Status.   Therapy/Group: Individual Therapy  Tonny Branch 05/16/2017, 12:41 PM

## 2017-05-16 NOTE — Progress Notes (Signed)
Occupational Therapy Session Note  Patient Details  Name: Kelly Lowe MRN: [id number] Date of Birth: [birth year]  Today's Date: 05/16/2017 OT Individual Time: 9450-3888 OT Individual Time Calculation (min): 57 min    Short Term Goals: Week 2:  OT Short Term Goal 1 (Week 2): Pt will complete toilet transfers with min A.   OT Short Term Goal 2 (Week 2): Pt will complete tub bench transfers with min A. OT Short Term Goal 3 (Week 2): Pt will maintain standing balance with steadying A while pulling pants over hips with min A. OT Short Term Goal 4 (Week 2): Pt will be able to wt shift to her L side with control to allow her to self cleanse post toileting with steadying A.  Skilled Therapeutic Interventions/Progress Updates:    Pt transitioned to the therapy gym in wheelchair, with therapist assist.  Transferred to the mat with mod assist stand pivot.  Worked on lateral weightshifts in sitting with emphasis on separating right and left trunk and increasing activity in the left trunk and LUE.  Worked on reciprical scooting with LUE in weightbearing to work on activation of left elbow extensors.  Pt needs mod assist to complete reciprical scooting on the left side, without activation of the right arm.  Next completed NMES to the left digit flexors and extensors for a total of 20 mins.  Pulse width set at 300 with duration at 35 and intensity on flexors at 18 and extensors at 31.  Pt instructed to assist with flexion and extension along with stimulation.  Also had her complete small AROM at the elbow with flexors and extensors.  She tolerated stimulus without any adverse reactions.  Finished session with return to the room with pt left up in wheelchair with call button and phone in reach.     Therapy Documentation Precautions:  Precautions Precautions: Fall Precaution Comments:   Restrictions Weight Bearing Restrictions: No RLE Weight Bearing: Weight bearing as tolerated RLE Partial Weight  Bearing Percentage or Pounds:  (weight only via heel) Other Position/Activity Restrictions: Must be in boot for any weight bearing  Pain: Pain Assessment Pain Assessment: No/denies pain ADL: See Function Navigator for Current Functional Status.   Therapy/Group: Individual Therapy  Naiyana Barbian OTR/L 05/16/2017, 3:56 PM

## 2017-05-16 NOTE — Progress Notes (Signed)
Physical Therapy Session Note  Patient Details  Name: Kelly Lowe MRN: [id number] Date of Birth: 12/03/1950  Today's Date: 05/16/2017 PT Individual Time: 1300-1355 AND 1630-1700 PT Individual Time Calculation (min): 55 min AND 30 min   Short Term Goals: Week 2:  PT Short Term Goal 1 (Week 2): Pt will ambulate 50 ft with Mod A using LRAD PT Short Term Goal 2 (Week 2): Pt will ascend/descend 4 steps with mod assist PT Short Term Goal 3 (Week 2): Pt will perform bed<>chair transfers with min assist  Skilled Therapeutic Interventions/Progress Updates:   Session 1: Pt in recliner upon arrival and agreeable to therapy, no c/o pain. Worked on gait training and pre gait tasks during first half of session. Gait w/ hemi-walker, 20' w/ Mod A and multimodal cues for gait pattern. Pt w/ decreased scissoring of gait this session, however still requires Mod-Max A for LLE foot placement. Pre-gait tasks in standing w/ UE support on hemi-walker: L step forward and backward and 2" LLE step tap and back to facilitate hip flexion during swing limb advancement. Worked on NMR 2nd half of session, NuStep 10 min @ L3 BLEs only. Multimodal cues for reciprocal pattern and keeping feet on pedals. Attempted w/o LLE abduction support, however added halfway through to assist w/ normal alignment of LLE. Returned to room via w/c, Total A and ended session in w/c, call bell within reach and all needs met.   Session 2:  Pt in recliner upon arrival and agreeable to therapy, no c/o pain. Worked on w/c mobility this session in 100' bouts to increase functional endurance and independence. Multiple rests 2/2 fatigue using R hemi technique for self-propulsion w/ supervision from therapist. Self-propelled w/c to/from outside and performed multiple bouts of static standing w/o AD and sit<>stand transfers w/ Mod-Min A. Returned to room and ended session in recliner, call bell within reach and all needs met.   Therapy  Documentation Precautions:  Precautions Precautions: Fall Precaution Comments:   Restrictions Weight Bearing Restrictions: No RLE Weight Bearing: Weight bearing as tolerated RLE Partial Weight Bearing Percentage or Pounds:  (weight only via heel) Other Position/Activity Restrictions: Must be in boot for any weight bearing  See Function Navigator for Current Functional Status.   Therapy/Group: Individual Therapy  Amy K Arnette 05/16/2017, 7:47 PM  

## 2017-05-16 NOTE — Progress Notes (Signed)
PHYSICAL MEDICINE & REHABILITATION     PROGRESS NOTE  Subjective/Complaints:  Patient in room with physician assistant this morning. No complaints. Stated that she had some diarrhea last night, nursing notes indicate incontinent with type. 5. Stool  ROS: Denies CP, SOB, N/V/D.  Objective: Vital Signs: Blood pressure (!) 143/90, pulse 64, temperature 98.7 F (37.1 C), temperature source Oral, resp. rate 15, height 5\' 7"  (1.702 m), weight 83.5 kg (184 lb), SpO2 99 %. No results found. No results for input(s): WBC, HGB, HCT, PLT in the last 72 hours. No results for input(s): NA, K, CL, GLUCOSE, BUN, CREATININE, CALCIUM in the last 72 hours.  Invalid input(s): CO CBG (last 3)  No results for input(s): GLUCAP in the last 72 hours.  Wt Readings from Last 3 Encounters:  05/15/17 83.5 kg (184 lb)  05/03/17 87.8 kg (193 lb 8 oz)    Physical Exam:  BP (!) 143/90 (BP Location: Right Arm)   Pulse 64   Temp 98.7 F (37.1 C) (Oral)   Resp 15   Ht 5\' 7"  (1.702 m)   Wt 83.5 kg (184 lb)   SpO2 99%   BMI 28.82 kg/m  Constitutional: She appears well-developedand well-nourished. NAD. HENT: Normocephalicand atraumatic.  Eyes: EOMI. No discharge.  Cardiovascular: RRR. No JVD. Respiratory: No respiratory distress. She has no wheezes.  GI: She exhibits no distension. BS+ Musculoskeletal: She exhibits no edemaor deformity.  Neurological:  Right gaze preference, improving.  Motor: LUE: 1/5 shoulder abduction, 0/5 distally (unchanged) LLE: 2+/5 HF, KE 2/5, 0/5 distally.  Increase in tone, elbow flexion, ankle plantarflexion Mild dysarthria  Follows simple commands.  Sensation is normal on left side Insight and awareness into deficits. Skin: Skin is warm.  Psychiatric: Normal mood and behavior  Assessment/Plan: 1. Functional deficits secondary to right basal ganglia infarct with recent right foot fracture which require 3+ hours per day of interdisciplinary therapy in a  comprehensive inpatient rehab setting. Physiatrist is providing close team supervision and 24 hour management of active medical problems listed below. Physiatrist and rehab team continue to assess barriers to discharge/monitor patient progress toward functional and medical goals.  Function:  Bathing Bathing position   Position: Shower  Bathing parts Body parts bathed by patient: Left arm, Chest, Abdomen, Right upper leg, Left upper leg, Front perineal area, Right lower leg, Left lower leg, Right arm Body parts bathed by helper: Back, Buttocks  Bathing assist Assist Level: Touching or steadying assistance(Pt > 75%)      Upper Body Dressing/Undressing Upper body dressing   What is the patient wearing?: Bra, Pull over shirt/dress Bra - Perfomed by patient: Thread/unthread right bra strap, Thread/unthread left bra strap Bra - Perfomed by helper: Hook/unhook bra (pull down sports bra) Pull over shirt/dress - Perfomed by patient: Thread/unthread right sleeve, Put head through opening, Pull shirt over trunk Pull over shirt/dress - Perfomed by helper: Thread/unthread left sleeve        Upper body assist Assist Level:  (Max assist)      Lower Body Dressing/Undressing Lower body dressing   What is the patient wearing?: Pants, Shoes, Non-skid slipper socks, Underwear Underwear - Performed by patient: Thread/unthread right underwear leg, Thread/unthread left underwear leg Underwear - Performed by helper: Pull underwear up/down Pants- Performed by patient: Thread/unthread right pants leg, Thread/unthread left pants leg Pants- Performed by helper: Pull pants up/down   Non-skid slipper socks- Performed by helper: Don/doff right sock       Shoes - Performed by  helper: Don/doff left shoe   AFO - Performed by helper: Don/doff right AFO      Lower body assist Assist for lower body dressing: Touching or steadying assistance (Pt > 75%)      Toileting Toileting   Toileting steps completed  by patient: Performs perineal hygiene Toileting steps completed by helper: Adjust clothing prior to toileting, Adjust clothing after toileting, Performs perineal hygiene Toileting Assistive Devices: Grab bar or rail  Toileting assist Assist level: Touching or steadying assistance (Pt.75%)   Transfers Chair/bed transfer   Chair/bed transfer method: Stand pivot Chair/bed transfer assist level: Moderate assist (Pt 50 - 74%/lift or lower) Chair/bed transfer assistive device: Armrests     Locomotion Ambulation     Max distance: 23' Assist level: Moderate assist (Pt 50 - 74%)   Wheelchair   Type: Manual Max wheelchair distance: 150' Assist Level: Supervision or verbal cues  Cognition Comprehension Comprehension assist level: Follows complex conversation/direction with extra time/assistive device  Expression Expression assist level: Expresses complex ideas: With extra time/assistive device  Social Interaction Social Interaction assist level: Interacts appropriately with others with medication or extra time (anti-anxiety, antidepressant).  Problem Solving Problem solving assist level: Solves complex problems: With extra time  Memory Memory assist level: More than reasonable amount of time    Medical Problem List and Plan: 1. Left hemiparesis and functional deficitssecondary to right basal ganglia infarct with recent right foot fracture  Cont CIR  WHO/PRAFO better fitting, encouraged complete compliance  Fluoxetine started 10/5, increased on 10/11  Contacted Ortho, partial WB RLE. Xray reviewed, displacement of 5th digit, but healing. Per Ortho WBAT in boot and repeat films ~10/19.   Baclofen 5 TID started 10/10, DC'd on 10/11 due to lethargy  Trial tizanidine 2 mg 3 times a day when necessary  TEE relatively unremarkable with mild AI and MR per report 2. DVT Prophylaxis/Anticoagulation: Pharmaceutical: Lovenox 3. Pain Management/Chronic low back pain: Ice /heat for back with ultram  prn.  4. Mood: LCSW to follow for evaluation and support.  5. Neuropsych: This patient iscapable of making decisions on herown behalf. 6. Skin/Wound Care: routine pressure relief measures. Elevate RLE when seated.  7. Fluids/Electrolytes/Nutrition: Monitor I/O.   BMP within normal range on 10/9 8. Dyslipidemia: Continue lipitor.  9. HTN: Will resume HCTZ if indicated and will likely need Kdur for supplement.   Some lability noted. Continue to monitor Vitals:   05/16/17 0500 05/16/17 1440  BP: 128/69 (!) 143/90  Pulse: 63 64  Resp: 16 15  Temp: 97.7 F (36.5 C) 98.7 F (37.1 C)  SpO2: 96% 99%   10. ABLA:   Hb 11.4 on 10/9  Cont to monitor 11. 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  Podiatrist to perform per daughter 14. Tachypnea/Tachycardia  Resolved 14. Sleep disturbance  Melatonin ordered 10/8  Improving 15. Neurogenic bladder   Bethanechol 5 mg 3 times a day started on 10/11  LOS (Days) 10 A FACE TO FACE EVALUATION WAS PERFORMED  Charlett Blake 05/16/2017 4:50 PM

## 2017-05-17 ENCOUNTER — Inpatient Hospital Stay (HOSPITAL_COMMUNITY): Payer: Medicare Other

## 2017-05-17 NOTE — Progress Notes (Signed)
Occupational Therapy Session Note  Patient Details  Name: Kelly Lowe MRN: [id number] Date of Birth: 03/22/1951  Today's Date: 05/17/2017 OT Individual Time: 4259-5638 OT Individual Time Calculation (min): 57 min    Short Term Goals: Week 2:  OT Short Term Goal 1 (Week 2): Pt will complete toilet transfers with min A.   OT Short Term Goal 2 (Week 2): Pt will complete tub bench transfers with min A. OT Short Term Goal 3 (Week 2): Pt will maintain standing balance with steadying A while pulling pants over hips with min A. OT Short Term Goal 4 (Week 2): Pt will be able to wt shift to her L side with control to allow her to self cleanse post toileting with steadying A.  Skilled Therapeutic Interventions/Progress Updates:    1;1. No pain reported. Pt stand pivot transfer recliner<>w/c<>TTB with MOD A for lifting and VC for weight shifting/hand placement througout transfer. Pt bathes at seated with HOH A of LUE to wash RUE for NMR and A to wash back/buttocks. Pt requries MOD cues throughout shower to correct L lean/rotation. Pt dresses at sit to stand level with A to clasp bra in back and min VC for hemi techniques when donning shirt. Pt requries MOD A to bring LLE to seated figure 4 to thread underwear/pant leg. Pt stands with CAM boot on and advances pants past hips, however requires A to advance underwear past hips d/t the underwear "twisting." Pt requires question cues to assume seated figure 4 to don L shoe. Exited session with pt seated in reclienr with call lgiht in reach and all needs met.   Therapy Documentation Precautions:  Precautions Precautions: Fall Precaution Comments:   Restrictions Weight Bearing Restrictions: No RLE Weight Bearing: Weight bearing as tolerated RLE Partial Weight Bearing Percentage or Pounds:  (weight only via heel) Other Position/Activity Restrictions: Must be in boot for any weight bearing   See Function Navigator for Current Functional  Status.   Therapy/Group: Individual Therapy  Tonny Branch 05/17/2017, 11:11 AM

## 2017-05-17 NOTE — Progress Notes (Signed)
[place] PHYSICAL MEDICINE & REHABILITATION     PROGRESS NOTE  Subjective/Complaints:  No issues overnite  ROS: Denies CP, SOB, N/V/D.  Objective: Vital Signs: Blood pressure 133/73, pulse 68, temperature 98 F (36.7 C), temperature source Oral, resp. rate 16, height 5\' 7"  (1.702 m), weight 83.5 kg (184 lb), SpO2 98 %. No results found. No results for input(s): WBC, HGB, HCT, PLT in the last 72 hours. No results for input(s): NA, K, CL, GLUCOSE, BUN, CREATININE, CALCIUM in the last 72 hours.  Invalid input(s): CO CBG (last 3)  No results for input(s): GLUCAP in the last 72 hours.  Wt Readings from Last 3 Encounters:  05/15/17 83.5 kg (184 lb)  05/03/17 87.8 kg (193 lb 8 oz)    Physical Exam:  BP 133/73 (BP Location: Right Arm)   Pulse 68   Temp 98 F (36.7 C) (Oral)   Resp 16   Ht 5\' 7"  (1.702 m)   Wt 83.5 kg (184 lb)   SpO2 98%   BMI 28.82 kg/m  Constitutional: She appears well-developedand well-nourished. NAD. HENT: Normocephalicand atraumatic.  Eyes: EOMI. No discharge.  Cardiovascular: RRR. No JVD. Respiratory: No respiratory distress. She has no wheezes.  GI: She exhibits no distension. BS+ Musculoskeletal: She exhibits no edemaor deformity.  Neurological:  Right gaze preference, improving.  Motor: LUE: 1/5 shoulder abduction, 0/5 distally (unchanged) LLE: 2+/5 HF, KE 2/5, 0/5 distally.  Increase in tone,Left pectoralis elbow flexion, ankle plantarflexion Mild dysarthria  Follows simple commands.  Sensation is normal on left side Insight and awareness into deficits. Skin: Skin is warm.  Psychiatric: Normal mood and behavior  Assessment/Plan: 1. Functional deficits secondary to right basal ganglia infarct with recent right foot fracture which require 3+ hours per day of interdisciplinary therapy in a comprehensive inpatient rehab setting. Physiatrist is providing close team supervision and 24 hour management of active medical problems listed  below. Physiatrist and rehab team continue to assess barriers to discharge/monitor patient progress toward functional and medical goals.  Function:  Bathing Bathing position   Position: Shower  Bathing parts Body parts bathed by patient: Left arm, Chest, Abdomen, Right upper leg, Left upper leg, Front perineal area, Right lower leg, Left lower leg, Right arm Body parts bathed by helper: Back, Buttocks  Bathing assist Assist Level: Touching or steadying assistance(Pt > 75%)      Upper Body Dressing/Undressing Upper body dressing   What is the patient wearing?: Bra, Pull over shirt/dress Bra - Perfomed by patient: Thread/unthread right bra strap, Thread/unthread left bra strap Bra - Perfomed by helper: Hook/unhook bra (pull down sports bra) Pull over shirt/dress - Perfomed by patient: Thread/unthread right sleeve, Put head through opening, Pull shirt over trunk Pull over shirt/dress - Perfomed by helper: Thread/unthread left sleeve        Upper body assist Assist Level:  (Max assist)      Lower Body Dressing/Undressing Lower body dressing   What is the patient wearing?: Pants, Shoes, Non-skid slipper socks, Underwear Underwear - Performed by patient: Thread/unthread right underwear leg, Thread/unthread left underwear leg Underwear - Performed by helper: Pull underwear up/down Pants- Performed by patient: Thread/unthread right pants leg, Thread/unthread left pants leg Pants- Performed by helper: Pull pants up/down   Non-skid slipper socks- Performed by helper: Don/doff right sock       Shoes - Performed by helper: Don/doff left shoe   AFO - Performed by helper: Don/doff right AFO      Lower body assist Assist for  lower body dressing: Touching or steadying assistance (Pt > 75%)      Toileting Toileting   Toileting steps completed by patient: Performs perineal hygiene Toileting steps completed by helper: Adjust clothing prior to toileting, Adjust clothing after  toileting Toileting Assistive Devices: Grab bar or rail  Toileting assist Assist level: Touching or steadying assistance (Pt.75%)   Transfers Chair/bed transfer   Chair/bed transfer method: Stand pivot Chair/bed transfer assist level: Moderate assist (Pt 50 - 74%/lift or lower) Chair/bed transfer assistive device: Armrests     Locomotion Ambulation     Max distance: 20' Assist level: Moderate assist (Pt 50 - 74%)   Wheelchair   Type: Manual Max wheelchair distance: 150' Assist Level: Supervision or verbal cues  Cognition Comprehension Comprehension assist level: Follows complex conversation/direction with extra time/assistive device  Expression Expression assist level: Expresses complex ideas: With extra time/assistive device  Social Interaction Social Interaction assist level: Interacts appropriately with others with medication or extra time (anti-anxiety, antidepressant).  Problem Solving Problem solving assist level: Solves complex problems: With extra time  Memory Memory assist level: More than reasonable amount of time    Medical Problem List and Plan: 1. Left hemiparesis and functional deficitssecondary to right basal ganglia infarct with recent right foot fracture  Cont CIR  WHO/PRAFO better fitting, encouraged complete compliance  Fluoxetine started 10/5, increased on 10/11  Contacted Ortho, partial WB RLE. Xray reviewed, displacement of 5th digit, but healing. Per Ortho WBAT in boot and repeat films ~10/19.   Baclofen 5 TID started 10/10, DC'd on 10/11 due to lethargy  Trial tizanidine 2 mg 3 times a day when necessary- pt states she wants to avoid this due to drowsiness  TEE relatively unremarkable with mild AI and MR per report 2. DVT Prophylaxis/Anticoagulation: Pharmaceutical: Lovenox 3. Pain Management/Chronic low back pain: Ice /heat for back with ultram prn.  4. Mood: LCSW to follow for evaluation and support.  5. Neuropsych: This patient iscapable of  making decisions on herown behalf. 6. Skin/Wound Care: routine pressure relief measures. Elevate RLE when seated.  7. Fluids/Electrolytes/Nutrition: Monitor I/O.   BMP within normal range on 10/9 8. Dyslipidemia: Continue lipitor.  9. HTN: Will resume HCTZ if indicated and will likely need Kdur for supplement.   Some lability noted. Continue to monitor Vitals:   05/16/17 1440 05/17/17 0305  BP: (!) 143/90 133/73  Pulse: 64 68  Resp: 15 16  Temp: 98.7 F (37.1 C) 98 F (36.7 C)  SpO2: 99% 98%   10. ABLA:   Hb 11.4 on 10/9  Cont to monitor 11. 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  Podiatrist to perform per daughter 19. Tachypnea/Tachycardia  Resolved 14. Sleep disturbance  Melatonin ordered 10/8  Improving 15. Neurogenic bladder   Bethanechol 5 mg 3 times a day started on 10/11  LOS (Days) 11 A FACE TO FACE EVALUATION WAS PERFORMED  Charlett Blake 05/17/2017 8:37 AM

## 2017-05-18 ENCOUNTER — Inpatient Hospital Stay (HOSPITAL_COMMUNITY): Payer: Medicare Other | Admitting: Occupational Therapy

## 2017-05-18 NOTE — Progress Notes (Signed)
[place] PHYSICAL MEDICINE & REHABILITATION     PROGRESS NOTE  Subjective/Complaints:   No issues overnite  ROS: Denies CP, SOB, N/V/D.  Objective: Vital Signs: Blood pressure 118/62, pulse 65, temperature 97.8 F (36.6 C), temperature source Oral, resp. rate 18, height 5\' 7"  (1.702 m), weight 83.5 kg (184 lb), SpO2 93 %. No results found. No results for input(s): WBC, HGB, HCT, PLT in the last 72 hours. No results for input(s): NA, K, CL, GLUCOSE, BUN, CREATININE, CALCIUM in the last 72 hours.  Invalid input(s): CO CBG (last 3)  No results for input(s): GLUCAP in the last 72 hours.  Wt Readings from Last 3 Encounters:  05/15/17 83.5 kg (184 lb)  05/03/17 87.8 kg (193 lb 8 oz)    Physical Exam:  BP 118/62 (BP Location: Right Arm)   Pulse 65   Temp 97.8 F (36.6 C) (Oral)   Resp 18   Ht 5\' 7"  (1.702 m)   Wt 83.5 kg (184 lb)   SpO2 93%   BMI 28.82 kg/m  Constitutional: She appears well-developedand well-nourished. NAD. HENT: Normocephalicand atraumatic.  Eyes: EOMI. No discharge.  Cardiovascular: RRR. No JVD. Respiratory: No respiratory distress. She has no wheezes.  GI: She exhibits no distension. BS+ Musculoskeletal: She exhibits no edemaor deformity.  Neurological:  Right gaze preference, improving.  Motor: LUE: 1/5 shoulder abduction, 0/5 distally (unchanged) LLE: 2+/5 HF, KE 2/5, 0/5 distally.  Increase in tone,Left pectoralis elbow flexion, ankle plantarflexion Mild dysarthria  Follows simple commands.  Sensation is normal on left side Insight and awareness into deficits. Skin: Skin is warm.  Psychiatric: Normal mood and behavior  Assessment/Plan: 1. Functional deficits secondary to right basal ganglia infarct with recent right foot fracture which require 3+ hours per day of interdisciplinary therapy in a comprehensive inpatient rehab setting. Physiatrist is providing close team supervision and 24 hour management of active medical problems listed  below. Physiatrist and rehab team continue to assess barriers to discharge/monitor patient progress toward functional and medical goals.  Function:  Bathing Bathing position   Position: Shower  Bathing parts Body parts bathed by patient: Left arm, Chest, Abdomen, Right upper leg, Left upper leg, Front perineal area, Right lower leg, Left lower leg, Right arm Body parts bathed by helper: Back, Buttocks  Bathing assist Assist Level: Touching or steadying assistance(Pt > 75%)      Upper Body Dressing/Undressing Upper body dressing   What is the patient wearing?: Bra, Pull over shirt/dress Bra - Perfomed by patient: Thread/unthread right bra strap, Thread/unthread left bra strap Bra - Perfomed by helper: Hook/unhook bra (pull down sports bra) Pull over shirt/dress - Perfomed by patient: Thread/unthread right sleeve, Put head through opening, Pull shirt over trunk Pull over shirt/dress - Perfomed by helper: Thread/unthread left sleeve        Upper body assist Assist Level:  (Max assist)      Lower Body Dressing/Undressing Lower body dressing   What is the patient wearing?: Pants, Shoes, Non-skid slipper socks, Underwear Underwear - Performed by patient: Thread/unthread right underwear leg, Thread/unthread left underwear leg Underwear - Performed by helper: Pull underwear up/down Pants- Performed by patient: Thread/unthread right pants leg, Thread/unthread left pants leg Pants- Performed by helper: Pull pants up/down   Non-skid slipper socks- Performed by helper: Don/doff right sock       Shoes - Performed by helper: Don/doff left shoe   AFO - Performed by helper: Don/doff right AFO      Lower body assist Assist  for lower body dressing: Touching or steadying assistance (Pt > 75%)      Toileting Toileting   Toileting steps completed by patient: Performs perineal hygiene Toileting steps completed by helper: Adjust clothing prior to toileting, Adjust clothing after  toileting Toileting Assistive Devices: Grab bar or rail  Toileting assist Assist level: Touching or steadying assistance (Pt.75%)   Transfers Chair/bed transfer   Chair/bed transfer method: Stand pivot Chair/bed transfer assist level: Moderate assist (Pt 50 - 74%/lift or lower) Chair/bed transfer assistive device: Armrests     Locomotion Ambulation     Max distance: 20' Assist level: Moderate assist (Pt 50 - 74%)   Wheelchair   Type: Manual Max wheelchair distance: 150' Assist Level: Supervision or verbal cues  Cognition Comprehension Comprehension assist level: Follows complex conversation/direction with extra time/assistive device  Expression Expression assist level: Expresses complex ideas: With extra time/assistive device  Social Interaction Social Interaction assist level: Interacts appropriately with others with medication or extra time (anti-anxiety, antidepressant).  Problem Solving Problem solving assist level: Solves complex problems: With extra time  Memory Memory assist level: More than reasonable amount of time    Medical Problem List and Plan: 1. Left hemiparesis and functional deficitssecondary to right basal ganglia infarct with recent right foot fracture  Cont CIR-PT, OT, SLP WHO/PRAFO better fitting, encouraged complete compliance  Fluoxetine started 10/5, increased on 10/11  Contacted Ortho, partial WB RLE. Xray reviewed, displacement of 5th digit, but healing. Per Ortho WBAT in boot and repeat films ~10/19.   Baclofen 5 TID started 10/10, DC'd on 10/11 due to lethargy  Trial tizanidine 2 mg 3 times a day when necessary- pt states she wants to avoid this due to drowsiness  TEE relatively unremarkable with mild AI and MR per report 2. DVT Prophylaxis/Anticoagulation: Pharmaceutical: Lovenox 3. Pain Management/Chronic low back pain: Ice /heat for back with ultram prn.  4. Mood: LCSW to follow for evaluation and support.  5. Neuropsych: This patient  iscapable of making decisions on herown behalf. 6. Skin/Wound Care: routine pressure relief measures. Elevate RLE when seated.  7. Fluids/Electrolytes/Nutrition: Monitor I/O.   BMP within normal range on 10/9 8. Dyslipidemia: Continue lipitor.  9. HTN: Will resume HCTZ if indicated and will likely need Kdur for supplement.   Controlled off BP meds Vitals:   05/17/17 1500 05/18/17 0454  BP: 130/63 118/62  Pulse: 64 65  Resp: 16 18  Temp: 98.6 F (37 C) 97.8 F (36.6 C)  SpO2: 98% 93%   10. ABLA:   Hb 11.4 on 10/9  Cont to monitor 11. 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  Podiatrist to perform per daughter 59. Tachypnea/Tachycardia  Resolved 14. Sleep disturbance  Melatonin ordered 10/8  Improving 15. Neurogenic bladder   Bethanechol 5 mg 3 times a day started on 10/11  LOS (Days) 12 A FACE TO FACE EVALUATION WAS PERFORMED  Charlett Blake 05/18/2017 8:33 AM

## 2017-05-18 NOTE — Progress Notes (Signed)
Occupational Therapy Session Note  Patient Details  Name: Kelly Lowe MRN: [id number] Date of Birth: 1951/02/11  Today's Date: 05/18/2017 OT Individual Time: 1431-1500 OT Individual Time Calculation (min): 29 min   Short Term Goals: Week 2:  OT Short Term Goal 1 (Week 2): Pt will complete toilet transfers with min A.   OT Short Term Goal 2 (Week 2): Pt will complete tub bench transfers with min A. OT Short Term Goal 3 (Week 2): Pt will maintain standing balance with steadying A while pulling pants over hips with min A. OT Short Term Goal 4 (Week 2): Pt will be able to wt shift to her L side with control to allow her to self cleanse post toileting with steadying A.  Skilled Therapeutic Interventions/Progress Updates:    Tx focus on Lt NMR and trunk control during meaningful occupational participation.   Pt greeted in recliner with Rt CAM boot donned. Agreeable to tx and declining self care activities. Pt participated in seated dancing, working on bilateral and reciprocal shoulder movements in gravity eliminated planes. Active assist ROM in beat to music. When pt reported her affected limb became heavy, she danced with OT (OT supporting left forearm up to elbow). Increased trunk demands by having pt sit at edge of chair throughout dancing. Incorporated trunk rotation and lateral bends in conjunction with bilateral shoulder/UE movements. She was also motivated to work on isolated L LE kicks. Pt becoming teary when listening to [employer]: "I'm sorry. The music just sounds so beautiful." Pt observed to exhibit flow during session, actively engaged and declining rest breaks. Grateful for the incorporation of her favorite music. Prior to session exit, pt was repositioned for comfort and left with all needs within reach.   Therapy Documentation Precautions:  Precautions Precautions: Fall Precaution Comments:   Restrictions Weight Bearing Restrictions: No RLE Weight Bearing: Weight  bearing as tolerated RLE Partial Weight Bearing Percentage or Pounds:  (weight only via heel) Other Position/Activity Restrictions: Must be in boot for any weight bearing Vital Signs: Therapy Vitals Temp: 98.3 F (36.8 C) Temp Source: Oral Pulse Rate: 60 Resp: 14 BP: 136/67 Patient Position (if appropriate): Sitting Oxygen Therapy SpO2: 99 % O2 Device: Not Delivered Pain: No c/o pain    ADL: :   See Function Navigator for Current Functional Status.   Therapy/Group: Individual Therapy  Orvin Netter A Kire Ferg 05/18/2017, 5:43 PM

## 2017-05-19 ENCOUNTER — Inpatient Hospital Stay (HOSPITAL_COMMUNITY): Payer: Medicare Other

## 2017-05-19 ENCOUNTER — Inpatient Hospital Stay (HOSPITAL_COMMUNITY): Payer: Medicare Other | Admitting: Occupational Therapy

## 2017-05-19 ENCOUNTER — Inpatient Hospital Stay (HOSPITAL_COMMUNITY): Payer: 59 | Admitting: Occupational Therapy

## 2017-05-19 DIAGNOSIS — G8194 Hemiplegia, unspecified affecting left nondominant side: Secondary | ICD-10-CM

## 2017-05-19 LAB — URINALYSIS, COMPLETE (UACMP) WITH MICROSCOPIC
Bilirubin Urine: NEGATIVE
GLUCOSE, UA: NEGATIVE mg/dL
Hgb urine dipstick: NEGATIVE
Ketones, ur: NEGATIVE mg/dL
Nitrite: NEGATIVE
PH: 5 (ref 5.0–8.0)
PROTEIN: NEGATIVE mg/dL
SPECIFIC GRAVITY, URINE: 1.01 (ref 1.005–1.030)

## 2017-05-19 NOTE — Progress Notes (Signed)
Occupational Therapy Session Note  Patient Details  Name: Kelly Lowe MRN: [id number] Date of Birth: 24-Sep-1950  Today's Date: 05/19/2017 OT Individual Time: 1446-1530 OT Individual Time Calculation (min): 44 min    Short Term Goals: Week 2:  OT Short Term Goal 1 (Week 2): Pt will complete toilet transfers with min A.   OT Short Term Goal 2 (Week 2): Pt will complete tub bench transfers with min A. OT Short Term Goal 3 (Week 2): Pt will maintain standing balance with steadying A while pulling pants over hips with min A. OT Short Term Goal 4 (Week 2): Pt will be able to wt shift to her L side with control to allow her to self cleanse post toileting with steadying A.  Skilled Therapeutic Interventions/Progress Updates:    Pt worked on Brewing technologist for the LUE during session.  Had pt transfer from wheelchair to mat with mod assist stand pivot.  Mod assist needed to help with advancement and positioning of the LLE as well during transfer.  She was able to work on weightbearing through the LUE in order to asisst with reciprical scooting with mod assist on the left side.  She tends to want to overuse the right side to assist with scooting the left, but was able to improve with increased practice.  Utilized Public librarian for work on internal and external rotation with min assist and min instructional cueing to avoid trunk compensation.  Progressed to pushing tilted stool as well for shoulder flexion and extension.  Pt still demonstrating only trace shoulder movements at this time.  Did note less tone in the biceps and pectoral compared to last week however.  Returned to room via wheelchair at end of session with PT taking over for next session.      Therapy Documentation Precautions:  Precautions Precautions: Fall Precaution Comments:   Restrictions Weight Bearing Restrictions: No RLE Weight Bearing: Weight bearing as tolerated RLE Partial Weight Bearing Percentage or Pounds:   (weight only via heel) Other Position/Activity Restrictions: Must be in boot for any weight bearing  Pain: Pain Assessment Pain Assessment: No/denies pain ADL: See Function Navigator for Current Functional Status.   Therapy/Group: Individual Therapy  Talon Regala OTR/L 05/19/2017, 3:51 PM

## 2017-05-19 NOTE — Progress Notes (Signed)
Occupational Therapy Session Note  Patient Details  Name: Kelly Lowe MRN: [id number] Date of Birth: 09-13-1950  Today's Date: 05/19/2017 OT Individual Time: 4174-0814 OT Individual Time Calculation (min): 44 min   Short Term Goals: Week 2:  OT Short Term Goal 1 (Week 2): Pt will complete toilet transfers with min A.   OT Short Term Goal 2 (Week 2): Pt will complete tub bench transfers with min A. OT Short Term Goal 3 (Week 2): Pt will maintain standing balance with steadying A while pulling pants over hips with min A. OT Short Term Goal 4 (Week 2): Pt will be able to wt shift to her L side with control to allow her to self cleanse post toileting with steadying A.  Skilled Therapeutic Interventions/Progress Updates:    Tx focus on Lt NMR, pain mgt, and activity tolerance during participation in meaningful activities.   Pt greeted in recliner, requesting to work on her Lt shoulder due to 4/10 pain. Brief thermotherapy to Lt shoulder (hot/cold sensation intact) with gentle massage completed to Lt deltoid and upper traps. Gentle AAROM stretching at elbow, wrist, and digit joints (by OT and pt). Educated her on deep breathing during stretches, as well as mental practice techniques for NMR. Continued to work on bilateral and reciprocal shoulder movements in gravity eliminated planes in beat to her favorite music (per request). Facilitated trunk NMR by having her dance without back support. At end of tx pt was repositioned for comfort and left with all needs within reach. Requesting for Laqueta Linden music to continue playing in her room.  Reported pain post tx 0/10.   Therapy Documentation Precautions:  Precautions Precautions: Fall Precaution Comments:   Restrictions Weight Bearing Restrictions: No RLE Weight Bearing: Weight bearing as tolerated RLE Partial Weight Bearing Percentage or Pounds:  (weight only via heel) Other Position/Activity Restrictions: Must be in boot for any  weight bearing Pain: Addressed as written above    ADL:     See Function Navigator for Current Functional Status.   Therapy/Group: Individual Therapy  Kollins Fenter A Joellyn Grandt 05/19/2017, 12:20 PM

## 2017-05-19 NOTE — Progress Notes (Addendum)
[facility] PHYSICAL MEDICINE & REHABILITATION     PROGRESS NOTE  Subjective/Complaints:  Pt seen sitting up this AM.  She slept better overnight.  She believes she had drowsiness with Tizanidine.   ROS: Denies CP, SOB, N/V/D.  Objective: Vital Signs: Blood pressure 134/69, pulse 65, temperature 98.1 F (36.7 C), temperature source Oral, resp. rate 17, height 5\' 7"  (1.702 m), weight 83.5 kg (184 lb), SpO2 97 %. No results found. No results for input(s): WBC, HGB, HCT, PLT in the last 72 hours. No results for input(s): NA, K, CL, GLUCOSE, BUN, CREATININE, CALCIUM in the last 72 hours.  Invalid input(s): CO CBG (last 3)  No results for input(s): GLUCAP in the last 72 hours.  Wt Readings from Last 3 Encounters:  05/15/17 83.5 kg (184 lb)  05/03/17 87.8 kg (193 lb 8 oz)    Physical Exam:  BP 134/69 (BP Location: Right Arm)   Pulse 65   Temp 98.1 F (36.7 C) (Oral)   Resp 17   Ht 5\' 7"  (1.702 m)   Wt 83.5 kg (184 lb)   SpO2 97%   BMI 28.82 kg/m  Constitutional: She appears well-developedand well-nourished. NAD. HENT: Normocephalicand atraumatic.  Eyes: EOMI. No discharge.  Cardiovascular: RRR. No JVD. Respiratory: No respiratory distress. She has no wheezes.  GI: She exhibits no distension. BS+ Musculoskeletal: She exhibits no edemaor deformity.  Neurological:  Right gaze preference, improving.  Motor: LUE: 1/5 shoulder abduction, 0/5 distally (stable) LLE: 3-/5 HF, KE 3/5, 0/5 distally.  Increase in tone,Left pectoralis elbow flexion, ankle plantarflexion Mild dysarthria  Follows simple commands.  Insight and awareness into deficits. Skin: Skin is warm.  Psychiatric: Normal mood and behavior  Assessment/Plan: 1. Functional deficits secondary to right basal ganglia infarct with recent right foot fracture which require 3+ hours per day of interdisciplinary therapy in a comprehensive inpatient rehab setting. Physiatrist is providing close team supervision and 24  hour management of active medical problems listed below. Physiatrist and rehab team continue to assess barriers to discharge/monitor patient progress toward functional and medical goals.  Function:  Bathing Bathing position   Position: Shower  Bathing parts Body parts bathed by patient: Left arm, Chest, Abdomen, Right upper leg, Left upper leg, Front perineal area, Right lower leg, Left lower leg, Right arm Body parts bathed by helper: Back, Buttocks  Bathing assist Assist Level: Touching or steadying assistance(Pt > 75%)      Upper Body Dressing/Undressing Upper body dressing   What is the patient wearing?: Bra, Pull over shirt/dress Bra - Perfomed by patient: Thread/unthread right bra strap, Thread/unthread left bra strap Bra - Perfomed by helper: Hook/unhook bra (pull down sports bra) Pull over shirt/dress - Perfomed by patient: Thread/unthread right sleeve, Put head through opening, Pull shirt over trunk Pull over shirt/dress - Perfomed by helper: Thread/unthread left sleeve        Upper body assist Assist Level:  (Max assist)      Lower Body Dressing/Undressing Lower body dressing   What is the patient wearing?: Pants, Shoes, Non-skid slipper socks, Underwear Underwear - Performed by patient: Thread/unthread right underwear leg, Thread/unthread left underwear leg Underwear - Performed by helper: Pull underwear up/down Pants- Performed by patient: Thread/unthread right pants leg, Thread/unthread left pants leg Pants- Performed by helper: Pull pants up/down   Non-skid slipper socks- Performed by helper: Don/doff right sock       Shoes - Performed by helper: Don/doff left shoe   AFO - Performed by helper: Don/doff right  AFO      Lower body assist Assist for lower body dressing: Touching or steadying assistance (Pt > 75%)      Toileting Toileting   Toileting steps completed by patient: Performs perineal hygiene, Adjust clothing prior to toileting Toileting steps  completed by helper: Adjust clothing prior to toileting, Performs perineal hygiene, Adjust clothing after toileting Toileting Assistive Devices: Grab bar or rail  Toileting assist Assist level: Touching or steadying assistance (Pt.75%)   Transfers Chair/bed transfer   Chair/bed transfer method: Stand pivot Chair/bed transfer assist level: Moderate assist (Pt 50 - 74%/lift or lower) Chair/bed transfer assistive device: Armrests     Locomotion Ambulation     Max distance: 20' Assist level: Moderate assist (Pt 50 - 74%)   Wheelchair   Type: Manual Max wheelchair distance: 150' Assist Level: Supervision or verbal cues  Cognition Comprehension Comprehension assist level: Follows complex conversation/direction with extra time/assistive device  Expression Expression assist level: Expresses complex ideas: With extra time/assistive device  Social Interaction Social Interaction assist level: Interacts appropriately with others with medication or extra time (anti-anxiety, antidepressant).  Problem Solving Problem solving assist level: Solves complex problems: With extra time  Memory Memory assist level: More than reasonable amount of time    Medical Problem List and Plan: 1. Left hemiparesis and functional deficitssecondary to right basal ganglia infarct with recent right foot fracture  Cont CIR  WHO/PRAFO better fitting, encouraged complete compliance  Fluoxetine started 10/5, increased on 10/11  Contacted Ortho, partial WB RLE. Xray reviewed, displacement of 5th digit, but healing. Per Ortho WBAT in boot and repeat films ~10/19.   Baclofen 5 TID started 10/10, DC'd on 10/11 due to lethargy  Trial tizanidine 2 mg 3 times a day, states this also caused drowsiness  TEE relatively unremarkable with mild AI and MR per report 2. DVT Prophylaxis/Anticoagulation: Pharmaceutical: Lovenox 3. Pain Management/Chronic low back pain: Ice /heat for back with ultram prn.  4. Mood: LCSW to follow  for evaluation and support.  5. Neuropsych: This patient iscapable of making decisions on herown behalf. 6. Skin/Wound Care: routine pressure relief measures. Elevate RLE when seated.  7. Fluids/Electrolytes/Nutrition: Monitor I/O.   BMP within normal range on 10/9  Labs ordered for tomorrow 8. Dyslipidemia: Continue lipitor.  9. HTN: Will resume HCTZ if indicated and will likely need Kdur for supplement.   Controlled 10/15 off BP meds Vitals:   05/18/17 2156 05/19/17 0535  BP:  134/69  Pulse:  65  Resp:  17  Temp: 98.2 F (36.8 C) 98.1 F (36.7 C)  SpO2:  97%   10. ABLA:   Hb 11.4 on 10/9  Labs ordered for tomorrow  Cont to monitor 11. 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  Podiatrist following per daughter 96. Tachypnea/Tachycardia  Resolved 14. Sleep disturbance  Melatonin ordered 10/8  Improving 15. Neurogenic bladder   Bethanechol 5 mg 3 times a day started on 10/11, d/ced 10/15  PVRs ordered  LOS (Days) 13 A FACE TO FACE EVALUATION WAS PERFORMED  Ankit Lorie Phenix 05/19/2017 8:37 AM

## 2017-05-19 NOTE — Progress Notes (Signed)
Physical Therapy Session Note  Patient Details  Name: Kelly Lowe MRN: [id number] Date of Birth: Jan 30, 1951  Today's Date: 05/19/2017 PT Individual Time: 0900-1001, 1531-1616 PT Individual Time Calculation (min): 61 min, 30 min   Short Term Goals: Week 2:  PT Short Term Goal 1 (Week 2): Pt will ambulate 50 ft with Mod A using LRAD PT Short Term Goal 2 (Week 2): Pt will ascend/descend 4 steps with mod assist PT Short Term Goal 3 (Week 2): Pt will perform bed<>chair transfers with min assist  Skilled Therapeutic Interventions/Progress Updates:    Session 1: Pt sitting in recliner upon PT arrival, agreeable to therapy tx and denies pain. Pt performed stand pivot transfers with mod assist from recliner>w/c and w/c<>toilet. Pt requiring mod assist for standing balance to doff/don pants and clean peri area, LOB posteriorly and to the left. Pt standing at the sink with min assist to maintain balance and wash hands. Pt propelled w/c from room>gym with supervision x 150 ft. Pt performed sit<>stands x 8 this session mod to min assist with emphasis on standing without UE support and symmetric weightbearing for L LE NMR. Pt standing without UE support to work on dynamic balance with min assist in order to playTic tac toe 2 x 3 min, using the mirror for feedback to correct posture and correct L lateral lean. Pt transferred from w/c<>mat with mod assist, squat pivot transfer. Pt transferred sitting<>supine with min assist. In sidelying using the powder board pt performed gravity eliminated strengthening for L LE neuro re-ed with focus on knee flexion and extension. In sidelying, therapist performed quadriceps stretch and hip flexor stretch 2 x 30 sec each. Pt left seated in w/c in room at end of session with needs in reach.   Session 2: Pt received from OT, sitting in w/c and agreeable to therapy tx and denies pain. Pt transported to gym in w/c. Pt performed squat pivot transfer from w/c<>mat with min  assist, verbal cues for technique and sequencing. Pt performed sit<>stands x 5 with emphasis on symmetric LE weightbearing, no UE support and postural control once standing, with min assist. Pt standing with RW for UE support performed pre-gait stepping forward/backward with each LE 2 x 5, L ACE wrap for toe clearance. Pt ascended/descended 4 steps using single handrail and mod assist, verbal cues for technique and sequencing. Pt transferred from standing>tall kneeling with UE support with mod assist working on glute activation and balance. Pt transferred from tall kneeling to modified quadruped on elbows supported on bench to encourage L UE weightbearing while reaching up with R UE x 5. Pt left seated in w/c at end of session with needs in reach and sister present.    Therapy Documentation Precautions:  Precautions Precautions: Fall Precaution Comments:   Restrictions Weight Bearing Restrictions: No RLE Weight Bearing: Weight bearing as tolerated RLE Partial Weight Bearing Percentage or Pounds:  (weight only via heel) Other Position/Activity Restrictions: Must be in boot for any weight bearing   See Function Navigator for Current Functional Status.   Therapy/Group: Individual Therapy  Netta Corrigan, PT, DPT 05/19/2017, 9:59 AM

## 2017-05-20 ENCOUNTER — Inpatient Hospital Stay (HOSPITAL_COMMUNITY): Payer: Medicare Other | Admitting: Occupational Therapy

## 2017-05-20 ENCOUNTER — Encounter (HOSPITAL_COMMUNITY): Payer: Medicare Other | Admitting: Psychology

## 2017-05-20 ENCOUNTER — Inpatient Hospital Stay (HOSPITAL_COMMUNITY): Payer: Medicare Other

## 2017-05-20 ENCOUNTER — Inpatient Hospital Stay (HOSPITAL_COMMUNITY): Payer: Medicare Other | Admitting: Physical Therapy

## 2017-05-20 LAB — BASIC METABOLIC PANEL
Anion gap: 9 (ref 5–15)
BUN: 7 mg/dL (ref 6–20)
CALCIUM: 10.1 mg/dL (ref 8.9–10.3)
CO2: 27 mmol/L (ref 22–32)
CREATININE: 0.86 mg/dL (ref 0.44–1.00)
Chloride: 105 mmol/L (ref 101–111)
Glucose, Bld: 98 mg/dL (ref 65–99)
Potassium: 3.5 mmol/L (ref 3.5–5.1)
SODIUM: 141 mmol/L (ref 135–145)

## 2017-05-20 LAB — CBC
HCT: 36.6 % (ref 36.0–46.0)
Hemoglobin: 11.9 g/dL — ABNORMAL LOW (ref 12.0–15.0)
MCH: 32.4 pg (ref 26.0–34.0)
MCHC: 32.5 g/dL (ref 30.0–36.0)
MCV: 99.7 fL (ref 78.0–100.0)
PLATELETS: 171 10*3/uL (ref 150–400)
RBC: 3.67 MIL/uL — AB (ref 3.87–5.11)
RDW: 12.9 % (ref 11.5–15.5)
WBC: 4.7 10*3/uL (ref 4.0–10.5)

## 2017-05-20 LAB — URINE CULTURE

## 2017-05-20 NOTE — Consult Note (Signed)
Neuropsychological Consultation   Patient:   Kelly Lowe   DOB:   05-11-1951  MR Number:  [id number]  Location:  [facility] [address] [place] [facility] [address] [id number] [place] [address] Dept: [facility]: [phone]           Date of Service:   05/20/2017  Start Time:   1 PM End Time:   2 PM  Provider/Observer:  Ilean Skill, Psy.D.       Clinical Neuropsychologist       Billing Code/Service: 74259 4 Units  Chief Complaint:    Kelly Lowe is a 66 year old female with history of hypertension, right foot fractures.  Pt was admitted on 9/29/018 with left sided weakness, right gaze preference and left facial droop.  CT revealed right basal gangial infarct and MRI revealed moderate SVD as well.  Left hemiparesis and sensory deficits, right gaze preference, decreased awareness and reduced information processing speed was noted as well.  The patient has had improving cognitive functioning.  The patient has been coping with loss of motor function and how this has changed her life.  She will be going to live with daughter in [place], [place]  The patient is having sudden onset crying spells, but these are all associated with situation and context of communications.  No sudden onset of laughter.  No crying dissociated from context.  Reason for Service:  Kelly Lowe was referred for a neuropsychological consultation due to coping issues and ongoing efforts for increasing adaptive skills.  Below is the HPI for the current admission.    HPI: Kelly Lowe a 66 y.o.femalewith history of HTN, right foot fractures 03/2017; who was admitted on 05/03/17 with left sided weakness, right gaze preference and left facial droop. History taken from chart review, patient, and sister. CT head reviewed, showing right basal gangial infarct. Per report, age indeterminate right caudate infarct and CTA head/neck revealed  intracranial atherosclerosis involving R>L PCA.MRI brain done revealing Non-hemorrhagic infarct in right basal ganglia, punctate infarct in left splenium and moderate underlying small vessel disease.2 D echo done revealing EF 65-70% with no wall abnormality, mild MVR, TVR and PVR. Hip films done due to reports of fall and back pain and showed DDD and no hip fracture or dislocation.   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 Dr. Leonie Man recommends ASA for embolic stroke due to unknown source and will need TEE/loop. TCD without hits or evidence of PFO. Patient with resultant deficits in mobility and self care tasks due to dense left hemiparesis with sensory deficits, right gaze preference, decrease awareness of deficits as well as delayed processing due to higher level deficits. CIR recommended for follow up therapy.   Current Status:  The patient had good mental status exam today and improving global cognitive functioning.  Her thinking was clear but she is having sudden onset of crying spells.  There were still issues of overall information processing speed but expressive and receptive language function was in tact  And short and long term memory.  Behavioral Observation: Kelly Lowe  presents as a 66 y.o.-year-old Right African American Female who appeared her stated age. her dress was Appropriate and she was Well Groomed and her manners were Appropriate to the situation.  her participation was indicative of Appropriate and Attentive behaviors.  There were physical disabilities noted related  to left hemiparesis.  Marland Kitchen  she displayed an appropriate level of cooperation and motivation.     Interactions:    Active Appropriate and Attentive  Attention:   within normal limits and attention span and concentration were age appropriate  Memory:   within normal limits; recent and remote memory  intact  Visuo-spatial:  not examined  Speech (Volume):  low  Speech:   normal; normal  Thought Process:  Coherent and Relevant  Though Content:  WNL; not suicidal  Orientation:   person, place, time/date and situation  Judgment:   Good  Planning:   Good  Affect:    Anxious  Mood:    Anxious  Insight:   Good  Intelligence:   high  Substance Use:  No concerns of substance abuse are reported.    Medical History:   Past Medical History:  Diagnosis Date  . Hypertension        Family Med/Psych History: History reviewed. No pertinent family history.  Risk of Suicide/Violence: virtually non-existent   Impression/DX:  Kelly Lowe is a 66 year old female with history of hypertension, right foot fractures.  Pt was admitted on 9/29/018 with left sided weakness, right gaze preference and left facial droop.  CT revealed right basal gangial infarct and MRI revealed moderate SVD as well.  Left hemiparesis and sensory deficits, right gaze preference, decreased awareness and reduced information processing speed was noted as well.  The patient has had improving cognitive functioning.    The patient had good mental status exam today and improving global cognitive functioning.  There were still issues of overall information processing speed but expressive and receptve language function was in tact  And short and long term memory.  The patient reports increased issues with anxiety and sudden crying events when she talks with family or coworkers.  The patient reports that she does not have these emotional responses out of the blue but when she talks to people and has to deal with what has happened to her.  The patient is having sudden onset crying spells, but these are all associated with situation and context of communications.  No sudden onset of laughter.  No crying dissociated from context.   Disposition/Plan:  Will see the patient again first of next week to continue work on issues  related to anxeity and depressive responses.  Diagnosis:   Right basal ganglia infarction, left hemiparesis  Depressive symptoms         Electronically Signed   _______________________ Ilean Skill, Psy.D.

## 2017-05-20 NOTE — Progress Notes (Signed)
Occupational Therapy Session Note  Patient Details  Name: Kelly Lowe MRN: [id number] Date of Birth: 04/30/1951  Today's Date: 05/20/2017 OT Individual Time: 1959-7471 OT Individual Time Calculation (min): 75 min    Short Term Goals: Week 1:  OT Short Term Goal 1 (Week 1): Pt will complete UB bathing with min assist sitting unsupported.  OT Short Term Goal 1 - Progress (Week 1): Met OT Short Term Goal 2 (Week 1): Pt will complete LB bathing with mod assist sit to stand.  OT Short Term Goal 2 - Progress (Week 1): Met OT Short Term Goal 3 (Week 1): Pt will perform UB dressing with min assist for donning pullover shirt following hemidressing techniques. OT Short Term Goal 3 - Progress (Week 1): Met OT Short Term Goal 4 (Week 1): Pt will perform toilet transfers to drop arm commode squat pivot with min assist.  OT Short Term Goal 4 - Progress (Week 1): Progressing toward goal OT Short Term Goal 5 (Week 1): Pt will return demonstrate AAROM exercises for the LUE following handout with supervision.   OT Short Term Goal 5 - Progress (Week 1): Met Week 2:  OT Short Term Goal 1 (Week 2): Pt will complete toilet transfers with min A.   OT Short Term Goal 2 (Week 2): Pt will complete tub bench transfers with min A. OT Short Term Goal 3 (Week 2): Pt will maintain standing balance with steadying A while pulling pants over hips with min A. OT Short Term Goal 4 (Week 2): Pt will be able to wt shift to her L side with control to allow her to self cleanse post toileting with steadying A. Week 3:     Skilled Therapeutic Interventions/Progress Updates:    1:! Self care retraining at shower level. Pt already up in recliner when arrived. Focus on stand step pivots with maintaining forward weight shifts; recliner to w/c to toilet. Pt continues to present with extensor tone in left LE. Pt ambulated with mod A with RW with hand splint from toilet into shower (backing up to the bath bench) with A to steer  RW. Min A stand pivot transfer out of shower to w/c and transitioned to sink for dressing. Pt able to demonstrate recall of hemi dressing techniques and with extra time successful. Pt able to perform Lb threading with left LE propped over right knee with extra time. Sit to stand with min A but required mod A at times to maintain due to LOB to the left during clothing management. Ambulated ~30 feet in the hallway with mod A with facilitation to advance left LE without AFO donned.  Transferred into recliner to rest before next therapy with call bell.  Therapy Documentation Precautions:  Precautions Precautions: Fall Precaution Comments:   Restrictions Weight Bearing Restrictions: No RLE Weight Bearing: Weight bearing as tolerated RLE Partial Weight Bearing Percentage or Pounds:  (weight only via heel) Other Position/Activity Restrictions: Must be in boot for any weight bearing Pain:  no reports of pain  See Function Navigator for Current Functional Status.   Therapy/Group: Individual Therapy  [name] [facility] 05/20/2017, 8:44 AM

## 2017-05-20 NOTE — Progress Notes (Signed)
Pt A/O, no noted distress. Melatonin was administered at 2305 r/t pt notes she awakens several times during the night. Previous Melatonin was administered around 2000-21ish. Pt has noted anxiety during night fear of voiding in the bed; tech has taken pt to the bathroom which did not have to void. If is evident patient has some anxiety issues, as well as one daughter cries when providing care. No family stayed the night with pt, she constantly stay on the call light. Sleeping regimen was ineffective r/t pt's phobia of being alone and wetting the bed. Staff will continue to maintain safety and meet pt's needs.

## 2017-05-20 NOTE — Progress Notes (Signed)
Physical Therapy Session Note  Patient Details  Name: Kelly Lowe MRN: [id number] Date of Birth: 1950/10/03  Today's Date: 05/20/2017 PT Individual Time: 1430-1530 PT Individual Time Calculation (min): 60 min   Short Term Goals: Week 2:  PT Short Term Goal 1 (Week 2): Pt will ambulate 50 ft with Mod A using LRAD PT Short Term Goal 2 (Week 2): Pt will ascend/descend 4 steps with mod assist PT Short Term Goal 3 (Week 2): Pt will perform bed<>chair transfers with min assist  Skilled Therapeutic Interventions/Progress Updates:    Pt sitting in recliner upon PT arrival, agreeable to therapy tx and denies pain. Pt transferred from recliner>w/c squat pivot with min assist, verbal cues for technique. Pt transported to gym total assist in w/c. Pt performed 1 x 5 sit to stands with focus on symmetric LE weightbearing and no UE support for L neuro re-ed and dynamic balance, verbal cues for technique. Pt standing with RW working on pregait in place using L LE ACE wrap for foot clearance, focus on limiting scissoring with each step on L. Pt ambulated x 30 ft using R handrail and min assist, x 42 ft with hemi-walker and mod assist, L ACE wrap for foot clearance, verbal cues for sequencing. Pt transferred from w/c>recliner, squat pivot with min assist. Pt left in recliner at end of session with needs in reach.   Therapy Documentation Precautions:  Precautions Precautions: Fall Precaution Comments:   Restrictions Weight Bearing Restrictions: No RLE Weight Bearing: Weight bearing as tolerated RLE Partial Weight Bearing Percentage or Pounds:  (weight only via heel) Other Position/Activity Restrictions: Must be in boot for any weight bearing   See Function Navigator for Current Functional Status.   Therapy/Group: Individual Therapy  Netta Corrigan, PT, DPT 05/20/2017, 3:02 PM

## 2017-05-20 NOTE — Progress Notes (Signed)
Physical Therapy Session Note  Patient Details  Name: Kelly Lowe MRN: [id number] Date of Birth: 09/10/1950  Today's Date: 05/20/2017 PT Individual Time: 1100-1202 PT Individual Time Calculation (min): 62 min   Short Term Goals: Week 2:  PT Short Term Goal 1 (Week 2): Pt will ambulate 50 ft with Mod A using LRAD PT Short Term Goal 2 (Week 2): Pt will ascend/descend 4 steps with mod assist PT Short Term Goal 3 (Week 2): Pt will perform bed<>chair transfers with min assist  Skilled Therapeutic Interventions/Progress Updates:   Pt in recliner upon arrival and agreeable to therapy, no c/o pain. Worked on gait training during first part of session using RW and LUE hand splint attachment. Pt ambulated 40' total w/ Mod A overall for balance and LLE step placement, however Max A to correct 1 LOB to left side. Total A w/c mobility to gym for time management. Transferred w/c to mat w/ min A and worked on LLE strengthening exercises as detailed below in supine and in seated at edge of mat.   LLE strengthening exercises: -SAQs 2x10  -assisted SLR 2x10  -assisted heel slides 1x10  -abduction slides 1x10  -assisted LAQs in seated 1x10   Set-up NMES to perform on L quad musculature for increased muscle activation, however unable to achieve desired response w/ NMES this date possibly due to body habitus and location of electrodes. Will continue to attempt for increased L quad activation in order to improve safety and quality of gait. Returned to room, Total A via w/c and transferred to recliner.   Ended session in recliner, call bell within reach and all needs met.   Therapy Documentation Precautions:  Precautions Precautions: Fall Precaution Comments:   Restrictions Weight Bearing Restrictions: No RLE Weight Bearing: Weight bearing as tolerated RLE Partial Weight Bearing Percentage or Pounds:  (weight only via heel) Other Position/Activity Restrictions: Must be in boot for any weight  bearing  See Function Navigator for Current Functional Status.   Therapy/Group: Individual Therapy  Amy K Arnette 05/20/2017, 2:04 PM  

## 2017-05-20 NOTE — Progress Notes (Addendum)
Social Work Patient ID: Kelly Lowe, female   DOB: 26-Feb-1951, 66 y.o.   MRN: [id number]  Met with pt and daughter-Lari-who is here to participate in therapiesand begin family education With pt in preparation for discharge next Wed. Her main goal is making sure Mom gets what she needs and can continue to progress in her therapies. She is pleased with obtaining the k-pad and seeing Dr.Rodenbough with her today. She has many questions regarding prognosis, medications going home on, medical issues she needs to look out for, etc. While she is here Asked her to participate in therapies and begin hands on care. Will discuss with team. Daughter thought everyone had family meeting prior to discharge. Can have one if she requests one. Feel once she begins family educations her questions will be answered along with access to MD. Will follow up with regarding family conference for next week.

## 2017-05-20 NOTE — Progress Notes (Signed)
[facility] PHYSICAL MEDICINE & REHABILITATION     PROGRESS NOTE  Subjective/Complaints:  Pt seen sitting up in her chair this AM.  She states she slept well overnight.  Per nursing, pt anxious and afraid to be alone.  PVRs pending, discussed with nursing.   ROS: Denies CP, SOB, N/V/D.  Objective: Vital Signs: Blood pressure 140/70, pulse 64, temperature 98.2 F (36.8 C), temperature source Oral, resp. rate 17, height 5\' 7"  (1.702 m), weight 83.5 kg (184 lb), SpO2 97 %. No results found. No results for input(s): WBC, HGB, HCT, PLT in the last 72 hours. No results for input(s): NA, K, CL, GLUCOSE, BUN, CREATININE, CALCIUM in the last 72 hours.  Invalid input(s): CO CBG (last 3)  No results for input(s): GLUCAP in the last 72 hours.  Wt Readings from Last 3 Encounters:  05/15/17 83.5 kg (184 lb)  05/03/17 87.8 kg (193 lb 8 oz)    Physical Exam:  BP 140/70 (BP Location: Right Arm)   Pulse 64   Temp 98.2 F (36.8 C) (Oral)   Resp 17   Ht 5\' 7"  (1.702 m)   Wt 83.5 kg (184 lb)   SpO2 97%   BMI 28.82 kg/m  Constitutional: She appears well-developedand well-nourished. NAD. HENT: Normocephalicand atraumatic.  Eyes: EOMI. No discharge.  Cardiovascular: RRR. No JVD. Respiratory: No respiratory distress. She has no wheezes.  GI: She exhibits no distension. BS+ Musculoskeletal: She exhibits no edemaor deformity.  Neurological:  Right gaze preference, improving.  Motor: LUE: 1/5 shoulder abduction, 0/5 distally (unchanged) LLE: 3-/5 HF, KE 3/5, 0/5 distally.  Increase in tone, left pectoralis elbow flexion, ankle plantarflexion Mild dysarthria  Follows simple commands.  Insight and awareness into deficits. Skin: Skin is warm.  Psychiatric: Normal mood and behavior  Assessment/Plan: 1. Functional deficits secondary to right basal ganglia infarct with recent right foot fracture which require 3+ hours per day of interdisciplinary therapy in a comprehensive inpatient rehab  setting. Physiatrist is providing close team supervision and 24 hour management of active medical problems listed below. Physiatrist and rehab team continue to assess barriers to discharge/monitor patient progress toward functional and medical goals.  Function:  Bathing Bathing position   Position: Shower  Bathing parts Body parts bathed by patient: Left arm, Chest, Abdomen, Right upper leg, Left upper leg, Front perineal area, Right lower leg, Left lower leg, Right arm Body parts bathed by helper: Back, Buttocks  Bathing assist Assist Level: Touching or steadying assistance(Pt > 75%)      Upper Body Dressing/Undressing Upper body dressing   What is the patient wearing?: Bra, Pull over shirt/dress Bra - Perfomed by patient: Thread/unthread right bra strap, Thread/unthread left bra strap Bra - Perfomed by helper: Hook/unhook bra (pull down sports bra) Pull over shirt/dress - Perfomed by patient: Thread/unthread right sleeve, Put head through opening, Pull shirt over trunk Pull over shirt/dress - Perfomed by helper: Thread/unthread left sleeve        Upper body assist Assist Level:  (Max assist)      Lower Body Dressing/Undressing Lower body dressing   What is the patient wearing?: Pants, Shoes, Non-skid slipper socks, Underwear Underwear - Performed by patient: Thread/unthread right underwear leg, Thread/unthread left underwear leg Underwear - Performed by helper: Pull underwear up/down Pants- Performed by patient: Thread/unthread right pants leg, Thread/unthread left pants leg Pants- Performed by helper: Pull pants up/down   Non-skid slipper socks- Performed by helper: Don/doff right sock       Shoes - Performed  by helper: Don/doff left shoe   AFO - Performed by helper: Don/doff right AFO      Lower body assist Assist for lower body dressing: Touching or steadying assistance (Pt > 75%)      Toileting Toileting   Toileting steps completed by patient: Adjust clothing  prior to toileting, Performs perineal hygiene, Adjust clothing after toileting Toileting steps completed by helper: Adjust clothing prior to toileting, Performs perineal hygiene, Adjust clothing after toileting Toileting Assistive Devices: Grab bar or rail  Toileting assist Assist level: Touching or steadying assistance (Pt.75%)   Transfers Chair/bed transfer   Chair/bed transfer method: Squat pivot Chair/bed transfer assist level: Moderate assist (Pt 50 - 74%/lift or lower) Chair/bed transfer assistive device: Armrests     Locomotion Ambulation     Max distance: 20' Assist level: Moderate assist (Pt 50 - 74%)   Wheelchair   Type: Manual Max wheelchair distance: 150' Assist Level: Supervision or verbal cues  Cognition Comprehension Comprehension assist level: Follows basic conversation/direction with no assist  Expression Expression assist level: Expresses complex 90% of the time/cues < 10% of the time  Social Interaction Social Interaction assist level: Interacts appropriately with others - No medications needed.  Problem Solving Problem solving assist level: Solves complex problems: Recognizes & self-corrects  Memory Memory assist level: Complete Independence: No helper    Medical Problem List and Plan: 1. Left hemiparesis and functional deficitssecondary to right basal ganglia infarct with recent right foot fracture  Cont CIR  WHO/PRAFO better fitting, encouraged complete compliance  Fluoxetine started 10/5, increased on 10/11  Contacted Ortho, partial WB RLE. Xray reviewed, displacement of 5th digit, but healing. Per Ortho WBAT in boot and repeat films ~10/19.   Baclofen 5 TID started 10/10, DC'd on 10/11 due to lethargy  Trial tizanidine 2 mg 3 times a day, states this also caused drowsiness  TEE relatively unremarkable with mild AI and MR per report 2. DVT Prophylaxis/Anticoagulation: Pharmaceutical: Lovenox 3. Pain Management/Chronic low back pain: Ice /heat for back  with ultram prn.  4. Mood: LCSW to follow for evaluation and support.  5. Neuropsych: This patient iscapable of making decisions on herown behalf. 6. Skin/Wound Care: routine pressure relief measures. Elevate RLE when seated.  7. Fluids/Electrolytes/Nutrition: Monitor I/O.   BMP within normal range on 10/9  Labs pending 8. Dyslipidemia: Continue lipitor.  9. HTN: Will resume HCTZ if indicated and will likely need Kdur for supplement.   Controlled 10/16 Vitals:   05/19/17 1353 05/20/17 0548  BP: 118/79 140/70  Pulse: 81 64  Resp: 20 17  Temp: 98.5 F (36.9 C) 98.2 F (36.8 C)  SpO2: 99% 97%   10. ABLA:   Hb 11.4 on 10/9  Labs pending  Cont to monitor 11. 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  Podiatrist following per daughter 53. Tachypnea/Tachycardia  Resolved 14. Sleep disturbance  Melatonin ordered 10/8  Improving 15. Neurogenic bladder   Bethanechol 5 mg 3 times a day started on 10/11, d/ced 10/15  PVRs pending, discussed with nursing  LOS (Days) 14 A FACE TO FACE EVALUATION WAS PERFORMED  Ankit Lorie Phenix 05/20/2017 8:14 AM

## 2017-05-21 ENCOUNTER — Inpatient Hospital Stay (HOSPITAL_COMMUNITY): Payer: Medicare Other

## 2017-05-21 ENCOUNTER — Inpatient Hospital Stay (HOSPITAL_COMMUNITY): Payer: 59 | Admitting: Occupational Therapy

## 2017-05-21 ENCOUNTER — Inpatient Hospital Stay (HOSPITAL_COMMUNITY): Payer: Medicare Other | Admitting: *Deleted

## 2017-05-21 DIAGNOSIS — G8929 Other chronic pain: Secondary | ICD-10-CM

## 2017-05-21 DIAGNOSIS — M545 Low back pain, unspecified: Secondary | ICD-10-CM

## 2017-05-21 DIAGNOSIS — K5900 Constipation, unspecified: Secondary | ICD-10-CM

## 2017-05-21 MED ORDER — DOCUSATE SODIUM 100 MG PO CAPS
100.0000 mg | ORAL_CAPSULE | Freq: Two times a day (BID) | ORAL | Status: DC
  Administered 2017-05-21 – 2017-05-28 (×12): 100 mg via ORAL
  Filled 2017-05-21 (×13): qty 1

## 2017-05-21 NOTE — Progress Notes (Signed)
Occupational Therapy Session Note  Patient Details  Name: Kelly Lowe MRN: [id number] Date of Birth: [birth year]  Today's Date: 05/21/2017 OT Individual Time: 1941-7408 OT Individual Time Calculation (min): 75 min    Short Term Goals: Week 2:  OT Short Term Goal 1 (Week 2): Pt will complete toilet transfers with min A.   OT Short Term Goal 2 (Week 2): Pt will complete tub bench transfers with min A. OT Short Term Goal 3 (Week 2): Pt will maintain standing balance with steadying A while pulling pants over hips with min A. OT Short Term Goal 4 (Week 2): Pt will be able to wt shift to her L side with control to allow her to self cleanse post toileting with steadying A.  Skilled Therapeutic Interventions/Progress Updates:    Pt seen this session for ADL training and family education with her daughter, Eustaquio Maize. Beth actively observed the techniques used today so she could begin more hands on training tomorrow.  Began session working on standing balance with RW for LUE wt bearing and wt shifts right and left.  She ambulated with RW to toilet. She did not have an AFO on and needed cues for small advancements of her L foot. When she takes a large step, her foot crosses inward. Pt sat on toilet for a few minutes but no longer needed to go.  Because pt was tired from decreased sleep last night, she opted to work on stand pivots vs ambulation.  Completed stand pivot from toilet to w/c and then w/c >< tub bench with min A.   On bench, pt bathed self using long sponge for under her R arm and for her feet.  She stood with min A to support balance to wash her bottom.  In shower, she actively flexed elbow to lift her hand onto her lap. Worked on hand over hand guiding with L hand for NMF.   Transferred back to w/c for dressing. She utilized hemi dressing techniques well. She needed cues for set up, but was able to don shirt without A.  Pt worked on crossing L foot over R knee with A to don clothing.  On 3rd  trial she was able to place her leg herself.  Worked on standing balance with steadying A as she managed her pants over hips (did need A with underwear).    Demonstration with repeat demonstration for daughter to assist her mom with a/arom/ guiding for LUE to further improve the trace strength she has in her shoulder and elbow.    Pt in room with dtr with all needs met.  Therapy Documentation Precautions:  Precautions Precautions: Fall Precaution Comments:   Restrictions Weight Bearing Restrictions: No RLE Weight Bearing: Weight bearing as tolerated RLE Partial Weight Bearing Percentage or Pounds:  (weight only via heel) Other Position/Activity Restrictions: Must be in boot for any weight bearing    Pain: Pain Assessment Pain Assessment: 0-10 Pain Score: 2  Pain Location: Back Pain Orientation: Lower Pain Intervention(s): Heat applied (K-PAD) ADL:  See Function Navigator for Current Functional Status.   Therapy/Group: Individual Therapy  [place] 05/21/2017, 10:39 AM

## 2017-05-21 NOTE — Patient Care Conference (Signed)
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 Constipation   . Chronic bilateral low back pain without sciatica   . Neurogenic bladder   . Fracture of metatarsal bone of left foot   . Hemiparesis of left nondominant side ([place]) 05/13/2017  . Cognitive deficits following cerebral infarction 05/13/2017  . Sleep disturbance   . Closed fracture of bone of right foot   . Benign essential HTN   . Slow transit constipation   . Embolic stroke of right basal ganglia ([place]) 05/06/2017  . Tobacco use 05/05/2017  . Cerebral infarction ([place])   . Pain   . Hypokalemia   . Acute blood loss anemia   . CVA (cerebral vascular accident) ([place]) 05/03/2017    Expected Discharge Date: Expected Discharge Date: 05/28/17  Team Members Present: Physician leading conference: Dr. Delice Lesch Social Worker Present: Ovidio Kin, LCSW Nurse Present: Rozetta Nunnery, RN PT Present: Michaelene Song, PT OT Present: Clyda Greener, OT SLP Present: Charolett Bumpers, SLP PPS Coordinator present : Daiva Nakayama, RN, CRRN     Current Status/Progress Goal Weekly Team Focus  Medical   Left hemiparesis and functional deficits secondary to right basal ganglia infarct with recent right foot fracture  Improve mobility, safety, constipation, sleep, neurogenic bladder  See above   Bowel/Bladder   cont b/b; lbm 05/20/17   maintain  assess for changes q shift and prn   Swallow/Nutrition/ Hydration             ADL's   min A for Ub bathing and dressing- Mod A for Lb,min tomod A stand pivot, extensor tone in left LE,Brunstrom I in hand and III in UE  min assist goals overall  ADL retraining, transfer training, fam education NMR for left   Mobility   Mod A overall for all mobility (supervision w/c), gait up to 40' w/ RW and 25' w/ hemiwalker, gait limited by decreased foot clearance and mild L adductor tone   min A gait and transfers   gait training, transfers, balance, L NMR   Communication             Safety/Cognition/ Behavioral Observations            Pain   denied; refused voltaren and prn pain meds; K pad use  <4/10       Skin   CDI  maintain  assess q shift and prn      *See Care Plan and progress notes for long and short-term goals.     Barriers to Discharge  Current Status/Progress Possible Resolutions Date Resolved   Physician    Medical stability;Other (comments);Weight bearing restrictions  neurogenic bladder  See above  Therapies, follow labs, adjusted sleep meds, adjust bowel meds, meds being adjusted per family agreement      Nursing  PT  Home environment access/layout;Inaccessible home environment  6 stairs - daughter considering getting lift chair for stairway in garage               OT                  SLP                SW                Discharge Planning/Teaching Needs:  Daughter here to begin hands on training, putting a stair lift into the home for outside steps.      Team Discussion:  Progressing toward her goals of min assist level. Will re-x-ray foot by 10/19. Going up and down stairs now-daughter still plans stair lift into home. Neuro-psych seeing regularly. Daughter here to begin hands on care.   Revisions to Treatment Plan:  DC 10/24    Continued Need for Acute Rehabilitation Level of Care: The patient requires daily medical management by a  physician with specialized training in physical medicine and rehabilitation for the following conditions: Daily direction of a multidisciplinary physical rehabilitation program to ensure safe treatment while eliciting the highest outcome that is of practical value to the patient.: Yes Daily medical management of patient stability for increased activity during participation in an intensive rehabilitation regime.: Yes Daily analysis of laboratory values and/or radiology reports with any subsequent need for medication adjustment of medical intervention for : Neurological problems;Blood pressure problems;Other;Urological problems  Kaleem Sartwell, [place] Rhyme 05/21/2017, 3:12 PM

## 2017-05-21 NOTE — Progress Notes (Signed)
Physical Therapy Weekly Progress Note  Patient Details  Name: Kelly Lowe MRN: [id number] Date of Birth: [birth year]  Beginning of progress report period: May 14, 2017 End of progress report period: May 21, 2017  Today's Date: 05/21/2017 PT Individual Time: 1300-1400 PT Individual Time Calculation (min): 60 min   Patient has met 3 of 4 short term goals. Pt improving with overall mobility, continues to be limited by L hemiplegia, dynamic balance and postural control. Pt is ambulating up to 50 ft using hemiwalker and mod assist. Pt with increased posterior lean during sit<>stands, improved with mass practice but minimal carryover day to day.    Patient continues to demonstrate the following deficits muscle weakness, impaired timing and sequencing, abnormal tone, decreased coordination and decreased motor planning, decreased midline orientation and decreased attention to left and decreased standing balance, decreased postural control and decreased balance strategies and therefore will continue to benefit from skilled PT intervention to increase functional independence with mobility.  Patient progressing toward long term goals..  Continue plan of care.  PT Short Term Goals Week 2:  PT Short Term Goal 1 (Week 2): Pt will ambulate 50 ft with Mod A using LRAD PT Short Term Goal 1 - Progress (Week 2): Met PT Short Term Goal 2 (Week 2): Pt will ascend/descend 4 steps with mod assist PT Short Term Goal 2 - Progress (Week 2): Met PT Short Term Goal 3 (Week 2): Pt will perform bed<>chair transfers with min assist PT Short Term Goal 3 - Progress (Week 2): Partly met (inconsistantly ) Week 3:  PT Short Term Goal 1 (Week 3): STG=LTG due to ELOS  Skilled Therapeutic Interventions/Progress Updates:  Ambulation/gait training;Discharge planning;Functional mobility training;Therapeutic Activities;Psychosocial support;Visual/perceptual remediation/compensation;Balance/vestibular  training;Neuromuscular re-education;Therapeutic Exercise;Wheelchair propulsion/positioning;Cognitive remediation/compensation;DME/adaptive equipment instruction;Pain management;Splinting/orthotics;UE/LE Strength taining/ROM;Community reintegration;Functional electrical stimulation;Patient/family education;Stair training;UE/LE Coordination activities   Pt sitting in w/c upon PT arrival, agreeable to therapy tx and denies pain. Pt transported to gym total assist. Orthotist present for orthotic consult. Pt ambulated x 35 ft and x 50 ft using hemi walker and L AFO with mod assist, verbal cues for step length, sequencing and foot placement. Pt ascended/descended 4 steps using R handrail and mod assist, verbal cues for technique. Pt performed 1 x 10 sit to stands for L NMR with emphasis on symmetric LE weightbearing, verbal cues " Nose over toes" with both sitting and standing. Pt left in w/c at end of session with needs in reach.    Therapy Documentation Precautions:  Precautions Precautions: Fall Precaution Comments:   Restrictions Weight Bearing Restrictions: No RLE Weight Bearing: Weight bearing as tolerated RLE Partial Weight Bearing Percentage or Pounds:  (weight only via heel) Other Position/Activity Restrictions: Must be in boot for any weight bearing   See Function Navigator for Current Functional Status.  Therapy/Group: Individual Therapy  Netta Corrigan, PT, DPT 05/21/2017, 2:05 PM

## 2017-05-21 NOTE — Progress Notes (Signed)
Recreational Therapy Session Note  Patient Details  Name: Kelly Lowe MRN: [id number] Date of Birth: [birth year] Today's Date: 05/21/2017  Pain: no c/o Skilled Therapeutic Interventions:  Session focused on discharge planning, use of leisure time, community pursuits, activity analysis with potential modifications.  Pt stated some anxiety about community pursuits, especially those involving eating.  Discussed session with pt's daughter as well. [place] 05/21/2017, 1:44 PM

## 2017-05-21 NOTE — Progress Notes (Signed)
[facility] PHYSICAL MEDICINE & REHABILITATION     PROGRESS NOTE  Subjective/Complaints:  Pt seen sitting up in her chair this AM.  She did not sleep well overnight due to abdominal cramping.  Daughter also notes patient takes naps in the early evenings.    ROS: Denies CP, SOB, N/V/D.  Objective: Vital Signs: Blood pressure 126/71, pulse 69, temperature 98.5 F (36.9 C), temperature source Oral, resp. rate 18, height 5\' 7"  (1.702 m), weight 83.5 kg (184 lb), SpO2 95 %. No results found.  Recent Labs  05/20/17 0744  WBC 4.7  HGB 11.9*  HCT 36.6  PLT 171    Recent Labs  05/20/17 0744  NA 141  K 3.5  CL 105  GLUCOSE 98  BUN 7  CREATININE 0.86  CALCIUM 10.1   CBG (last 3)  No results for input(s): GLUCAP in the last 72 hours.  Wt Readings from Last 3 Encounters:  05/15/17 83.5 kg (184 lb)  05/03/17 87.8 kg (193 lb 8 oz)    Physical Exam:  BP 126/71 (BP Location: Right Arm)   Pulse 69   Temp 98.5 F (36.9 C) (Oral)   Resp 18   Ht 5\' 7"  (1.702 m)   Wt 83.5 kg (184 lb)   SpO2 95%   BMI 28.82 kg/m  Constitutional: She appears well-developedand well-nourished. NAD. HENT: Normocephalicand atraumatic.  Eyes: EOMI. No discharge.  Cardiovascular: RRR. No JVD. Respiratory: No respiratory distress. She has no wheezes.  GI: She exhibits no distension. BS+ Musculoskeletal: She exhibits no edemaor deformity.  Neurological:  Right gaze preference, improving.  Motor: LUE: 1/5 shoulder abduction, 0/5 distally (stable) LLE: 3+/5 HF, 3-/5 KE 3/5, 0/5 distally.  Increase in tone, left elbow flexion, ankle plantarflexion Mild dysarthria  Follows simple commands.  Insight and awareness into deficits. Skin: Skin is warm.  Psychiatric: Normal mood and behavior  Assessment/Plan: 1. Functional deficits secondary to right basal ganglia infarct with recent right foot fracture which require 3+ hours per day of interdisciplinary therapy in a comprehensive inpatient rehab  setting. Physiatrist is providing close team supervision and 24 hour management of active medical problems listed below. Physiatrist and rehab team continue to assess barriers to discharge/monitor patient progress toward functional and medical goals.  Function:  Bathing Bathing position   Position: Shower  Bathing parts Body parts bathed by patient: Left arm, Chest, Abdomen, Right upper leg, Left upper leg, Front perineal area, Right lower leg, Left lower leg, Right arm Body parts bathed by helper: Back, Buttocks  Bathing assist Assist Level: Touching or steadying assistance(Pt > 75%)      Upper Body Dressing/Undressing Upper body dressing   What is the patient wearing?: Bra, Pull over shirt/dress Bra - Perfomed by patient: Thread/unthread right bra strap, Thread/unthread left bra strap Bra - Perfomed by helper: Hook/unhook bra (pull down sports bra) Pull over shirt/dress - Perfomed by patient: Thread/unthread right sleeve, Put head through opening, Pull shirt over trunk Pull over shirt/dress - Perfomed by helper: Thread/unthread left sleeve        Upper body assist Assist Level:  (Max assist)      Lower Body Dressing/Undressing Lower body dressing   What is the patient wearing?: Pants, Shoes, Non-skid slipper socks, Underwear Underwear - Performed by patient: Thread/unthread right underwear leg, Thread/unthread left underwear leg Underwear - Performed by helper: Pull underwear up/down Pants- Performed by patient: Thread/unthread right pants leg, Thread/unthread left pants leg Pants- Performed by helper: Pull pants up/down   Non-skid slipper  socks- Performed by helper: Don/doff right sock       Shoes - Performed by helper: Don/doff left shoe   AFO - Performed by helper: Don/doff right AFO      Lower body assist Assist for lower body dressing: Touching or steadying assistance (Pt > 75%)      Toileting Toileting   Toileting steps completed by patient: Adjust clothing  prior to toileting, Performs perineal hygiene, Adjust clothing after toileting Toileting steps completed by helper: Adjust clothing prior to toileting, Performs perineal hygiene, Adjust clothing after toileting Toileting Assistive Devices: Grab bar or rail  Toileting assist Assist level: Touching or steadying assistance (Pt.75%)   Transfers Chair/bed transfer   Chair/bed transfer method: Squat pivot Chair/bed transfer assist level: Touching or steadying assistance (Pt > 75%) Chair/bed transfer assistive device: Armrests     Locomotion Ambulation     Max distance: 42 ft Assist level: Moderate assist (Pt 50 - 74%)   Wheelchair   Type: Manual Max wheelchair distance: 150' Assist Level: Supervision or verbal cues  Cognition Comprehension Comprehension assist level: Follows basic conversation/direction with no assist  Expression Expression assist level: Expresses complex 90% of the time/cues < 10% of the time  Social Interaction Social Interaction assist level: Interacts appropriately with others - No medications needed.  Problem Solving Problem solving assist level: Solves complex problems: Recognizes & self-corrects  Memory Memory assist level: Complete Independence: No helper    Medical Problem List and Plan: 1. Left hemiparesis and functional deficitssecondary to right basal ganglia infarct with recent right foot fracture  Cont CIR  WHO/PRAFO better fitting, encouraged complete compliance  Fluoxetine started 10/5, increased on 10/11  Contacted Ortho, partial WB RLE. Xray reviewed, displacement of 5th digit, but healing. Per Ortho WBAT in boot and repeat films ~10/19.   Baclofen 5 TID started 10/10, DC'd on 10/11 due to lethargy  Trial tizanidine 2 mg 3 times a day, states this also caused drowsiness  TEE relatively unremarkable with mild AI and MR per report 2. DVT Prophylaxis/Anticoagulation: Pharmaceutical: Lovenox 3. Pain Management/Chronic low back pain: Ice /heat for  back with ultram prn.   Kpad helps. 4. Mood: LCSW to follow for evaluation and support.  5. Neuropsych: This patient iscapable of making decisions on herown behalf. 6. Skin/Wound Care: routine pressure relief measures. Elevate RLE when seated.  7. Fluids/Electrolytes/Nutrition: Monitor I/O.   BMP within normal range on 10/16 8. Dyslipidemia: Continue lipitor.  9. HTN: Will resume HCTZ if indicated and will likely need Kdur for supplement.   Controlled 10/17 Vitals:   05/20/17 1433 05/21/17 0527  BP: 130/72 126/71  Pulse: 65 69  Resp: 18 18  Temp: 98.4 F (36.9 C) 98.5 F (36.9 C)  SpO2: 96% 95%   10. ABLA:   Hb 11.9 on 10/16  Cont to monitor 11. Constipation  Bowel reg increased on 10/3, Senna-s d/ced 10/17  Colace 100 BID started on 10/17  Improving 12. 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  Podiatrist following per daughter 11. Tachypnea/Tachycardia  Resolved 14. Sleep disturbance  Melatonin ordered 10/8  Encouraged improved sleep hygience 15. Neurogenic bladder   Bethanechol 5 mg 3 times a day started on 10/11, d/ced 10/15  PVRs with no residual  Will consider anti-spasmodic if patient/daughter interested (per report, currently feel she is being prescribed too many medications for too many conditions)  LOS (Days) 15 A FACE TO FACE EVALUATION WAS PERFORMED  Massimo Hartland Lorie Phenix 05/21/2017 10:23 AM

## 2017-05-21 NOTE — Progress Notes (Signed)
Occupational Therapy Session Note  Patient Details  Name: Kelly Lowe MRN: [id number] Date of Birth: 21-Sep-1950  Today's Date: 05/21/2017 OT Individual Time: 1440-1536 OT Individual Time Calculation (min): 56 min    Short Term Goals: Week 2:  OT Short Term Goal 1 (Week 2): Pt will complete toilet transfers with min A.   OT Short Term Goal 2 (Week 2): Pt will complete tub bench transfers with min A. OT Short Term Goal 3 (Week 2): Pt will maintain standing balance with steadying A while pulling pants over hips with min A. OT Short Term Goal 4 (Week 2): Pt will be able to wt shift to her L side with control to allow her to self cleanse post toileting with steadying A.  Skilled Therapeutic Interventions/Progress Updates:    Pt completed stand pivot transfers from wheelchair to therapy mat with mod assist stand pivot.  Worked on NMES and neuro re-education to the LUE during session.  Therapist applied electrical stimulation to the left digit flexors and extensors.  Intensity placed on level 24 for extensors and level 25 for flexors.  Index and middle finger buddy taped to increased DIP flexion.  Pt tolerated 25 mins of active stimulation with no adverse reactions.  Custom program used with alternating stimulation at 35 PPS rate and 300 pulse width.  On time 10 seconds for each side.  While stimulation was active, encouraged pt to work on assisting with digit flexion and extension, also had pt work on small movements of elbow extension and flexion as well.  Finished session with transfer back to the wheelchair with mod assist and then therapist rolled her back to the room.  Pt educated on AAROM exercises for the left shoulder, elbow, and digits to continue working on for homework.  Call button and phone in reach.    Therapy Documentation Precautions:  Precautions Precautions: Fall Precaution Comments:   Restrictions Weight Bearing Restrictions: No RLE Weight Bearing: Weight bearing as  tolerated RLE Partial Weight Bearing Percentage or Pounds:  (weight only via heel) Other Position/Activity Restrictions: Must be in boot for any weight bearing  Pain: Pain Assessment Pain Assessment: No/denies pain ADL: See Function Navigator for Current Functional Status.   Therapy/Group: Individual Therapy  Peighton Mehra OTR/L 05/21/2017, 3:55 PM

## 2017-05-22 ENCOUNTER — Inpatient Hospital Stay (HOSPITAL_COMMUNITY): Payer: Medicare Other | Admitting: Physical Therapy

## 2017-05-22 ENCOUNTER — Inpatient Hospital Stay (HOSPITAL_COMMUNITY): Payer: 59 | Admitting: Occupational Therapy

## 2017-05-22 NOTE — Progress Notes (Signed)
Orthopedic Tech Progress Note Patient Details:  Kelly Lowe 04-Nov-1950 [id number] Brace order completed by [employer]. Patient ID: [name], female   DOB: Mar 07, 1951, 66 y.o.   MRN: [id number]   Braulio Bosch 05/22/2017, 3:22 PM

## 2017-05-22 NOTE — Progress Notes (Addendum)
Social Work Patient ID: Kelly Lowe, female   DOB: Aug 22, 1950, 66 y.o.   MRN: [id number]  Met with pt and daughter-lari who is here to discuss team conference progress toward her goals and discharge still 10/24. Both are pleased with how well she is doing and can see the progress in her therapies. Daughter has been participating in hands on therapies with team. Extended family coming in Sun to work with regular PT-Emily. Discussed if still wanted and family conference she is interested in meeting with MD and Soil scientist on Monday. Have scheduled this for Monday at 10:00, all aware. Will continue to work on discharge needs. [name] has gone home and will be back on Sunday.

## 2017-05-22 NOTE — Progress Notes (Signed)
At 2130, PRN trazodone given along with scheduled melatonin, per patient request. Reports resting better, but feeling "a little groggy" this morning. Kelly Lowe A

## 2017-05-22 NOTE — Progress Notes (Signed)
[facility] PHYSICAL MEDICINE & REHABILITATION     PROGRESS NOTE  Subjective/Complaints:  Pt seen sitting up in her chair this AM.  She slept much better overnight, but daughter states pt wakes up to urinate.  Encouraged decrease in fluid intake prior to bedtime.   ROS: Denies CP, SOB, N/V/D.  Objective: Vital Signs: Blood pressure 126/69, pulse 63, temperature 98 F (36.7 C), temperature source Oral, resp. rate 18, height 5\' 7"  (1.702 m), weight 83.5 kg (184 lb), SpO2 95 %. No results found.  Recent Labs  05/20/17 0744  WBC 4.7  HGB 11.9*  HCT 36.6  PLT 171    Recent Labs  05/20/17 0744  NA 141  K 3.5  CL 105  GLUCOSE 98  BUN 7  CREATININE 0.86  CALCIUM 10.1   CBG (last 3)  No results for input(s): GLUCAP in the last 72 hours.  Wt Readings from Last 3 Encounters:  05/15/17 83.5 kg (184 lb)  05/03/17 87.8 kg (193 lb 8 oz)    Physical Exam:  BP 126/69 (BP Location: Right Arm)   Pulse 63   Temp 98 F (36.7 C) (Oral)   Resp 18   Ht 5\' 7"  (1.702 m)   Wt 83.5 kg (184 lb)   SpO2 95%   BMI 28.82 kg/m  Constitutional: She appears well-developedand well-nourished. NAD. HENT: Normocephalicand atraumatic.  Eyes: EOMI. No discharge.  Cardiovascular: RRR. No JVD. Respiratory: No respiratory distress. She has no wheezes.  GI: She exhibits no distension. BS+ Musculoskeletal: She exhibits no edemaor deformity.  Neurological:  Right gaze preference, improving.  Motor: LUE: 1/5 shoulder abduction, 0/5 distally (unchanged) LLE: 3+/5 HF, 3-/5 KE 3/5, 0/5 distally.  Tone increasing, left elbow flexion, ankle plantarflexion Mild dysarthria  Follows simple commands.  Insight and awareness into deficits. Skin: Skin is warm.  Psychiatric: Normal mood and behavior  Assessment/Plan: 1. Functional deficits secondary to right basal ganglia infarct with recent right foot fracture which require 3+ hours per day of interdisciplinary therapy in a comprehensive inpatient  rehab setting. Physiatrist is providing close team supervision and 24 hour management of active medical problems listed below. Physiatrist and rehab team continue to assess barriers to discharge/monitor patient progress toward functional and medical goals.  Function:  Bathing Bathing position   Position: Shower  Bathing parts Body parts bathed by patient: Left arm, Chest, Abdomen, Right upper leg, Left upper leg, Front perineal area, Right lower leg, Left lower leg, Right arm, Buttocks Body parts bathed by helper: Back, Buttocks  Bathing assist Assist Level: Touching or steadying assistance(Pt > 75%)      Upper Body Dressing/Undressing Upper body dressing   What is the patient wearing?: Bra, Pull over shirt/dress Bra - Perfomed by patient: Thread/unthread right bra strap, Thread/unthread left bra strap Bra - Perfomed by helper: Hook/unhook bra (pull down sports bra) Pull over shirt/dress - Perfomed by patient: Thread/unthread right sleeve, Put head through opening, Pull shirt over trunk, Thread/unthread left sleeve Pull over shirt/dress - Perfomed by helper: Thread/unthread left sleeve        Upper body assist Assist Level:  (Max assist)      Lower Body Dressing/Undressing Lower body dressing   What is the patient wearing?: Pants, Shoes, Non-skid slipper socks, Underwear Underwear - Performed by patient: Thread/unthread right underwear leg, Thread/unthread left underwear leg Underwear - Performed by helper: Pull underwear up/down Pants- Performed by patient: Thread/unthread right pants leg, Thread/unthread left pants leg, Pull pants up/down Pants- Performed by helper: Pull  pants up/down Non-skid slipper socks- Performed by patient: Don/doff right sock (R sock only) Non-skid slipper socks- Performed by helper: Don/doff right sock     Shoes - Performed by patient: Don/doff left shoe (L shoe only) Shoes - Performed by helper: Don/doff left shoe   AFO - Performed by helper:  Don/doff right AFO      Lower body assist Assist for lower body dressing: Touching or steadying assistance (Pt > 75%)      Toileting Toileting   Toileting steps completed by patient: Adjust clothing prior to toileting, Performs perineal hygiene, Adjust clothing after toileting Toileting steps completed by helper: Adjust clothing prior to toileting, Performs perineal hygiene, Adjust clothing after toileting Toileting Assistive Devices: Grab bar or rail  Toileting assist Assist level: Touching or steadying assistance (Pt.75%)   Transfers Chair/bed transfer   Chair/bed transfer method: Squat pivot Chair/bed transfer assist level: Touching or steadying assistance (Pt > 75%) Chair/bed transfer assistive device: Armrests     Locomotion Ambulation     Max distance: 50 ft Assist level: Moderate assist (Pt 50 - 74%)   Wheelchair   Type: Manual Max wheelchair distance: 150' Assist Level: Supervision or verbal cues  Cognition Comprehension Comprehension assist level: Follows basic conversation/direction with no assist  Expression Expression assist level: Expresses complex 90% of the time/cues < 10% of the time  Social Interaction Social Interaction assist level: Interacts appropriately with others - No medications needed.  Problem Solving Problem solving assist level: Solves complex problems: Recognizes & self-corrects  Memory Memory assist level: Complete Independence: No helper    Medical Problem List and Plan: 1. Left hemiparesis and functional deficitssecondary to right basal ganglia infarct with recent right foot fracture  Cont CIR  WHO/PRAFO better fitting, encouraged complete compliance  Fluoxetine started 10/5, increased on 10/11  Contacted Ortho, partial WB RLE. Xray reviewed, displacement of 5th digit, but healing. Per Ortho WBAT in boot and repeat films ~10/19.   Baclofen 5 TID started 10/10, DC'd on 10/11 due to lethargy  Trial tizanidine 2 mg 3 times a day, states  this also caused drowsiness  TEE relatively unremarkable with mild AI and MR per report 2. DVT Prophylaxis/Anticoagulation: Pharmaceutical: Lovenox 3. Pain Management/Chronic low back pain: Ice /heat for back with ultram prn.   Kpad helps. 4. Mood: LCSW to follow for evaluation and support.  5. Neuropsych: This patient iscapable of making decisions on herown behalf. 6. Skin/Wound Care: routine pressure relief measures. Elevate RLE when seated.  7. Fluids/Electrolytes/Nutrition: Monitor I/O.   BMP within normal range on 10/16 8. Dyslipidemia: Continue lipitor.  9. HTN: Will resume HCTZ if indicated.   Controlled 10/18 Vitals:   05/21/17 1540 05/22/17 0539  BP: 130/76 126/69  Pulse: 78 63  Resp: 17 18  Temp: 98.3 F (36.8 C) 98 F (36.7 C)  SpO2: 98% 95%   10. ABLA:   Hb 11.9 on 10/16  Cont to monitor 11. Constipation  Bowel reg increased on 10/3, Senna-s d/ced 10/17  Colace 100 BID started on 10/17  Improving 12. 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  Podiatrist following per daughter 1. Tachypnea/Tachycardia  Resolved 14. Sleep disturbance  Melatonin ordered 10/8  Encouraged improved sleep hygiene, again on 10/18 15. Neurogenic bladder   Bethanechol 5 mg 3 times a day started on 10/11, d/ced 10/15  PVRs with no residual  Will consider anti-spasmodic if patient/daughter interested (per report, currently feel she is being prescribed too many medications for too many  conditions)  LOS (Days) 16 A FACE TO FACE EVALUATION WAS PERFORMED  Kelly Lowe Lorie Phenix 05/22/2017 8:12 AM

## 2017-05-22 NOTE — Progress Notes (Signed)
Occupational Therapy Session Note  Patient Details  Name: Kelly Lowe MRN: [id number] Date of Birth: [birth year]  Today's Date: 05/22/2017 OT Individual Time: 7290-2111 OT Individual Time Calculation (min): 75 min    Short Term Goals: Week 2:  OT Short Term Goal 1 (Week 2): Pt will complete toilet transfers with min A.   OT Short Term Goal 2 (Week 2): Pt will complete tub bench transfers with min A. OT Short Term Goal 3 (Week 2): Pt will maintain standing balance with steadying A while pulling pants over hips with min A. OT Short Term Goal 4 (Week 2): Pt will be able to wt shift to her L side with control to allow her to self cleanse post toileting with steadying A.  Skilled Therapeutic Interventions/Progress Updates:    Pt seen for family education with her daughter and ADL training with a focus on balance, hemi techniques.  Pt received in recliner stating she had another difficulty night sleeping.  Spent quite a bit of time discussing strategies she could try to help her sleep and stay asleep through the night.  Because pt was so tired, she requested to bathe at sink vs a shower.  Pt completed stand pivot with hemiwalker with min/mod A to w/c.  Her daughter was actively involved and did well cuing her mother.  Pt worked on sit to stand at sink with cues to maintain a neutral wt shift forward as she pushes towards her R which puts her balance too far to the R as she moves into a stand.  In standing LUE wt bearing on the sink. Pt had slight active L elbow movement.    Her daughter assisted with the stand pivot transfer from w/c to toilet with S from this therapist.  Pt was still sitting on toilet at the end of the session, so NT arrived to assist pt and dtr.   Therapy Documentation Precautions:  Precautions Precautions: Fall Precaution Comments:   Restrictions Weight Bearing Restrictions: No RLE Weight Bearing: Weight bearing as tolerated RLE Partial Weight Bearing Percentage or  Pounds:  (weight only via heel) Other Position/Activity Restrictions: Must be in boot for any weight bearing  Pain: Pain Assessment Pain Assessment: No/denies pain ADL:   See Function Navigator for Current Functional Status.   Therapy/Group: Individual Therapy  [place] 05/22/2017, 12:29 PM

## 2017-05-22 NOTE — Progress Notes (Signed)
Physical Therapy Session Note  Patient Details  Name: Kelly Lowe MRN: [id number] Date of Birth: [birth year]  Today's Date: 05/22/2017 PT Individual Time: 2446-2863 AND 1535-1615 PT Individual Time Calculation (min): 82 min AND 40 min  Short Term Goals: Week 3:  PT Short Term Goal 1 (Week 3): STG=LTG due to ELOS  Skilled Therapeutic Interventions/Progress Updates:   Session 1:  Pt in recliner upon arrival and agreeable to therapy, no c/o pain. Transferred to w/c w/ Min A and Total A w/c mobility to therapy gym. Worked on gait training w/ hemi walker and ace wrap DF assist to LLE.  Ambulated 15' x2, 25' w/ Mod A overall w/ bouts of Min guard for 2-3 steps. Multimodal cues for gait pattern - for L step placement and L foot clearance/swing limb advancement. Transferred to supine on mat to work on gravity eliminated L hip and knee flexion in R sidelying. Goal to improve gait pattern and safety w/ gait as pt has decreased L hip and knee flexion throughout gait pattern. See exercises below. Transferred back to edge of mat and performed dynamic sitting balance while completing UE tasks reaching to edge of BOS - emphasis on trunk rotation and weight-bearing through L side, supervision for balance. Transferred back to w/c and Total A w/c mobility back to room. Ended session in w/c, call bell within reach and all needs met.   Sidelying LLE exercises: -assisted knee flexion 2x10, resisted 2x10  -combined hip and knee flexion, 3x10  -static knee flexion stretch for quad flexibility in sidelying, 4x30 sec  -hip extension, 2x5    Session 2:  Pt in w/c upon arrival and agreeable to therapy, no c/o pain. Pt self-propelled w/c to/from therapy gym w/ supervision using R hemi technique to work on independence w/ functional mobility. Worked on gait training this session w/ LAFO delivered to room this afternoon. Ambulated 25' x3 w/ RW and L hand splint. Pt continues to catch toes on floor due to decreased  hip and knee flexion in swing phase. Increased ease of gait w/ footie on L toes during last gait bout. Returned to room and assisted pt to toilet via stedy and pt performed pericare w/ set-up assist. Ended session in recliner, call bell within reach and all needs met.   Therapy Documentation Precautions:  Precautions Precautions: Fall Precaution Comments:   Restrictions Weight Bearing Restrictions: No RLE Weight Bearing: Weight bearing as tolerated RLE Partial Weight Bearing Percentage or Pounds:  (weight only via heel) Other Position/Activity Restrictions: Must be in boot for any weight bearing Pain: Pain Assessment Pain Assessment: No/denies pain Pain Score: 3   See Function Navigator for Current Functional Status.   Therapy/Group: Individual Therapy  Ranetta Armacost K Arnette 05/22/2017, 2:26 PM

## 2017-05-22 NOTE — Progress Notes (Signed)
Orthopedic Tech Progress Note Patient Details:  Kelly Lowe 11/28/1950 [id number]  Patient ID: [name], female   DOB: 1951-05-27, 66 y.o.   MRN: [id number]   Hildred Priest 05/22/2017, 1:17 PM Called in hanger brace order; spoke with [facility]

## 2017-05-23 ENCOUNTER — Inpatient Hospital Stay (HOSPITAL_COMMUNITY): Payer: Medicare Other | Admitting: Physical Therapy

## 2017-05-23 ENCOUNTER — Inpatient Hospital Stay (HOSPITAL_COMMUNITY): Payer: Medicare Other | Admitting: Occupational Therapy

## 2017-05-23 ENCOUNTER — Inpatient Hospital Stay (HOSPITAL_COMMUNITY): Payer: 59

## 2017-05-23 NOTE — Progress Notes (Signed)
Occupational Therapy Weekly Progress Note  Patient Details  Name: Kelly Lowe MRN: [id number] Date of Birth: 1951-06-22   Today's Date: 05/23/2017 OT Individual Time: 1109-1200 and 1287-8676 OT Individual Time Calculation (min): 51 min and 45 min  Beginning of progress report period: 05/14/17 End of progress report period: 05/23/17  Patient has met 4 of 4 short term goals.    Pt has achieved all of her STGs during this report period. She has improved her functional transfer abilities, going from requiring Mod A during last report period to now completing stand pivot bathroom transfers with Min A. She can also ambulate into the bathroom for toilet transfers. Family education has been initiated and will continue next week prior to pt discharge.   Patient continues to demonstrate the following deficits: muscle weakness, muscle joint tightness and muscle paralysis, decreased cardiorespiratoy endurance, impaired timing and sequencing, abnormal tone, unbalanced muscle activation and decreased coordination, decreased midline orientation and decreased sitting balance, decreased standing balance, decreased postural control, hemiplegia and decreased balance strategies and therefore will continue to benefit from skilled OT intervention to enhance overall performance with BADL.  Patient progressing toward long term goals..  Continue plan of care.  OT Short Term Goals Week 2:  OT Short Term Goal 1 (Week 2): Pt will complete toilet transfers with min A.   OT Short Term Goal 1 - Progress (Week 2): Met OT Short Term Goal 2 (Week 2): Pt will complete tub bench transfers with min A. OT Short Term Goal 2 - Progress (Week 2): Met OT Short Term Goal 3 (Week 2): Pt will maintain standing balance with steadying A while pulling pants over hips with min A. OT Short Term Goal 3 - Progress (Week 2): Met OT Short Term Goal 4 (Week 2): Pt will be able to wt shift to her L side with control to allow her to self  cleanse post toileting with steadying A. OT Short Term Goal 4 - Progress (Week 2): Met Week 3:  OT Short Term Goal 1 (Week 3): STGs=LTGs due to ELOS  Skilled Therapeutic Interventions/Progress Updates:    Tx focus on Lt NMR and adaptive bathing skills.   Pt greeted in w/c, agreeable to complete UB bathing/dressing. Discussed use of bath mitt for bathing next week due to increased muscle activation in Lt arm. There were no bath mitts in supply, so fashioned a makeshift bath mitt using wash cloth and coband Pt encouraged by her ability to actively assist with washing Rt side in gravity-eliminated and gravity-assisted positions. Also exhibited similar abilities when applying lotion. Pt requesting for OT to stretch her L UE in abduction and flexion for increased comfort while she used loofah sponge to bathe the rest of UB. Grooming tasks completed with L UE facilitated as gross stabilizer. At end of tx pt was repositioned for comfort and left in w/c with all needs within reach, anticipating lunch.   2nd Session 1:1 tx (45 min) Tx focus on Lt NMR, trunk control, bilateral integration, and midline orientation during participation in adaptive yoga.   Pt greeted in w/c after lunch, agreeable to tx. Stand pivot<bed completed with SPC and close supervision. While EOB, pt engaged in UB stretching with active assist as needed (and brief thermotherapy per pt request), working core muscles while laterally bending and rotating trunk. Pt pressing hands together at midline and holding for 10 second intervals with tree pose. Pt clasping both hands together while bending forward for sun salutations, cues for activating core  muscles while elevating UB to upright sitting. Lateral bending with pt lowering to each elbow (assist for stabilizing Lt arm) for improved trunk control and mobility of spine. Unsupported bridges x3 in supine, pt able to hold for five breath counts with Lt LE stabilized. Throughout, educated pt on  coordinating breath with movement, emphasis placed on quality of movement vs. quantity. Pt requesting information regarding community yoga classes that would offer adaptive services for continued engagement post d/c. Plans made to bring her these materials this weekend. At end of tx pt completed stand pivot transfer back to w/c in manner as written above. She was left with all needs within reach at time of departure.   Therapy Documentation Precautions:  Precautions Precautions: Fall Precaution Comments:   Restrictions Weight Bearing Restrictions: No RLE Weight Bearing: Weight bearing as tolerated RLE Partial Weight Bearing Percentage or Pounds:  (weight only via heel) Other Position/Activity Restrictions: Must be in boot for any weight bearing General:   Vital Signs: Therapy Vitals Temp: 98.6 F (37 C) Temp Source: Oral Pulse Rate: 61 Resp: 16 BP: 122/70 Patient Position (if appropriate): Sitting Oxygen Therapy SpO2: 98 % O2 Device: Not Delivered Pain: No c/o pain during tx    ADL:      See Function Navigator for Current Functional Status.   Therapy/Group: Individual Therapy  Hubert Raatz A Mashayla Lavin 05/23/2017, 4:28 PM

## 2017-05-23 NOTE — Progress Notes (Signed)
Kelly Lowe PHYSICAL MEDICINE & REHABILITATION     PROGRESS NOTE  Subjective/Complaints:  Pt seen sitting up in her chair this AM.  She returned from xray.  She states she slept better overnight and is doing better overall.   ROS: Denies CP, SOB, N/V/D.  Objective: Vital Signs: Blood pressure 140/78, pulse (!) 58, temperature 97.8 F (36.6 C), temperature source Oral, resp. rate 16, height 5\' 7"  (1.702 m), weight 83.5 kg (184 lb), SpO2 99 %. Dg Foot Complete Right  Result Date: 05/23/2017 CLINICAL DATA:  Fractured metatarsal. EXAM: RIGHT FOOT COMPLETE - 3+ VIEW COMPARISON:  05/08/2015.  03/16/2017 FINDINGS: Angulated display fracture of the right fifth metatarsal again noted. Slight increasing callus formation noted . Diffuse osteopenia degenerative change IMPRESSION: Slight increase in callus formation noted about the right fifth metatarsal fracture. Electronically Signed   By: Marcello Moores  Register   On: 05/23/2017 07:45   No results for input(s): WBC, HGB, HCT, PLT in the last 72 hours. No results for input(s): NA, K, CL, GLUCOSE, BUN, CREATININE, CALCIUM in the last 72 hours.  Invalid input(s): CO CBG (last 3)  No results for input(s): GLUCAP in the last 72 hours.  Wt Readings from Last 3 Encounters:  05/15/17 83.5 kg (184 lb)  05/03/17 87.8 kg (193 lb 8 oz)    Physical Exam:  BP 140/78 (BP Location: Right Arm)   Pulse (!) 58   Temp 97.8 F (36.6 C) (Oral)   Resp 16   Ht 5\' 7"  (1.702 m)   Wt 83.5 kg (184 lb)   SpO2 99%   BMI 28.82 kg/m  Constitutional: She appears well-developedand well-nourished. NAD. HENT: Normocephalicand atraumatic.  Eyes: EOMI. No discharge.  Cardiovascular: RRR. No JVD. Respiratory: No respiratory distress. She has no wheezes.  GI: She exhibits no distension. BS+ Musculoskeletal: She exhibits no edemaor deformity.  Neurological:  Right gaze preference, improving.  Motor: LUE: 1/5 shoulder abduction, 0/5 distally (stable) LLE: 3+/5 HF, 3-/5 KE  3/5, 0/5 distally.  Tone left elbow flexion, ankle plantarflexion Mild dysarthria  Follows simple commands.  Insight and awareness into deficits. Skin: Skin is warm.  Psychiatric: Normal mood and behavior  Assessment/Plan: 1. Functional deficits secondary to right basal ganglia infarct with recent right foot fracture which require 3+ hours per day of interdisciplinary therapy in a comprehensive inpatient rehab setting. Physiatrist is providing close team supervision and 24 hour management of active medical problems listed below. Physiatrist and rehab team continue to assess barriers to discharge/monitor patient progress toward functional and medical goals.  Function:  Bathing Bathing position   Position: Shower  Bathing parts Body parts bathed by patient: Left arm, Chest, Abdomen, Right upper leg, Left upper leg, Front perineal area, Right lower leg, Left lower leg, Right arm, Buttocks Body parts bathed by helper: Back, Buttocks  Bathing assist Assist Level: Touching or steadying assistance(Pt > 75%)      Upper Body Dressing/Undressing Upper body dressing   What is the patient wearing?: Bra, Pull over shirt/dress Bra - Perfomed by patient: Thread/unthread right bra strap, Thread/unthread left bra strap Bra - Perfomed by helper: Hook/unhook bra (pull down sports bra) Pull over shirt/dress - Perfomed by patient: Thread/unthread right sleeve, Put head through opening, Pull shirt over trunk, Thread/unthread left sleeve Pull over shirt/dress - Perfomed by helper: Thread/unthread left sleeve        Upper body assist Assist Level:  (Max assist)      Lower Body Dressing/Undressing Lower body dressing   What is  the patient wearing?: Pants, Shoes, Non-skid slipper socks, Underwear Underwear - Performed by patient: Thread/unthread right underwear leg, Thread/unthread left underwear leg Underwear - Performed by helper: Pull underwear up/down Pants- Performed by patient: Thread/unthread  right pants leg, Thread/unthread left pants leg, Pull pants up/down Pants- Performed by helper: Pull pants up/down Non-skid slipper socks- Performed by patient: Don/doff right sock (R sock only) Non-skid slipper socks- Performed by helper: Don/doff right sock     Shoes - Performed by patient: Don/doff left shoe (L shoe only) Shoes - Performed by helper: Don/doff left shoe   AFO - Performed by helper: Don/doff right AFO      Lower body assist Assist for lower body dressing: Touching or steadying assistance (Pt > 75%)      Toileting Toileting   Toileting steps completed by patient: Adjust clothing prior to toileting, Performs perineal hygiene, Adjust clothing after toileting Toileting steps completed by helper: Adjust clothing prior to toileting, Performs perineal hygiene, Adjust clothing after toileting Toileting Assistive Devices: Grab bar or rail  Toileting assist Assist level: Touching or steadying assistance (Pt.75%)   Transfers Chair/bed transfer   Chair/bed transfer method: Stand pivot Chair/bed transfer assist level: Touching or steadying assistance (Pt > 75%) Chair/bed transfer assistive device: Armrests     Locomotion Ambulation     Max distance: 25' Assist level: Moderate assist (Pt 50 - 74%)   Wheelchair   Type: Manual Max wheelchair distance: 150' Assist Level: Supervision or verbal cues  Cognition Comprehension Comprehension assist level: Follows complex conversation/direction with extra time/assistive device  Expression Expression assist level: Expresses complex 90% of the time/cues < 10% of the time  Social Interaction Social Interaction assist level: Interacts appropriately 90% of the time - Needs monitoring or encouragement for participation or interaction.  Problem Solving Problem solving assist level: Solves basic 90% of the time/requires cueing < 10% of the time  Memory Memory assist level: Recognizes or recalls 90% of the time/requires cueing < 10% of  the time    Medical Problem List and Plan: 1. Left hemiparesis and functional deficitssecondary to right basal ganglia infarct with recent right foot fracture  Cont CIR  WHO/PRAFO better fitting, encouraged complete compliance  Fluoxetine started 10/5, increased on 10/11  Contacted Ortho, partial WB RLE. Xray reviewed, displacement of 5th digit, but healing. Per Ortho WBAT in boot and repeat films 10/19, reviewed, showing slight increase in callous formation around fracture, will contact Ortho regarding boot.   Baclofen 5 TID started 10/10, DC'd on 10/11 due to lethargy  Trial tizanidine 2 mg 3 times a day, states this also caused drowsiness  TEE relatively unremarkable with mild AI and MR per report 2. DVT Prophylaxis/Anticoagulation: Pharmaceutical: Lovenox 3. Pain Management/Chronic low back pain: Ice /heat for back with ultram prn.   Kpad helps. 4. Mood: LCSW to follow for evaluation and support.  5. Neuropsych: This patient iscapable of making decisions on herown behalf. 6. Skin/Wound Care: routine pressure relief measures. Elevate RLE when seated.  7. Fluids/Electrolytes/Nutrition: Monitor I/O.   BMP within normal range on 10/16 8. Dyslipidemia: Continue lipitor.  9. HTN: Will resume HCTZ if indicated.   Controlled 10/19 Vitals:   05/22/17 1454 05/23/17 0550  BP: 118/81 140/78  Pulse: 65 (!) 58  Resp: 18 16  Temp: 98.7 F (37.1 C) 97.8 F (36.6 C)  SpO2: 98% 99%   10. ABLA:   Hb 11.9 on 10/16  Cont to monitor 11. Constipation  Bowel reg increased on 10/3, Senna-s d/ced 10/17  Colace 100 BID started on 10/17  Improving 12. 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  Podiatrist following per daughter 41. Tachypnea/Tachycardia  Resolved 14. Sleep disturbance  Melatonin ordered 10/8  Encouraged improved sleep hygiene, again on 10/18  Improving 15. Neurogenic bladder   Bethanechol 5 mg 3 times a day started on 10/11, d/ced 10/15  PVRs with no  residual  Will consider anti-spasmodic if patient/daughter interested (per report, currently feel she is being prescribed too many medications for too many conditions)  LOS (Days) 17 A FACE TO FACE EVALUATION WAS PERFORMED  Kelly Lowe Lorie Phenix 05/23/2017 9:09 AM

## 2017-05-23 NOTE — Progress Notes (Signed)
Physical Therapy Session Note  Patient Details  Name: Kelly Lowe MRN: [id number] Date of Birth: October 20, 1950  Today's Date: 05/23/2017 PT Individual Time: 1000-1055 AND 1430-1515 PT Individual Time Calculation (min): 55 min AND 45 min   Short Term Goals: Week 3:  PT Short Term Goal 1 (Week 3): STG=LTG due to ELOS  Skilled Therapeutic Interventions/Progress Updates:   Session 1:  Pt in recliner upon arrival and agreeable to therapy, no c/o pain. Per chart review and verbal conversation w/ PA-C, pt is cleared to WBAT through RLE w/o walking boot and using the boot for comfort only. Pt happy about this and transferred to w/c and Total A w/c propulsion to/from gym for time management. Worked on gait training w/o boot and using regular shoe on RLE. Pt ambulated 25' w/ hemi walker and 25' using large-based quad cane. Pt w/ increased postural control and independence w/ balance and balance reactions during gait w/o walking boot. Min A overall for gait using either hemiwalker or large-based quad cane. Worked on sit<>stands w/o UE support during gait rest breaks, performed multiple ranging Min guard to Max A 2/2 pt's posterior lean upon standing. Multimodal cues for technique and to keep a forward lean to prevent posterior LOB. Returned to room and ended session in w/c, call bell within reach and all needs met.   Session 2:  Pt in recliner upon arrival and agreeable to therapy, no c/o pain. Transferred to w/c w/ Mod A and Total A for w/c transport to day room. Performed NuStep 5 min x2 @ L3 for LE strengthening and reciprocal movement pattern. Pt then self-propelled w/c back to room, 250', w/ supervision using R hemi technique to work on functional mobility/independence. Pt w/ continued decreased L attention during functional mobility. Rearranged pt's room to promote L attention/awareness throughout day. Explained this to patient and patient appreciative. Ended session in recliner, call bell within  reach and all needs met.   Therapy Documentation Precautions:  Precautions Precautions: Fall Precaution Comments:   Restrictions Weight Bearing Restrictions: No RLE Weight Bearing: Weight bearing as tolerated RLE Partial Weight Bearing Percentage or Pounds:  (weight only via heel) Other Position/Activity Restrictions: Must be in boot for any weight bearing Pain: Pain Assessment Pain Assessment: 0-10 Pain Score: 0-No pain  See Function Navigator for Current Functional Status.   Therapy/Group: Individual Therapy  Nadir Vasques K Arnette 05/23/2017, 12:01 PM

## 2017-05-23 NOTE — Progress Notes (Signed)
Spoke with orthosis services in regards to review of latest films of right foot. At this time she can continue to be weightbearing as tolerated can be worn for comfort if needed

## 2017-05-24 ENCOUNTER — Inpatient Hospital Stay (HOSPITAL_COMMUNITY): Payer: Medicare Other | Admitting: Occupational Therapy

## 2017-05-24 DIAGNOSIS — G8110 Spastic hemiplegia affecting unspecified side: Secondary | ICD-10-CM | POA: Insufficient documentation

## 2017-05-24 NOTE — Progress Notes (Signed)
Occupational Therapy Session Note  Patient Details  Name: Kelly Lowe MRN: [id number] Date of Birth: 1950/09/29  Today's Date: 05/24/2017 OT Individual Time: 1330-1505 OT Individual Time Calculation (min): 95 min     Skilled Therapeutic Interventions/Progress Updates: patient completed shower in room with overall moderate assistance with focus on sit to stand with proper prep positioning, transfer training, L UE use as stabilizer and hand over hand assist.     Patient will benefit from continued work to increase L UE and LE Neuro reducation for balance and self care.    Patient was left seated in her recliner with call bell and phone within reach     Therapy Documentation Precautions:  Precautions Precautions: Fall Precaution Comments:   Restrictions Weight Bearing Restrictions: No RLE Weight Bearing: Weight bearing as tolerated RLE Partial Weight Bearing Percentage or Pounds:  (weight only via heel) Other Position/Activity Restrictions: Must be in boot for any weight bearing  Pain:denied    Therapy/Group: Individual Therapy  Herschell Dimes 05/24/2017, 5:41 PM

## 2017-05-24 NOTE — Progress Notes (Signed)
[facility] PHYSICAL MEDICINE & REHABILITATION     PROGRESS NOTE  Subjective/Complaints:  Patient seen sitting up in bed this morning. She states she slept well overnight after the change her bed.  ROS: Denies CP, SOB, N/V/D.  Objective: Vital Signs: Blood pressure 133/77, pulse 65, temperature 98.4 F (36.9 C), temperature source Oral, resp. rate 16, height 5\' 7"  (1.702 m), weight 83.5 kg (184 lb), SpO2 95 %. Dg Foot Complete Right  Result Date: 05/23/2017 CLINICAL DATA:  Fractured metatarsal. EXAM: RIGHT FOOT COMPLETE - 3+ VIEW COMPARISON:  05/08/2015.  03/16/2017 FINDINGS: Angulated display fracture of the right fifth metatarsal again noted. Slight increasing callus formation noted . Diffuse osteopenia degenerative change IMPRESSION: Slight increase in callus formation noted about the right fifth metatarsal fracture. Electronically Signed   By: Marcello Moores  Register   On: 05/23/2017 07:45   No results for input(s): WBC, HGB, HCT, PLT in the last 72 hours. No results for input(s): NA, K, CL, GLUCOSE, BUN, CREATININE, CALCIUM in the last 72 hours.  Invalid input(s): CO CBG (last 3)  No results for input(s): GLUCAP in the last 72 hours.  Wt Readings from Last 3 Encounters:  05/15/17 83.5 kg (184 lb)  05/03/17 87.8 kg (193 lb 8 oz)    Physical Exam:  BP 133/77 (BP Location: Right Arm)   Pulse 65   Temp 98.4 F (36.9 C) (Oral)   Resp 16   Ht 5\' 7"  (1.702 m)   Wt 83.5 kg (184 lb)   SpO2 95%   BMI 28.82 kg/m  Constitutional: She appears well-developedand well-nourished. NAD. HENT: Normocephalicand atraumatic.  Eyes: EOMI. No discharge.  Cardiovascular: RRR. No JVD. Respiratory: No respiratory distress. She has no wheezes.  GI: She exhibits no distension. BS+ Musculoskeletal: She exhibits no edemaor deformity.  Neurological:  Right gaze preference, improving.  Motor: LUE: 1/5 shoulder abduction, 0/5 distally (stable) LLE: 3+/5 HF, 3-/5 KE 3/5, 0/5 distally (stable).  Tone  left elbow flexion, ankle plantarflexion Mild dysarthria  Follows simple commands.  Insight and awareness into deficits. Skin: Skin is warm.  Psychiatric: Normal mood and behavior  Assessment/Plan: 1. Functional deficits secondary to right basal ganglia infarct with recent right foot fracture which require 3+ hours per day of interdisciplinary therapy in a comprehensive inpatient rehab setting. Physiatrist is providing close team supervision and 24 hour management of active medical problems listed below. Physiatrist and rehab team continue to assess barriers to discharge/monitor patient progress toward functional and medical goals.  Function:  Bathing Bathing position   Position: Wheelchair/chair at sink  Bathing parts Body parts bathed by patient: Chest, Abdomen Body parts bathed by helper: Right arm, Left arm, Back  Bathing assist Assist Level: Touching or steadying assistance(Pt > 75%)      Upper Body Dressing/Undressing Upper body dressing   What is the patient wearing?: Bra, Pull over shirt/dress Bra - Perfomed by patient: Thread/unthread right bra strap, Thread/unthread left bra strap Bra - Perfomed by helper: Hook/unhook bra (pull down sports bra) Pull over shirt/dress - Perfomed by patient: Thread/unthread right sleeve, Put head through opening, Pull shirt over trunk, Thread/unthread left sleeve Pull over shirt/dress - Perfomed by helper: Thread/unthread left sleeve        Upper body assist Assist Level:  (Max assist)      Lower Body Dressing/Undressing Lower body dressing   What is the patient wearing?: Pants, Shoes, Non-skid slipper socks, Underwear Underwear - Performed by patient: Thread/unthread right underwear leg, Thread/unthread left underwear leg Underwear -  Performed by helper: Pull underwear up/down Pants- Performed by patient: Thread/unthread right pants leg, Thread/unthread left pants leg, Pull pants up/down Pants- Performed by helper: Pull pants  up/down Non-skid slipper socks- Performed by patient: Don/doff right sock (R sock only) Non-skid slipper socks- Performed by helper: Don/doff right sock     Shoes - Performed by patient: Don/doff left shoe (L shoe only) Shoes - Performed by helper: Don/doff left shoe   AFO - Performed by helper: Don/doff right AFO      Lower body assist Assist for lower body dressing: Touching or steadying assistance (Pt > 75%)      Toileting Toileting   Toileting steps completed by patient: Adjust clothing prior to toileting, Performs perineal hygiene, Adjust clothing after toileting Toileting steps completed by helper: Adjust clothing prior to toileting, Performs perineal hygiene, Adjust clothing after toileting Toileting Assistive Devices: Grab bar or rail  Toileting assist Assist level: Touching or steadying assistance (Pt.75%)   Transfers Chair/bed transfer   Chair/bed transfer method: Stand pivot Chair/bed transfer assist level: Moderate assist (Pt 50 - 74%/lift or lower) Chair/bed transfer assistive device: Armrests     Locomotion Ambulation     Max distance: 25' Assist level: Moderate assist (Pt 50 - 74%)   Wheelchair   Type: Manual Max wheelchair distance: 250' Assist Level: Supervision or verbal cues  Cognition Comprehension Comprehension assist level: Follows complex conversation/direction with extra time/assistive device  Expression Expression assist level: Expresses complex 90% of the time/cues < 10% of the time  Social Interaction Social Interaction assist level: Interacts appropriately 90% of the time - Needs monitoring or encouragement for participation or interaction.  Problem Solving Problem solving assist level: Solves basic 90% of the time/requires cueing < 10% of the time  Memory Memory assist level: Recognizes or recalls 90% of the time/requires cueing < 10% of the time    Medical Problem List and Plan: 1. Left hemiparesis and functional deficitssecondary to  right basal ganglia infarct with recent right foot fracture  Cont CIR  WHO/PRAFO better fitting, encouraged complete compliance  Fluoxetine started 10/5, increased on 10/11  Contacted Ortho, partial WB RLE. Xray reviewed, displacement of 5th digit, but healing. Per Ortho WBAT in boot and repeat films 10/19, reviewed, showing slight increase in callous formation around fracture. Per orthosis, boot for comfort now.  Baclofen 5 TID started 10/10, DC'd on 10/11 due to lethargy  Trial tizanidine 2 mg 3 times a day, states this also caused drowsiness  TEE relatively unremarkable with mild AI and MR per report 2. DVT Prophylaxis/Anticoagulation: Pharmaceutical: Lovenox 3. Pain Management/Chronic low back pain: Ice /heat for back with ultram prn.   Kpad helps. 4. Mood: LCSW to follow for evaluation and support.  5. Neuropsych: This patient iscapable of making decisions on herown behalf. 6. Skin/Wound Care: routine pressure relief measures. Elevate RLE when seated.  7. Fluids/Electrolytes/Nutrition: Monitor I/O.   BMP within normal range on 10/16 8. Dyslipidemia: Continue lipitor.  9. HTN: Will resume HCTZ if indicated.   Controlled 10/20 Vitals:   05/23/17 1533 05/24/17 0500  BP: 122/70 133/77  Pulse: 61 65  Resp: 16 16  Temp: 98.6 F (37 C) 98.4 F (36.9 C)  SpO2: 98% 95%   10. ABLA:   Hb 11.9 on 10/16  Cont to monitor 11. Constipation  Bowel reg increased on 10/3, Senna-s d/ced 10/17  Colace 100 BID started on 10/17  Improving 12. 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  Podiatrist following per  daughter 60. Tachypnea/Tachycardia  Resolved 14. Sleep disturbance  Melatonin ordered 10/8  Encouraged improved sleep hygiene, again on 10/18  Improving 15. Neurogenic bladder   Bethanechol 5 mg 3 times a day started on 10/11, d/ced 10/15  PVRs with no residual  Will consider anti-spasmodic if patient/daughter interested (per report, currently feel she is  being prescribed too many medications for too many conditions)  LOS (Days) 18 A FACE TO FACE EVALUATION WAS PERFORMED  Carole Doner Lorie Phenix 05/24/2017 7:16 AM

## 2017-05-25 ENCOUNTER — Ambulatory Visit (HOSPITAL_COMMUNITY): Payer: Medicare Other

## 2017-05-25 NOTE — Progress Notes (Signed)
[facility] PHYSICAL MEDICINE & REHABILITATION     PROGRESS NOTE  Subjective/Complaints:  Patient seen sitting up in bed this morning. She states she sleeping better with any bed. She has questions regarding her crying spells and if anything can be done.  ROS: Denies CP, SOB, N/V/D.  Objective: Vital Signs: Blood pressure (!) 145/73, pulse 64, temperature 98.8 F (37.1 C), temperature source Oral, resp. rate 16, height 5\' 7"  (1.702 m), weight 83.5 kg (184 lb), SpO2 93 %. Dg Foot Complete Right  Result Date: 05/23/2017 CLINICAL DATA:  Fractured metatarsal. EXAM: RIGHT FOOT COMPLETE - 3+ VIEW COMPARISON:  05/08/2015.  03/16/2017 FINDINGS: Angulated display fracture of the right fifth metatarsal again noted. Slight increasing callus formation noted . Diffuse osteopenia degenerative change IMPRESSION: Slight increase in callus formation noted about the right fifth metatarsal fracture. Electronically Signed   By: Marcello Moores  Register   On: 05/23/2017 07:45   No results for input(s): WBC, HGB, HCT, PLT in the last 72 hours. No results for input(s): NA, K, CL, GLUCOSE, BUN, CREATININE, CALCIUM in the last 72 hours.  Invalid input(s): CO CBG (last 3)  No results for input(s): GLUCAP in the last 72 hours.  Wt Readings from Last 3 Encounters:  05/15/17 83.5 kg (184 lb)  05/03/17 87.8 kg (193 lb 8 oz)    Physical Exam:  BP (!) 145/73 (BP Location: Right Arm)   Pulse 64   Temp 98.8 F (37.1 C) (Oral)   Resp 16   Ht 5\' 7"  (1.702 m)   Wt 83.5 kg (184 lb)   SpO2 93%   BMI 28.82 kg/m  Constitutional: She appears well-developedand well-nourished. NAD. HENT: Normocephalicand atraumatic.  Eyes: EOMI. No discharge.  Cardiovascular: RRR. No JVD. Respiratory: No respiratory distress. She has no wheezes.  GI: She exhibits no distension. BS+ Musculoskeletal: She exhibits no edemaor deformity.  Neurological:  Right gaze preference, improving.  Motor: LUE: 1/5 shoulder abduction, 0/5 distally  (unchanged) LLE: 3+/5 HF, 3-/5 KE 3/5, 0/5 distally.  Tone left elbow flexion, ankle plantarflexion Mild dysarthria  Follows simple commands.  Insight and awareness into deficits. Skin: Skin is warm.  Psychiatric: Normal mood and behavior  Assessment/Plan: 1. Functional deficits secondary to right basal ganglia infarct with recent right foot fracture which require 3+ hours per day of interdisciplinary therapy in a comprehensive inpatient rehab setting. Physiatrist is providing close team supervision and 24 hour management of active medical problems listed below. Physiatrist and rehab team continue to assess barriers to discharge/monitor patient progress toward functional and medical goals.  Function:  Bathing Bathing position   Position: Wheelchair/chair at sink  Bathing parts Body parts bathed by patient: Chest, Abdomen Body parts bathed by helper: Right arm, Left arm, Back  Bathing assist Assist Level: Touching or steadying assistance(Pt > 75%)      Upper Body Dressing/Undressing Upper body dressing   What is the patient wearing?: Bra, Pull over shirt/dress Bra - Perfomed by patient: Thread/unthread right bra strap, Thread/unthread left bra strap Bra - Perfomed by helper: Hook/unhook bra (pull down sports bra) Pull over shirt/dress - Perfomed by patient: Thread/unthread right sleeve, Put head through opening, Pull shirt over trunk, Thread/unthread left sleeve Pull over shirt/dress - Perfomed by helper: Thread/unthread left sleeve        Upper body assist Assist Level:  (Max assist)      Lower Body Dressing/Undressing Lower body dressing   What is the patient wearing?: Pants, Shoes, Non-skid slipper socks, Underwear Underwear - Performed by  patient: Thread/unthread right underwear leg, Thread/unthread left underwear leg Underwear - Performed by helper: Pull underwear up/down Pants- Performed by patient: Thread/unthread right pants leg, Thread/unthread left pants leg, Pull  pants up/down Pants- Performed by helper: Pull pants up/down Non-skid slipper socks- Performed by patient: Don/doff right sock (R sock only) Non-skid slipper socks- Performed by helper: Don/doff right sock     Shoes - Performed by patient: Don/doff left shoe (L shoe only) Shoes - Performed by helper: Don/doff left shoe   AFO - Performed by helper: Don/doff right AFO      Lower body assist Assist for lower body dressing: Touching or steadying assistance (Pt > 75%)      Toileting Toileting   Toileting steps completed by patient: Adjust clothing prior to toileting, Performs perineal hygiene, Adjust clothing after toileting Toileting steps completed by helper: Adjust clothing prior to toileting, Performs perineal hygiene, Adjust clothing after toileting Toileting Assistive Devices: Grab bar or rail  Toileting assist Assist level: Touching or steadying assistance (Pt.75%)   Transfers Chair/bed transfer   Chair/bed transfer method: Stand pivot Chair/bed transfer assist level: Moderate assist (Pt 50 - 74%/lift or lower) Chair/bed transfer assistive device: Armrests     Locomotion Ambulation     Max distance: 30' Assist level: Moderate assist (Pt 50 - 74%)   Wheelchair   Type: Manual Max wheelchair distance: 250' Assist Level: Supervision or verbal cues  Cognition Comprehension Comprehension assist level: Follows complex conversation/direction with extra time/assistive device  Expression Expression assist level: Expresses complex 90% of the time/cues < 10% of the time  Social Interaction Social Interaction assist level: Interacts appropriately 90% of the time - Needs monitoring or encouragement for participation or interaction.  Problem Solving Problem solving assist level: Solves basic problems with no assist  Memory Memory assist level: Recognizes or recalls 90% of the time/requires cueing < 10% of the time    Medical Problem List and Plan: 1. Left hemiparesis and  functional deficitssecondary to right basal ganglia infarct with recent right foot fracture  Cont CIR  WHO/PRAFO better fitting, encouraged complete compliance  Fluoxetine started 10/5, increased on 10/11  Contacted Ortho, partial WB RLE. Xray reviewed, displacement of 5th digit, but healing. Films eviewed, showing slight increase in callous formation around fracture, orthopedic, boot for comfort as needed.  Baclofen 5 TID started 10/10, DC'd on 10/11 due to lethargy  Trial tizanidine 2 mg 3 times a day, states this also caused drowsiness  TEE relatively unremarkable with mild AI and MR per report 2. DVT Prophylaxis/Anticoagulation: Pharmaceutical: Lovenox 3. Pain Management/Chronic low back pain: Ice /heat for back with ultram prn.   Kpad helps. 4. Mood: LCSW to follow for evaluation and support.  5. Neuropsych: This patient iscapable of making decisions on herown behalf. 6. Skin/Wound Care: routine pressure relief measures. Elevate RLE when seated.  7. Fluids/Electrolytes/Nutrition: Monitor I/O.   BMP within normal range on 10/16 8. Dyslipidemia: Continue lipitor.  9. HTN: Will resume HCTZ if indicated.   Overall controlled on 10/21 Vitals:   05/24/17 1500 05/25/17 0332  BP: 134/63 (!) 145/73  Pulse: (!) 55 64  Resp: 16 16  Temp: 98.6 F (37 C) 98.8 F (37.1 C)  SpO2: 100% 93%   10. ABLA:   Hb 11.9 on 10/16  Labs ordered for tomorrow  Cont to monitor 11. Constipation  Bowel reg increased on 10/3, Senna-s d/ced 10/17  Colace 100 BID started on 10/17  Improving 12. Chronic Callous  B/l feet per daughter with  routine shavings.  Podiatrist following per daughter 36. Tachypnea/Tachycardia  Resolved 14. Sleep disturbance  Melatonin ordered 10/8  Encouraged improved sleep hygiene, again on 10/18  Improving 15. Neurogenic bladder   Bethanechol 5 mg 3 times a day started on 10/11, d/ced 10/15  PVRs with no residual  Will consider anti-spasmodic if patient/daughter  interested (per report, currently feel she is being prescribed too many medications for too many conditions)  LOS (Days) 19 A FACE TO FACE EVALUATION WAS PERFORMED  Arnecia Ector Lorie Phenix 05/25/2017 6:33 AM

## 2017-05-25 NOTE — Progress Notes (Signed)
Physical Therapy Session Note  Patient Details  Name: Kelly Lowe MRN: [id number] Date of Birth: Nov 06, 1950  Today's Date: 05/25/2017 PT Individual Time: 4166-0630 PT Individual Time Calculation (min): 47 min   Short Term Goals: Week 3:  PT Short Term Goal 1 (Week 3): STG=LTG due to ELOS  Skilled Therapeutic Interventions/Progress Updates:    Pt seated in w/c upon PT arrival, agreeable to therapy session and denies pain. Pt's family present for family training this session including both of pt's daughters Kelly Lowe will be primary caregiver), son in law and grandson. Therapist reviewed w/c management including brakes, leg rests and arm rests. Family provided shoes for AFO, therapist explained and demonstrated how to don/doff AFO/shoes. Therapist explained that pt will be primarily performing stand pivot transfers using hemi walker for balance in order to transfer w/c<>car, w/c<>bed and w/c<>toilet. Therapist performed each transfer with the pt min assist and then family member also practiced each transfer providing min assist. Pt ambulated x 35 ft with therapist using hemiwalker, L AFO and min assist and x 35 ft with caregiver Kelly Lowe) and min assist. Discussed with the family that ambulation within the home should only be used for walking in/out of the bathroom since the w/c will not fit, family agrees. Pt ascended/descended 4 steps using AFO and single handrail with min assist for ascending and mod assist for descending to prevent scissoring. Family has chair lift installed to minimize use of stair at home and for safety. Therapist and family agrees that pt will continue to work on Best [employer] with [place] and will use chair lift with family for now. Pt left seated in w/c at end of session with family present, all questions answered.   Therapy Documentation Precautions:  Precautions Precautions: Fall Precaution Comments:   Restrictions Weight Bearing Restrictions: No RLE Weight Bearing:  Weight bearing as tolerated RLE Partial Weight Bearing Percentage or Pounds:  (weight only via heel) Other Position/Activity Restrictions: Must be in boot for any weight bearing   See Function Navigator for Current Functional Status.   Therapy/Group: Individual Therapy  Netta Corrigan, PT, DPT 05/25/2017, 12:48 PM

## 2017-05-25 NOTE — Progress Notes (Signed)
Slept in bed all night. PRN tylenol given per patient's request along with scheduled melatonin. Kelly Lowe A

## 2017-05-26 ENCOUNTER — Inpatient Hospital Stay (HOSPITAL_COMMUNITY): Payer: Medicare Other

## 2017-05-26 ENCOUNTER — Inpatient Hospital Stay (HOSPITAL_COMMUNITY): Payer: Medicare Other | Admitting: Occupational Therapy

## 2017-05-26 ENCOUNTER — Encounter (HOSPITAL_COMMUNITY): Payer: Medicare Other | Admitting: Psychology

## 2017-05-26 ENCOUNTER — Encounter (HOSPITAL_COMMUNITY): Payer: Medicare Other

## 2017-05-26 ENCOUNTER — Inpatient Hospital Stay (HOSPITAL_COMMUNITY): Payer: 59 | Admitting: Occupational Therapy

## 2017-05-26 DIAGNOSIS — F4321 Adjustment disorder with depressed mood: Secondary | ICD-10-CM

## 2017-05-26 LAB — CBC WITH DIFFERENTIAL/PLATELET
Basophils Absolute: 0 10*3/uL (ref 0.0–0.1)
Basophils Relative: 0 %
Eosinophils Absolute: 0.1 10*3/uL (ref 0.0–0.7)
Eosinophils Relative: 2 %
HEMATOCRIT: 31.4 % — AB (ref 36.0–46.0)
Hemoglobin: 10.4 g/dL — ABNORMAL LOW (ref 12.0–15.0)
LYMPHS ABS: 2.4 10*3/uL (ref 0.7–4.0)
LYMPHS PCT: 47 %
MCH: 32.7 pg (ref 26.0–34.0)
MCHC: 33.1 g/dL (ref 30.0–36.0)
MCV: 98.7 fL (ref 78.0–100.0)
MONO ABS: 0.5 10*3/uL (ref 0.1–1.0)
MONOS PCT: 9 %
NEUTROS ABS: 2.2 10*3/uL (ref 1.7–7.7)
Neutrophils Relative %: 42 %
Platelets: 137 10*3/uL — ABNORMAL LOW (ref 150–400)
RBC: 3.18 MIL/uL — ABNORMAL LOW (ref 3.87–5.11)
RDW: 13 % (ref 11.5–15.5)
WBC: 5.2 10*3/uL (ref 4.0–10.5)

## 2017-05-26 LAB — BASIC METABOLIC PANEL
ANION GAP: 8 (ref 5–15)
BUN: 5 mg/dL — ABNORMAL LOW (ref 6–20)
CHLORIDE: 105 mmol/L (ref 101–111)
CO2: 26 mmol/L (ref 22–32)
Calcium: 9.4 mg/dL (ref 8.9–10.3)
Creatinine, Ser: 0.81 mg/dL (ref 0.44–1.00)
GFR calc Af Amer: >60 mL/min (ref >60)
GLUCOSE: 93 mg/dL (ref 65–99)
POTASSIUM: 3.5 mmol/L (ref 3.5–5.1)
Sodium: 139 mmol/L (ref 135–145)

## 2017-05-26 NOTE — Progress Notes (Signed)
Occupational Therapy Session Note  Patient Details  Name: Kelly Lowe MRN: [id number] Date of Birth: 11/06/1950  Today's Date: 05/26/2017 OT Group Time: 1300-1400 OT Group Time Calculation (min): 60 min  Skilled Therapeutic Interventions/Progress Updates:    Tx focus on Lt NMR and standing balance during leisure occupational engagement in group setting.   Pt standing at elevated table playing scrabble. Required HOH to maintain L UE weightbearing on table. Pt with Rt bias in standing, addressed this therapeutically with manual facilitation and by having her weight shift to Lt to place tiles on game board. She required frequent seated rest breaks. At end of tx pt was left with daughter to escort pt back to room in w/c.   Therapy Documentation Precautions:  Precautions Precautions: Fall Precaution Comments:   Restrictions Weight Bearing Restrictions: No RLE Weight Bearing: Weight bearing as tolerated RLE Partial Weight Bearing Percentage or Pounds: WBAT Other Position/Activity Restrictions: boot no longer required Vital Signs: Therapy Vitals Temp: 99.3 F (37.4 C) Temp Source: Oral Pulse Rate: 69 Resp: 18 BP: 131/68 Patient Position (if appropriate): Sitting Oxygen Therapy SpO2: 97 % O2 Device: Not Delivered Pain: No c/o pain during tx    ADL:     See Function Navigator for Current Functional Status.   Therapy/Group: Group Therapy  Anina Schnake A Haniah Penny 05/26/2017, 3:01 PM

## 2017-05-26 NOTE — Progress Notes (Signed)
[place] PHYSICAL MEDICINE & REHABILITATION     PROGRESS NOTE  Subjective/Complaints:  Pt seen sitting up in her chair.  She states she did not sleep well overnight.  She states yesterday she had some right sided weakness but that has since resolved. She also finally admits that she is anxious.    ROS: Denies CP, SOB, N/V/D.  Objective: Vital Signs: Blood pressure 135/70, pulse 62, temperature 98.1 F (36.7 C), temperature source Oral, resp. rate 16, height 5\' 7"  (1.702 m), weight 83.5 kg (184 lb), SpO2 93 %. No results found.  Recent Labs  05/26/17 0510  WBC 5.2  HGB 10.4*  HCT 31.4*  PLT 137*    Recent Labs  05/26/17 0510  NA 139  K 3.5  CL 105  GLUCOSE 93  BUN 5*  CREATININE 0.81  CALCIUM 9.4   CBG (last 3)  No results for input(s): GLUCAP in the last 72 hours.  Wt Readings from Last 3 Encounters:  05/15/17 83.5 kg (184 lb)  05/03/17 87.8 kg (193 lb 8 oz)    Physical Exam:  BP 135/70 (BP Location: Right Arm)   Pulse 62   Temp 98.1 F (36.7 C) (Oral)   Resp 16   Ht 5\' 7"  (1.702 m)   Wt 83.5 kg (184 lb)   SpO2 93%   BMI 28.82 kg/m  Constitutional: She appears well-developedand well-nourished. NAD. HENT: Normocephalicand atraumatic.  Eyes: EOMI. No discharge.  Cardiovascular: RRR. No JVD. Respiratory: No respiratory distress. She has no wheezes.  GI: She exhibits no distension. BS+ Musculoskeletal: She exhibits no edemaor deformity.  Neurological:  Right gaze preference, improving.  Motor: LUE: 1/5 shoulder abduction, 0/5 distally (stable) LLE: 3+/5 HF, 3-/5 KE 3/5, 0/5 distally.  Tone left elbow flexion, ankle plantarflexion Mild dysarthria  Follows commands.  Insight and awareness into deficits. Skin: Skin is warm.  Psychiatric: Normal mood and behavior  Assessment/Plan: 1. Functional deficits secondary to right basal ganglia infarct with recent right foot fracture which require 3+ hours per day of interdisciplinary therapy in a  comprehensive inpatient rehab setting. Physiatrist is providing close team supervision and 24 hour management of active medical problems listed below. Physiatrist and rehab team continue to assess barriers to discharge/monitor patient progress toward functional and medical goals.  Function:  Bathing Bathing position   Position: Wheelchair/chair at sink  Bathing parts Body parts bathed by patient: Chest, Abdomen Body parts bathed by helper: Right arm, Left arm, Back  Bathing assist Assist Level: Touching or steadying assistance(Pt > 75%)      Upper Body Dressing/Undressing Upper body dressing   What is the patient wearing?: Bra, Pull over shirt/dress Bra - Perfomed by patient: Thread/unthread right bra strap, Thread/unthread left bra strap Bra - Perfomed by helper: Hook/unhook bra (pull down sports bra) Pull over shirt/dress - Perfomed by patient: Thread/unthread right sleeve, Put head through opening, Pull shirt over trunk, Thread/unthread left sleeve Pull over shirt/dress - Perfomed by helper: Thread/unthread left sleeve        Upper body assist Assist Level:  (Max assist)      Lower Body Dressing/Undressing Lower body dressing   What is the patient wearing?: Pants, Shoes, Non-skid slipper socks, Underwear Underwear - Performed by patient: Thread/unthread right underwear leg, Thread/unthread left underwear leg Underwear - Performed by helper: Pull underwear up/down Pants- Performed by patient: Thread/unthread right pants leg, Thread/unthread left pants leg, Pull pants up/down Pants- Performed by helper: Pull pants up/down Non-skid slipper socks- Performed by patient: Don/doff  right sock (R sock only) Non-skid slipper socks- Performed by helper: Don/doff right sock     Shoes - Performed by patient: Don/doff left shoe (L shoe only) Shoes - Performed by helper: Don/doff left shoe   AFO - Performed by helper: Don/doff right AFO      Lower body assist Assist for lower body  dressing: Touching or steadying assistance (Pt > 75%)      Toileting Toileting   Toileting steps completed by patient: Adjust clothing prior to toileting Toileting steps completed by helper: Performs perineal hygiene, Adjust clothing after toileting Toileting Assistive Devices: Grab bar or rail  Toileting assist Assist level: Touching or steadying assistance (Pt.75%)   Transfers Chair/bed transfer   Chair/bed transfer method: Stand pivot Chair/bed transfer assist level: Moderate assist (Pt 50 - 74%/lift or lower) Chair/bed transfer assistive device: Armrests     Locomotion Ambulation     Max distance: 27' Assist level: Moderate assist (Pt 50 - 74%)   Wheelchair   Type: Manual Max wheelchair distance: 250' Assist Level: Supervision or verbal cues  Cognition Comprehension Comprehension assist level: Follows complex conversation/direction with extra time/assistive device  Expression Expression assist level: Expresses basic 90% of the time/requires cueing < 10% of the time.  Social Interaction Social Interaction assist level: Interacts appropriately 90% of the time - Needs monitoring or encouragement for participation or interaction.  Problem Solving Problem solving assist level: Solves basic problems with no assist  Memory Memory assist level: Recognizes or recalls 90% of the time/requires cueing < 10% of the time    Medical Problem List and Plan: 1. Left hemiparesis and functional deficitssecondary to right basal ganglia infarct with recent right foot fracture  Cont CIR  Family conference today  WHO/PRAFO better fitting, encouraged complete compliance  Fluoxetine started 10/5, increased on 10/11  Contacted Ortho, partial WB RLE. Xray reviewed, displacement of 5th digit, but healing. Films eviewed, showing slight increase in callous formation around fracture, orthopedic, boot for comfort as needed.  Baclofen 5 TID started 10/10, DC'd on 10/11 due to lethargy  Trial  tizanidine 2 mg 3 times a day, states this also caused drowsiness  TEE relatively unremarkable with mild AI and MR per report 2. DVT Prophylaxis/Anticoagulation: Pharmaceutical: Lovenox 3. Pain Management/Chronic low back pain: Ice /heat for back with ultram prn.   Kpad helps. 4. Mood: LCSW to follow for evaluation and support.  5. Neuropsych: This patient iscapable of making decisions on herown behalf. 6. Skin/Wound Care: routine pressure relief measures. Elevate RLE when seated.  7. Fluids/Electrolytes/Nutrition: Monitor I/O.   BMP within normal range on 10/16 8. Dyslipidemia: Continue lipitor.  9. HTN: Will resume HCTZ if indicated.   Overall controlled on 10/22 Vitals:   05/25/17 1519 05/26/17 0350  BP: 131/67 135/70  Pulse: (!) 53 62  Resp: 18 16  Temp: 98.7 F (37.1 C) 98.1 F (36.7 C)  SpO2: 95% 93%   10. ABLA:   Hb 10.4 on 10/22  Cont to monitor 11. Constipation  Bowel reg increased on 10/3, Senna-s d/ced 10/17  Colace 100 BID started on 10/17  Improving 12. 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  Podiatrist following per daughter 64. Tachypnea/Tachycardia  Resolved 14. Sleep disturbance  Melatonin ordered 10/8  Encouraged improved sleep hygiene, again on 10/18  Improving 15. Neurogenic bladder   Bethanechol 5 mg 3 times a day started on 10/11, d/ced 10/15  PVRs with no residual  Will consider anti-spasmodic if patient/daughter interested, they continue  to avoid medications  >35 minutes spent with patient and family with >30 minutes in counseling regarding symptoms, potential interventions  LOS (Days) 20 A FACE TO FACE EVALUATION WAS PERFORMED  Meliss Fleek Lorie Phenix 05/26/2017 8:26 AM

## 2017-05-26 NOTE — Progress Notes (Signed)
Scheduled Melatonin and PRN tylenol given at 2230, not effective.  Patient has been restless and awake since 2400, without specific complaint. Daughter at bedside. Patient noticed blood on tissue after blowing her nose, yesterday. Patient reports RUE weakness and numbness to mouth, that occurred yesterday, seems better. Kelly Lowe

## 2017-05-26 NOTE — Progress Notes (Signed)
Social Work Patient ID: Kelly Lowe, female   DOB: 09/17/1950, 66 y.o.   MRN: [id number] Mini family conference with daughter-lari, MD and Jennifer-Director of RN and myself to answer daughter's questions and address her concerns about discharge for Mom. MD answered medical issues for instance medications going home on and acupuncture and massage. Anderson Malta discussed how RN update family members per telephone and lift education with staff. Daughter felt heard and her concerns addressed. Will work toward discharge on Wed.

## 2017-05-26 NOTE — Consult Note (Signed)
Neuropsychological Consultation   Patient:   Kelly Lowe   DOB:   02/22/1951  MR Number:  [id number]  Location:  [place] [address] [facility] [address] [id number] [place] [address] Dept: [place]: [phone]           Date of Service:   05/26/2017  Start Time:   10 AM End Time:   11 AM  Provider/Observer:  Ilean Skill, Psy.D.       Clinical Neuropsychologist       Billing Code/Service: 98338 4 Units  Chief Complaint:    Kelly Lowe is a 66 year old female with history of hypertension, right foot fractures.  Pt was admitted on 9/29/018 with left sided weakness, right gaze preference and left facial droop.  CT revealed right basal gangial infarct and MRI revealed moderate SVD as well.  Left hemiparesis and sensory deficits, right gaze preference, decreased awareness and reduced information processing speed was noted as well.  The patient has had improving cognitive functioning.  The patient has been coping with loss of motor function and how this has changed her life.  She will be going to live with daughter in [place], [place]  The patient is having sudden onset crying spells, but these are all associated with situation and context of communications.  No sudden onset of laughter.  No crying dissociated from context.  The patient has continued with issues around adjustment and need to go live with daughter.   Reason for Service:  Kelly Lowe was referred for a neuropsychological consultation due to coping issues and ongoing efforts for increasing adaptive skills.  Below is the HPI for the current admission.    HPI: Kelly Lowe a 66 y.o.femalewith history of HTN, right foot fractures 03/2017; who was admitted on 05/03/17 with left sided weakness, right gaze preference and left facial droop. History taken from chart review, patient, and sister. CT head reviewed, showing right basal gangial  infarct. Per report, age indeterminate right caudate infarct and CTA head/neck revealed intracranial atherosclerosis involving R>L PCA.MRI brain done revealing Non-hemorrhagic infarct in right basal ganglia, punctate infarct in left splenium and moderate underlying small vessel disease.2 D echo done revealing EF 65-70% with no wall abnormality, mild MVR, TVR and PVR. Hip films done due to reports of fall and back pain and showed DDD and no hip fracture or dislocation.   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 Dr. Leonie Man recommends ASA for embolic stroke due to unknown source and will need TEE/loop. TCD without hits or evidence of PFO. Patient with resultant deficits in mobility and self care tasks due to dense left hemiparesis with sensory deficits, right gaze preference, decrease awareness of deficits as well as delayed processing due to higher level deficits. CIR recommended for follow up therapy.   Current Status:  The patient had good mental status exam today and improving global cognitive functioning.  Her thinking was clear but she is having sudden onset of crying spells.  There were still issues of overall information processing speed but expressive and receptive language function was in tact, and short and long term memory.  Depressive symptoms improved but still continuing.  Behavioral Observation: Kelly Lowe  presents as a 66 y.o.-year-old Right African American Female who appeared her stated age. her dress was Appropriate and she was Well Groomed and her  manners were Appropriate to the situation.  her participation was indicative of Appropriate and Attentive behaviors.  There were physical disabilities noted related to left hemiparesis.  Marland Kitchen  she displayed an appropriate level of cooperation and motivation.     Interactions:    Active Appropriate and  Attentive  Attention:   within normal limits and attention span and concentration were age appropriate  Memory:   within normal limits; recent and remote memory intact  Visuo-spatial:  not examined  Speech (Volume):  low  Speech:   normal; normal  Thought Process:  Coherent and Relevant  Though Content:  WNL; not suicidal  Orientation:   person, place, time/date and situation  Judgment:   Good  Planning:   Good  Affect:    Anxious  Mood:    Anxious  Insight:   Good  Intelligence:   high  Substance Use:  No concerns of substance abuse are reported.    Medical History:   Past Medical History:  Diagnosis Date  . Hypertension        Family Med/Psych History: History reviewed. No pertinent family history.  Risk of Suicide/Violence: virtually non-existent   Impression/DX:  Kelly Lowe is a 66 year old female with history of hypertension, right foot fractures.  Pt was admitted on 9/29/018 with left sided weakness, right gaze preference and left facial droop.  CT revealed right basal gangial infarct and MRI revealed moderate SVD as well.  Left hemiparesis and sensory deficits, right gaze preference, decreased awareness and reduced information processing speed was noted as well.  The patient has had improving cognitive functioning.    The patient had good mental status exam today and improving global cognitive functioning.  There were still issues of overall information processing speed but expressive and receptve language function was in tact  And short and long term memory.  The patient reports increased issues with anxiety and sudden crying events when she talks with family or coworkers.  The patient reports that she does not have these emotional responses out of the blue but when she talks to people and has to deal with what has happened to her.  The patient is having sudden onset crying spells, but these are all associated with situation and context of communications.   No sudden onset of laughter.  No crying dissociated from context.   Depressive symptoms improved but still continuing.  Disposition/Plan:  Family deciding if she will see someone in [place] or come back here to follow-up.  She will follow-up with Dr. Posey Pronto.  Diagnosis:   Right basal ganglia infarction, left hemiparesis  Depressive symptoms         Electronically Signed   _______________________ Ilean Skill, Psy.D.

## 2017-05-26 NOTE — Progress Notes (Signed)
Occupational Therapy Session Note  Patient Details  Name: Kelly Lowe MRN: [id number] Date of Birth: 07/21/1951  Today's Date: 05/26/2017 OT Individual Time: 1950-9326 OT Individual Time Calculation (min): 75 min    Short Term Goals: Week 2:  OT Short Term Goal 1 (Week 2): Pt will complete toilet transfers with min A.   OT Short Term Goal 1 - Progress (Week 2): Met OT Short Term Goal 2 (Week 2): Pt will complete tub bench transfers with min A. OT Short Term Goal 2 - Progress (Week 2): Met OT Short Term Goal 3 (Week 2): Pt will maintain standing balance with steadying A while pulling pants over hips with min A. OT Short Term Goal 3 - Progress (Week 2): Met OT Short Term Goal 4 (Week 2): Pt will be able to wt shift to her L side with control to allow her to self cleanse post toileting with steadying A. OT Short Term Goal 4 - Progress (Week 2): Met Week 3:  OT Short Term Goal 1 (Week 3): STGs=LTGs due to ELOS  Skilled Therapeutic Interventions/Progress Updates:    Pt seen this session for ADL training with practice with mobility with hemiwalker to prepare for home.  Pt received in recliner and practiced using hemiwalker with steadying A to transfer to W/c and then to toilet.  Pt completed toileting and then used hemiwalker to ambulate with steadying A to shower stall and after shower back to w/c in room.  She utilized hemidressing techniques well and practiced with donning a bra on overhead.  She would benefit from elastic shoe laces or shoe buttons.   Her daughter arrived and showed this therapist a photo of the bathroom set up.  Pt will need a BSC to use over the toilet. They have a walk in shower with sliding doors and a built in seat.  Recommended a grab bar on back wall and a non slip tub mat. They may also need to remove doors and replace with a shower curtain to allow for more space to assist her in the shower.  Once they return home, it would be safest to practice this transfer with  the home health therapist first before trying on their own. Pt completed all self care tasks using her LUE as a stabilizing A with min cues.    Therapy Documentation Precautions:  Precautions Precautions: Fall Precaution Comments:   Restrictions Weight Bearing Restrictions: No RLE Weight Bearing: Weight bearing as tolerated RLE Partial Weight Bearing Percentage or Pounds:    Pain: Pain Assessment Pain Assessment: No/denies pain Faces Pain Scale: No hurt ADL:   See Function Navigator for Current Functional Status.   Therapy/Group: Individual Therapy  [place] 05/26/2017, 12:14 PM

## 2017-05-26 NOTE — Progress Notes (Signed)
Physical Therapy Session Note  Patient Details  Name: Kelly Lowe MRN: [id number] Date of Birth: 1950-12-05  Today's Date: 05/26/2017 PT Individual Time: 1740-8144 PT Individual Time Calculation (min): 56 min   Short Term Goals: Week 3:  PT Short Term Goal 1 (Week 3): STG=LTG due to ELOS  Skilled Therapeutic Interventions/Progress Updates:    Pt seated in w/c upon PT arrival, agreeable to therapy session and denies pain. Session focused on performing car transfer with the pt's daughters car in order to simulate car transfer for d/c. Pt performed car transfer x 1 with therapist and x 1 with daughter, stand pivot using hemi walker and min assist. Pt transported back to gym in w/c, total assist. Pt transferred w/c<>mat with min assist stand pivot using hemiwalker. Pt transferred sitting<>supine with min assist. In supine pt worked on [employer] for L LE NMR to include 2 x 10 heel slides, 2 x 10 AROM hip abduction, 2 x 10 quad sets, 2 x 10 bridges and 2 x 10 SAQ. Therapist performed L hip flexor stretch 2 x 1 min in sidelying. Pt sitting edge of mat reporting L shoulder tightness, therapist performed scapular mobilizations x 4 minutes for shoulder mobility and pain relief. Pt left seated in w/c at end of session with needs in reach.   Therapy Documentation Precautions:  Precautions Precautions: Fall Precaution Comments:   Restrictions Weight Bearing Restrictions: No RLE Weight Bearing: Weight bearing as tolerated RLE Partial Weight Bearing Percentage or Pounds: WBAT Other Position/Activity Restrictions: boot no longer required   See Function Navigator for Current Functional Status.   Therapy/Group: Individual Therapy  Netta Corrigan, PT, DPT 05/26/2017, 3:25 PM

## 2017-05-26 NOTE — Progress Notes (Signed)
Occupational Therapy Session Note  Patient Details  Name: Kelly Lowe MRN: [id number] Date of Birth: 1951/05/07  Today's Date: 05/26/2017 OT Individual Time: 1451-1532 OT Individual Time Calculation (min): 41 min    Short Term Goals: Week 3:  OT Short Term Goal 1 (Week 3): STGs=LTGs due to ELOS  Skilled Therapeutic Interventions/Progress Updates:    Discussed bathroom setup with daughter Kelly Lowe and pt.  She was able to show therapist picture of bathroom and shower and discuss possible shower transfer scenarios.  Pt's daughter was not able to participate in session but plans to be present all day tomorrow.  First had pt practice turning around and stepping back over the edge of the simulated walk-in shower secondary to bathroom setup, with built in shower seat at the back of the shower, and toilet blocking the front entrance of the shower.  She needed mod demonstrational cueing and mod assist for stepping over backwards with hemiwalker in front, secondary to not being able to clear the LLE over the edge, even with AFO in place.  Therapist had to assist with lifting the LLE over as well as helping to keep her balanced.  Feel this method is not safe for use at home at this time.  Instead had pt complete with use of tub shower bench, with bench placed beside of toilet and facing the rear of the shower.  With this she was able to pivot around and sit down on the shower bench with use of the hemiwalker and then swing LEs over the edge of the shower, with min assist for lifting the LLE.  Feel this method is the safest at this time.  Returned to room via wheelchair with call button and phone in reach.    Therapy Documentation Precautions:  Precautions Precautions: Fall Precaution Comments:   Restrictions Weight Bearing Restrictions: No RLE Weight Bearing: Weight bearing as tolerated RLE Partial Weight Bearing Percentage or Pounds: WBAT Other Position/Activity Restrictions: boot no longer  required  Pain: Pain Assessment Pain Assessment: No/denies pain ADL:  See Function Navigator for Current Functional Status.   Therapy/Group: Individual Therapy  Sumiko Ceasar OTR/L 05/26/2017, 4:25 PM

## 2017-05-27 ENCOUNTER — Inpatient Hospital Stay (HOSPITAL_COMMUNITY): Payer: Medicare Other

## 2017-05-27 ENCOUNTER — Inpatient Hospital Stay (HOSPITAL_COMMUNITY): Payer: Medicare Other | Admitting: Occupational Therapy

## 2017-05-27 DIAGNOSIS — F4321 Adjustment disorder with depressed mood: Secondary | ICD-10-CM

## 2017-05-27 LAB — BASIC METABOLIC PANEL
ANION GAP: 8 (ref 5–15)
BUN: 6 mg/dL (ref 6–20)
CALCIUM: 9.6 mg/dL (ref 8.9–10.3)
CO2: 24 mmol/L (ref 22–32)
Chloride: 105 mmol/L (ref 101–111)
Creatinine, Ser: 0.81 mg/dL (ref 0.44–1.00)
GFR calc Af Amer: >60 mL/min (ref >60)
GFR calc non Af Amer: >60 mL/min (ref >60)
GLUCOSE: 88 mg/dL (ref 65–99)
Potassium: 3.5 mmol/L (ref 3.5–5.1)
Sodium: 137 mmol/L (ref 135–145)

## 2017-05-27 LAB — CBC
HEMATOCRIT: 34.5 % — AB (ref 36.0–46.0)
HEMOGLOBIN: 11.5 g/dL — AB (ref 12.0–15.0)
MCH: 32.8 pg (ref 26.0–34.0)
MCHC: 33.3 g/dL (ref 30.0–36.0)
MCV: 98.3 fL (ref 78.0–100.0)
Platelets: 155 10*3/uL (ref 150–400)
RBC: 3.51 MIL/uL — ABNORMAL LOW (ref 3.87–5.11)
RDW: 12.8 % (ref 11.5–15.5)
WBC: 5.4 10*3/uL (ref 4.0–10.5)

## 2017-05-27 MED ORDER — ALPRAZOLAM 0.25 MG PO TABS
0.2500 mg | ORAL_TABLET | Freq: Once | ORAL | Status: AC
  Administered 2017-05-27: 0.25 mg via ORAL
  Filled 2017-05-27: qty 1

## 2017-05-27 NOTE — Progress Notes (Signed)
Physical Therapy Discharge Summary  Patient Details  Name: Kelly Lowe MRN: [id number] Date of Birth: 1950/08/22  Today's Date: 05/27/2017 PT Individual Time: [address], 1400-1447, 7939-0300 PT Individual Time Calculation (min):  60, 47 and 15 min    Patient has met 7 of 7 long term goals due to improved activity tolerance, improved balance, improved postural control, increased strength, ability to compensate for deficits and functional use of  left lower extremity.  Patient to discharge at a wheelchair level [place].   Patient's care partner is independent to provide the necessary physical assistance at discharge.  All goals met.   Recommendation:  Patient will benefit from ongoing skilled PT services in home health setting to continue to advance safe functional mobility, address ongoing impairments in balance, L inattention strength, motor planning, coordination , and minimize fall risk.  Equipment: hemiwalker and w/c  Reasons for discharge: treatment goals met  Patient/family agrees with progress made and goals achieved: Yes   PT Treatment Interventions: Session 1: Pt sitting in w/c upon PT arrival, agreeable to therapy tx and denies pain. Pt reports she did not sleep well again last and feels very fatigued/weak today. PT discharge summary completed this session with focus on functional mobility. Pt propelled w/c 1 x 150 ft from room>gym supervision, requiring verbal cues for w/c management. Pt transferred w/c<>mat with hemiwalker, stand pivot and min assist. Pt performed sit<>stands x 5 this session with focus on symmetric weightbearing. Pt standing without UE support working on lateral weightshifting from L<>R LE for emphasis on L LE weightbearing. Pt performed stepping forward/backward in place without UE support x 8 each LE with focus on lateral weightshifts. Pt worked on lateral weightshifts in a staggered stance with L LE forward. Pt seated on the mat, therapist performed  scapular mobilizations and soft tissue release secondary to L shoulder pain/tightness. Pt performed car transfer with min assist, stand pivot. Pt transported back to room in w/c total assist. Pt transferred w/c>recliner with min assist stand pivot using hemiwalker. Pt left in recliner with needs in reach.   Session 2: Pt sitting in recliner upon PT arrival, agreeable to therapy tx and denies pain. Pt performed stand pivot transfer with hemi-walker from recliner<>w/c with daughter and min assist. Pt transported to gym in w/c this session for energy conservation. Pt ambulated x 50 ft with min assist using hemiwalker and verbal cues for technique. Pt ascended/descended 4 steps with mod assist, using R handrail, step to pattern. Pt transferred w/c<>mat with min assist, stand pivot using hemiwalker. Pt seated on mat performed x 5 sit<>stands with focus on symmetric LE weightbearing and bring trunk forward. Pt working on standing balance using a mirror for feedback in order to maintain symmetric LE weightbearing with supervision. Pt left seated in recliner at end of session with family present.   Session 3: Pt very emotional this session secondary to finding out news that her mother is in the hospital. Pt agreeable to try therapy this afternoon despite the news. Pt transferred recliner>w/c with min assist, stand pivot using hemi walker. Pt transported to dayroom. Pt ambulated x 20 ft using hemiwalker and min assist, also becoming tearful during this. Pt's daughter and friend present. Therapist and patient agreed to hold therapy this afternoon and spend time with family/friends instead.   PT Discharge Precautions/Restrictions Precautions Precautions: Fall Restrictions Weight Bearing Restrictions: No RLE Partial Weight Bearing Percentage or Pounds: WBAT Other Position/Activity Restrictions: boot no longer required Cognition Overall Cognitive Status: Within Functional Limits  for tasks  assessed Arousal/Alertness: Awake/alert Orientation Level: Oriented X4 Attention: Sustained Sustained Attention: Appears intact Memory: Appears intact Awareness: Appears intact Problem Solving: Impaired Problem Solving Impairment: Functional complex Behaviors:  (episodes of crying) Comments: left inattention  Sensation Sensation Light Touch: Appears Intact Proprioception: Appears Intact Coordination Gross Motor Movements are Fluid and Coordinated: No Fine Motor Movements are Fluid and Coordinated: No Heel Shin Test: WFL R LE, impaired L LE secondary to hemiplegia Motor  Motor Motor: Hemiplegia Motor - Skilled Clinical Observations: L hemiplegia   Trunk/Postural Assessment  Cervical Assessment Cervical Assessment: Exceptions to [facility] (forward head posture) Thoracic Assessment Thoracic Assessment: Exceptions to WFL (kyphotic, R lateral trunk shortening and L lateral trunk elongation) Lumbar Assessment Lumbar Assessment: Within Functional Limits Postural Control Postural Control: Within Functional Limits  Balance Balance Balance Assessed: Yes Static Sitting Balance Static Sitting - Level of Assistance: 6: Modified independent (Device/Increase time) Dynamic Sitting Balance Dynamic Sitting - Level of Assistance: 5: Stand by assistance Static Standing Balance Static Standing - Level of Assistance: 5: Stand by assistance Dynamic Standing Balance Dynamic Standing - Level of Assistance: 4: Min assist Extremity Assessment     RLE Assessment RLE Assessment: Within Functional Limits (grossly 4+/5 throughout) LLE Strength Left Hip Flexion: 3-/5 Left Hip Extension: 3-/5 Left Knee Flexion: 2-/5 Left Knee Extension: 3-/5 Left Ankle Dorsiflexion: 0/5 Left Ankle Plantar Flexion: 1/5   See Function Navigator for Current Functional Status.  Netta Corrigan, PT, DPT 05/27/2017, 11:32 AM

## 2017-05-27 NOTE — Progress Notes (Signed)
[facility]     PROGRESS NOTE  Subjective/Complaints:  Pt seen sitting up in her chair this morning. She did not sleep well overnight. Her daughter is at bedside. Both are tearful, daughter more than patient. The daughter believes that patient's issues with sleep (which is been on and off), poor appetite yesterday, bladder issues, anxiety her due to fluoxetine and she would like fluoxetine discontinued. Later, daughter asks to speak with you personally and tells me that patient's mother recently had a stroke and aortic extremities in the hospital.Shewould like to know this course of action for informing patient.  ROS: Denies CP, SOB, N/V/D.  Objective: Vital Signs: Blood pressure 136/77, pulse 68, temperature 98.4 F (36.9 C), temperature source Oral, resp. rate 16, height 5\' 7"  (1.702 m), weight 83.5 kg (184 lb), SpO2 98 %. No results found.  Recent Labs  05/26/17 0510 05/27/17 0549  WBC 5.2 5.4  HGB 10.4* 11.5*  HCT 31.4* 34.5*  PLT 137* 155    Recent Labs  05/26/17 0510 05/27/17 0549  NA 139 137  K 3.5 3.5  CL 105 105  GLUCOSE 93 88  BUN 5* 6  CREATININE 0.81 0.81  CALCIUM 9.4 9.6   CBG (last 3)  No results for input(s): GLUCAP in the last 72 hours.  Wt Readings from Last 3 Encounters:  05/15/17 83.5 kg (184 lb)  05/03/17 87.8 kg (193 lb 8 oz)    Physical Exam:  BP 136/77 (BP Location: Right Arm)   Pulse 68   Temp 98.4 F (36.9 C) (Oral)   Resp 16   Ht 5\' 7"  (1.702 m)   Wt 83.5 kg (184 lb)   SpO2 98%   BMI 28.82 kg/m  Constitutional: She appears well-developedand well-nourished. NAD. HENT: Normocephalicand atraumatic.  Eyes: EOMI. No discharge.  Cardiovascular: RRR. No JVD. Respiratory: No respiratory distress. She has no wheezes.  GI: She exhibits no distension. BS+ Musculoskeletal: She exhibits no edemaor deformity.  Neurological:  Right gaze preference, improving.  Motor: LUE: 1/5 shoulder abduction, 0/5  distally (unchanged) LLE: 3+/5 HF, 3-/5 KE 3/5, 0/5 distally. (improving) Tone left elbow flexion, ankle plantarflexion Mild dysarthria  Follows commands.  Insight and awareness into deficits. Skin: Skin is warm.  Psychiatric: Normal mood and behavior  Assessment/Plan: 1. Functional deficits secondary to right basal ganglia infarct with recent right foot fracture which require 3+ hours per day of interdisciplinary therapy in a comprehensive inpatient rehab setting. Physiatrist is providing close team supervision and 24 hour management of active medical problems listed below. Physiatrist and rehab team continue to assess barriers to discharge/monitor patient progress toward functional and medical goals.  Function:  Bathing Bathing position   Position: Shower  Bathing parts Body parts bathed by patient: Chest, Abdomen, Right arm, Left arm, Front perineal area, Buttocks, Right upper leg, Left upper leg, Right lower leg, Left lower leg, Back Body parts bathed by helper: Right arm, Left arm, Back  Bathing assist Assist Level: Touching or steadying assistance(Pt > 75%)      Upper Body Dressing/Undressing Upper body dressing   What is the patient wearing?: Bra, Pull over shirt/dress Bra - Perfomed by patient: Thread/unthread right bra strap, Thread/unthread left bra strap Bra - Perfomed by helper: Hook/unhook bra (pull down sports bra) Pull over shirt/dress - Perfomed by patient: Thread/unthread right sleeve, Put head through opening, Pull shirt over trunk, Thread/unthread left sleeve Pull over shirt/dress - Perfomed by helper: Thread/unthread left sleeve  Upper body assist Assist Level:  (Max assist)      Lower Body Dressing/Undressing Lower body dressing   What is the patient wearing?: Pants, Shoes, Non-skid slipper socks, Underwear Underwear - Performed by patient: Thread/unthread right underwear leg, Thread/unthread left underwear leg Underwear - Performed by helper: Pull  underwear up/down Pants- Performed by patient: Thread/unthread right pants leg, Thread/unthread left pants leg, Pull pants up/down Pants- Performed by helper: Pull pants up/down Non-skid slipper socks- Performed by patient: Don/doff right sock (R sock only) Non-skid slipper socks- Performed by helper: Don/doff right sock Socks - Performed by patient: Don/doff right sock, Don/doff left sock   Shoes - Performed by patient: Don/doff left shoe (L shoe only) Shoes - Performed by helper: Don/doff right shoe, Don/doff left shoe, Fasten right, Fasten left   AFO - Performed by helper: Don/doff right AFO      Lower body assist Assist for lower body dressing: Touching or steadying assistance (Pt > 75%)      Toileting Toileting   Toileting steps completed by patient: Adjust clothing prior to toileting Toileting steps completed by helper: Performs perineal hygiene, Adjust clothing after toileting Toileting Assistive Devices: Grab bar or rail  Toileting assist Assist level: Touching or steadying assistance (Pt.75%)   Transfers Chair/bed transfer   Chair/bed transfer method: Stand pivot Chair/bed transfer assist level: Touching or steadying assistance (Pt > 75%) Chair/bed transfer assistive device: Medical sales representative     Max distance: 25' Assist level: Moderate assist (Pt 50 - 74%)   Wheelchair   Type: Manual Max wheelchair distance: 250' Assist Level: Supervision or verbal cues  Cognition Comprehension Comprehension assist level: Follows complex conversation/direction with extra time/assistive device  Expression Expression assist level: Expresses basic 90% of the time/requires cueing < 10% of the time.  Social Interaction Social Interaction assist level: Interacts appropriately 90% of the time - Needs monitoring or encouragement for participation or interaction.  Problem Solving Problem solving assist level: Solves basic problems with no assist  Memory Memory assist  level: Recognizes or recalls 90% of the time/requires cueing < 10% of the time    Medical Problem List and Plan: 1. Left hemiparesis and functional deficitssecondary to right basal ganglia infarct with recent right foot fracture  Cont CIR  WHO/PRAFO better fitting, encouraged complete compliance  Fluoxetine started 10/5, increased on 10/11, d/ced on 10/23  Contacted Ortho, partial WB RLE. Xray reviewed, displacement of 5th digit, but healing. Films eviewed, showing slight increase in callous formation around fracture, orthopedic, boot for comfort as needed.  Baclofen 5 TID started 10/10, DC'd on 10/11 due to lethargy  Trial tizanidine 2 mg 3 times a day, states this also caused drowsiness  TEE relatively unremarkable with mild AI and MR per report 2. DVT Prophylaxis/Anticoagulation: Pharmaceutical: Lovenox 3. Pain Management/Chronic low back pain: Ice /heat for back with ultram prn.   Kpad helps. 4. Mood: LCSW to follow for evaluation and support.  5. Neuropsych: This patient iscapable of making decisions on herown behalf. 6. Skin/Wound Care: routine pressure relief measures. Elevate RLE when seated.  7. Fluids/Electrolytes/Nutrition: Monitor I/O.   BMP within normal range on 10/23 8. Dyslipidemia: Continue lipitor.  9. HTN: Will resume HCTZ if indicated.   Overall controlled on 10/23 Vitals:   05/26/17 1300 05/27/17 0313  BP: 131/68 136/77  Pulse: 69 68  Resp: 18 16  Temp: 99.3 F (37.4 C) 98.4 F (36.9 C)  SpO2: 97% 98%   10. ABLA:   Hb  10.4 on 10/22  Cont to monitor 11. Constipation  Bowel reg increased on 10/3, Senna-s d/ced 10/17  Colace 100 BID started on 10/17  Improving 12. 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  Podiatrist following per daughter 56. Tachypnea/Tachycardia  Resolved 14. Sleep disturbance  Melatonin ordered 10/8  Encouraged improved sleep hygiene, again on 10/18  Improved overall  15. Neurogenic bladder   Bethanechol  5 mg 3 times a day started on 10/11, d/ced 10/15  PVRs with no residual  Will consider anti-spasmodic if patient/daughter interested, they continue to avoid medications  >35 minutes spent with patient and family with >30 minutes in counseling regarding medications, mental health, discharge plans  LOS (Days) 21 A FACE TO FACE EVALUATION WAS PERFORMED  Teresha Hanks Lorie Phenix 05/27/2017 9:01 AM

## 2017-05-27 NOTE — Progress Notes (Signed)
Social Work Patient ID: Kelly Lowe, female   DOB: [birth year], 66 y.o.   MRN: [id number]  Met with pt, daughter, pt's sister and brother in-law to inform her of her Mom being admitted to [facility] with a CVA/MI. Sister has seen her and gave pt a medical update and it was positive for a 65 yo with dementia. Pt cried and asked appropriate questions. Have asked for a chaplain to come in an say a prayer. Had contacted [employer] prior to meeting and they were on their way to pt's room. Family very supportive of one another and pt to go see Mom tomorrow after discharge from here. Provided support and will  see pt in am. Equipment to be delivered to room today.

## 2017-05-27 NOTE — Progress Notes (Signed)
Occupational Therapy Session Note  Patient Details  Name: Kelly Lowe MRN: [id number] Date of Birth: 11-Dec-1950  Today's Date: 05/27/2017 OT Individual Time: 0930-1040 OT Individual Time Calculation (min): 70 min    Skilled Therapeutic Interventions/Progress Updates: patient requested in room shower for today's therapy and participation today as follows:  -   In room Shower transfer via w/c to/from shower chair (cut out bottom) = close S  (No threshold to cross this shower room but she will have one at home and per yesterday's participation with a different OT, stepping forward to her shower chair via cane may be safest)   - upper body bathing=set up of soap and sponge        - LB bathing - Min A due to left hemiparesis and decreased balance with left lateral leans - she completed lateral leans and reaching through the shower chair with cutout to wash her peri area and buttocks  -  UB dressing = min A (overhead method after setup of snaps by this clinician) to pull down on patient's left side -LB dressing= mod A with left AFO and don shoe and sock and assist with pulling up pants in standing on patient's left hip  -oral care=setup  Patient left in w/c with call bell and phone nearby      Therapy Documentation Precautions:  Precautions Precautions: Fall Precaution Comments:   Restrictions Weight Bearing Restrictions: No RLE Weight Bearing: Weight bearing as tolerated RLE Partial Weight Bearing Percentage or Pounds: WBAT Other Position/Activity Restrictions: boot no longer required Pain:denied       Therapy/Group: Individual Therapy  Alfredia Ferguson [facility] 05/27/2017, 1:23 PM

## 2017-05-27 NOTE — Progress Notes (Signed)
Patient with daughter and sister and sisters husband. Patient prepared to be discharged Wed. And is upset over learning about her mother in hospital in [place].  Patient sister is tech savy and may be able to set up skype so they can see one another while waiting for the patient to be discharged. Shared some scripture and gave New Testament and had prayer with family present. This seemed to calm the patient as she was very upset. Conard Novak, Chaplain   05/27/17 1500  Clinical Encounter Type  Visited With Patient and family together  Visit Type Initial;Spiritual support;Other (Comment)  Referral From Nurse  Consult/Referral To Chaplain  Spiritual Encounters  Spiritual Needs Sacred text;Prayer  Stress Factors  Patient Stress Factors Other (Comment) (Patient upset over her mother in [place] stroke/heart attack)  Family Stress Factors Other (Comment) (Family upset over patient mother in hospital [place])

## 2017-05-27 NOTE — Progress Notes (Signed)
Occupational Therapy Discharge Summary  Patient Details  Name: Kelly Lowe MRN: [id number] Date of Birth: 03/22/1951     Patient has met 12 of 12 long term goals due to improved activity tolerance, improved balance, postural control, ability to compensate for deficits, functional use of  LEFT upper and LEFT lower extremity,  and improved coordination.  Patient to discharge at overall Min Assist level.  Patient's care partner is independent to provide the necessary physical assistance at discharge.    Reasons goals not met: n/a  Recommendation:  Patient will benefit from ongoing skilled OT services in home health setting to continue to advance functional skills in the area of BADL and iADL.  Equipment: Bedside commode  Reasons for discharge: treatment goals met  Patient/family agrees with progress made and goals achieved: Yes  OT Discharge  ADL  min A overall Vision Baseline Vision/History: Wears glasses Wears Glasses: Reading only Patient Visual Report: No change from baseline Vision Assessment?: No apparent visual deficits Perception   WFL Praxis  WFL Cognition  WFL Sensation Sensation Hot/Cold: Appears Intact Coordination Finger Nose Finger Test: Brunstrom stage 2 Motor   hemiplegia Mobility    min A with transfers and ambulation with hemiwalker for short distances into the bathroom Trunk/Postural Assessment    WFL for static standing and dynamic sitting Balance  min A standing balance Extremity/Trunk Assessment RUE Assessment RUE Assessment: Within Functional Limits LUE Assessment LUE Assessment:  (Pt now has some active sh abduction and elbow flexion.  Brunstrom stage 2. )   See Function Navigator for Current Functional Status.  SAGUIER,JULIA 05/27/2017, 3:33 PM 

## 2017-05-28 MED ORDER — TRAMADOL HCL 50 MG PO TABS: 50.0000 mg | ORAL_TABLET | Freq: Four times a day (QID) | ORAL | 0 refills | Status: DC | PRN

## 2017-05-28 MED ORDER — FLUOXETINE HCL 20 MG PO CAPS: 20.0000 mg | ORAL_CAPSULE | Freq: Every day | ORAL | 3 refills | Status: DC

## 2017-05-28 MED ORDER — TIZANIDINE HCL 2 MG PO TABS: 2.0000 mg | ORAL_TABLET | Freq: Three times a day (TID) | ORAL | 0 refills | Status: DC | PRN

## 2017-05-28 MED ORDER — NICOTINE 14 MG/24HR TD PT24: MEDICATED_PATCH | TRANSDERMAL | 0 refills | Status: DC

## 2017-05-28 MED ORDER — DICLOFENAC SODIUM 1 % TD GEL: 2.0000 g | Freq: Four times a day (QID) | TRANSDERMAL | 1 refills | Status: DC

## 2017-05-28 MED ORDER — MELATONIN 3 MG PO TABS: 3.0000 mg | ORAL_TABLET | Freq: Every day | ORAL | 0 refills | Status: DC

## 2017-05-28 MED ORDER — DOCUSATE SODIUM 100 MG PO CAPS: 100.0000 mg | ORAL_CAPSULE | Freq: Two times a day (BID) | ORAL | 0 refills | Status: DC

## 2017-05-28 MED ORDER — FLUOXETINE HCL 20 MG PO CAPS
20.0000 mg | ORAL_CAPSULE | Freq: Every day | ORAL | Status: DC
  Administered 2017-05-28: 20 mg via ORAL
  Filled 2017-05-28: qty 1

## 2017-05-28 MED ORDER — ATORVASTATIN CALCIUM 40 MG PO TABS: 40.0000 mg | ORAL_TABLET | Freq: Every day | ORAL | 0 refills | Status: AC

## 2017-05-28 NOTE — Progress Notes (Signed)
Social Work  Discharge Note  The overall goal for the admission was met for:   Discharge location: [place]  Length of Stay: Yes-22 DAYS  Discharge activity level: Yes-SUPERVISION/MIN LEVEL  Home/community participation: Yes  Services provided included: MD, RD, PT, OT, SLP, RN, CM, TR, Pharmacy, Neuropsych and SW  Financial Services: Medicare and Private Insurance: [facility]  Follow-up services arranged: Home Health: [facility], DME: [facility], HEMI-WALKER, [address] [place] and Patient/Family has no preference for HH/DME agencies  Comments (or additional information):PT GOING TO DAUGHTER'S HOME WHERE SHE WILL BE PROVIDING 24 HR CARE. HERE DAILY TO ATTEND TRAINING AND LEARN PT'S CARE. [place] TO FOLLOW FOR COPING. TRANSITION TO OP ONCE READY. CONCERNED LARI DOESN'T FULLY REALIZE HOW MUCH CARE PT IS DUE TO IN AND OUT A LOT OF THE THERAPY TIME.  Patient/Family verbalized understanding of follow-up arrangements: Yes  Individual responsible for coordination of the follow-up plan: SELF & LARI-DAUGHTER  Confirmed correct DME delivered: [name] 05/28/2017    [name]

## 2017-05-28 NOTE — Discharge Instructions (Signed)
Inpatient Rehab Discharge Instructions  Kelly Lowe Discharge date and time:    Activities/Precautions/ Functional Status: Activity: no lifting, driving, or strenuous exercise till cleared by MD Diet: cardiac diet Wound Care: none needed   Functional status:  ___ No restrictions     ___ Walk up steps independently _X__ 24/7 supervision/assistance   ___ Walk up steps with assistance ___ Intermittent supervision/assistance  ___ Bathe/dress independently ___ Walk with walker     _X__ Bathe/dress with assistance ___ Walk Independently    ___ Shower independently ___ Walk with assistance    ___ Shower with assistance _X__ No alcohol     ___ Return to work/school ________   Special Instructions:    COMMUNITY REFERRALS UPON DISCHARGE:    Home Health:   PT, OT, RN , AIDE  Agency:[facility]   Phone:[phone]   Date of last service:05/28/2017  Medical Equipment/Items Ordered:WHEELCHAIR, Sofie Hartigan BENCH & 3 IN 1  Agency/Supplier:[facility]   [id number] Other:NEURO-PSYCH FOLLOW UP VIA [facility]-REFERRAL FORM FAXED INTO THEM 05/26/2017  GENERAL COMMUNITY RESOURCES FOR PATIENT/FAMILY: Support Groups:[facility] [place] [address]   FIRST Monday OF [place] @ 6:30-8:00 PM QUESTIONS CONTACT [phone]   STROKE/TIA DISCHARGE INSTRUCTIONS SMOKING Cigarette smoking nearly doubles your risk of having a stroke & is the single most alterable risk factor  If you smoke or have smoked in the last 12 months, you are advised to quit smoking for your health.  Most of the excess cardiovascular risk related to smoking disappears within a year of stopping.  Ask you doctor about anti-smoking medications  [place] Quit Line: 1-800-QUIT NOW  Free Smoking Cessation Classes [phone]  CHOLESTEROL Know your levels; limit fat & cholesterol in your diet  Lipid Panel     Component Value Date/Time   CHOL 174 05/04/2017 0535    TRIG 153 (H) 05/04/2017 0535   HDL 29 (L) 05/04/2017 0535   CHOLHDL 6.0 05/04/2017 0535   VLDL 31 05/04/2017 0535   LDLCALC 114 (H) 05/04/2017 0535      Many patients benefit from treatment even if their cholesterol is at goal.  Goal: Total Cholesterol (CHOL) less than 160  Goal:  Triglycerides (TRIG) less than 150  Goal:  HDL greater than 40  Goal:  LDL (LDLCALC) less than 100   BLOOD PRESSURE American Stroke Association blood pressure target is less that 120/80 mm/Hg  Your discharge blood pressure is:  BP: 133/65  Monitor your blood pressure  Limit your salt and alcohol intake  Many individuals will require more than one medication for high blood pressure  DIABETES (A1c is a blood sugar average for last 3 months) Goal HGBA1c is under 7% (HBGA1c is blood sugar average for last 3 months)  Diabetes: No known diagnosis of diabetes    Lab Results  Component Value Date   HGBA1C 5.4 05/04/2017     Your HGBA1c can be lowered with medications, healthy diet, and exercise.  Check your blood sugar as directed by your physician  Call your physician if you experience unexplained or low blood sugars.  PHYSICAL ACTIVITY/REHABILITATION Goal is 30 minutes at least 4 days per week  Activity: No driving, Therapies: see above Return to work: N/A  Activity decreases your risk of heart attack and stroke and makes your heart stronger.  It helps control your weight and blood pressure; helps you relax and can improve your mood.  Participate in a regular exercise program.  Talk with your doctor about the best form of exercise for you (dancing, walking, swimming, cycling).  DIET/WEIGHT Goal is to maintain a healthy weight  Your discharge diet is: DIET DYS 3 Room service appropriate? Yes; Fluid consistency: Thin liquids Your height is:  Height: 5\' 7"  (170.2 cm) Your current weight is: Weight: 81.2 kg (179 lb) Your Body Mass Index (BMI) is:  BMI (Calculated): 28.03  Following the type of  diet specifically designed for you will help prevent another stroke.  Your goal weight is:  159 lbs  Your goal Body Mass Index (BMI) is 19-24.  Healthy food habits can help reduce 3 risk factors for stroke:  High cholesterol, hypertension, and excess weight.  RESOURCES Stroke/Support Group:  Call [phone]   STROKE EDUCATION PROVIDED/REVIEWED AND GIVEN TO PATIENT Stroke warning signs and symptoms How to activate emergency medical system (call 911). Medications prescribed at discharge. Need for follow-up after discharge. Personal risk factors for stroke. Pneumonia vaccine given:  Flu vaccine given:  My questions have been answered, the writing is legible, and I understand these instructions.  I will adhere to these goals & educational materials that have been provided to me after my discharge from the hospital.     My questions have been answered and I understand these instructions. I will adhere to these goals and the provided educational materials after my discharge from the hospital.  Patient/Caregiver Signature _______________________________ Date __________  Clinician Signature _______________________________________ Date __________  Please bring this form and your medication list with you to all your follow-up doctor's appointments.

## 2017-05-28 NOTE — Progress Notes (Signed)
[facility] PHYSICAL MEDICINE & REHABILITATION     PROGRESS NOTE  Subjective/Complaints:  Pt seen sitting up in her chair this AM.  Daughter at bedside.  Pt slept well overnight.  She is ready for discharge.    ROS: Denies CP, SOB, N/V/D.  Objective: Vital Signs: Blood pressure 131/79, pulse 76, temperature 98 F (36.7 C), temperature source Oral, resp. rate 16, height 5\' 7"  (1.702 m), weight 83.6 kg (184 lb 3.5 oz), SpO2 97 %. No results found.  Recent Labs  05/26/17 0510 05/27/17 0549  WBC 5.2 5.4  HGB 10.4* 11.5*  HCT 31.4* 34.5*  PLT 137* 155    Recent Labs  05/26/17 0510 05/27/17 0549  NA 139 137  K 3.5 3.5  CL 105 105  GLUCOSE 93 88  BUN 5* 6  CREATININE 0.81 0.81  CALCIUM 9.4 9.6   CBG (last 3)  No results for input(s): GLUCAP in the last 72 hours.  Wt Readings from Last 3 Encounters:  05/28/17 83.6 kg (184 lb 3.5 oz)  05/03/17 87.8 kg (193 lb 8 oz)    Physical Exam:  BP 131/79 (BP Location: Right Arm)   Pulse 76   Temp 98 F (36.7 C) (Oral)   Resp 16   Ht 5\' 7"  (1.702 m)   Wt 83.6 kg (184 lb 3.5 oz)   SpO2 97%   BMI 28.85 kg/m  Constitutional: She appears well-developedand well-nourished. NAD. HENT: Normocephalicand atraumatic.  Eyes: EOMI. No discharge.  Cardiovascular: RRR. No JVD. Respiratory: No respiratory distress. She has no wheezes.  GI: She exhibits no distension. BS+ Musculoskeletal: She exhibits no edemaor deformity.  Neurological:  Right gaze preference, improving.  Motor: LUE: 1/5 shoulder abduction, 0/5 distally (stable) LLE: 3+/5 HF, 3-/5 KE 3/5, 0/5 distally (stable). Tone left elbow flexion, ankle plantarflexion Mild dysarthria  Follows commands.  Insight and awareness into deficits. Skin: Skin is warm.  Psychiatric: Normal mood and behavior  Assessment/Plan: 1. Functional deficits secondary to right basal ganglia infarct with recent right foot fracture which require 3+ hours per day of interdisciplinary therapy in  a comprehensive inpatient rehab setting. Physiatrist is providing close team supervision and 24 hour management of active medical problems listed below. Physiatrist and rehab team continue to assess barriers to discharge/monitor patient progress toward functional and medical goals.  Function:  Bathing Bathing position   Position: Shower  Bathing parts Body parts bathed by patient: Right arm, Left arm, Chest, Abdomen, Front perineal area, Buttocks, Right upper leg, Left upper leg, Right lower leg, Back Body parts bathed by helper: Left lower leg  Bathing assist Assist Level: Touching or steadying assistance(Pt > 75%)      Upper Body Dressing/Undressing Upper body dressing   What is the patient wearing?: Bra, Pull over shirt/dress Bra - Perfomed by patient: Thread/unthread right bra strap, Thread/unthread left bra strap Bra - Perfomed by helper: Hook/unhook bra (pull down sports bra) Pull over shirt/dress - Perfomed by patient: Thread/unthread right sleeve, Thread/unthread left sleeve, Put head through opening, Pull shirt over trunk Pull over shirt/dress - Perfomed by helper: Thread/unthread left sleeve        Upper body assist Assist Level: Touching or steadying assistance(Pt > 75%)      Lower Body Dressing/Undressing Lower body dressing   What is the patient wearing?: Underwear, Pants, Socks, Shoes, AFO Underwear - Performed by patient: Thread/unthread right underwear leg, Thread/unthread left underwear leg Underwear - Performed by helper: Pull underwear up/down Pants- Performed by patient: Thread/unthread right pants  leg, Thread/unthread left pants leg Pants- Performed by helper: Pull pants up/down Non-skid slipper socks- Performed by patient: Don/doff right sock (R sock only) Non-skid slipper socks- Performed by helper: Don/doff right sock Socks - Performed by patient: Don/doff right sock, Don/doff left sock   Shoes - Performed by patient: Don/doff right shoe Shoes -  Performed by helper: Don/doff left shoe, Fasten left, Fasten right   AFO - Performed by helper: Don/doff left AFO      Lower body assist Assist for lower body dressing: Touching or steadying assistance (Pt > 75%)      Toileting Toileting Toileting activity did not occur: N/A (did not occur during this session today) Toileting steps completed by patient: Adjust clothing prior to toileting Toileting steps completed by helper: Performs perineal hygiene, Adjust clothing after toileting Toileting Assistive Devices: Grab bar or rail  Toileting assist Assist level: Touching or steadying assistance (Pt.75%)   Transfers Chair/bed transfer   Chair/bed transfer method: Stand pivot Chair/bed transfer assist level: Touching or steadying assistance (Pt > 75%) Chair/bed transfer assistive device:  (hemi walker)     Locomotion Ambulation     Max distance: 51 ft Assist level: Touching or steadying assistance (Pt > 75%)   Wheelchair   Type: Manual Max wheelchair distance: 150 ft Assist Level: No help, No cues, assistive device, takes more than reasonable amount of time  Cognition Comprehension Comprehension assist level: Follows complex conversation/direction with no assist  Expression Expression assist level: Expresses complex ideas: With no assist  Social Interaction Social Interaction assist level: Interacts appropriately with others with medication or extra time (anti-anxiety, antidepressant).  Problem Solving Problem solving assist level: Solves complex 90% of the time/cues < 10% of the time  Memory Memory assist level: More than reasonable amount of time    Medical Problem List and Plan: 1. Left hemiparesis and functional deficitssecondary to right basal ganglia infarct with recent right foot fracture  D/c today  Will see patient for transitional care management in 1-2 weeks  WHO/PRAFO better fitting  Fluoxetine started 10/5, increased on 10/11, d/ced on 10/23, restarted 10/24 (per  pt)  Contacted Ortho, partial WB RLE. Xray reviewed, displacement of 5th digit, but healing. Films eviewed, showing slight increase in callous formation around fracture, orthopedic, boot for comfort as needed.  Baclofen 5 TID started 10/10, DC'd on 10/11 due to lethargy  Trial tizanidine 2 mg 3 times a day, states this also caused drowsiness  TEE relatively unremarkable with mild AI and MR per report 2. DVT Prophylaxis/Anticoagulation: Pharmaceutical: Lovenox 3. Pain Management/Chronic low back pain: Ice /heat for back with ultram prn.   Kpad helps. 4. Mood: LCSW to follow for evaluation and support.  5. Neuropsych: This patient iscapable of making decisions on herown behalf. 6. Skin/Wound Care: routine pressure relief measures. Elevate RLE when seated.  7. Fluids/Electrolytes/Nutrition: Monitor I/O.   BMP within normal range on 10/23 8. Dyslipidemia: Continue lipitor.  9. HTN: Will resume HCTZ if indicated.   Overall controlled on 10/24 Vitals:   05/27/17 1320 05/28/17 0300  BP: 135/64 131/79  Pulse: 63 76  Resp: 16 16  Temp: 98.9 F (37.2 C) 98 F (36.7 C)  SpO2: 96% 97%  10. ABLA:   Hb 10.4 on 10/22  Cont to monitor 11. Constipation  Bowel reg increased on 10/3, Senna-s d/ced 10/17  Colace 100 BID started on 10/17  Improving 12. 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  Podiatrist following per daughter 55. Tachypnea/Tachycardia  Resolved 14. Sleep disturbance  Melatonin ordered 10/8  Encouraged improved sleep hygiene, again on 10/18  Improved overall  15. Neurogenic bladder   Bethanechol 5 mg 3 times a day started on 10/11, d/ced 10/15  PVRs with no residual  Will consider anti-spasmodic if patient/daughter interested, they continue to avoid medications  >30 minutes spent with patient and family with >25 minutes in addressing discharge medications, timing, medication side effects, follow up appointments, mental health, etc.   LOS (Days) 22 A  FACE TO FACE EVALUATION WAS PERFORMED  Ankit Lorie Phenix 05/28/2017 8:08 AM

## 2017-05-28 NOTE — Progress Notes (Signed)
Pt. Got D/C instructions,follow up appointments and equipments.Pt. Is ready to go home with family.

## 2017-05-28 NOTE — Progress Notes (Signed)
Team Conference noted for today. Pt resting in bed quietly. Easily aroused. Denies any pain at this time. Left side is flaccid s/p CVA. Dtr at bedside and pt is due to discharge this am. Dtr requesting to have pt shower in the am prior to discharge and was educated that she will be assisted to shower by nursing. Am shift notified of request. Pt continues to be continent of b/b and believes that she her urine "smells bad possibly from the medications that I'm taking." Reassurance given to pt this shift. Safety maintained. Callbell within reach. Will continue to monitor.

## 2017-05-30 ENCOUNTER — Telehealth: Payer: Self-pay

## 2017-05-30 NOTE — Telephone Encounter (Signed)
error 

## 2017-06-04 NOTE — Discharge Summary (Signed)
Physician Discharge Summary  Patient ID: Kelly Lowe MRN: [id number] DOB/AGE: 1950/08/24 66 y.o.  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 EXAM: RIGHT FOOT COMPLETE - 3+ VIEW COMPARISON:  05/08/2015.  03/16/2017 FINDINGS: Angulated display fracture of the right fifth metatarsal again noted. Slight increasing callus formation noted . Diffuse osteopenia degenerative change IMPRESSION: Slight increase in callus formation noted about the right fifth metatarsal fracture. Electronically Signed   By: Marcello Moores  Register   On: 05/23/2017 07:45   Dg Foot Complete Right  Result Date: 05/07/2017 CLINICAL DATA:  Follow-up right fifth metatarsal fracture. EXAM: RIGHT FOOT COMPLETE - 3+ VIEW COMPARISON:  03/16/2017 right foot radiographs. FINDINGS: Stable mild malalignment of comminuted distal shaft fracture in the right fifth metatarsal with 3 mm medial displacement of the dominant distal fracture fragment, with evidence of interval healing including endosteal and periosteal callus and decreased visualization of the fracture lucency. No additional fracture. No dislocation. No suspicious focal osseous lesions. 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 Diffuse osteopenia. No radiopaque foreign body. IMPRESSION: Healing distal shaft fracture in the right fifth metatarsal with stable  mild displacement. Diffuse osteopenia. Electronically Signed   By: Ilona Sorrel M.D.   On: 05/07/2017 20:47    Labs:  Basic Metabolic Panel: BMP Latest Ref Rng & Units 05/27/2017 05/26/2017 05/20/2017  Glucose 65 - 99 mg/dL 88 93 98  BUN 6 - 20 mg/dL 6 5(L) 7  Creatinine 0.44 - 1.00 mg/dL 0.81 0.81 0.86  Sodium 135 - 145 mmol/L 137 139 141  Potassium 3.5 - 5.1 mmol/L 3.5 3.5 3.5  Chloride 101 - 111 mmol/L 105 105 105  CO2 22 - 32 mmol/L 24 26 27   Calcium 8.9 - 10.3 mg/dL 9.6 9.4 10.1    CBC: CBC Latest Ref Rng & Units 05/27/2017 05/26/2017 05/20/2017  WBC 4.0 - 10.5 K/uL 5.4 5.2 4.7  Hemoglobin 12.0 - 15.0 g/dL 11.5(L) 10.4(L) 11.9(L)  Hematocrit 36.0 - 46.0 % 34.5(L) 31.4(L) 36.6  Platelets 150 - 400 K/uL 155 137(L) 171    CBG: No results for input(s): GLUCAP in the last 168 hours.  Brief HPI:   DUCHESS ARMENDAREZ a 66 y.o.femalewith history of HTN, right foot fractures 8/ CTA head/neck revealed intracranial atherosclerosis involving R>L PCA.MRI brain done revealing Non-hemorrhagic infarct in right basal ganglia, punctate infarct in left splenium and moderate underlying small vessel disease.BLE dopplers done due to reports of right calf swelling/injury and revealed small focal area of intermuscular thrombus in mid right gastrocnemius vein. Dr. Leonie Man recommends ASA for embolic stroke due to unknown source and will need TEE/loop. TCD without hits or evidence of PFO. Patient with resultant deficits in mobility and self care tasks due to dense left hemiparesis with sensory deficits, right gaze preference, decrease awareness of deficits as well as delayed processing due to higher level deficits. CIR recommended for follow up therapy.    Hospital Course: AVEYAH GREENWOOD  was admitted to rehab 05/06/2017 for inpatient therapies to consist of PT, ST and OT at least three hours five days a week. Past admission physiatrist, therapy team and rehab RN have worked together to provide  customized collaborative inpatient rehab.TEE was done 10/4 and showed normal LVF and was negative for SOE. Ortho was contacted for input on foot fracture during her stay with serial X rays done for monitoring. She was advanced to [facility] and boot was discontinued by 10/19 as repeat films showed increase in callus formation.  Prozac was added to help with motor recovery and titrated upwards to 10 mg but later was discontinued on 10/23 due to family request. Constipation has resolved with increase in intake and activity.     Check of lytes showed renal status to be stable. Blood pressures were monitored on bid basis and have been controlled off medications. She has been limited by spasticity LUE but was unable to tolerate baclofen or Zanaflex due to sedative side effects. Chronic low back pain has been managed with local measures. Ego support has been provided by team and family during her stay.  She has had issues with frequency due to neurogenic bladder but family was not interested in anti-spasmodic trial. Urine culture was negative for infection. Dr. Sima Matas has been following to work on issues related to anxiety and depressive responses. She is showing some motor return LUE- Brunstrom 2 and has progressed to min assist at wheelchair level. She will continue to receive follow up [place], [place], [place] and [place] by [facility].     Rehab course: During patient's stay in rehab weekly team conferences were held to monitor patient's progress, set goals and discuss barriers to discharge. At admission, patient required max assist with basic self care tasks and for mobility. She exhibited mild higher level cognitive deficits and mild oral dysphagia.  Patient has had improvement in activity tolerance, balance, postural control, as well as ability to compensate for deficits. She is has had improvement in functional use LUE  and LLE as well as improved awareness She is able to complete UB bathing with set up assist  and need min assist for UB dressing and LB bathing tasks she requires mod assist to don AFO and shoe. She is able to propel her wheelchair with supervision. She requires min assist for stand pivot transfers. She is able to ambulate 47' with hemi-waker and min assist with cues for technique. She was advanced to regular textures as dysphagia improved. She is tolerating this without difficulty. She is able to complete higher level cognitive tasks at modified independent level and speech therapy signed off on 10/10.  Family education was completed regarding all aspects of care and will provide assistance after discharge.    Disposition: 01-Home or Self Care  Diet: Regular.   Special Instructions: 1. Follow up with psychologist for support.  2. Need to follow up with cardiology for placement of loop recorder.   Discharge Instructions    Ambulatory referral to Physical Medicine Rehab    Complete by:  As directed    Complexity follow-up 1-2 weeks right basal ganglia embolic CVA     Discharge Medication List as of 05/28/2017  5:53 AM    START taking these medications   Details  diclofenac sodium (VOLTAREN) 1 % GEL Apply 2 g topically 4 (four) times daily., Starting Wed 05/28/2017, Print    docusate sodium (COLACE) 100 MG capsule Take 1 capsule (100 mg total) by mouth 2 (two) times daily.,  Starting Wed 05/28/2017, No Print    FLUoxetine (PROZAC) 20 MG capsule Take 1 capsule (20 mg total) by mouth daily., Starting Wed 05/28/2017, Print    Melatonin 3 MG TABS Take 1 tablet (3 mg total) by mouth at bedtime., Starting Wed 05/28/2017, Print    tiZANidine (ZANAFLEX) 2 MG tablet Take 1 tablet (2 mg total) by mouth every 8 (eight) hours as needed (Spasticity)., Starting Wed 05/28/2017, Print    traMADol (ULTRAM) 50 MG tablet Take 1 tablet (50 mg total) by mouth every 6 (six) hours as needed for moderate pain., Starting Wed 05/28/2017, Print Rx # 30 pills      CONTINUE these medications which have  CHANGED   Details  atorvastatin (LIPITOR) 40 MG tablet Take 1 tablet (40 mg total) by mouth daily at 6 PM., Starting Wed 05/28/2017, Print    nicotine (NICODERM CQ - DOSED IN MG/24 HOURS) 14 mg/24hr patch 14 mg patch daily 2 weeks then 7 mg patch daily 3 weeks and stop, Print      CONTINUE these medications which have NOT CHANGED   Details  acetaminophen (TYLENOL) 325 MG tablet Take 2 tablets (650 mg total) by mouth every 6 (six) hours as needed for mild pain (or Fever >/= 101)., Starting Tue 05/06/2017, OTC    amLODipine (NORVASC) 5 MG tablet Take 5 mg by mouth daily., Starting Fri 04/18/2017, Historical Med    aspirin 325 MG tablet Take 1 tablet (325 mg total) by mouth daily., Starting Wed 05/07/2017, Normal    BIOTIN PO Take 1 tablet by mouth daily., Historical Med    Cyanocobalamin (VITAMIN B 12 PO) Take 1 tablet by mouth daily., Historical Med    hydrochlorothiazide (HYDRODIURIL) 12.5 MG tablet Take 12.5 mg by mouth every morning., Starting Fri 03/14/2017, Historical Med    Multiple Vitamin (MULTIVITAMIN WITH MINERALS) TABS tablet Take 1 tablet by mouth daily., Historical Med    protein supplement shake (PREMIER PROTEIN) LIQD Take 59.1 mLs (2 oz total) by mouth 4 (four) times daily., Starting Tue 05/06/2017, No Print      STOP taking these medications     acetaminophen (TYLENOL) 650 MG suppository      ibuprofen (ADVIL,MOTRIN) 200 MG tablet        Follow-up Information    Jamse Arn, MD Follow up.   Specialty:  Physical Medicine and Rehabilitation Contact information: [place] [address] [phone]        Garvin Fila, MD Follow up.   Specialties:  Neurology, Radiology Contact information: [address] [place] [address] [phone]        Elsie Saas, MD Follow up.   Specialty:  Orthopedic Surgery Contact information: [place] [address] [phone]         Carol Ada, MD Follow up on 06/03/2017.   Specialty:  Family Medicine Why:  Appointment @ 12:15 pm Contact information: [place] [address] [phone]           Signed: Bary Leriche 06/06/2017, 6:02 PM

## 2017-06-05 ENCOUNTER — Encounter: Payer: 59 | Attending: Physical Medicine & Rehabilitation | Admitting: Physical Medicine & Rehabilitation

## 2017-06-05 DIAGNOSIS — R269 Unspecified abnormalities of gait and mobility: Secondary | ICD-10-CM | POA: Insufficient documentation

## 2017-06-05 DIAGNOSIS — G479 Sleep disorder, unspecified: Secondary | ICD-10-CM | POA: Insufficient documentation

## 2017-06-05 DIAGNOSIS — X58XXXD Exposure to other specified factors, subsequent encounter: Secondary | ICD-10-CM | POA: Insufficient documentation

## 2017-06-05 DIAGNOSIS — F17210 Nicotine dependence, cigarettes, uncomplicated: Secondary | ICD-10-CM | POA: Insufficient documentation

## 2017-06-05 DIAGNOSIS — S92901D Unspecified fracture of right foot, subsequent encounter for fracture with routine healing: Secondary | ICD-10-CM | POA: Insufficient documentation

## 2017-06-05 DIAGNOSIS — I10 Essential (primary) hypertension: Secondary | ICD-10-CM | POA: Insufficient documentation

## 2017-06-19 ENCOUNTER — Encounter: Payer: 59 | Admitting: Physical Medicine & Rehabilitation

## 2017-06-19 ENCOUNTER — Other Ambulatory Visit: Payer: Self-pay

## 2017-06-19 ENCOUNTER — Encounter: Payer: Self-pay | Admitting: Physical Medicine & Rehabilitation

## 2017-06-19 ENCOUNTER — Telehealth: Payer: Self-pay | Admitting: *Deleted

## 2017-06-19 VITALS — BP 137/77 | HR 65

## 2017-06-19 DIAGNOSIS — I639 Cerebral infarction, unspecified: Secondary | ICD-10-CM | POA: Diagnosis not present

## 2017-06-19 DIAGNOSIS — I6389 Other cerebral infarction: Secondary | ICD-10-CM | POA: Diagnosis present

## 2017-06-19 DIAGNOSIS — I10 Essential (primary) hypertension: Secondary | ICD-10-CM

## 2017-06-19 DIAGNOSIS — G479 Sleep disorder, unspecified: Secondary | ICD-10-CM | POA: Diagnosis not present

## 2017-06-19 DIAGNOSIS — I69359 Hemiplegia and hemiparesis following cerebral infarction affecting unspecified side: Secondary | ICD-10-CM

## 2017-06-19 DIAGNOSIS — I69319 Unspecified symptoms and signs involving cognitive functions following cerebral infarction: Secondary | ICD-10-CM

## 2017-06-19 DIAGNOSIS — S92901S Unspecified fracture of right foot, sequela: Secondary | ICD-10-CM

## 2017-06-19 DIAGNOSIS — F4321 Adjustment disorder with depressed mood: Secondary | ICD-10-CM

## 2017-06-19 DIAGNOSIS — R269 Unspecified abnormalities of gait and mobility: Secondary | ICD-10-CM

## 2017-06-19 DIAGNOSIS — X58XXXD Exposure to other specified factors, subsequent encounter: Secondary | ICD-10-CM | POA: Diagnosis not present

## 2017-06-19 DIAGNOSIS — G8110 Spastic hemiplegia affecting unspecified side: Secondary | ICD-10-CM | POA: Diagnosis not present

## 2017-06-19 DIAGNOSIS — S92901D Unspecified fracture of right foot, subsequent encounter for fracture with routine healing: Secondary | ICD-10-CM | POA: Diagnosis not present

## 2017-06-19 DIAGNOSIS — F17210 Nicotine dependence, cigarettes, uncomplicated: Secondary | ICD-10-CM | POA: Diagnosis not present

## 2017-06-19 NOTE — Progress Notes (Signed)
Subjective:    Patient ID: Kelly Lowe, female    DOB: [birth year], 66 y.o.   MRN: [id number]  HPI  66 y.o. female with history of HTN, right foot fractures presents for hospital follow for right basal ganglia infarct.   Admit date: 05/06/2017 Discharge date: 05/28/2017  Presents with daughter, who provides much of history. At discharge, pt was instructed to follow up with Psych, which she has done. She has not followed up with Cardiology. She has not scheduled an appointment with Neurology. She has not scheduled with Ortho. She saw her PCP.  She states her right foot continues to cause as times.  Bowel movements have improved. Her sleep has significantly improved as well. Denies bladder symptoms.   Therapies: 2/week DME: Bedside commode, shower chair Mobility: Hemiwalker at home, Wheelchair in community and in house at times.   Pain Inventory Average Pain 4 Pain Right Now 3 My pain is constant and aching  In the last 24 hours, has pain interfered with the following? General activity 0 Relation with others 0 Enjoyment of life 1 What TIME of day is your pain at its worst? morning Sleep (in general) Poor  Pain is worse with: unsure and some activites Pain improves with: rest and therapy/exercise Relief from Meds: 2  Mobility walk without assistance use a walker how many minutes can you walk? 4 ability to climb steps?  yes do you drive?  no use a wheelchair needs help with transfers Do you have any goals in this area?  yes  Function disabled: date disabled 05/03/17 I need assistance with the following:  dressing, bathing, toileting, meal prep, household duties and shopping Do you have any goals in this area?  yes  Neuro/Psych weakness numbness tingling trouble walking dizziness depression anxiety  Prior Studies Any changes since last visit?  no  Physicians involved in your care Any changes since last visit?  no   No family history on file. Social  History   Socioeconomic History  . Marital status: Legally Separated    Spouse name: None  . Number of children: None  . Years of education: None  . Highest education level: None  Social Needs  . Financial resource strain: None  . Food insecurity - worry: None  . Food insecurity - inability: None  . Transportation needs - medical: None  . Transportation needs - non-medical: None  Occupational History  . None  Tobacco Use  . Smoking status: Current Every Day Smoker    Packs/day: 0.50    Years: 25.00    Pack years: 12.50    Types: Cigarettes  . Smokeless tobacco: Never Used  Substance and Sexual Activity  . Alcohol use: Yes    Alcohol/week: 0.6 oz    Types: 1 Glasses of wine per week    Comment: social  . Drug use: No  . Sexual activity: No  Other Topics Concern  . None  Social History Narrative  . None   Past Surgical History:  Procedure Laterality Date  . FOOT SURGERY    . TEE WITHOUT CARDIOVERSION N/A 05/08/2017   Procedure: TRANSESOPHAGEAL ECHOCARDIOGRAM (TEE);  Surgeon: Lelon Perla, MD;  Location: [facility] ENDOSCOPY;  Service: Cardiovascular;  Laterality: N/A;   Past Medical History:  Diagnosis Date  . Hypertension    BP 137/77   Pulse 65   SpO2 94%   Opioid Risk Score:   Fall Risk Score:  `1  Depression screen PHQ 2/9  Depression screen [facility] 2/9 06/19/2017  06/19/2017  Decreased Interest 0 0  Down, Depressed, Hopeless 0 0  PHQ - 2 Score 0 0  Altered sleeping 3 -  Tired, decreased energy 2 -  Change in appetite 1 -  Feeling bad or failure about yourself  1 -  Trouble concentrating 1 -  Moving slowly or fidgety/restless 1 -  Suicidal thoughts 0 -  PHQ-9 Score 9 -  Difficult doing work/chores Not difficult at all -      Review of Systems  Constitutional: Positive for chills and fever.  HENT: Negative.   Eyes: Negative.   Respiratory: Negative.   Cardiovascular: Positive for leg swelling.  Gastrointestinal: Positive for constipation.        Poor appetite  Endocrine: Negative.   Genitourinary: Negative.   Musculoskeletal: Negative.   Skin: Negative.   Allergic/Immunologic: Negative.   Neurological: Positive for weakness.  Hematological: Negative.   Psychiatric/Behavioral: Negative.   All other systems reviewed and are negative.     Objective:   Physical Exam Constitutional: She appears well-developed and well-nourished. NAD. HENT: Normocephalic and atraumatic.  Eyes: EOMI. No discharge.  Cardiovascular: RRR. No JVD. Respiratory: No respiratory distress. She has no wheezes.  GI: She exhibits no distension. BS+ Musculoskeletal: She exhibits no edema or deformity.  Neurological:  Right gaze improving  Motor: LUE: 2/5 shoulder abduction, elbow flex/ext 2+/5, hand grip 1+/5  LLE: 3+/5 HF, 3-/5 KE, ADF/PF 1+/5  mAS left elbow flexors 1/4, wrist flexors 2/4, finger flexors 1+/4 Left knee flexors 1/4, ADF/PF 1+/4 Mild dysarthria  Follows commands.  Insight and awareness into deficits.  Skin: Skin is warm.  Psychiatric: Normal mood and behavior    Assessment & Plan:  66 y.o. female with history of HTN, right foot fractures presents for hospital follow for right basal ganglia infarct.   1.  Left spastic hemiparesis and functional deficits secondary to right basal ganglia infarct with recent right foot fracture  Cont therapies  Cont WHO/PRAFO  Cont Fluoxetine  Discussed possibility of Botox for spasticity - pt would like to avoid injections at all costs and could not tolerate medications  Needs appointment with Cardiology for loop recorder  2. Right foot fracture  Follow up with Ortho  3. Gait abnormality  Cont hemiwalker/wheelchair for safety  4. HTN  Controlled at present  Cont meds  5. Sleep disturbance  Improving  Cont trazodone per PCP  Referrals reviewed, needs appointment with Cards, Neuro, Ortho Meds reviewed All questions answered  >40 minutes spent with patient, daughter, and son-in-law with  greater than 30 minutes in counseling regarding stroke, spasticity, therapies, bracing, follow up appointments

## 2017-06-19 NOTE — Telephone Encounter (Signed)
I left message on VM of number listed on DPR (and home # too that has her daughters name attached) that Dr Posey Pronto needs her to see a cardiologist about the loop recorder discussed.  We can make the referral to a cardiologist here in [place], or if she prefers she can see one in [place].  If that is to be the case, she will need to contact her PCP to make the local referral. Please call us back and let us know what she would like to do.

## 2017-06-24 NOTE — Telephone Encounter (Signed)
We have not received a call back and there were no answers on her home or mobile phones today.  I have mailed letter to her about the cardiology referral.

## 2017-07-04 ENCOUNTER — Telehealth: Payer: Self-pay | Admitting: Physical Medicine & Rehabilitation

## 2017-07-04 NOTE — Telephone Encounter (Signed)
Patients daughter called trying to find out why AP's request to follow up with cardiology for loop recorder was not made??  I told her I see note for cardiology and will refer back to AP and Pam who wrote DC summary  Please contact her Kingsley Spittle  (Daugher) for name of group she is to get schedule with as referral was missed in October please. Daughter is currently caring for Mom in home in [place] - but is transporting Mom here since stroke. Lari's number [phone]

## 2017-07-04 NOTE — Telephone Encounter (Signed)
We may refer her locally to a Cardiologist or she can seek one in [place].  I do not know any Cardiologists in [place], but know some at [place].  If she would like I can refer her to someone here or at [facility]  Her PCP may know a Cardiologist locally as well.  Thanks.

## 2017-07-07 ENCOUNTER — Telehealth: Payer: Self-pay | Admitting: Physical Medicine & Rehabilitation

## 2017-07-07 DIAGNOSIS — K5900 Constipation, unspecified: Secondary | ICD-10-CM | POA: Diagnosis not present

## 2017-07-07 DIAGNOSIS — I639 Cerebral infarction, unspecified: Secondary | ICD-10-CM

## 2017-07-07 NOTE — Addendum Note (Signed)
Addended by: Caro Hight on: 07/07/2017 05:46 PM   Modules accepted: Orders

## 2017-07-07 NOTE — Telephone Encounter (Signed)
Referral placed to [address] [place].

## 2017-07-07 NOTE — Telephone Encounter (Signed)
Patients daughter would prefer one here that you know please.  The plan is for the mother to eventually come back to [place].  Please give referral to local cardiologist for loop recorder

## 2017-07-07 NOTE — Telephone Encounter (Signed)
We may refer her to [facility] Cardiologist/Electrophysiologist.  Thanks.

## 2017-07-31 ENCOUNTER — Encounter: Payer: Self-pay | Admitting: Physical Medicine & Rehabilitation

## 2017-07-31 ENCOUNTER — Other Ambulatory Visit: Payer: Self-pay

## 2017-07-31 ENCOUNTER — Encounter: Payer: Medicare Other | Attending: Physical Medicine & Rehabilitation | Admitting: Physical Medicine & Rehabilitation

## 2017-07-31 VITALS — BP 115/72 | HR 64

## 2017-07-31 DIAGNOSIS — G8110 Spastic hemiplegia affecting unspecified side: Secondary | ICD-10-CM | POA: Diagnosis not present

## 2017-07-31 DIAGNOSIS — I639 Cerebral infarction, unspecified: Secondary | ICD-10-CM

## 2017-07-31 DIAGNOSIS — F17210 Nicotine dependence, cigarettes, uncomplicated: Secondary | ICD-10-CM | POA: Diagnosis not present

## 2017-07-31 DIAGNOSIS — F4321 Adjustment disorder with depressed mood: Secondary | ICD-10-CM

## 2017-07-31 DIAGNOSIS — S92901D Unspecified fracture of right foot, subsequent encounter for fracture with routine healing: Secondary | ICD-10-CM | POA: Diagnosis not present

## 2017-07-31 DIAGNOSIS — I10 Essential (primary) hypertension: Secondary | ICD-10-CM

## 2017-07-31 DIAGNOSIS — R269 Unspecified abnormalities of gait and mobility: Secondary | ICD-10-CM | POA: Diagnosis not present

## 2017-07-31 DIAGNOSIS — G479 Sleep disorder, unspecified: Secondary | ICD-10-CM | POA: Diagnosis not present

## 2017-07-31 DIAGNOSIS — I6389 Other cerebral infarction: Secondary | ICD-10-CM | POA: Diagnosis present

## 2017-07-31 DIAGNOSIS — I69359 Hemiplegia and hemiparesis following cerebral infarction affecting unspecified side: Secondary | ICD-10-CM

## 2017-07-31 NOTE — Progress Notes (Signed)
Subjective:    Patient ID: Kelly Lowe, female    DOB: September 14, 1950, 66 y.o.   MRN: [id number]  HPI   66 y.o. female with history of HTN, right foot fractures presents for follow for right basal ganglia infarct.   Last clinic visit 06/19/17.  Since last visit, pt states completed [facility] therapists and started outpt therapies in January.  She states she is wearing her braces at night.  She has questions about need for loop recorder.  Her mother passed away last night and she is very emotional.  She has not heard back from Cards.  She was released from Ortho for her foot.  Denies falls.  Using single point cane, hemiwalker, and wheelchair.  BP is controlled.  Pain Inventory Average Pain 3 Pain Right Now 1 My pain is intermittent, constant and dull  In the last 24 hours, has pain interfered with the following? General activity 1 Relation with others 0 Enjoyment of life 2 What TIME of day is your pain at its worst? morning Sleep (in general) Fair  Pain is worse with: bending, inactivity and some activites Pain improves with: rest and therapy/exercise Relief from Meds: 5  Mobility walk with assistance use a walker how many minutes can you walk? 5 ability to climb steps?  no do you drive?  no use a wheelchair needs help with transfers Do you have any goals in this area?  yes  Function disabled: date disabled 05/03/17 I need assistance with the following:  dressing, bathing, toileting, meal prep, household duties and shopping Do you have any goals in this area?  yes  Neuro/Psych weakness numbness trouble walking dizziness anxiety  Prior Studies Any changes since last visit?  no  Physicians involved in your care Any changes since last visit?  no   No family history on file. Social History   Socioeconomic History  . Marital status: Legally Separated    Spouse name: None  . Number of children: None  . Years of education: None  . Highest education level: None    Social Needs  . Financial resource strain: None  . Food insecurity - worry: None  . Food insecurity - inability: None  . Transportation needs - medical: None  . Transportation needs - non-medical: None  Occupational History  . None  Tobacco Use  . Smoking status: Former Smoker    Packs/day: 0.50    Years: 25.00    Pack years: 12.50    Types: Cigarettes    Last attempt to quit: 07/24/2017    Years since quitting: 0.0  . Smokeless tobacco: Never Used  Substance and Sexual Activity  . Alcohol use: Yes    Alcohol/week: 0.6 oz    Types: 1 Glasses of wine per week    Comment: social  . Drug use: No  . Sexual activity: No  Other Topics Concern  . None  Social History Narrative  . None   Past Surgical History:  Procedure Laterality Date  . FOOT SURGERY    . TEE WITHOUT CARDIOVERSION N/A 05/08/2017   Procedure: TRANSESOPHAGEAL ECHOCARDIOGRAM (TEE);  Surgeon: Lelon Perla, MD;  Location: [facility] ENDOSCOPY;  Service: Cardiovascular;  Laterality: N/A;   Past Medical History:  Diagnosis Date  . Hypertension    BP 115/72   Pulse 64   SpO2 96%   Opioid Risk Score:   Fall Risk Score:  `1  Depression screen PHQ 2/9  Depression screen [facility] 2/9 06/19/2017 06/19/2017  Decreased Interest 0 0  Down, Depressed, Hopeless 0 0  PHQ - 2 Score 0 0  Altered sleeping 3 -  Tired, decreased energy 2 -  Change in appetite 1 -  Feeling bad or failure about yourself  1 -  Trouble concentrating 1 -  Moving slowly or fidgety/restless 1 -  Suicidal thoughts 0 -  PHQ-9 Score 9 -  Difficult doing work/chores Not difficult at all -      Review of Systems  Constitutional: Positive for chills and fever.  HENT: Negative.   Eyes: Negative.   Respiratory: Negative.   Cardiovascular: Positive for leg swelling.  Gastrointestinal: Positive for constipation.       Poor appetite  Endocrine: Negative.   Genitourinary: Negative.   Musculoskeletal: Positive for gait problem.  Skin: Negative.    Allergic/Immunologic: Negative.   Neurological: Positive for dizziness and weakness.  Hematological: Negative.   Psychiatric/Behavioral: The patient is nervous/anxious.        Mother passed away and is very emotional  All other systems reviewed and are negative.     Objective:   Physical Exam Constitutional: She appears well-developed and well-nourished. NAD. HENT: Normocephalic and atraumatic.  Eyes: EOMI. No discharge.  Cardiovascular: RRR. No JVD. Respiratory: No respiratory distress. She has no wheezes.  GI: She exhibits no distension. BS+ Musculoskeletal: She exhibits no edema or deformity.  Neurological:  Motor: LUE: 3/5 shoulder abduction, elbow flex/ext 3/5, hand grip 2+/5  LLE: 4-/5 HF, 4-/5 KE, ADF/PF 1+/5  mAS left elbow flexors 1/4, wrist flexors 1+/4, finger flexors 1+/4 Left knee flexors 1/4, ADF/PF 1+/4 Follows commands.  Insight and awareness into deficits.  Skin: Skin is warm.  Psychiatric: Normal mood and behavior    Assessment & Plan:  66 y.o. female with history of HTN, right foot fractures presents for follow for right basal ganglia infarct.   1.  Left spastic hemiparesis and functional deficits secondary to right basal ganglia infarct with recent right foot fracture  Cont therapies, now outpatient  Cont WHO/PRAFO  Cont Fluoxetine  Discussed possibility of Botox for spasticity previously- pt would like to avoid injections at all costs and could not tolerate medications  Still Needs appointment with Cardiology for loop recorder  2. Right foot fracture  Released from Ortho  3. Gait abnormality  Cont hemiwalker/wheelchair/cane for safety  4. HTN  Controlled at present  Cont meds  5. Sleep disturbance  Improving  Cont meds per PCP   >40 spent with with patient and family, with >30 minutes in counseling regarding stroke, prognosis, answering questions  Referrals reviewed, needs appointment with Cards, Neuro, Ortho Meds reviewed All questions  answered  >40 minutes spent with patient, daughter, and son-in-law with greater than 30 minutes in counseling regarding stroke, spasticity, therapies, bracing, follow up appointments

## 2017-07-31 NOTE — Patient Instructions (Signed)
Cardiology: [facility].

## 2017-08-06 DIAGNOSIS — G8194 Hemiplegia, unspecified affecting left nondominant side: Secondary | ICD-10-CM | POA: Diagnosis not present

## 2017-08-08 DIAGNOSIS — G8194 Hemiplegia, unspecified affecting left nondominant side: Secondary | ICD-10-CM | POA: Diagnosis not present

## 2017-08-12 DIAGNOSIS — G8194 Hemiplegia, unspecified affecting left nondominant side: Secondary | ICD-10-CM | POA: Diagnosis not present

## 2017-08-14 DIAGNOSIS — G8194 Hemiplegia, unspecified affecting left nondominant side: Secondary | ICD-10-CM | POA: Diagnosis not present

## 2017-08-19 DIAGNOSIS — G8194 Hemiplegia, unspecified affecting left nondominant side: Secondary | ICD-10-CM | POA: Diagnosis not present

## 2017-08-21 DIAGNOSIS — G8194 Hemiplegia, unspecified affecting left nondominant side: Secondary | ICD-10-CM | POA: Diagnosis not present

## 2017-08-26 DIAGNOSIS — G8194 Hemiplegia, unspecified affecting left nondominant side: Secondary | ICD-10-CM | POA: Diagnosis not present

## 2017-08-28 DIAGNOSIS — G8194 Hemiplegia, unspecified affecting left nondominant side: Secondary | ICD-10-CM | POA: Diagnosis not present

## 2017-09-01 ENCOUNTER — Ambulatory Visit (INDEPENDENT_AMBULATORY_CARE_PROVIDER_SITE_OTHER): Payer: Medicare Other | Admitting: Neurology

## 2017-09-01 ENCOUNTER — Encounter: Payer: Self-pay | Admitting: Neurology

## 2017-09-01 VITALS — BP 124/70 | HR 58 | Ht 67.0 in | Wt 169.8 lb

## 2017-09-01 DIAGNOSIS — I639 Cerebral infarction, unspecified: Secondary | ICD-10-CM | POA: Diagnosis not present

## 2017-09-01 DIAGNOSIS — R5383 Other fatigue: Secondary | ICD-10-CM | POA: Diagnosis not present

## 2017-09-01 DIAGNOSIS — G8110 Spastic hemiplegia affecting unspecified side: Secondary | ICD-10-CM | POA: Diagnosis not present

## 2017-09-01 DIAGNOSIS — R748 Abnormal levels of other serum enzymes: Secondary | ICD-10-CM | POA: Diagnosis not present

## 2017-09-01 MED ORDER — BACLOFEN 10 MG PO TABS: 5.0000 mg | ORAL_TABLET | Freq: Every evening | ORAL | 0 refills | Status: DC | PRN

## 2017-09-01 NOTE — Progress Notes (Signed)
[facility] [address] [place]. [place] [address] [phone]       OFFICE FOLLOW-UP NOTE  Kelly Lowe Date of Birth:  [birth year] Medical Record Number:  [id number]   HPI: Kelly Lowe is seen today for the first office follow-up visit following hospital admission for stroke in September  2018. She is accompanied by her daughter. History is obtained from them as well as review of electronic medical records. I have personally reviewed imaging films.Kelly Lowe a 67 y.o.femalelast in her normal state of health last night around 11 which went to bed.. She awoke with left-sided weakness. Due to the presence of gaze deviation and weakness, code stroke was activated and she was taken for CT perfusion/angiogram. This was negative for large vessel occlusion. LKW: 05/02/17 night prior to bed.tpa given?: no, outside of window. CT angiogram was negative for large vessel stenosis or occlusion. MRI scan of the brain showed a large 4.4 x 2.1 x 3.2 cm acute ischemic infarct involving the right basal ganglia with a tiny punctate infarct involving the left splenium of corpus callosum. Transthoracic echo showed normal ejection fraction and trans-esophageal echocardiogram showed no evidence of PFO or cardiac source of embolism. Lower extremity venous Doppler showed a small gastrocnemius vein clot. LDL cholesterol 114 mg percent and implement A1c was 5.4. Transcranial Doppler bubble study was negative for PFO Patient was started on aspirin for stroke prevention. She states she's done well since discharge and is progressing with physical therapy but yet has significant residual spastic left hemiplegia. She is able to use a cane because she is a wheelchair for long distances. Is working with therapy twice a week. She has quit smoking completely. She has lost about 12 pounds weight as well. He has not yet had a loop recorder inserted and has questions about the procedure. She wants  to walk better with improved balance. She does have a left foot brace but she is not happy that she has to use it all the time.   ROS:   14 system review of systems is positive for  chills, fatigue, trouble swallowing, constipation, insomnia, daytime sleepiness, painful urination, joint swelling, back pain, muscle cramps, walking difficulty, itching, bruising, numbness, weakness, decreased concentration, anxiety and all other systems negative PMH:  Past Medical History:  Diagnosis Date  . Hyperlipidemia   . Hypertension   . Stroke [facility])     Social History:  Social History   Socioeconomic History  . Marital status: Legally Separated    Spouse name: Not on file  . Number of children: Not on file  . Years of education: Not on file  . Highest education level: Not on file  Social Needs  . Financial resource strain: Not on file  . Food insecurity - worry: Not on file  . Food insecurity - inability: Not on file  . Transportation needs - medical: Not on file  . Transportation needs - non-medical: Not on file  Occupational History  . Not on file  Tobacco Use  . Smoking status: Former Smoker    Packs/day: 0.50    Years: 25.00    Pack years: 12.50    Types: Cigarettes    Last attempt to quit: 05/02/2017    Years since quitting: 0.3  . Smokeless tobacco: Never Used  Substance and Sexual Activity  . Alcohol use: No    Alcohol/week: 0.6 oz    Types: 1 Glasses of wine per week    Frequency: Never  Comment: social  . Drug use: No  . Sexual activity: No  Other Topics Concern  . Not on file  Social History Narrative  . Not on file    Medications:   Current Outpatient Medications on File Prior to Visit  Medication Sig Dispense Refill  . acetaminophen (TYLENOL) 325 MG tablet Take 2 tablets (650 mg total) by mouth every 6 (six) hours as needed for mild pain (or Fever >/= 101). 30 tablet   . amLODipine (NORVASC) 5 MG tablet Take 5 mg by mouth daily.  1  . aspirin 325 MG tablet  Take 1 tablet (325 mg total) by mouth daily. 30 tablet 0  . atorvastatin (LIPITOR) 40 MG tablet Take 1 tablet (40 mg total) by mouth daily at 6 PM. 30 tablet 0  . BIOTIN PO Take 1 tablet by mouth daily.    . Cholecalciferol (VITAMIN D3 PO) Take 1 tablet by mouth as needed.    . Cyanocobalamin (VITAMIN B 12 PO) Take 1 tablet by mouth as needed.     . diclofenac sodium (VOLTAREN) 1 % GEL Apply 2 g topically 4 (four) times daily. 1 Tube 1  . docusate sodium (COLACE) 100 MG capsule Take 1 capsule (100 mg total) by mouth 2 (two) times daily. 10 capsule 0  . Meclizine HCl (BONINE PO) Take by mouth as needed.    . Melatonin 3 MG TABS Take by mouth.    Marland Kitchen FLUoxetine (PROZAC) 20 MG capsule Take 1 capsule (20 mg total) by mouth daily. 30 capsule 3  . hydrochlorothiazide (HYDRODIURIL) 12.5 MG tablet Take 12.5 mg by mouth every morning.  1  . hydrOXYzine (ATARAX/VISTARIL) 25 MG tablet Take 25 mg by mouth every 6 (six) hours as needed. May take 1-2 q 6 hours prn anxiety    . LORazepam (ATIVAN) 1 MG tablet Take 1 mg by mouth every 8 (eight) hours as needed for anxiety.    . polyethylene glycol (MIRALAX / GLYCOLAX) packet Take 17 g by mouth daily as needed.    Marland Kitchen tiZANidine (ZANAFLEX) 2 MG tablet Take 1 tablet (2 mg total) by mouth every 8 (eight) hours as needed (Spasticity). 30 tablet 0  . traMADol (ULTRAM) 50 MG tablet Take 1 tablet (50 mg total) by mouth every 6 (six) hours as needed for moderate pain. 30 tablet 0   No current facility-administered medications on file prior to visit.     Allergies:  No Known Allergies  Physical Exam General: Frail elderly lady seated, in no evident distress Head: head normocephalic and atraumatic.  Neck: supple with no carotid or supraclavicular bruits Cardiovascular: regular rate and rhythm, no murmurs Musculoskeletal: no deformity Skin:  no rash/petichiae Vascular:  Normal pulses all extremities Vitals:   09/01/17 1130  BP: 124/70  Pulse: (!) 58   Neurologic  Exam Mental Status: Awake and fully alert. Oriented to place and time. Recent and remote memory intact. Attention span, concentration and fund of knowledge appropriate. Mood and affect appropriate.  Cranial Nerves: Fundoscopic exam reveals sharp disc margins. Pupils equal, briskly reactive to light. Extraocular movements full without nystagmus. Visual fields full to confrontation. Hearing intact. Facial sensation intact. Mild left lower facial asymmetry, tongue, palate moves normally and symmetrically.  Motor: Spastic left hemiplegia with 2/5 left upper extremity stent with significant weakness of grip and intrinsic hand muscles. 3/5 strength of the left lower extremity with left ankle foot drop with 1/5 strength. Increased tone on the left.. Sensory.: intact to touch ,pinprick .position and  vibratory sensation.  Coordination: Rapid alternating movements normal in right upper and lower extremities. Finger-to-nose and heel-to-shin performed inaccurately on left side Gait and Station: Arises from chair without difficulty. Stance is normal. Gait demonstrates normal stride length and balance . Able to heel, toe and tandem walk without difficulty.  Reflexes: 2+ and asymmetric and brisker on the left compared to the right Toes downgoing.   NIHSS  6 Modified Rankin  3   ASSESSMENT: 82 year lady with right basal ganglia large infarct of cryptogenic etiology in October 2018 with significant residual spastic left hemiplegia. Vascular risk factors of hypertension hyperlipidemia and smoking    PLAN: I had a long d/w patient and her daughter about her recent  Cryptogenic stroke, spastic hemiparesis,risk for recurrent stroke/TIAs, personally independently reviewed imaging studies and stroke evaluation results and answered questions.Continue aspirin 325 mg daily  for secondary stroke prevention and maintain strict control of hypertension with blood pressure goal below 130/90, diabetes with hemoglobin A1c goal  below 6.5% and lipids with LDL cholesterol goal below 70 mg/dL. I also advised the patient to eat a healthy diet with plenty of whole grains, cereals, fruits and vegetables, exercise regularly and maintain ideal body weight. I encouraged her to continue ongoing outpatient physical and occupational therapy. I also advised the patient to discuss possible Botox injections with her physiatrist to Dr. Posey Pronto I complimented her on her smoking cessation. He also discussed fall and risk prevention. Followup in the future with my nurse practitioner Janett Billow in 3 months or call earlier if necessary Greater than 50% of time during this 25 minute visit was spent on counseling,explanation of diagnosis, planning of further management, discussion with patient and family and coordination of care Antony Contras, MD  [facility] [address] [place], [place] [address]  Phone [phone] Fax [phone] Note: This document was prepared with digital dictation and possible smart phrase technology. Any transcriptional errors that result from this process are unintentional

## 2017-09-01 NOTE — Patient Instructions (Signed)
I had a long d/w patient and her daughter about her recent  Cryptogenic stroke, spastic hemiparesis,risk for recurrent stroke/TIAs, personally independently reviewed imaging studies and stroke evaluation results and answered questions.Continue aspirin 325 mg daily  for secondary stroke prevention and maintain strict control of hypertension with blood pressure goal below 130/90, diabetes with hemoglobin A1c goal below 6.5% and lipids with LDL cholesterol goal below 70 mg/dL. I also advised the patient to eat a healthy diet with plenty of whole grains, cereals, fruits and vegetables, exercise regularly and maintain ideal body weight. I encouraged her to continue ongoing outpatient physical and occupational therapy. I also advised the patient to discuss possible Botox injections with her physiatrist to Dr. Posey Pronto I complimented her on her smoking cessation. He also discussed fall and risk prevention. Followup in the future with my nurse practitioner Janett Billow in 3 months or call earlier if necessary   Stroke Prevention Some medical conditions and behaviors are associated with a higher chance of having a stroke. You can help prevent a stroke by making nutrition, lifestyle, and other changes, including managing any medical conditions you may have. What nutrition changes can be made?  Eat healthy foods. You can do this by: ? Choosing foods high in fiber, such as fresh fruits and vegetables and whole grains. ? Eating at least 5 or more servings of fruits and vegetables a day. Try to fill half of your plate at each meal with fruits and vegetables. ? Choosing lean protein foods, such as lean cuts of meat, poultry without skin, fish, tofu, beans, and nuts. ? Eating low-fat dairy products. ? Avoiding foods that are high in salt (sodium). This can help lower blood pressure. ? Avoiding foods that have saturated fat, trans fat, and cholesterol. This can help prevent high cholesterol. ? Avoiding processed and premade  foods.  Follow your health care provider's specific guidelines for losing weight, controlling high blood pressure (hypertension), lowering high cholesterol, and managing diabetes. These may include: ? Reducing your daily calorie intake. ? Limiting your daily sodium intake to 1,500 milligrams (mg). ? Using only healthy fats for cooking, such as olive oil, canola oil, or sunflower oil. ? Counting your daily carbohydrate intake. What lifestyle changes can be made?  Maintain a healthy weight. Talk to your health care provider about your ideal weight.  Get at least 30 minutes of moderate physical activity at least 5 days a week. Moderate activity includes brisk walking, biking, and swimming.  Do not use any products that contain nicotine or tobacco, such as cigarettes and e-cigarettes. If you need help quitting, ask your health care provider. It may also be helpful to avoid exposure to secondhand smoke.  Limit alcohol intake to no more than 1 drink a day for nonpregnant women and 2 drinks a day for men. One drink equals 12 oz of beer, 5 oz of wine, or 1 oz of hard liquor.  Stop any illegal drug use.  Avoid taking birth control pills. Talk to your health care provider about the risks of taking birth control pills if: ? You are over 20 years old. ? You smoke. ? You get migraines. ? You have ever had a blood clot. What other changes can be made?  Manage your cholesterol levels. ? Eating a healthy diet is important for preventing high cholesterol. If cholesterol cannot be managed through diet alone, you may also need to take medicines. ? Take any prescribed medicines to control your cholesterol as told by your health care provider.  Manage your diabetes. ? Eating a healthy diet and exercising regularly are important parts of managing your blood sugar. If your blood sugar cannot be managed through diet and exercise, you may need to take medicines. ? Take any prescribed medicines to control  your diabetes as told by your health care provider.  Control your hypertension. ? To reduce your risk of stroke, try to keep your blood pressure below 130/80. ? Eating a healthy diet and exercising regularly are an important part of controlling your blood pressure. If your blood pressure cannot be managed through diet and exercise, you may need to take medicines. ? Take any prescribed medicines to control hypertension as told by your health care provider. ? Ask your health care provider if you should monitor your blood pressure at home. ? Have your blood pressure checked every year, even if your blood pressure is normal. Blood pressure increases with age and some medical conditions.  Get evaluated for sleep disorders (sleep apnea). Talk to your health care provider about getting a sleep evaluation if you snore a lot or have excessive sleepiness.  Take over-the-counter and prescription medicines only as told by your health care provider. Aspirin or blood thinners (antiplatelets or anticoagulants) may be recommended to reduce your risk of forming blood clots that can lead to stroke.  Make sure that any other medical conditions you have, such as atrial fibrillation or atherosclerosis, are managed. What are the warning signs of a stroke? The warning signs of a stroke can be easily remembered as BEFAST.  B is for balance. Signs include: ? Dizziness. ? Loss of balance or coordination. ? Sudden trouble walking.  E is for eyes. Signs include: ? A sudden change in vision. ? Trouble seeing.  F is for face. Signs include: ? Sudden weakness or numbness of the face. ? The face or eyelid drooping to one side.  A is for arms. Signs include: ? Sudden weakness or numbness of the arm, usually on one side of the body.  S is for speech. Signs include: ? Trouble speaking (aphasia). ? Trouble understanding.  T is for time. ? These symptoms may represent a serious problem that is an emergency. Do not  wait to see if the symptoms will go away. Get medical help right away. Call your local emergency services (911 in the [place] Do not drive yourself to the hospital.  Other signs of stroke may include: ? A sudden, severe headache with no known cause. ? Nausea or vomiting. ? Seizure.  Where to find more information: For more information, visit:  American Stroke Association: www.strokeassociation.org  National Stroke Association: www.stroke.org  Summary  You can prevent a stroke by eating healthy, exercising, not smoking, limiting alcohol intake, and managing any medical conditions you may have.  Do not use any products that contain nicotine or tobacco, such as cigarettes and e-cigarettes. If you need help quitting, ask your health care provider. It may also be helpful to avoid exposure to secondhand smoke.  Remember BEFAST for warning signs of stroke. Get help right away if you or a loved one has any of these signs. This information is not intended to replace advice given to you by your health care provider. Make sure you discuss any questions you have with your health care provider. Document Released: 08/29/2004 Document Revised: 08/27/2016 Document Reviewed: 08/27/2016 Elsevier Interactive Patient Education  [employer].

## 2017-09-02 ENCOUNTER — Telehealth: Payer: Self-pay | Admitting: Neurology

## 2017-09-02 DIAGNOSIS — G8194 Hemiplegia, unspecified affecting left nondominant side: Secondary | ICD-10-CM | POA: Diagnosis not present

## 2017-09-02 NOTE — Telephone Encounter (Signed)
Change made to correct pharmacy.

## 2017-09-02 NOTE — Telephone Encounter (Signed)
Pt daughter(on DPR from 06-2017) called stating for future ref the correct pharmacy for pt medicaitons should be [facility] at [place], [place], no call back requested.  Daughter will go to pharmacy where med was sent this time.  No call back requested

## 2017-09-04 DIAGNOSIS — G8194 Hemiplegia, unspecified affecting left nondominant side: Secondary | ICD-10-CM | POA: Diagnosis not present

## 2017-09-09 DIAGNOSIS — G8194 Hemiplegia, unspecified affecting left nondominant side: Secondary | ICD-10-CM | POA: Diagnosis not present

## 2017-09-11 DIAGNOSIS — G8194 Hemiplegia, unspecified affecting left nondominant side: Secondary | ICD-10-CM | POA: Diagnosis not present

## 2017-09-12 DIAGNOSIS — G8194 Hemiplegia, unspecified affecting left nondominant side: Secondary | ICD-10-CM | POA: Diagnosis not present

## 2017-09-12 DIAGNOSIS — M6281 Muscle weakness (generalized): Secondary | ICD-10-CM | POA: Diagnosis not present

## 2017-09-12 DIAGNOSIS — I639 Cerebral infarction, unspecified: Secondary | ICD-10-CM | POA: Diagnosis not present

## 2017-09-15 DIAGNOSIS — M6281 Muscle weakness (generalized): Secondary | ICD-10-CM | POA: Diagnosis not present

## 2017-09-15 DIAGNOSIS — G8192 Hemiplegia, unspecified affecting left dominant side: Secondary | ICD-10-CM | POA: Diagnosis not present

## 2017-09-15 DIAGNOSIS — G8194 Hemiplegia, unspecified affecting left nondominant side: Secondary | ICD-10-CM | POA: Diagnosis not present

## 2017-09-15 DIAGNOSIS — R262 Difficulty in walking, not elsewhere classified: Secondary | ICD-10-CM | POA: Diagnosis not present

## 2017-09-23 DIAGNOSIS — G8192 Hemiplegia, unspecified affecting left dominant side: Secondary | ICD-10-CM | POA: Diagnosis not present

## 2017-09-23 DIAGNOSIS — M6281 Muscle weakness (generalized): Secondary | ICD-10-CM | POA: Diagnosis not present

## 2017-09-23 DIAGNOSIS — G8194 Hemiplegia, unspecified affecting left nondominant side: Secondary | ICD-10-CM | POA: Diagnosis not present

## 2017-09-23 DIAGNOSIS — I639 Cerebral infarction, unspecified: Secondary | ICD-10-CM | POA: Diagnosis not present

## 2017-09-23 DIAGNOSIS — R262 Difficulty in walking, not elsewhere classified: Secondary | ICD-10-CM | POA: Diagnosis not present

## 2017-09-24 ENCOUNTER — Encounter: Payer: Self-pay | Admitting: Internal Medicine

## 2017-09-25 DIAGNOSIS — I639 Cerebral infarction, unspecified: Secondary | ICD-10-CM | POA: Diagnosis not present

## 2017-09-25 DIAGNOSIS — G8194 Hemiplegia, unspecified affecting left nondominant side: Secondary | ICD-10-CM | POA: Diagnosis not present

## 2017-09-25 DIAGNOSIS — R262 Difficulty in walking, not elsewhere classified: Secondary | ICD-10-CM | POA: Diagnosis not present

## 2017-09-25 DIAGNOSIS — M6281 Muscle weakness (generalized): Secondary | ICD-10-CM | POA: Diagnosis not present

## 2017-09-25 DIAGNOSIS — G8192 Hemiplegia, unspecified affecting left dominant side: Secondary | ICD-10-CM | POA: Diagnosis not present

## 2017-09-30 DIAGNOSIS — I639 Cerebral infarction, unspecified: Secondary | ICD-10-CM | POA: Diagnosis not present

## 2017-09-30 DIAGNOSIS — G8194 Hemiplegia, unspecified affecting left nondominant side: Secondary | ICD-10-CM | POA: Diagnosis not present

## 2017-09-30 DIAGNOSIS — M6281 Muscle weakness (generalized): Secondary | ICD-10-CM | POA: Diagnosis not present

## 2017-09-30 DIAGNOSIS — G8192 Hemiplegia, unspecified affecting left dominant side: Secondary | ICD-10-CM | POA: Diagnosis not present

## 2017-09-30 DIAGNOSIS — R262 Difficulty in walking, not elsewhere classified: Secondary | ICD-10-CM | POA: Diagnosis not present

## 2017-10-01 ENCOUNTER — Ambulatory Visit: Payer: Medicare Other | Admitting: Interventional Cardiology

## 2017-10-01 ENCOUNTER — Ambulatory Visit (INDEPENDENT_AMBULATORY_CARE_PROVIDER_SITE_OTHER): Payer: Medicare Other | Admitting: Internal Medicine

## 2017-10-01 ENCOUNTER — Encounter: Payer: Self-pay | Admitting: Internal Medicine

## 2017-10-01 ENCOUNTER — Ambulatory Visit: Payer: Medicare Other | Admitting: Physical Medicine & Rehabilitation

## 2017-10-01 VITALS — BP 104/60 | HR 76 | Ht 67.0 in | Wt 164.6 lb

## 2017-10-01 DIAGNOSIS — I6312 Cerebral infarction due to embolism of basilar artery: Secondary | ICD-10-CM | POA: Diagnosis not present

## 2017-10-01 NOTE — Patient Instructions (Addendum)
Medication Instructions:  Your physician recommends that you continue on your current medications as directed. Please refer to the Current Medication list given to you today.  Labwork: None ordered.  Testing/Procedures:  Linq recorder implant  Please arrive at the [facility] main entrance of [place] [facility] at:  Wed March 5 @ 1200pm   Follow-Up: Your physician recommends that you schedule a follow-up appointment in:   10-14 days with the device clinic for a wound check.   Follow up with Dr Caryl Comes as needed.     Any Other Special Instructions Will Be Listed Below (If Applicable).     If you need a refill on your cardiac medications before your next appointment, please call your pharmacy.

## 2017-10-01 NOTE — Progress Notes (Signed)
ELECTROPHYSIOLOGY CONSULT NOTE  Patient ID: Kelly Lowe, MRN: [id number], DOB/AGE: 1950-12-29 67 y.o. Admit date: (Not on file) Date of Consult: 10/01/2017  Primary Physician: Carol Ada, MD Primary Cardiologist: Reola Calkins      Kelly Lowe is a 67 y.o. female who is being seen today for the evaluation of loop recorder insertion at the request of Dr Tamala Julian.    HPI Kelly Lowe is a 67 y.o. female seen today for consideration of loop recorder implantation.  10/18 she was hospitalized for an embolic stroke-presumed to a right basal ganglia, this confirmed by MRI scanning.  Following her stroke, she went to rehabilitation a TEE having been done demonstrating no clot and normal left ventricular function She had a significant left hemiparesis with gradual improvement.  She is able to walk with some help.  She has no history of arrhythmias.  She has hypertension and dyslipidemia and is a prior smoker.    Past Medical History:  Diagnosis Date  . Hyperlipidemia   . Hypertension   . Stroke [facility])       Surgical History:  Past Surgical History:  Procedure Laterality Date  . FOOT SURGERY    . TEE WITHOUT CARDIOVERSION N/A 05/08/2017   Procedure: TRANSESOPHAGEAL ECHOCARDIOGRAM (TEE);  Surgeon: Lelon Perla, MD;  Location: [facility] ENDOSCOPY;  Service: Cardiovascular;  Laterality: N/A;     Home Meds: Prior to Admission medications   Medication Sig Start Date End Date Taking? Authorizing Provider  acetaminophen (TYLENOL) 325 MG tablet Take 2 tablets (650 mg total) by mouth every 6 (six) hours as needed for mild pain (or Fever >/= 101). 05/06/17  Yes Alphonzo Grieve, MD  amLODipine (NORVASC) 5 MG tablet Take 5 mg by mouth daily. 04/18/17  Yes [provider]  aspirin 325 MG tablet Take 1 tablet (325 mg total) by mouth daily. 05/07/17  Yes Alphonzo Grieve, MD  atorvastatin (LIPITOR) 40 MG tablet Take 1 tablet (40 mg total) by mouth daily at 6 PM. 05/28/17  Yes  Angiulli, Lavon Paganini, PA-C  baclofen (LIORESAL) 10 MG tablet Take 0.5 tablets (5 mg total) by mouth at bedtime as needed and may repeat dose one time if needed for muscle spasms. 09/01/17  Yes Garvin Fila, MD  BIOTIN PO Take 1 tablet by mouth daily.   Yes [provider]  Cholecalciferol (VITAMIN D3 PO) Take 1 tablet by mouth as needed.   Yes [provider]  Cyanocobalamin (VITAMIN B 12 PO) Take 1 tablet by mouth as needed.    Yes [provider]  diclofenac sodium (VOLTAREN) 1 % GEL Apply 2 g topically 4 (four) times daily. 05/28/17  Yes Angiulli, Lavon Paganini, PA-C  docusate sodium (COLACE) 100 MG capsule Take 1 capsule (100 mg total) by mouth 2 (two) times daily. 05/28/17  Yes Angiulli, Lavon Paganini, PA-C  FLUoxetine (PROZAC) 20 MG capsule Take 1 capsule (20 mg total) by mouth daily. 05/28/17  Yes Angiulli, Lavon Paganini, PA-C  hydrochlorothiazide (HYDRODIURIL) 12.5 MG tablet Take 12.5 mg by mouth every morning. 03/14/17  Yes [provider]  hydrOXYzine (ATARAX/VISTARIL) 25 MG tablet Take 25 mg by mouth every 6 (six) hours as needed. May take 1-2 q 6 hours prn anxiety   Yes [provider]  Meclizine HCl (BONINE PO) Take by mouth as needed.   Yes [provider]  Melatonin 3 MG TABS Take by mouth.   Yes [provider]  polyethylene glycol (MIRALAX / GLYCOLAX) packet  Take 17 g by mouth daily as needed.   Yes [provider]  tiZANidine (ZANAFLEX) 2 MG tablet Take 1 tablet (2 mg total) by mouth every 8 (eight) hours as needed (Spasticity). 05/28/17  Yes Angiulli, Lavon Paganini, PA-C  traMADol (ULTRAM) 50 MG tablet Take 1 tablet (50 mg total) by mouth every 6 (six) hours as needed for moderate pain. 05/28/17  Yes Angiulli, Lavon Paganini, PA-C    Allergies: No Known Allergies  Social History   Socioeconomic History  . Marital status: Legally Separated    Spouse name: Not on file  . Number of children: Not on file  . Years of education: Not  on file  . Highest education level: Not on file  Social Needs  . Financial resource strain: Not on file  . Food insecurity - worry: Not on file  . Food insecurity - inability: Not on file  . Transportation needs - medical: Not on file  . Transportation needs - non-medical: Not on file  Occupational History  . Not on file  Tobacco Use  . Smoking status: Former Smoker    Packs/day: 0.50    Years: 25.00    Pack years: 12.50    Types: Cigarettes    Last attempt to quit: 05/02/2017    Years since quitting: 0.4  . Smokeless tobacco: Never Used  Substance and Sexual Activity  . Alcohol use: No    Alcohol/week: 0.6 oz    Types: 1 Glasses of wine per week    Frequency: Never    Comment: social  . Drug use: No  . Sexual activity: No  Other Topics Concern  . Not on file  Social History Narrative  . Not on file     Family History  Problem Relation Age of Onset  . Stroke Maternal Grandmother   . Stroke Paternal Grandfather      ROS:  Please see the history of present illness.     All other systems reviewed and negative.    Physical Exam: Blood pressure 104/60, pulse 76, height 5\' 7"  (1.702 m), weight 164 lb 9.6 oz (74.7 kg), SpO2 97 %. General: Well developed, well nourished female in no acute distress. Head: Normocephalic, atraumatic, sclera non-icteric, no xanthomas, nares are without discharge. EENT: normal  Lymph Nodes:  none Neck: Negative for carotid bruits. JVD not elevated. Back:without scoliosis kyphosis Lungs: Clear bilaterally to auscultation without wheezes, rales, or rhonchi. Breathing is unlabored. Heart: RRR with S1 S2. No murmur . No rubs, or gallops appreciated. Abdomen: Soft, non-tender, non-distended with normoactive bowel sounds. No hepatomegaly. No rebound/guarding. No obvious abdominal masses. Msk:  Strength and tone appear normal for age. Extremities: No clubbing or cyanosis. No edema.  Distal pedal pulses are 2+ and equal bilaterally. Skin: Warm and  Dry Neuro: Alert and oriented X 3. CN III-XII intact left hemiparesis  Working: Working work as of her Acupuncturist working in psych:  Responds to questions appropriately with a normal affect.      Labs: Cardiac Enzymes No results for input(s): CKTOTAL, CKMB, TROPONINI in the last 72 hours. CBC Lab Results  Component Value Date   WBC 5.4 05/27/2017   HGB 11.5 (L) 05/27/2017   HCT 34.5 (L) 05/27/2017   MCV 98.3 05/27/2017   PLT 155 05/27/2017   PROTIME: No results for input(s): LABPROT, INR in the last 72 hours. Chemistry No results for input(s): NA, K, CL, CO2, BUN, CREATININE, CALCIUM, PROT, BILITOT, ALKPHOS, ALT, AST, GLUCOSE in the last 168 hours.  Invalid input(s): LABALBU Lipids Lab Results  Component Value Date   CHOL 174 05/04/2017   HDL 29 (L) 05/04/2017   LDLCALC 114 (H) 05/04/2017   TRIG 153 (H) 05/04/2017   BNP No results found for: PROBNP Thyroid Function Tests: No results for input(s): TSH, T4TOTAL, T3FREE, THYROIDAB in the last 72 hours.  Invalid input(s): FREET3 Miscellaneous No results found for: DDIMER  Radiology/Studies:  No results found.  EKG: sinus @ 76 14/09/39   Assessment and Plan:   Cryptogenic Stroke   HTN    The patient has had a cryptogenic stroke.  We have reviewed the value of an implantable recorder identified the presence of atrial fibrillation, the implications of subclinical atrial fibrillation associated stroke risk, the lack of prospective data on the value of anticoagulation for SCAF in the context of stroke or new stroke.  Will reach out to neuro re secondary prevention data for antiplatlet therapy ; we will decrease the dose to 81 mg.      Virl Axe

## 2017-10-01 NOTE — H&P (View-Only) (Signed)
ELECTROPHYSIOLOGY CONSULT NOTE  Patient ID: Kelly Lowe, MRN: [id number], DOB/AGE: 06-Oct-1950 67 y.o. Admit date: (Not on file) Date of Consult: 10/01/2017  Primary Physician: Carol Ada, MD Primary Cardiologist: Reola Calkins      Kelly Lowe is a 67 y.o. female who is being seen today for the evaluation of loop recorder insertion at the request of Dr Tamala Julian.    HPI Kelly Lowe is a 67 y.o. female seen today for consideration of loop recorder implantation.  10/18 she was hospitalized for an embolic stroke-presumed to a right basal ganglia, this confirmed by MRI scanning.  Following her stroke, she went to rehabilitation a TEE having been done demonstrating no clot and normal left ventricular function She had a significant left hemiparesis with gradual improvement.  She is able to walk with some help.  She has no history of arrhythmias.  She has hypertension and dyslipidemia and is a prior smoker.    Past Medical History:  Diagnosis Date  . Hyperlipidemia   . Hypertension   . Stroke [facility])       Surgical History:  Past Surgical History:  Procedure Laterality Date  . FOOT SURGERY    . TEE WITHOUT CARDIOVERSION N/A 05/08/2017   Procedure: TRANSESOPHAGEAL ECHOCARDIOGRAM (TEE);  Surgeon: Lelon Perla, MD;  Location: [facility] ENDOSCOPY;  Service: Cardiovascular;  Laterality: N/A;     Home Meds: Prior to Admission medications   Medication Sig Start Date End Date Taking? Authorizing Provider  acetaminophen (TYLENOL) 325 MG tablet Take 2 tablets (650 mg total) by mouth every 6 (six) hours as needed for mild pain (or Fever >/= 101). 05/06/17  Yes Alphonzo Grieve, MD  amLODipine (NORVASC) 5 MG tablet Take 5 mg by mouth daily. 04/18/17  Yes [provider]  aspirin 325 MG tablet Take 1 tablet (325 mg total) by mouth daily. 05/07/17  Yes Alphonzo Grieve, MD  atorvastatin (LIPITOR) 40 MG tablet Take 1 tablet (40 mg total) by mouth daily at 6 PM. 05/28/17  Yes  Angiulli, Lavon Paganini, PA-C  baclofen (LIORESAL) 10 MG tablet Take 0.5 tablets (5 mg total) by mouth at bedtime as needed and may repeat dose one time if needed for muscle spasms. 09/01/17  Yes Garvin Fila, MD  BIOTIN PO Take 1 tablet by mouth daily.   Yes [provider]  Cholecalciferol (VITAMIN D3 PO) Take 1 tablet by mouth as needed.   Yes [provider]  Cyanocobalamin (VITAMIN B 12 PO) Take 1 tablet by mouth as needed.    Yes [provider]  diclofenac sodium (VOLTAREN) 1 % GEL Apply 2 g topically 4 (four) times daily. 05/28/17  Yes Angiulli, Lavon Paganini, PA-C  docusate sodium (COLACE) 100 MG capsule Take 1 capsule (100 mg total) by mouth 2 (two) times daily. 05/28/17  Yes Angiulli, Lavon Paganini, PA-C  FLUoxetine (PROZAC) 20 MG capsule Take 1 capsule (20 mg total) by mouth daily. 05/28/17  Yes Angiulli, Lavon Paganini, PA-C  hydrochlorothiazide (HYDRODIURIL) 12.5 MG tablet Take 12.5 mg by mouth every morning. 03/14/17  Yes [provider]  hydrOXYzine (ATARAX/VISTARIL) 25 MG tablet Take 25 mg by mouth every 6 (six) hours as needed. May take 1-2 q 6 hours prn anxiety   Yes [provider]  Meclizine HCl (BONINE PO) Take by mouth as needed.   Yes [provider]  Melatonin 3 MG TABS Take by mouth.   Yes [provider]  polyethylene glycol (MIRALAX / GLYCOLAX) packet  Take 17 g by mouth daily as needed.   Yes [provider]  tiZANidine (ZANAFLEX) 2 MG tablet Take 1 tablet (2 mg total) by mouth every 8 (eight) hours as needed (Spasticity). 05/28/17  Yes Angiulli, Lavon Paganini, PA-C  traMADol (ULTRAM) 50 MG tablet Take 1 tablet (50 mg total) by mouth every 6 (six) hours as needed for moderate pain. 05/28/17  Yes Angiulli, Lavon Paganini, PA-C    Allergies: No Known Allergies  Social History   Socioeconomic History  . Marital status: Legally Separated    Spouse name: Not on file  . Number of children: Not on file  . Years of education: Not  on file  . Highest education level: Not on file  Social Needs  . Financial resource strain: Not on file  . Food insecurity - worry: Not on file  . Food insecurity - inability: Not on file  . Transportation needs - medical: Not on file  . Transportation needs - non-medical: Not on file  Occupational History  . Not on file  Tobacco Use  . Smoking status: Former Smoker    Packs/day: 0.50    Years: 25.00    Pack years: 12.50    Types: Cigarettes    Last attempt to quit: 05/02/2017    Years since quitting: 0.4  . Smokeless tobacco: Never Used  Substance and Sexual Activity  . Alcohol use: No    Alcohol/week: 0.6 oz    Types: 1 Glasses of wine per week    Frequency: Never    Comment: social  . Drug use: No  . Sexual activity: No  Other Topics Concern  . Not on file  Social History Narrative  . Not on file     Family History  Problem Relation Age of Onset  . Stroke Maternal Grandmother   . Stroke Paternal Grandfather      ROS:  Please see the history of present illness.     All other systems reviewed and negative.    Physical Exam: Blood pressure 104/60, pulse 76, height 5\' 7"  (1.702 m), weight 164 lb 9.6 oz (74.7 kg), SpO2 97 %. General: Well developed, well nourished female in no acute distress. Head: Normocephalic, atraumatic, sclera non-icteric, no xanthomas, nares are without discharge. EENT: normal  Lymph Nodes:  none Neck: Negative for carotid bruits. JVD not elevated. Back:without scoliosis kyphosis Lungs: Clear bilaterally to auscultation without wheezes, rales, or rhonchi. Breathing is unlabored. Heart: RRR with S1 S2. No murmur . No rubs, or gallops appreciated. Abdomen: Soft, non-tender, non-distended with normoactive bowel sounds. No hepatomegaly. No rebound/guarding. No obvious abdominal masses. Msk:  Strength and tone appear normal for age. Extremities: No clubbing or cyanosis. No edema.  Distal pedal pulses are 2+ and equal bilaterally. Skin: Warm and  Dry Neuro: Alert and oriented X 3. CN III-XII intact left hemiparesis  Working: Working work as of her Acupuncturist working in psych:  Responds to questions appropriately with a normal affect.      Labs: Cardiac Enzymes No results for input(s): CKTOTAL, CKMB, TROPONINI in the last 72 hours. CBC Lab Results  Component Value Date   WBC 5.4 05/27/2017   HGB 11.5 (L) 05/27/2017   HCT 34.5 (L) 05/27/2017   MCV 98.3 05/27/2017   PLT 155 05/27/2017   PROTIME: No results for input(s): LABPROT, INR in the last 72 hours. Chemistry No results for input(s): NA, K, CL, CO2, BUN, CREATININE, CALCIUM, PROT, BILITOT, ALKPHOS, ALT, AST, GLUCOSE in the last 168 hours.  Invalid input(s): LABALBU Lipids Lab Results  Component Value Date   CHOL 174 05/04/2017   HDL 29 (L) 05/04/2017   LDLCALC 114 (H) 05/04/2017   TRIG 153 (H) 05/04/2017   BNP No results found for: PROBNP Thyroid Function Tests: No results for input(s): TSH, T4TOTAL, T3FREE, THYROIDAB in the last 72 hours.  Invalid input(s): FREET3 Miscellaneous No results found for: DDIMER  Radiology/Studies:  No results found.  EKG: sinus @ 76 14/09/39   Assessment and Plan:   Cryptogenic Stroke   HTN    The patient has had a cryptogenic stroke.  We have reviewed the value of an implantable recorder identified the presence of atrial fibrillation, the implications of subclinical atrial fibrillation associated stroke risk, the lack of prospective data on the value of anticoagulation for SCAF in the context of stroke or new stroke.  Will reach out to neuro re secondary prevention data for antiplatlet therapy ; we will decrease the dose to 81 mg.      Virl Axe

## 2017-10-02 ENCOUNTER — Telehealth: Payer: Self-pay | Admitting: Internal Medicine

## 2017-10-02 DIAGNOSIS — G8192 Hemiplegia, unspecified affecting left dominant side: Secondary | ICD-10-CM | POA: Diagnosis not present

## 2017-10-02 DIAGNOSIS — M6281 Muscle weakness (generalized): Secondary | ICD-10-CM | POA: Diagnosis not present

## 2017-10-02 DIAGNOSIS — R262 Difficulty in walking, not elsewhere classified: Secondary | ICD-10-CM | POA: Diagnosis not present

## 2017-10-02 DIAGNOSIS — I639 Cerebral infarction, unspecified: Secondary | ICD-10-CM | POA: Diagnosis not present

## 2017-10-02 DIAGNOSIS — G8194 Hemiplegia, unspecified affecting left nondominant side: Secondary | ICD-10-CM | POA: Diagnosis not present

## 2017-10-02 NOTE — Telephone Encounter (Signed)
Patient daughter calling would like to clarify date and time for loop recorder implant. Times listed on AVS are different from what was discussed.

## 2017-10-02 NOTE — Telephone Encounter (Signed)
Called and clarified with pt's daughter, her mother's procedure time is 12pm on Wed March 6. There is a typo on the AVS stating "Wed March 5." I also clarified procedure time with her as it is in Epic for 4pm, however the Cath Lab knows patient is expected earlier and she will be worked in between Dr Olin Pia cases as discussed between Cathlab scheduling and Dr Caryl Comes.

## 2017-10-03 ENCOUNTER — Other Ambulatory Visit: Payer: Self-pay | Admitting: Neurology

## 2017-10-06 ENCOUNTER — Other Ambulatory Visit: Payer: Self-pay

## 2017-10-06 MED ORDER — BACLOFEN 10 MG PO TABS: 10.0000 mg | ORAL_TABLET | Freq: Every day | ORAL | 1 refills | Status: DC

## 2017-10-07 DIAGNOSIS — R262 Difficulty in walking, not elsewhere classified: Secondary | ICD-10-CM | POA: Diagnosis not present

## 2017-10-07 DIAGNOSIS — G8192 Hemiplegia, unspecified affecting left dominant side: Secondary | ICD-10-CM | POA: Diagnosis not present

## 2017-10-07 DIAGNOSIS — M6281 Muscle weakness (generalized): Secondary | ICD-10-CM | POA: Diagnosis not present

## 2017-10-07 DIAGNOSIS — I639 Cerebral infarction, unspecified: Secondary | ICD-10-CM | POA: Diagnosis not present

## 2017-10-07 DIAGNOSIS — G8194 Hemiplegia, unspecified affecting left nondominant side: Secondary | ICD-10-CM | POA: Diagnosis not present

## 2017-10-08 ENCOUNTER — Encounter (HOSPITAL_COMMUNITY)
Admission: RE | Disposition: A | Discharge status: Home / Self Care | Payer: Self-pay | Source: Ambulatory Visit | Attending: Internal Medicine

## 2017-10-08 ENCOUNTER — Ambulatory Visit (HOSPITAL_COMMUNITY)
Admission: RE | Admit: 2017-10-08 | Discharge: 2017-10-08 | Disposition: A | Discharge status: Home / Self Care | Payer: Medicare Other | Source: Ambulatory Visit | Attending: Internal Medicine | Admitting: Internal Medicine

## 2017-10-08 DIAGNOSIS — E785 Hyperlipidemia, unspecified: Secondary | ICD-10-CM | POA: Diagnosis not present

## 2017-10-08 DIAGNOSIS — Z7982 Long term (current) use of aspirin: Secondary | ICD-10-CM | POA: Diagnosis not present

## 2017-10-08 DIAGNOSIS — I6389 Other cerebral infarction: Secondary | ICD-10-CM | POA: Diagnosis not present

## 2017-10-08 DIAGNOSIS — Z79899 Other long term (current) drug therapy: Secondary | ICD-10-CM | POA: Diagnosis not present

## 2017-10-08 DIAGNOSIS — Z87891 Personal history of nicotine dependence: Secondary | ICD-10-CM | POA: Diagnosis not present

## 2017-10-08 DIAGNOSIS — I10 Essential (primary) hypertension: Secondary | ICD-10-CM | POA: Insufficient documentation

## 2017-10-08 DIAGNOSIS — I639 Cerebral infarction, unspecified: Secondary | ICD-10-CM | POA: Diagnosis present

## 2017-10-08 HISTORY — PX: LOOP RECORDER INSERTION: EP1214

## 2017-10-08 SURGERY — LOOP RECORDER INSERTION

## 2017-10-08 MED ORDER — LIDOCAINE-EPINEPHRINE 1 %-1:100000 IJ SOLN
INTRAMUSCULAR | Status: DC | PRN
  Administered 2017-10-08: 30 mL

## 2017-10-08 MED ORDER — LIDOCAINE-EPINEPHRINE 1 %-1:100000 IJ SOLN
INTRAMUSCULAR | Status: AC
  Filled 2017-10-08: qty 1

## 2017-10-08 SURGICAL SUPPLY — 2 items
LOOP REVEAL LINQSYS (Prosthesis & Implant Heart) ×1 IMPLANT
PACK LOOP INSERTION (CUSTOM PROCEDURE TRAY) ×2 IMPLANT

## 2017-10-08 NOTE — Discharge Instructions (Signed)
LOOP RECORDER INSTRUCTION SHEET GIVEN. °

## 2017-10-08 NOTE — Interval H&P Note (Signed)
History and Physical Interval Note:  10/08/2017 12:38 PM  Kelly Lowe  has presented today for surgery, with the diagnosis of stroke  The various methods of treatment have been discussed with the patient and family. After consideration of risks, benefits and other options for treatment, the patient has consented to  Procedure(s): LOOP RECORDER INSERTION (N/A) as a surgical intervention .  The patient's history has been reviewed, patient examined, no change in status, stable for surgery.  I have reviewed the patient's chart and labs.  Questions were answered to the patient's satisfaction.     Virl Axe

## 2017-10-09 ENCOUNTER — Telehealth: Payer: Self-pay | Admitting: Internal Medicine

## 2017-10-09 ENCOUNTER — Encounter (HOSPITAL_COMMUNITY): Payer: Self-pay | Admitting: Internal Medicine

## 2017-10-09 ENCOUNTER — Encounter: Payer: Self-pay | Admitting: *Deleted

## 2017-10-09 DIAGNOSIS — R262 Difficulty in walking, not elsewhere classified: Secondary | ICD-10-CM | POA: Diagnosis not present

## 2017-10-09 DIAGNOSIS — M6281 Muscle weakness (generalized): Secondary | ICD-10-CM | POA: Diagnosis not present

## 2017-10-09 DIAGNOSIS — G8194 Hemiplegia, unspecified affecting left nondominant side: Secondary | ICD-10-CM | POA: Diagnosis not present

## 2017-10-09 DIAGNOSIS — G8192 Hemiplegia, unspecified affecting left dominant side: Secondary | ICD-10-CM | POA: Diagnosis not present

## 2017-10-09 DIAGNOSIS — I639 Cerebral infarction, unspecified: Secondary | ICD-10-CM | POA: Diagnosis not present

## 2017-10-09 NOTE — Telephone Encounter (Signed)
Spoke with Kelly Lowe- she reports that the rehab facility needs permission in writing due to the loop recorder. See letter. Faxed to [employer].

## 2017-10-09 NOTE — Telephone Encounter (Signed)
Pt's dtr calling to get a note faxed to [facility]: Baldo Ash [phone] stating pt can get E stimulation therapy during rehab with her loop recorder

## 2017-10-14 DIAGNOSIS — M6281 Muscle weakness (generalized): Secondary | ICD-10-CM | POA: Diagnosis not present

## 2017-10-14 DIAGNOSIS — R262 Difficulty in walking, not elsewhere classified: Secondary | ICD-10-CM | POA: Diagnosis not present

## 2017-10-14 DIAGNOSIS — I639 Cerebral infarction, unspecified: Secondary | ICD-10-CM | POA: Diagnosis not present

## 2017-10-14 DIAGNOSIS — G8192 Hemiplegia, unspecified affecting left dominant side: Secondary | ICD-10-CM | POA: Diagnosis not present

## 2017-10-14 DIAGNOSIS — G8194 Hemiplegia, unspecified affecting left nondominant side: Secondary | ICD-10-CM | POA: Diagnosis not present

## 2017-10-15 DIAGNOSIS — G8194 Hemiplegia, unspecified affecting left nondominant side: Secondary | ICD-10-CM | POA: Diagnosis not present

## 2017-10-15 DIAGNOSIS — M6281 Muscle weakness (generalized): Secondary | ICD-10-CM | POA: Diagnosis not present

## 2017-10-15 DIAGNOSIS — G8192 Hemiplegia, unspecified affecting left dominant side: Secondary | ICD-10-CM | POA: Diagnosis not present

## 2017-10-15 DIAGNOSIS — I639 Cerebral infarction, unspecified: Secondary | ICD-10-CM | POA: Diagnosis not present

## 2017-10-15 DIAGNOSIS — R262 Difficulty in walking, not elsewhere classified: Secondary | ICD-10-CM | POA: Diagnosis not present

## 2017-10-16 ENCOUNTER — Telehealth: Payer: Self-pay

## 2017-10-16 NOTE — Telephone Encounter (Signed)
[facility] called today for this patient, requesting a prescription for a type of upper extremity brace called a: Left Hand Rolling Antispasticity Ball Splint.

## 2017-10-17 NOTE — Telephone Encounter (Signed)
We can order one for him.  Thanks.

## 2017-10-20 DIAGNOSIS — G8192 Hemiplegia, unspecified affecting left dominant side: Secondary | ICD-10-CM | POA: Diagnosis not present

## 2017-10-20 DIAGNOSIS — I639 Cerebral infarction, unspecified: Secondary | ICD-10-CM | POA: Diagnosis not present

## 2017-10-20 DIAGNOSIS — M6281 Muscle weakness (generalized): Secondary | ICD-10-CM | POA: Diagnosis not present

## 2017-10-20 DIAGNOSIS — R262 Difficulty in walking, not elsewhere classified: Secondary | ICD-10-CM | POA: Diagnosis not present

## 2017-10-20 DIAGNOSIS — G8194 Hemiplegia, unspecified affecting left nondominant side: Secondary | ICD-10-CM | POA: Diagnosis not present

## 2017-10-22 ENCOUNTER — Ambulatory Visit (INDEPENDENT_AMBULATORY_CARE_PROVIDER_SITE_OTHER): Payer: Self-pay | Admitting: *Deleted

## 2017-10-22 DIAGNOSIS — I6300 Cerebral infarction due to thrombosis of unspecified precerebral artery: Secondary | ICD-10-CM

## 2017-10-22 LAB — CUP PACEART INCLINIC DEVICE CHECK
Date Time Interrogation Session: 20190320145454
Implantable Pulse Generator Implant Date: 20190306

## 2017-10-22 NOTE — Progress Notes (Signed)
Wound Loop check in clinic. Steri-Strips removed, incision edges approximated, no redness or edema, wound well healed. Battery status: Good. R-waves 0..37 mV. 0 symptom episodes, 0 tachy episodes, 0 pause episodes, 0 brady episodes. 0 AF episodes. Monthly summary reports and ROV with PRN

## 2017-10-24 DIAGNOSIS — I639 Cerebral infarction, unspecified: Secondary | ICD-10-CM | POA: Diagnosis not present

## 2017-10-24 DIAGNOSIS — R262 Difficulty in walking, not elsewhere classified: Secondary | ICD-10-CM | POA: Diagnosis not present

## 2017-10-24 DIAGNOSIS — M6281 Muscle weakness (generalized): Secondary | ICD-10-CM | POA: Diagnosis not present

## 2017-10-24 DIAGNOSIS — G8194 Hemiplegia, unspecified affecting left nondominant side: Secondary | ICD-10-CM | POA: Diagnosis not present

## 2017-10-24 DIAGNOSIS — G8192 Hemiplegia, unspecified affecting left dominant side: Secondary | ICD-10-CM | POA: Diagnosis not present

## 2017-10-28 DIAGNOSIS — M6281 Muscle weakness (generalized): Secondary | ICD-10-CM | POA: Diagnosis not present

## 2017-10-28 DIAGNOSIS — I639 Cerebral infarction, unspecified: Secondary | ICD-10-CM | POA: Diagnosis not present

## 2017-10-28 DIAGNOSIS — G8192 Hemiplegia, unspecified affecting left dominant side: Secondary | ICD-10-CM | POA: Diagnosis not present

## 2017-10-28 DIAGNOSIS — G8194 Hemiplegia, unspecified affecting left nondominant side: Secondary | ICD-10-CM | POA: Diagnosis not present

## 2017-10-28 DIAGNOSIS — R262 Difficulty in walking, not elsewhere classified: Secondary | ICD-10-CM | POA: Diagnosis not present

## 2017-10-30 DIAGNOSIS — G8192 Hemiplegia, unspecified affecting left dominant side: Secondary | ICD-10-CM | POA: Diagnosis not present

## 2017-10-30 DIAGNOSIS — G8194 Hemiplegia, unspecified affecting left nondominant side: Secondary | ICD-10-CM | POA: Diagnosis not present

## 2017-10-30 DIAGNOSIS — R262 Difficulty in walking, not elsewhere classified: Secondary | ICD-10-CM | POA: Diagnosis not present

## 2017-10-30 DIAGNOSIS — M6281 Muscle weakness (generalized): Secondary | ICD-10-CM | POA: Diagnosis not present

## 2017-10-30 DIAGNOSIS — I639 Cerebral infarction, unspecified: Secondary | ICD-10-CM | POA: Diagnosis not present

## 2017-11-04 DIAGNOSIS — I639 Cerebral infarction, unspecified: Secondary | ICD-10-CM | POA: Diagnosis not present

## 2017-11-04 DIAGNOSIS — M6281 Muscle weakness (generalized): Secondary | ICD-10-CM | POA: Diagnosis not present

## 2017-11-04 DIAGNOSIS — R262 Difficulty in walking, not elsewhere classified: Secondary | ICD-10-CM | POA: Diagnosis not present

## 2017-11-04 DIAGNOSIS — G8192 Hemiplegia, unspecified affecting left dominant side: Secondary | ICD-10-CM | POA: Diagnosis not present

## 2017-11-04 DIAGNOSIS — G8194 Hemiplegia, unspecified affecting left nondominant side: Secondary | ICD-10-CM | POA: Diagnosis not present

## 2017-11-06 DIAGNOSIS — G8194 Hemiplegia, unspecified affecting left nondominant side: Secondary | ICD-10-CM | POA: Diagnosis not present

## 2017-11-06 DIAGNOSIS — G8192 Hemiplegia, unspecified affecting left dominant side: Secondary | ICD-10-CM | POA: Diagnosis not present

## 2017-11-06 DIAGNOSIS — I639 Cerebral infarction, unspecified: Secondary | ICD-10-CM | POA: Diagnosis not present

## 2017-11-06 DIAGNOSIS — M6281 Muscle weakness (generalized): Secondary | ICD-10-CM | POA: Diagnosis not present

## 2017-11-06 DIAGNOSIS — R262 Difficulty in walking, not elsewhere classified: Secondary | ICD-10-CM | POA: Diagnosis not present

## 2017-11-07 ENCOUNTER — Ambulatory Visit (INDEPENDENT_AMBULATORY_CARE_PROVIDER_SITE_OTHER): Payer: Medicare Other | Admitting: *Deleted

## 2017-11-07 DIAGNOSIS — I6300 Cerebral infarction due to thrombosis of unspecified precerebral artery: Secondary | ICD-10-CM

## 2017-11-10 NOTE — Progress Notes (Signed)
[facility] Summary Report / Loop Recorder 

## 2017-11-11 DIAGNOSIS — G8192 Hemiplegia, unspecified affecting left dominant side: Secondary | ICD-10-CM | POA: Diagnosis not present

## 2017-11-11 DIAGNOSIS — G8194 Hemiplegia, unspecified affecting left nondominant side: Secondary | ICD-10-CM | POA: Diagnosis not present

## 2017-11-11 DIAGNOSIS — M6281 Muscle weakness (generalized): Secondary | ICD-10-CM | POA: Diagnosis not present

## 2017-11-11 DIAGNOSIS — R262 Difficulty in walking, not elsewhere classified: Secondary | ICD-10-CM | POA: Diagnosis not present

## 2017-11-11 DIAGNOSIS — I639 Cerebral infarction, unspecified: Secondary | ICD-10-CM | POA: Diagnosis not present

## 2017-11-13 ENCOUNTER — Other Ambulatory Visit: Payer: Self-pay

## 2017-11-13 ENCOUNTER — Encounter: Payer: Medicare Other | Admitting: Physical Medicine & Rehabilitation

## 2017-11-13 ENCOUNTER — Encounter: Payer: Medicare Other | Attending: Physical Medicine & Rehabilitation | Admitting: Physical Medicine & Rehabilitation

## 2017-11-13 ENCOUNTER — Encounter: Payer: Self-pay | Admitting: Physical Medicine & Rehabilitation

## 2017-11-13 VITALS — BP 111/68 | HR 55 | Ht 67.0 in | Wt 159.8 lb

## 2017-11-13 DIAGNOSIS — G8110 Spastic hemiplegia affecting unspecified side: Secondary | ICD-10-CM

## 2017-11-13 DIAGNOSIS — I10 Essential (primary) hypertension: Secondary | ICD-10-CM | POA: Diagnosis not present

## 2017-11-13 DIAGNOSIS — I6389 Other cerebral infarction: Secondary | ICD-10-CM | POA: Diagnosis present

## 2017-11-13 DIAGNOSIS — I69359 Hemiplegia and hemiparesis following cerebral infarction affecting unspecified side: Secondary | ICD-10-CM

## 2017-11-13 DIAGNOSIS — F17210 Nicotine dependence, cigarettes, uncomplicated: Secondary | ICD-10-CM | POA: Diagnosis not present

## 2017-11-13 DIAGNOSIS — R269 Unspecified abnormalities of gait and mobility: Secondary | ICD-10-CM

## 2017-11-13 DIAGNOSIS — F4321 Adjustment disorder with depressed mood: Secondary | ICD-10-CM | POA: Diagnosis not present

## 2017-11-13 DIAGNOSIS — G479 Sleep disorder, unspecified: Secondary | ICD-10-CM

## 2017-11-13 DIAGNOSIS — R300 Dysuria: Secondary | ICD-10-CM | POA: Diagnosis not present

## 2017-11-13 DIAGNOSIS — R87612 Low grade squamous intraepithelial lesion on cytologic smear of cervix (LGSIL): Secondary | ICD-10-CM | POA: Diagnosis not present

## 2017-11-13 DIAGNOSIS — S92901D Unspecified fracture of right foot, subsequent encounter for fracture with routine healing: Secondary | ICD-10-CM | POA: Insufficient documentation

## 2017-11-13 DIAGNOSIS — I639 Cerebral infarction, unspecified: Secondary | ICD-10-CM

## 2017-11-13 DIAGNOSIS — E785 Hyperlipidemia, unspecified: Secondary | ICD-10-CM | POA: Diagnosis not present

## 2017-11-13 DIAGNOSIS — G8194 Hemiplegia, unspecified affecting left nondominant side: Secondary | ICD-10-CM | POA: Diagnosis not present

## 2017-11-13 MED ORDER — FLUOXETINE HCL 10 MG PO CAPS: 10.0000 mg | ORAL_CAPSULE | Freq: Every day | ORAL | 0 refills | Status: DC

## 2017-11-13 NOTE — Progress Notes (Addendum)
Subjective:    Patient ID: Kelly Lowe, female    DOB: Sep 10, 1950, 67 y.o.   MRN: [id number]  HPI 67 y.o. female with history of HTN, right foot fractures presents for follow for right basal ganglia infarct.   Last clinic visit 07/31/17.  Daughter present, who provides history. She saw Cards since last visit and had a loop recorder inserted, notes reviewed. Since last visit, pt states she is still in therapies for a few more weeks. She has a new brace due to increase in spasticity. She is using the hemiwalker in community and cane in home.  Denies falls.  BP has improved.  She was started on Baclofen by Neurology.     Pain Inventory Average Pain 4 Pain Right Now 1 My pain is intermittent and aching  In the last 24 hours, has pain interfered with the following? General activity 1 Relation with others 0 Enjoyment of life 2 What TIME of day is your pain at its worst? morning Sleep (in general) Fair  Pain is worse with: bending, inactivity and some activites Pain improves with: rest and therapy/exercise Relief from Meds: 5  Mobility walk with assistance use a walker how many minutes can you walk? 5 ability to climb steps?  no do you drive?  no use a wheelchair needs help with transfers Do you have any goals in this area?  yes  Function disabled: date disabled 05/03/17 I need assistance with the following:  dressing, bathing, toileting, meal prep, household duties and shopping Do you have any goals in this area?  yes  Neuro/Psych weakness numbness trouble walking dizziness anxiety  Prior Studies Any changes since last visit?  no  Physicians involved in your care Any changes since last visit?  no   Family History  Problem Relation Age of Onset  . Stroke Maternal Grandmother   . Stroke Paternal Grandfather    Social History   Socioeconomic History  . Marital status: Divorced    Spouse name: Not on file  . Number of children: Not on file  . Years of  education: Not on file  . Highest education level: Not on file  Occupational History  . Not on file  Social Needs  . Financial resource strain: Not on file  . Food insecurity:    Worry: Not on file    Inability: Not on file  . Transportation needs:    Medical: Not on file    Non-medical: Not on file  Tobacco Use  . Smoking status: Former Smoker    Packs/day: 0.50    Years: 25.00    Pack years: 12.50    Types: Cigarettes    Last attempt to quit: 05/02/2017    Years since quitting: 0.5  . Smokeless tobacco: Never Used  Substance and Sexual Activity  . Alcohol use: No    Alcohol/week: 0.6 oz    Types: 1 Glasses of wine per week    Frequency: Never    Comment: social  . Drug use: No  . Sexual activity: Never  Lifestyle  . Physical activity:    Days per week: Not on file    Minutes per session: Not on file  . Stress: Not on file  Relationships  . Social connections:    Talks on phone: Not on file    Gets together: Not on file    Attends religious service: Not on file    Active member of club or organization: Not on file    Attends  meetings of clubs or organizations: Not on file    Relationship status: Not on file  Other Topics Concern  . Not on file  Social History Narrative  . Not on file   Past Surgical History:  Procedure Laterality Date  . FOOT SURGERY    . LOOP RECORDER INSERTION N/A 10/08/2017   Procedure: LOOP RECORDER INSERTION;  Surgeon: Deboraha Sprang, MD;  Location: [facility];  Service: Cardiovascular;  Laterality: N/A;  . TEE WITHOUT CARDIOVERSION N/A 05/08/2017   Procedure: TRANSESOPHAGEAL ECHOCARDIOGRAM (TEE);  Surgeon: Lelon Perla, MD;  Location: [facility];  Service: Cardiovascular;  Laterality: N/A;   Past Medical History:  Diagnosis Date  . Hyperlipidemia   . Hypertension   . Stroke ([facility])    BP 111/68   Pulse (!) 55   Ht 5\' 7"  (1.702 m) Comment: pt reported  Wt 159 lb 12.8 oz (72.5 kg) Comment: pt reported  SpO2 93%   BMI  25.03 kg/m   Opioid Risk Score:   Fall Risk Score:  `1  Depression screen PHQ 2/9  Depression screen [facility] 2/9 11/13/2017 06/19/2017 06/19/2017  Decreased Interest 0 0 0  Down, Depressed, Hopeless 2 0 0  PHQ - 2 Score 2 0 0  Altered sleeping 1 3 -  Tired, decreased energy 0 2 -  Change in appetite 0 1 -  Feeling bad or failure about yourself  1 1 -  Trouble concentrating 0 1 -  Moving slowly or fidgety/restless 1 1 -  Suicidal thoughts 0 0 -  PHQ-9 Score 5 9 -  Difficult doing work/chores Somewhat difficult Not difficult at all -      Review of Systems  Constitutional: Positive for chills and fever.  HENT: Negative.   Eyes: Negative.   Respiratory: Negative.   Cardiovascular: Positive for leg swelling.  Gastrointestinal: Positive for constipation.       Poor appetite  Endocrine: Negative.   Genitourinary: Negative.   Musculoskeletal: Positive for gait problem.  Skin: Negative.   Allergic/Immunologic: Negative.   Neurological: Positive for dizziness and weakness.  Hematological: Negative.   Psychiatric/Behavioral: The patient is nervous/anxious.        Mother passed away and is very emotional  All other systems reviewed and are negative.     Objective:   Physical Exam Constitutional: She appears well-developed and well-nourished. NAD. HENT: Normocephalic and atraumatic.  Eyes: EOMI. No discharge.  Cardiovascular: RRR. No JVD. Respiratory: No respiratory distress. Clear.  GI: She exhibits no distension. BS+ Musculoskeletal: She exhibits no edema or deformity.  Neurological:  Motor: LUE: 2+/5 shoulder abduction, elbow flex/ext 2+/5, hand grip 2+/5  LLE: 4-/5 HF, 4-/5 KE, ADF/PF 1/5  mAS left elbow flexors 2/4, wrist flexors 3/4, finger flexors 2/4 Left ADF/PF 2/4 Follows commands.  Insight and awareness into deficits.  Skin: Skin is warm.  Psychiatric: Normal mood and behavior    Assessment & Plan:  67 y.o. female with history of HTN, right foot fractures  presents for follow for right basal ganglia infarct.   1.  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 Gait abnormality  Cont hemiwalker//cane  for safety  3. Sleep disturbance  Improving  Cont meds per PCP  Cont hemiwalker  4. Mood lability  Pt and family would like to d/c Prozaac due to potential side effects.    They plan on discussing Cymbalta with Neurology  Give labile mood, recommend considering trial of Nudexta  >40 minutes spent with patient, daughter, with greater than 35 minutes in counseling regarding stroke, spasticity, therapies, meds

## 2017-11-14 DIAGNOSIS — R262 Difficulty in walking, not elsewhere classified: Secondary | ICD-10-CM | POA: Diagnosis not present

## 2017-11-14 DIAGNOSIS — M6281 Muscle weakness (generalized): Secondary | ICD-10-CM | POA: Diagnosis not present

## 2017-11-14 DIAGNOSIS — G8194 Hemiplegia, unspecified affecting left nondominant side: Secondary | ICD-10-CM | POA: Diagnosis not present

## 2017-11-14 DIAGNOSIS — G8192 Hemiplegia, unspecified affecting left dominant side: Secondary | ICD-10-CM | POA: Diagnosis not present

## 2017-11-14 DIAGNOSIS — I639 Cerebral infarction, unspecified: Secondary | ICD-10-CM | POA: Diagnosis not present

## 2017-11-18 DIAGNOSIS — G8194 Hemiplegia, unspecified affecting left nondominant side: Secondary | ICD-10-CM | POA: Diagnosis not present

## 2017-11-18 DIAGNOSIS — R262 Difficulty in walking, not elsewhere classified: Secondary | ICD-10-CM | POA: Diagnosis not present

## 2017-11-18 DIAGNOSIS — G8192 Hemiplegia, unspecified affecting left dominant side: Secondary | ICD-10-CM | POA: Diagnosis not present

## 2017-11-18 DIAGNOSIS — I639 Cerebral infarction, unspecified: Secondary | ICD-10-CM | POA: Diagnosis not present

## 2017-11-18 DIAGNOSIS — M6281 Muscle weakness (generalized): Secondary | ICD-10-CM | POA: Diagnosis not present

## 2017-11-20 DIAGNOSIS — G8194 Hemiplegia, unspecified affecting left nondominant side: Secondary | ICD-10-CM | POA: Diagnosis not present

## 2017-11-20 DIAGNOSIS — I639 Cerebral infarction, unspecified: Secondary | ICD-10-CM | POA: Diagnosis not present

## 2017-11-20 DIAGNOSIS — G8192 Hemiplegia, unspecified affecting left dominant side: Secondary | ICD-10-CM | POA: Diagnosis not present

## 2017-11-20 DIAGNOSIS — M6281 Muscle weakness (generalized): Secondary | ICD-10-CM | POA: Diagnosis not present

## 2017-11-20 DIAGNOSIS — R262 Difficulty in walking, not elsewhere classified: Secondary | ICD-10-CM | POA: Diagnosis not present

## 2017-11-24 DIAGNOSIS — R262 Difficulty in walking, not elsewhere classified: Secondary | ICD-10-CM | POA: Diagnosis not present

## 2017-11-24 DIAGNOSIS — M6281 Muscle weakness (generalized): Secondary | ICD-10-CM | POA: Diagnosis not present

## 2017-11-24 DIAGNOSIS — G8192 Hemiplegia, unspecified affecting left dominant side: Secondary | ICD-10-CM | POA: Diagnosis not present

## 2017-11-24 DIAGNOSIS — G8194 Hemiplegia, unspecified affecting left nondominant side: Secondary | ICD-10-CM | POA: Diagnosis not present

## 2017-11-27 DIAGNOSIS — I639 Cerebral infarction, unspecified: Secondary | ICD-10-CM | POA: Diagnosis not present

## 2017-11-27 DIAGNOSIS — G8194 Hemiplegia, unspecified affecting left nondominant side: Secondary | ICD-10-CM | POA: Diagnosis not present

## 2017-11-27 DIAGNOSIS — R262 Difficulty in walking, not elsewhere classified: Secondary | ICD-10-CM | POA: Diagnosis not present

## 2017-11-27 DIAGNOSIS — G8192 Hemiplegia, unspecified affecting left dominant side: Secondary | ICD-10-CM | POA: Diagnosis not present

## 2017-11-27 DIAGNOSIS — M6281 Muscle weakness (generalized): Secondary | ICD-10-CM | POA: Diagnosis not present

## 2017-11-28 ENCOUNTER — Encounter (HOSPITAL_BASED_OUTPATIENT_CLINIC_OR_DEPARTMENT_OTHER): Payer: Medicare Other | Admitting: Physical Medicine & Rehabilitation

## 2017-11-28 ENCOUNTER — Encounter: Payer: Self-pay | Admitting: Physical Medicine & Rehabilitation

## 2017-11-28 ENCOUNTER — Other Ambulatory Visit: Payer: Self-pay

## 2017-11-28 VITALS — BP 119/75 | HR 62 | Ht 67.0 in | Wt 159.0 lb

## 2017-11-28 DIAGNOSIS — R269 Unspecified abnormalities of gait and mobility: Secondary | ICD-10-CM | POA: Diagnosis not present

## 2017-11-28 DIAGNOSIS — S92901D Unspecified fracture of right foot, subsequent encounter for fracture with routine healing: Secondary | ICD-10-CM | POA: Diagnosis not present

## 2017-11-28 DIAGNOSIS — F17210 Nicotine dependence, cigarettes, uncomplicated: Secondary | ICD-10-CM | POA: Diagnosis not present

## 2017-11-28 DIAGNOSIS — G8110 Spastic hemiplegia affecting unspecified side: Secondary | ICD-10-CM

## 2017-11-28 DIAGNOSIS — I10 Essential (primary) hypertension: Secondary | ICD-10-CM | POA: Diagnosis not present

## 2017-11-28 DIAGNOSIS — G479 Sleep disorder, unspecified: Secondary | ICD-10-CM | POA: Diagnosis not present

## 2017-11-28 NOTE — Progress Notes (Signed)
Botox: Procedure Note Patient Name: Kelly Lowe DOB: 27-Jun-1951 MRN: [id number]  Date: 11/28/17   Procedure: Botulinum toxin administration Guidance: EMG Diagnosis: Left spastic hemiparesis secondary to right basal ganglia infarct Attending: Delice Lesch, MD   Informed consent: Risks, benefits & options of the procedure are explained to the patient (and/or family). The patient elects to proceed with procedure. Risks include but are not limited to weakness, respiratory distress, dry mouth, ptosis, antibody formation, worsening of some areas of function. Benefits include decreased abnormal muscle tone, improved hygiene and positioning, decreased skin breakdown and, in some cases, decreased pain. Options include conservative management with oral antispasticity agents, phenol chemodenervation of nerve or at motor nerve branches. More invasive options include intrathecal balcofen adminstration for appropriate candidates. Surgical options may include tendon lengthening or transposition or, rarely, dorsal rhizotomy.   History/Physical Examination: 67 y.o. femalewith history of HTN, right foot fractures presents for follow for right basal ganglia infarct.    MAS:  Left elbow flexors 1+/4   Left wrist flexorss 2/4   Left finger flexors 2/4     Left ankle dorsiflexors 2/4   Previous Treatments: Therapy/Range of motion Indication for guidance: Target active muscules  Procedure: Botulinum toxin was mixed with preservative free saline with a dilution of 1cc to 100 units. Targeted limb and muscles were identified. The skin was prepped with alcohol swabs and placement of needle tip in targeted muscle was confirmed using appropriate guidance. Prior to injection, positioning of needle tip outside of blood vessel was determined by pulling back on syringe plunger.  MUSCLE UNITS             Left biceps 80U             Left FCR 30U             Left FCU 30U             Left FDS 30U             Left  FDP 30U   Left Med Gastroc 50  Left Lat Gastroc 50   Total units used: 409 Complications: None  Plan: RTC 6 weeks  Ankit Anil Patel 11:55 AM

## 2017-12-02 DIAGNOSIS — G8192 Hemiplegia, unspecified affecting left dominant side: Secondary | ICD-10-CM | POA: Diagnosis not present

## 2017-12-02 DIAGNOSIS — I639 Cerebral infarction, unspecified: Secondary | ICD-10-CM | POA: Diagnosis not present

## 2017-12-02 DIAGNOSIS — G8194 Hemiplegia, unspecified affecting left nondominant side: Secondary | ICD-10-CM | POA: Diagnosis not present

## 2017-12-02 DIAGNOSIS — M6281 Muscle weakness (generalized): Secondary | ICD-10-CM | POA: Diagnosis not present

## 2017-12-02 DIAGNOSIS — R262 Difficulty in walking, not elsewhere classified: Secondary | ICD-10-CM | POA: Diagnosis not present

## 2017-12-03 ENCOUNTER — Other Ambulatory Visit: Payer: Self-pay | Admitting: Neurology

## 2017-12-03 ENCOUNTER — Telehealth: Payer: Self-pay

## 2017-12-03 NOTE — Telephone Encounter (Signed)
Kelly Lowe from [facility] called to help answer questions about pt shoulder.

## 2017-12-04 DIAGNOSIS — I639 Cerebral infarction, unspecified: Secondary | ICD-10-CM | POA: Diagnosis not present

## 2017-12-04 DIAGNOSIS — G8192 Hemiplegia, unspecified affecting left dominant side: Secondary | ICD-10-CM | POA: Diagnosis not present

## 2017-12-04 DIAGNOSIS — M6281 Muscle weakness (generalized): Secondary | ICD-10-CM | POA: Diagnosis not present

## 2017-12-04 DIAGNOSIS — R262 Difficulty in walking, not elsewhere classified: Secondary | ICD-10-CM | POA: Diagnosis not present

## 2017-12-04 DIAGNOSIS — G8194 Hemiplegia, unspecified affecting left nondominant side: Secondary | ICD-10-CM | POA: Diagnosis not present

## 2017-12-04 NOTE — Telephone Encounter (Signed)
Attempted call back, fax machine number.  Please have therapist call back.  Thanks.

## 2017-12-05 NOTE — Telephone Encounter (Signed)
I called the number listed and it was answered by the receptionist. I asked the therapist to call us back with the questions regarding the patients shoulder

## 2017-12-08 ENCOUNTER — Ambulatory Visit (INDEPENDENT_AMBULATORY_CARE_PROVIDER_SITE_OTHER): Payer: Medicare Other | Admitting: Neurology

## 2017-12-08 ENCOUNTER — Encounter: Payer: Self-pay | Admitting: Neurology

## 2017-12-08 VITALS — BP 112/68 | HR 76 | Ht 67.0 in | Wt 156.6 lb

## 2017-12-08 DIAGNOSIS — G8110 Spastic hemiplegia affecting unspecified side: Secondary | ICD-10-CM | POA: Diagnosis not present

## 2017-12-08 DIAGNOSIS — I639 Cerebral infarction, unspecified: Secondary | ICD-10-CM

## 2017-12-08 DIAGNOSIS — G5601 Carpal tunnel syndrome, right upper limb: Secondary | ICD-10-CM | POA: Diagnosis not present

## 2017-12-08 NOTE — Progress Notes (Signed)
[facility] [address] [place]. [place] [address] [phone]       OFFICE FOLLOW-UP NOTE  Ms. APPOLONIA ACKERT Date of Birth:  June 18, 1951 Medical Record Number:  [id number]   HPI: Initial visit 08/22/2017 :Ms. Hegel is seen today for the first office follow-up visit following hospital admission for stroke in September  2018. She is accompanied by her daughter. History is obtained from them as well as review of electronic medical records. I have personally reviewed imaging films.Zayley Arras Pattersonis a 67 y.o.femalelast in her normal state of health last night around 11 which went to bed.. She awoke with left-sided weakness. Due to the presence of gaze deviation and weakness, code stroke was activated and she was taken for CT perfusion/angiogram. This was negative for large vessel occlusion. LKW: 05/02/17 night prior to bed.tpa given?: no, outside of window. CT angiogram was negative for large vessel stenosis or occlusion. MRI scan of the brain showed a large 4.4 x 2.1 x 3.2 cm acute ischemic infarct involving the right basal ganglia with a tiny punctate infarct involving the left splenium of corpus callosum. Transthoracic echo showed normal ejection fraction and trans-esophageal echocardiogram showed no evidence of PFO or cardiac source of embolism. Lower extremity venous Doppler showed a small gastrocnemius vein clot. LDL cholesterol 114 mg percent and implement A1c was 5.4. Transcranial Doppler bubble study was negative for PFO Patient was started on aspirin for stroke prevention. She states she's done well since discharge and is progressing with physical therapy but yet has significant residual spastic left hemiplegia. She is able to use a cane because she is a wheelchair for long distances. Is working with therapy twice a week. She has quit smoking completely. She has lost about 12 pounds weight as well. He has not yet had a loop recorder inserted and has questions about  the procedure. She wants to walk better with improved balance. She does have a left foot brace but she is not happy that she has to use it all the time.  Update 12/08/2017 : she returns for follow-up after last by daughter.es to have spastic left hemiplegia and is now walks with a hemiwalke She is seeing Dr. Delice Lesch and getting Botox to her left arm and leg. She feels that that has been no significant improvement so far. She is complaining of increased pain and weakness in in her right wrist which may be related to  muscle overuse or carpal tunnel as she predominantly uses her right hand to hold her hemiwalker and grabs it tightly as she is scared to fallShe states her blood pressure is well controlled today it is 112/68. She is tolerating aspirin well without bleeding or bruising. She still has some residual   and numbness from her stroke which has not yet improved. She is tolerating aspirin well without bruising or bleeding. Her blood pressure is well controlled and today it is 112/68. She has had no falls or injuries.  ROS:   67 system review of systems is positive for  chills, fatigue,  Trouble swallowing, eye redness,blrurred vision, , constipation, nausea, insomnia, frequent waking, daytime sleepiness, snoring, painful urination, bladder urgency, joint and bain, aching muscles, muscle cramps, walking difficulty, dieased concentration and anxiety nervousness and all other systems negative PMH:  Past Medical History:  Diagnosis Date  . Hyperlipidemia   . Hypertension   . Stroke [facility])     Social History:  Social History   Socioeconomic History  . Marital status: Divorced  Spouse name: Not on file  . Number of children: Not on file  . Years of education: Not on file  . Highest education level: Not on file  Occupational History  . Not on file  Social Needs  . Financial resource strain: Not on file  . Food insecurity:    Worry: Not on file    Inability: Not on file  . Transportation  needs:    Medical: Not on file    Non-medical: Not on file  Tobacco Use  . Smoking status: Former Smoker    Packs/day: 0.50    Years: 25.00    Pack years: 12.50    Types: Cigarettes    Last attempt to quit: 05/02/2017    Years since quitting: 0.6  . Smokeless tobacco: Never Used  Substance and Sexual Activity  . Alcohol use: No    Alcohol/week: 0.6 oz    Types: 1 Glasses of wine per week    Frequency: Never    Comment: social  . Drug use: No  . Sexual activity: Never  Lifestyle  . Physical activity:    Days per week: Not on file    Minutes per session: Not on file  . Stress: Not on file  Relationships  . Social connections:    Talks on phone: Not on file    Gets together: Not on file    Attends religious service: Not on file    Active member of club or organization: Not on file    Attends meetings of clubs or organizations: Not on file    Relationship status: Not on file  . Intimate partner violence:    Fear of current or ex partner: Not on file    Emotionally abused: Not on file    Physically abused: Not on file    Forced sexual activity: Not on file  Other Topics Concern  . Not on file  Social History Narrative  . Not on file    Medications:   Current Outpatient Medications on File Prior to Visit  Medication Sig Dispense Refill  . acetaminophen (TYLENOL) 325 MG tablet Take 2 tablets (650 mg total) by mouth every 6 (six) hours as needed for mild pain (or Fever >/= 101). (Patient taking differently: Take 325 mg by mouth every 6 (six) hours as needed for mild pain (or Fever >/= 101). ) 30 tablet   . amLODipine (NORVASC) 5 MG tablet Take 5 mg by mouth daily.  1  . aspirin 325 MG tablet Take 1 tablet (325 mg total) by mouth daily. 30 tablet 0  . atorvastatin (LIPITOR) 40 MG tablet Take 1 tablet (40 mg total) by mouth daily at 6 PM. 30 tablet 0  . baclofen (LIORESAL) 10 MG tablet TAKE 1 TABLET(10 MG) BY MOUTH AT BEDTIME 30 tablet 0  . Biotin 1000 MCG tablet Take 1,000  mcg by mouth daily.    . diclofenac sodium (VOLTAREN) 1 % GEL Apply 2 g topically 4 (four) times daily. (Patient taking differently: Apply 2 g topically 4 (four) times daily as needed (for pain.). ) 1 Tube 1  . hydrochlorothiazide (HYDRODIURIL) 12.5 MG tablet Take 12.5 mg by mouth every morning.  1  . hydrOXYzine (ATARAX/VISTARIL) 25 MG tablet Take 25-50 mg by mouth every 6 (six) hours as needed (anxiety/sleep.).     Marland Kitchen Meclizine HCl (BONINE) 25 MG CHEW Chew 25 mg by mouth daily as needed (for long car rides).    . Melatonin 3 MG TABS Take 3 mg  by mouth at bedtime.     . polyethylene glycol (MIRALAX / GLYCOLAX) packet Take 17 g by mouth daily as needed (for constipation.).     Marland Kitchen traMADol (ULTRAM) 50 MG tablet Take by mouth every 6 (six) hours as needed.     . vitamin B-12 (CYANOCOBALAMIN) 1000 MCG tablet Take 1,000 mcg by mouth daily.    . Vitamins/Minerals TABS Take by mouth.     No current facility-administered medications on file prior to visit.     Allergies:  No Known Allergies  Physical Exam General: Frail elderly African-Americanlady seated, in no evident distress Head: head normocephalic and atraumatic.  Neck: supple with no carotid or supraclavicular bruits Cardiovascular: regular rate and rhythm, no murmurs Musculoskeletal: no deformity Skin:  no rash/petichiae Vascular:  Normal pulses all extremities Vitals:   12/08/17 1253  BP: 112/68  Pulse: 76   Neurologic Exam Mental Status: Awake and fully alert. Oriented to place and time. Recent and remote memory intact. Attention span, concentration and fund of knowledge appropriate. Mood and affect appropriate.  Cranial Nerves: Fundoscopic exam rnot done. Pupils equal, briskly reactive to light. Extraocular movements full without nystagmus. Visual fields full to confrontation. Hearing intact. Facial sensation intact. Mild left lower facial asymmetry, tongue, palate moves normally and symmetrically.  Motor: Spastic left hemiplegia  with 2/5 left upper extremity strength with significant weakness of grip and intrinsic hand muscles. 3/5 strength of the left lower extremity with left ankle foot drop with 1/5 strength. Increased tone on the left.. Sensory.: intact to touch ,pinprick .position and vibratory sensation.  Coordination: Rapid alternating movements normal in right upper and lower extremities. Finger-to-nose and heel-to-shin performed inaccurately on left side Gait and Station: Arises from chair with  difficulty. Stance is normal. Uses a hemiwalker. Has circumduction of left leg with foot drop.  Reflexes: 2+ and asymmetric and brisker on the left compared to the right Toes downgoing.   NIHSS  6 Modified Rankin  3   ASSESSMENT: 29 year lady with right basal ganglia large infarct of cryptogenic etiology in October 2018 with significant residual spastic left hemiplegia. Vascular risk factors of hypertension hyperlipidemia and smoking    PLAN: I had a long discussion the patient and her daughter regarding her stroke and spastic left hemiparesis. Continue aspirin for stroke prevention with strict control of hypertension with blood pressure goal below 130/90 and lids with LDL cholesterol goal below 70 mg percent. Continue  Baclofen and Botox injections as per Dr. Posey Pronto.Patient was counseled consult and fall and safety prevention precautions. She is to use a hemiwalker but due to new right hand and wrist pain I recommend checking EMG no conduction study to look for carl tunnel and patient was advised to wear her right wrist extension splint and avoid rapid repetitive movements. She will also benefit with prescription for a chair lift to be installed in her house to help her move from the ground to the first floor.she will return for follow-up in 6 months for Janett Billow my nurse practitioner or call earlier if necessary Greater than 50% of time during this 25 minute visit was spent on counseling,explanation of diagnosis, planning  of further management, discussion with patient and family and coordination of care Antony Contras, MD  [facility] [address] [place], [place] [address]  Phone [phone] Fax [phone] Note: This document was prepared with digital dictation and possible smart phrase technology. Any transcriptional errors that result from this process are unintentional

## 2017-12-08 NOTE — Patient Instructions (Signed)
I had a long discussion the patient and her daughter regarding her stroke and spastic left hemiparesis. Continue aspirin for stroke prevention with strict control of hypertension with blood pressure goal below 130/90 and lids with LDL cholesterol goal below 70 mg percent. Continue  Baclofen and Botox injections as per Dr. Posey Pronto.Patient was counseled consult and fall and safety prevention precautions. She is to use a hemiwalker but due to new right hand and wrist pain I recommend checking EMG no conduction study to look for carl tunnel and patient was advised to wear her right wrist extension splint and avoid rapid repetitive movements. She will also benefit with prescription for a chair lift to be installed in her house to help her move from the ground to the first floor.she will return for follow-up in 6 months for Janett Billow my nurse practitioner or call earlier if necessary  Stroke Prevention Some medical conditions and behaviors are associated with a higher chance of having a stroke. You can help prevent a stroke by making nutrition, lifestyle, and other changes, including managing any medical conditions you may have. What nutrition changes can be made?  Eat healthy foods. You can do this by: ? Choosing foods high in fiber, such as fresh fruits and vegetables and whole grains. ? Eating at least 5 or more servings of fruits and vegetables a day. Try to fill half of your plate at each meal with fruits and vegetables. ? Choosing lean protein foods, such as lean cuts of meat, poultry without skin, fish, tofu, beans, and nuts. ? Eating low-fat dairy products. ? Avoiding foods that are high in salt (sodium). This can help lower blood pressure. ? Avoiding foods that have saturated fat, trans fat, and cholesterol. This can help prevent high cholesterol. ? Avoiding processed and premade foods.  Follow your health care provider's specific guidelines for losing weight, controlling high blood pressure  (hypertension), lowering high cholesterol, and managing diabetes. These may include: ? Reducing your daily calorie intake. ? Limiting your daily sodium intake to 1,500 milligrams (mg). ? Using only healthy fats for cooking, such as olive oil, canola oil, or sunflower oil. ? Counting your daily carbohydrate intake. What lifestyle changes can be made?  Maintain a healthy weight. Talk to your health care provider about your ideal weight.  Get at least 30 minutes of moderate physical activity at least 5 days a week. Moderate activity includes brisk walking, biking, and swimming.  Do not use any products that contain nicotine or tobacco, such as cigarettes and e-cigarettes. If you need help quitting, ask your health care provider. It may also be helpful to avoid exposure to secondhand smoke.  Limit alcohol intake to no more than 1 drink a day for nonpregnant women and 2 drinks a day for men. One drink equals 12 oz of beer, 5 oz of wine, or 1 oz of hard liquor.  Stop any illegal drug use.  Avoid taking birth control pills. Talk to your health care provider about the risks of taking birth control pills if: ? You are over 6 years old. ? You smoke. ? You get migraines. ? You have ever had a blood clot. What other changes can be made?  Manage your cholesterol levels. ? Eating a healthy diet is important for preventing high cholesterol. If cholesterol cannot be managed through diet alone, you may also need to take medicines. ? Take any prescribed medicines to control your cholesterol as told by your health care provider.  Manage your diabetes. ?  Eating a healthy diet and exercising regularly are important parts of managing your blood sugar. If your blood sugar cannot be managed through diet and exercise, you may need to take medicines. ? Take any prescribed medicines to control your diabetes as told by your health care provider.  Control your hypertension. ? To reduce your risk of stroke, try  to keep your blood pressure below 130/80. ? Eating a healthy diet and exercising regularly are an important part of controlling your blood pressure. If your blood pressure cannot be managed through diet and exercise, you may need to take medicines. ? Take any prescribed medicines to control hypertension as told by your health care provider. ? Ask your health care provider if you should monitor your blood pressure at home. ? Have your blood pressure checked every year, even if your blood pressure is normal. Blood pressure increases with age and some medical conditions.  Get evaluated for sleep disorders (sleep apnea). Talk to your health care provider about getting a sleep evaluation if you snore a lot or have excessive sleepiness.  Take over-the-counter and prescription medicines only as told by your health care provider. Aspirin or blood thinners (antiplatelets or anticoagulants) may be recommended to reduce your risk of forming blood clots that can lead to stroke.  Make sure that any other medical conditions you have, such as atrial fibrillation or atherosclerosis, are managed. What are the warning signs of a stroke? The warning signs of a stroke can be easily remembered as BEFAST.  B is for balance. Signs include: ? Dizziness. ? Loss of balance or coordination. ? Sudden trouble walking.  E is for eyes. Signs include: ? A sudden change in vision. ? Trouble seeing.  F is for face. Signs include: ? Sudden weakness or numbness of the face. ? The face or eyelid drooping to one side.  A is for arms. Signs include: ? Sudden weakness or numbness of the arm, usually on one side of the body.  S is for speech. Signs include: ? Trouble speaking (aphasia). ? Trouble understanding.  T is for time. ? These symptoms may represent a serious problem that is an emergency. Do not wait to see if the symptoms will go away. Get medical help right away. Call your local emergency services (911 in the  [place] Do not drive yourself to the hospital.  Other signs of stroke may include: ? A sudden, severe headache with no known cause. ? Nausea or vomiting. ? Seizure.  Where to find more information: For more information, visit:  American Stroke Association: www.strokeassociation.org  National Stroke Association: www.stroke.org  Summary  You can prevent a stroke by eating healthy, exercising, not smoking, limiting alcohol intake, and managing any medical conditions you may have.  Do not use any products that contain nicotine or tobacco, such as cigarettes and e-cigarettes. If you need help quitting, ask your health care provider. It may also be helpful to avoid exposure to secondhand smoke.  Remember BEFAST for warning signs of stroke. Get help right away if you or a loved one has any of these signs. This information is not intended to replace advice given to you by your health care provider. Make sure you discuss any questions you have with your health care provider. Document Released: 08/29/2004 Document Revised: 08/27/2016 Document Reviewed: 08/27/2016 Elsevier Interactive Patient Education  [employer].

## 2017-12-10 ENCOUNTER — Ambulatory Visit (INDEPENDENT_AMBULATORY_CARE_PROVIDER_SITE_OTHER): Payer: Medicare Other | Admitting: *Deleted

## 2017-12-10 DIAGNOSIS — I6300 Cerebral infarction due to thrombosis of unspecified precerebral artery: Secondary | ICD-10-CM | POA: Diagnosis not present

## 2017-12-10 DIAGNOSIS — G8194 Hemiplegia, unspecified affecting left nondominant side: Secondary | ICD-10-CM | POA: Diagnosis not present

## 2017-12-10 DIAGNOSIS — R262 Difficulty in walking, not elsewhere classified: Secondary | ICD-10-CM | POA: Diagnosis not present

## 2017-12-10 DIAGNOSIS — G8192 Hemiplegia, unspecified affecting left dominant side: Secondary | ICD-10-CM | POA: Diagnosis not present

## 2017-12-10 DIAGNOSIS — M6281 Muscle weakness (generalized): Secondary | ICD-10-CM | POA: Diagnosis not present

## 2017-12-10 LAB — CUP PACEART REMOTE DEVICE CHECK
Implantable Pulse Generator Implant Date: 20190306
MDC IDC SESS DTM: 20190405183626

## 2017-12-11 DIAGNOSIS — M6281 Muscle weakness (generalized): Secondary | ICD-10-CM | POA: Diagnosis not present

## 2017-12-11 DIAGNOSIS — R262 Difficulty in walking, not elsewhere classified: Secondary | ICD-10-CM | POA: Diagnosis not present

## 2017-12-11 DIAGNOSIS — G8194 Hemiplegia, unspecified affecting left nondominant side: Secondary | ICD-10-CM | POA: Diagnosis not present

## 2017-12-11 DIAGNOSIS — I639 Cerebral infarction, unspecified: Secondary | ICD-10-CM | POA: Diagnosis not present

## 2017-12-11 DIAGNOSIS — G8192 Hemiplegia, unspecified affecting left dominant side: Secondary | ICD-10-CM | POA: Diagnosis not present

## 2017-12-11 NOTE — Progress Notes (Signed)
[facility] Summary Report / Loop Recorder 

## 2017-12-12 DIAGNOSIS — G8194 Hemiplegia, unspecified affecting left nondominant side: Secondary | ICD-10-CM | POA: Diagnosis not present

## 2017-12-12 DIAGNOSIS — M6281 Muscle weakness (generalized): Secondary | ICD-10-CM | POA: Diagnosis not present

## 2017-12-12 DIAGNOSIS — I639 Cerebral infarction, unspecified: Secondary | ICD-10-CM | POA: Diagnosis not present

## 2017-12-16 DIAGNOSIS — I639 Cerebral infarction, unspecified: Secondary | ICD-10-CM | POA: Diagnosis not present

## 2017-12-16 DIAGNOSIS — M6281 Muscle weakness (generalized): Secondary | ICD-10-CM | POA: Diagnosis not present

## 2017-12-16 DIAGNOSIS — G8194 Hemiplegia, unspecified affecting left nondominant side: Secondary | ICD-10-CM | POA: Diagnosis not present

## 2017-12-16 DIAGNOSIS — R262 Difficulty in walking, not elsewhere classified: Secondary | ICD-10-CM | POA: Diagnosis not present

## 2017-12-16 DIAGNOSIS — G8192 Hemiplegia, unspecified affecting left dominant side: Secondary | ICD-10-CM | POA: Diagnosis not present

## 2017-12-17 ENCOUNTER — Telehealth: Payer: Self-pay

## 2017-12-17 NOTE — Telephone Encounter (Signed)
Rn spoke with [facility] that they request DME order for chair lift. RN ask where should the DME order be fax to , also do they have a company preference. [name] stated mom was still in [facility]. Rn stated the order can be sent via mail to [facility]. Rn verified address of [place]. DME mail to address verified.

## 2017-12-18 DIAGNOSIS — G8192 Hemiplegia, unspecified affecting left dominant side: Secondary | ICD-10-CM | POA: Diagnosis not present

## 2017-12-18 DIAGNOSIS — G8194 Hemiplegia, unspecified affecting left nondominant side: Secondary | ICD-10-CM | POA: Diagnosis not present

## 2017-12-18 DIAGNOSIS — R262 Difficulty in walking, not elsewhere classified: Secondary | ICD-10-CM | POA: Diagnosis not present

## 2017-12-18 DIAGNOSIS — M6281 Muscle weakness (generalized): Secondary | ICD-10-CM | POA: Diagnosis not present

## 2017-12-18 DIAGNOSIS — I639 Cerebral infarction, unspecified: Secondary | ICD-10-CM | POA: Diagnosis not present

## 2017-12-18 NOTE — Telephone Encounter (Signed)
Occupational therapist contacted my direct extension. She reports that they have utilized multiple methods to work with patients shoulder. This includes E-stim, Kynesia taping and weight bearing on dynamic surfaces to approximate shoulder. They have performed lots of active motions. Patient can actively approximate shoulder but resorts to passive subluxation. OT assistant states that Dr. Posey Pronto suggests no shoulder sling, Neurtology says yes to the shoulder sling, and they recommend shoulder sling during ambulation citing difficulty to approximate and use a hemi-walker at the same time.  Initial treat ment yielded good results 1/2 to 1 finger breadth.  Patient has digressed to 2 finger breadth. OT is asking for input from Dr. Posey Pronto on next steps and any other suggestions

## 2017-12-19 NOTE — Telephone Encounter (Signed)
My recommendations were not to use a sling at all times.  I agree, she may use the sling during ambulation. Regarding other interventions, she was recently injected with Botox, which will hopefully decrease her tone and allow for improved movement in the extremity. Thanks.

## 2017-12-22 ENCOUNTER — Other Ambulatory Visit: Payer: Self-pay | Admitting: Obstetrics and Gynecology

## 2017-12-22 ENCOUNTER — Other Ambulatory Visit (HOSPITAL_COMMUNITY)
Admission: RE | Admit: 2017-12-22 | Discharge: 2017-12-22 | Disposition: A | Discharge status: Home / Self Care | Payer: Medicare Other | Source: Ambulatory Visit | Attending: Obstetrics and Gynecology | Admitting: Obstetrics and Gynecology

## 2017-12-22 DIAGNOSIS — Z01411 Encounter for gynecological examination (general) (routine) with abnormal findings: Secondary | ICD-10-CM | POA: Diagnosis not present

## 2017-12-22 DIAGNOSIS — N870 Mild cervical dysplasia: Secondary | ICD-10-CM | POA: Diagnosis not present

## 2017-12-22 DIAGNOSIS — N888 Other specified noninflammatory disorders of cervix uteri: Secondary | ICD-10-CM | POA: Diagnosis not present

## 2017-12-22 DIAGNOSIS — N72 Inflammatory disease of cervix uteri: Secondary | ICD-10-CM | POA: Diagnosis not present

## 2017-12-23 DIAGNOSIS — R262 Difficulty in walking, not elsewhere classified: Secondary | ICD-10-CM | POA: Diagnosis not present

## 2017-12-23 DIAGNOSIS — I639 Cerebral infarction, unspecified: Secondary | ICD-10-CM | POA: Diagnosis not present

## 2017-12-23 DIAGNOSIS — M6281 Muscle weakness (generalized): Secondary | ICD-10-CM | POA: Diagnosis not present

## 2017-12-23 DIAGNOSIS — G8194 Hemiplegia, unspecified affecting left nondominant side: Secondary | ICD-10-CM | POA: Diagnosis not present

## 2017-12-23 DIAGNOSIS — G8192 Hemiplegia, unspecified affecting left dominant side: Secondary | ICD-10-CM | POA: Diagnosis not present

## 2017-12-25 DIAGNOSIS — G8194 Hemiplegia, unspecified affecting left nondominant side: Secondary | ICD-10-CM | POA: Diagnosis not present

## 2017-12-25 DIAGNOSIS — M6281 Muscle weakness (generalized): Secondary | ICD-10-CM | POA: Diagnosis not present

## 2017-12-25 DIAGNOSIS — G8192 Hemiplegia, unspecified affecting left dominant side: Secondary | ICD-10-CM | POA: Diagnosis not present

## 2017-12-25 DIAGNOSIS — R262 Difficulty in walking, not elsewhere classified: Secondary | ICD-10-CM | POA: Diagnosis not present

## 2017-12-30 DIAGNOSIS — G8192 Hemiplegia, unspecified affecting left dominant side: Secondary | ICD-10-CM | POA: Diagnosis not present

## 2017-12-30 DIAGNOSIS — M6281 Muscle weakness (generalized): Secondary | ICD-10-CM | POA: Diagnosis not present

## 2017-12-30 DIAGNOSIS — I639 Cerebral infarction, unspecified: Secondary | ICD-10-CM | POA: Diagnosis not present

## 2017-12-30 DIAGNOSIS — G8194 Hemiplegia, unspecified affecting left nondominant side: Secondary | ICD-10-CM | POA: Diagnosis not present

## 2017-12-30 DIAGNOSIS — R262 Difficulty in walking, not elsewhere classified: Secondary | ICD-10-CM | POA: Diagnosis not present

## 2017-12-31 LAB — CYTOLOGY - PAP
Diagnosis: NEGATIVE
HPV: NOT DETECTED

## 2018-01-01 ENCOUNTER — Ambulatory Visit (INDEPENDENT_AMBULATORY_CARE_PROVIDER_SITE_OTHER): Payer: Medicare Other | Admitting: Diagnostic Neuroimaging

## 2018-01-01 ENCOUNTER — Encounter (INDEPENDENT_AMBULATORY_CARE_PROVIDER_SITE_OTHER): Payer: Medicare Other | Admitting: Diagnostic Neuroimaging

## 2018-01-01 DIAGNOSIS — Z0289 Encounter for other administrative examinations: Secondary | ICD-10-CM

## 2018-01-01 DIAGNOSIS — G5601 Carpal tunnel syndrome, right upper limb: Secondary | ICD-10-CM | POA: Diagnosis not present

## 2018-01-02 DIAGNOSIS — M6281 Muscle weakness (generalized): Secondary | ICD-10-CM | POA: Diagnosis not present

## 2018-01-02 DIAGNOSIS — G8194 Hemiplegia, unspecified affecting left nondominant side: Secondary | ICD-10-CM | POA: Diagnosis not present

## 2018-01-02 DIAGNOSIS — I639 Cerebral infarction, unspecified: Secondary | ICD-10-CM | POA: Diagnosis not present

## 2018-01-02 DIAGNOSIS — R262 Difficulty in walking, not elsewhere classified: Secondary | ICD-10-CM | POA: Diagnosis not present

## 2018-01-02 DIAGNOSIS — G8192 Hemiplegia, unspecified affecting left dominant side: Secondary | ICD-10-CM | POA: Diagnosis not present

## 2018-01-02 NOTE — Procedures (Signed)
   [facility]  NCS (NERVE CONDUCTION STUDY) WITH EMG (ELECTROMYOGRAPHY) REPORT   STUDY DATE: 01/01/18 PATIENT NAME: Kelly Lowe DOB: [birth year] MRN: [id number]  ORDERING CLINICIAN: Antony Contras, MD   TECHNOLOGIST: Oneita Jolly ELECTROMYOGRAPHER: Earlean Polka. Penumalli, MD  CLINICAL INFORMATION: 67 year old female with right hand numbness and weakness.  Evaluate for carpal tunnel syndrome.   FINDINGS: NERVE CONDUCTION STUDY: Right median and right ulnar motor responses are normal.  Right radial, right median, right ulnar sensory responses are normal.  Right ulnar F-wave latencies normal.   NEEDLE ELECTROMYOGRAPHY:  Right deltoid, biceps, triceps, flexor carpi radialis, first dorsal interosseous muscles are normal.    IMPRESSION:   Normal study.  No electrodiagnostic evidence of large fiber neuropathy at this time.    INTERPRETING PHYSICIAN:  Penni Bombard, MD Certified in Neurology, Neurophysiology and Neuroimaging  [facility] [address], [place], [place] [address] [phone]   [facility]    Nerve / Sites Muscle Latency Ref. Amplitude Ref. Rel Amp Segments Distance Velocity Ref. Area    ms ms mV mV %  cm m/s m/s mVms  R Median - APB     Wrist APB 3.3 ?4.4 6.5 ?4.0 100 Wrist - APB 7   19.8     Upper arm APB 7.4  5.7  87.3 Upper arm - Wrist 21 52 ?49 19.3  R Ulnar - ADM     Wrist ADM 2.6 ?3.3 7.5 ?6.0 100 Wrist - ADM 7   25.2     B.Elbow ADM 5.9  6.6  87.6 B.Elbow - Wrist 19 57 ?49 26.4     A.Elbow ADM 8.2  6.3  94.9 A.Elbow - B.Elbow 12 52 ?49 24.5         A.Elbow - Wrist             SNC    Nerve / Sites Rec. Site Peak Lat Ref.  Amp Ref. Segments Distance    ms ms V V  cm  R Radial - Anatomical snuff box (Forearm)     Forearm Wrist 2.5 ?2.9 20 ?15 Forearm - Wrist 10  R Median - Orthodromic (Dig II, Mid palm)     Dig II Wrist 3.0 ?3.4 15 ?10 Dig II - Wrist 13  R Ulnar - Orthodromic, (Dig V, Mid  palm)     Dig V Wrist 2.9 ?3.1 6 ?5 Dig V - Wrist 15            F  Wave    Nerve F Lat Ref.   ms ms  R Ulnar - ADM 26.8 ?32.0       EMG full       EMG Summary Table    Spontaneous MUAP Recruitment  Muscle IA Fib PSW Fasc Other Amp Dur. Poly Pattern  R. Deltoid Normal None None None _______ Normal Normal Normal Normal  R. Biceps brachii Normal None None None _______ Normal Normal Normal Normal  R. Triceps brachii Normal None None None _______ Normal Normal Normal Normal  R. Flexor carpi radialis Normal None None None _______ Normal Normal Normal Normal  R. First dorsal interosseous Normal None None None _______ Normal Normal Normal Normal

## 2018-01-03 ENCOUNTER — Other Ambulatory Visit: Payer: Self-pay | Admitting: Neurology

## 2018-01-05 LAB — CUP PACEART REMOTE DEVICE CHECK
Implantable Pulse Generator Implant Date: 20190306
MDC IDC SESS DTM: 20190508193727

## 2018-01-07 ENCOUNTER — Telehealth: Payer: Self-pay

## 2018-01-07 DIAGNOSIS — G8192 Hemiplegia, unspecified affecting left dominant side: Secondary | ICD-10-CM | POA: Diagnosis not present

## 2018-01-07 DIAGNOSIS — I639 Cerebral infarction, unspecified: Secondary | ICD-10-CM | POA: Diagnosis not present

## 2018-01-07 DIAGNOSIS — R262 Difficulty in walking, not elsewhere classified: Secondary | ICD-10-CM | POA: Diagnosis not present

## 2018-01-07 DIAGNOSIS — G8194 Hemiplegia, unspecified affecting left nondominant side: Secondary | ICD-10-CM | POA: Diagnosis not present

## 2018-01-07 DIAGNOSIS — M6281 Muscle weakness (generalized): Secondary | ICD-10-CM | POA: Diagnosis not present

## 2018-01-07 NOTE — Telephone Encounter (Signed)
Notes recorded by Marval Regal, RN on 01/07/2018 at 5:12 PM EDT Rn call patients daughter [name] that her moms nerve conduction test was normal without evidence of pinched nerve. Lari verbalized understanding. ------

## 2018-01-07 NOTE — Telephone Encounter (Signed)
-----   Message from Garvin Fila, MD sent at 01/06/2018 10:27 AM EDT ----- Mitchell Heir inform the patient that nerve conduction study was normal without evidence of pinched nerve

## 2018-01-07 NOTE — Telephone Encounter (Signed)
Called daughter to inform her about a possible option for her mother for relief of her shoulder called the [employer] PCS device. Daughter said they will look it up and talk about at next appt which is Friday 01/09/18

## 2018-01-07 NOTE — Telephone Encounter (Signed)
Thank you :)

## 2018-01-08 DIAGNOSIS — R262 Difficulty in walking, not elsewhere classified: Secondary | ICD-10-CM | POA: Diagnosis not present

## 2018-01-08 DIAGNOSIS — M6281 Muscle weakness (generalized): Secondary | ICD-10-CM | POA: Diagnosis not present

## 2018-01-08 DIAGNOSIS — I639 Cerebral infarction, unspecified: Secondary | ICD-10-CM | POA: Diagnosis not present

## 2018-01-08 DIAGNOSIS — G8192 Hemiplegia, unspecified affecting left dominant side: Secondary | ICD-10-CM | POA: Diagnosis not present

## 2018-01-08 DIAGNOSIS — G8194 Hemiplegia, unspecified affecting left nondominant side: Secondary | ICD-10-CM | POA: Diagnosis not present

## 2018-01-09 ENCOUNTER — Encounter: Payer: Medicare Other | Attending: Physical Medicine & Rehabilitation | Admitting: Physical Medicine & Rehabilitation

## 2018-01-09 ENCOUNTER — Encounter: Payer: Self-pay | Admitting: Physical Medicine & Rehabilitation

## 2018-01-09 VITALS — BP 116/68 | HR 61 | Ht 67.0 in | Wt 154.0 lb

## 2018-01-09 DIAGNOSIS — S92901D Unspecified fracture of right foot, subsequent encounter for fracture with routine healing: Secondary | ICD-10-CM | POA: Diagnosis not present

## 2018-01-09 DIAGNOSIS — I639 Cerebral infarction, unspecified: Secondary | ICD-10-CM | POA: Diagnosis not present

## 2018-01-09 DIAGNOSIS — I10 Essential (primary) hypertension: Secondary | ICD-10-CM | POA: Insufficient documentation

## 2018-01-09 DIAGNOSIS — F17210 Nicotine dependence, cigarettes, uncomplicated: Secondary | ICD-10-CM | POA: Insufficient documentation

## 2018-01-09 DIAGNOSIS — G8110 Spastic hemiplegia affecting unspecified side: Secondary | ICD-10-CM | POA: Diagnosis not present

## 2018-01-09 DIAGNOSIS — G479 Sleep disorder, unspecified: Secondary | ICD-10-CM | POA: Diagnosis not present

## 2018-01-09 DIAGNOSIS — I69359 Hemiplegia and hemiparesis following cerebral infarction affecting unspecified side: Secondary | ICD-10-CM | POA: Diagnosis not present

## 2018-01-09 DIAGNOSIS — R269 Unspecified abnormalities of gait and mobility: Secondary | ICD-10-CM | POA: Diagnosis not present

## 2018-01-09 DIAGNOSIS — I6389 Other cerebral infarction: Secondary | ICD-10-CM | POA: Diagnosis present

## 2018-01-09 DIAGNOSIS — F4321 Adjustment disorder with depressed mood: Secondary | ICD-10-CM | POA: Diagnosis not present

## 2018-01-09 MED ORDER — DULOXETINE HCL 30 MG PO CPEP: 30.0000 mg | ORAL_CAPSULE | Freq: Every day | ORAL | 1 refills | Status: DC

## 2018-01-09 NOTE — Progress Notes (Addendum)
Subjective:    Patient ID: Kelly Lowe, female    DOB: Sep 06, 1950, 67 y.o.   MRN: [id number]  HPI  67 y.o. female with history of HTN, right foot fractures presents for follow for right basal ganglia infarct.   Last clinic visit 11/13/17.  Pt had Botox visit at that time.  Daughter provides much of history. Since that time, pt continues with outpt therapies.  She weaned the Prozaac.  Denies falls. Therapies notes improvement improvement with Botox. Sleep has improved.   Pain Inventory Average Pain 1 Pain Right Now 1 My pain is intermittent and dull  In the last 24 hours, has pain interfered with the following? General activity 1 Relation with others 0 Enjoyment of life 2 What TIME of day is your pain at its worst? morning Sleep (in general) Fair  Pain is worse with: some activites Pain improves with: rest and therapy/exercise Relief from Meds: n/a  Mobility use a walker how many minutes can you walk? 5-10 ability to climb steps?  yes do you drive?  no use a wheelchair needs help with transfers Do you have any goals in this area?  yes  Function disabled: date disabled 05/03/17 I need assistance with the following:  dressing, bathing, toileting, meal prep, household duties and shopping Do you have any goals in this area?  yes  Neuro/Psych weakness numbness spasms dizziness anxiety  Prior Studies Any changes since last visit?  no  Physicians involved in your care Any changes since last visit?  no   Family History  Problem Relation Age of Onset  . Stroke Maternal Grandmother   . Stroke Paternal Grandfather    Social History   Socioeconomic History  . Marital status: Divorced    Spouse name: Not on file  . Number of children: Not on file  . Years of education: Not on file  . Highest education level: Not on file  Occupational History  . Not on file  Social Needs  . Financial resource strain: Not on file  . Food insecurity:    Worry: Not on file      Inability: Not on file  . Transportation needs:    Medical: Not on file    Non-medical: Not on file  Tobacco Use  . Smoking status: Former Smoker    Packs/day: 0.50    Years: 25.00    Pack years: 12.50    Types: Cigarettes    Last attempt to quit: 05/02/2017    Years since quitting: 0.6  . Smokeless tobacco: Never Used  Substance and Sexual Activity  . Alcohol use: No    Alcohol/week: 0.6 oz    Types: 1 Glasses of wine per week    Frequency: Never    Comment: social  . Drug use: No  . Sexual activity: Never  Lifestyle  . Physical activity:    Days per week: Not on file    Minutes per session: Not on file  . Stress: Not on file  Relationships  . Social connections:    Talks on phone: Not on file    Gets together: Not on file    Attends religious service: Not on file    Active member of club or organization: Not on file    Attends meetings of clubs or organizations: Not on file    Relationship status: Not on file  Other Topics Concern  . Not on file  Social History Narrative  . Not on file   Past Surgical History:  Procedure Laterality Date  . FOOT SURGERY    . LOOP RECORDER INSERTION N/A 10/08/2017   Procedure: LOOP RECORDER INSERTION;  Surgeon: Deboraha Sprang, MD;  Location: [facility];  Service: Cardiovascular;  Laterality: N/A;  . TEE WITHOUT CARDIOVERSION N/A 05/08/2017   Procedure: TRANSESOPHAGEAL ECHOCARDIOGRAM (TEE);  Surgeon: Lelon Perla, MD;  Location: [facility];  Service: Cardiovascular;  Laterality: N/A;   Past Medical History:  Diagnosis Date  . Hyperlipidemia   . Hypertension   . Stroke (HCC)    BP 116/68   Pulse 61   Ht 5\' 7"  (1.702 m)   Wt 154 lb (69.9 kg)   SpO2 96%   BMI 24.12 kg/m   Opioid Risk Score:   Fall Risk Score:  `1  Depression screen PHQ 2/9  Depression screen [facility] 2/9 01/09/2018 11/28/2017 11/13/2017 06/19/2017 06/19/2017  Decreased Interest 0 1 0 0 0  Down, Depressed, Hopeless 0 1 2 0 0  PHQ - 2 Score 0 2 2  0 0  Altered sleeping - 1 1 3  -  Tired, decreased energy - 1 0 2 -  Change in appetite - 1 0 1 -  Feeling bad or failure about yourself  - 1 1 1  -  Trouble concentrating - 1 0 1 -  Moving slowly or fidgety/restless - 1 1 1  -  Suicidal thoughts - 0 0 0 -  PHQ-9 Score - 8 5 9  -  Difficult doing work/chores - Somewhat difficult Somewhat difficult Not difficult at all -      Review of Systems  Constitutional: Positive for chills and fever.  HENT: Negative.   Eyes: Negative.   Respiratory: Negative.   Cardiovascular: Positive for leg swelling.  Gastrointestinal: Positive for constipation.       Poor appetite  Endocrine: Negative.   Genitourinary: Negative.   Musculoskeletal: Positive for gait problem.       Spasms  Skin: Positive for rash.  Allergic/Immunologic: Negative.   Neurological: Positive for dizziness and weakness.  Hematological: Negative.   Psychiatric/Behavioral: The patient is nervous/anxious.        Mother passed away and is very emotional  All other systems reviewed and are negative.     Objective:   Physical ExamConstitutional: She appears well-developed and well-nourished. NAD. HENT: Normocephalic and atraumatic.  Eyes: EOMI. No discharge.  Cardiovascular: RRR. No JVD. Respiratory: No respiratory distress. Clear.  GI: She exhibits no distension. BS+ Musculoskeletal: Shoulder subluxation 1 finger LUE.  Bony prominence distal lateral radius  Neurological:  Motor: LUE: 2+/5 shoulder abduction, elbow flex 2/5, elbow ext 1+/5, hand grip 2+/5  LLE: 4-/5 HF, 4-/5 KE, ADF/PF 2/5  mAS left elbow flexors 1+/4, wrist flexors 1+/4, finger flexors 1+/4 Left ADF/PF 1+/4 Follows commands.  Insight and awareness into deficits.  Skin: Skin is warm. Rash on right hand.   Psychiatric: Normal mood and behavior. Labile mood.    Assessment & Plan:  67 y.o. female with history of HTN, right foot fractures presents for follow for right basal ganglia infarct.   1.  Left  spastic hemiparesis and functional deficits secondary to right basal ganglia infarct with recent right foot fracture  Cont therapies  Cont WHO/PRAFO - pt would like trial without due to discomfort, monitor for association with tone  Last visit Botox injected:    Left biceps 80U  Left FCR 30U  Left FCU 30U  Left FDS 30U  Left FDP 30U  Left Med Gastroc 50   Left Lat Gastroc 50  2. Gait abnormality  Cont hemiwalker//cane for safety  3. Sleep disturbance  Improving  4. Mood lability - more crying than laughing  D/ced Prozaac per family preference  Will start Cymbalta 30mg  daily, consider increasey  Will consider trial of Nudexta  Referral to Psychology - daughter to find someone in [place], will refer at that time  5. Left shoulder subluxation with some pain  Cont Kinesio taping  Discussed SPRINT, family to think about  Tramadol per PCP (educated on signs/symptoms serotonin syndrome)  6. Bony prominence right wrist  Daughter to obtain xray in [facility]  Daughter states she had same prominence which improved with steroids  > 45 minutes spent with pt and family with >40 minutes discussing bracing, interventions, etc

## 2018-01-12 ENCOUNTER — Ambulatory Visit (INDEPENDENT_AMBULATORY_CARE_PROVIDER_SITE_OTHER): Payer: Medicare Other | Admitting: *Deleted

## 2018-01-12 DIAGNOSIS — I6300 Cerebral infarction due to thrombosis of unspecified precerebral artery: Secondary | ICD-10-CM

## 2018-01-13 DIAGNOSIS — M6281 Muscle weakness (generalized): Secondary | ICD-10-CM | POA: Diagnosis not present

## 2018-01-13 DIAGNOSIS — M654 Radial styloid tenosynovitis [de Quervain]: Secondary | ICD-10-CM | POA: Diagnosis not present

## 2018-01-13 DIAGNOSIS — I639 Cerebral infarction, unspecified: Secondary | ICD-10-CM | POA: Diagnosis not present

## 2018-01-13 DIAGNOSIS — G8194 Hemiplegia, unspecified affecting left nondominant side: Secondary | ICD-10-CM | POA: Diagnosis not present

## 2018-01-13 DIAGNOSIS — L309 Dermatitis, unspecified: Secondary | ICD-10-CM | POA: Diagnosis not present

## 2018-01-13 NOTE — Progress Notes (Signed)
[facility] Summary Report / Loop Recorder 

## 2018-01-15 DIAGNOSIS — I639 Cerebral infarction, unspecified: Secondary | ICD-10-CM | POA: Diagnosis not present

## 2018-01-15 DIAGNOSIS — G8192 Hemiplegia, unspecified affecting left dominant side: Secondary | ICD-10-CM | POA: Diagnosis not present

## 2018-01-15 DIAGNOSIS — G8194 Hemiplegia, unspecified affecting left nondominant side: Secondary | ICD-10-CM | POA: Diagnosis not present

## 2018-01-15 DIAGNOSIS — M6281 Muscle weakness (generalized): Secondary | ICD-10-CM | POA: Diagnosis not present

## 2018-01-15 DIAGNOSIS — R262 Difficulty in walking, not elsewhere classified: Secondary | ICD-10-CM | POA: Diagnosis not present

## 2018-01-24 DIAGNOSIS — H2513 Age-related nuclear cataract, bilateral: Secondary | ICD-10-CM | POA: Diagnosis not present

## 2018-01-27 ENCOUNTER — Other Ambulatory Visit: Payer: Self-pay | Admitting: Neurology

## 2018-01-28 ENCOUNTER — Other Ambulatory Visit: Payer: Self-pay

## 2018-01-28 DIAGNOSIS — G8192 Hemiplegia, unspecified affecting left dominant side: Secondary | ICD-10-CM | POA: Diagnosis not present

## 2018-01-28 DIAGNOSIS — R262 Difficulty in walking, not elsewhere classified: Secondary | ICD-10-CM | POA: Diagnosis not present

## 2018-01-28 DIAGNOSIS — G8194 Hemiplegia, unspecified affecting left nondominant side: Secondary | ICD-10-CM | POA: Diagnosis not present

## 2018-01-28 DIAGNOSIS — I639 Cerebral infarction, unspecified: Secondary | ICD-10-CM | POA: Diagnosis not present

## 2018-01-28 DIAGNOSIS — M6281 Muscle weakness (generalized): Secondary | ICD-10-CM | POA: Diagnosis not present

## 2018-01-28 MED ORDER — BACLOFEN 10 MG PO TABS: 10.0000 mg | ORAL_TABLET | Freq: Every day | ORAL | 0 refills | Status: DC

## 2018-01-30 DIAGNOSIS — I639 Cerebral infarction, unspecified: Secondary | ICD-10-CM | POA: Diagnosis not present

## 2018-01-30 DIAGNOSIS — M6281 Muscle weakness (generalized): Secondary | ICD-10-CM | POA: Diagnosis not present

## 2018-01-30 DIAGNOSIS — R262 Difficulty in walking, not elsewhere classified: Secondary | ICD-10-CM | POA: Diagnosis not present

## 2018-01-30 DIAGNOSIS — G8194 Hemiplegia, unspecified affecting left nondominant side: Secondary | ICD-10-CM | POA: Diagnosis not present

## 2018-01-30 DIAGNOSIS — G8192 Hemiplegia, unspecified affecting left dominant side: Secondary | ICD-10-CM | POA: Diagnosis not present

## 2018-02-03 DIAGNOSIS — G8194 Hemiplegia, unspecified affecting left nondominant side: Secondary | ICD-10-CM | POA: Diagnosis not present

## 2018-02-03 DIAGNOSIS — M6281 Muscle weakness (generalized): Secondary | ICD-10-CM | POA: Diagnosis not present

## 2018-02-03 DIAGNOSIS — R262 Difficulty in walking, not elsewhere classified: Secondary | ICD-10-CM | POA: Diagnosis not present

## 2018-02-03 DIAGNOSIS — G8192 Hemiplegia, unspecified affecting left dominant side: Secondary | ICD-10-CM | POA: Diagnosis not present

## 2018-02-03 DIAGNOSIS — I639 Cerebral infarction, unspecified: Secondary | ICD-10-CM | POA: Diagnosis not present

## 2018-02-06 ENCOUNTER — Telehealth: Payer: Self-pay

## 2018-02-06 DIAGNOSIS — I639 Cerebral infarction, unspecified: Secondary | ICD-10-CM | POA: Diagnosis not present

## 2018-02-06 DIAGNOSIS — M6281 Muscle weakness (generalized): Secondary | ICD-10-CM | POA: Diagnosis not present

## 2018-02-06 DIAGNOSIS — G8194 Hemiplegia, unspecified affecting left nondominant side: Secondary | ICD-10-CM | POA: Diagnosis not present

## 2018-02-06 DIAGNOSIS — R262 Difficulty in walking, not elsewhere classified: Secondary | ICD-10-CM | POA: Diagnosis not present

## 2018-02-06 DIAGNOSIS — G8192 Hemiplegia, unspecified affecting left dominant side: Secondary | ICD-10-CM | POA: Diagnosis not present

## 2018-02-06 NOTE — Telephone Encounter (Signed)
Pt daughter called stating that pt does want to go ahead with the Sprint pain device for her left shoulder. Next appt 02/12/18

## 2018-02-06 NOTE — Telephone Encounter (Signed)
We will need to obtain insurance approval.  She may come in for that visit as a follow up, but the procedure would be later.  Thanks.

## 2018-02-10 DIAGNOSIS — M6281 Muscle weakness (generalized): Secondary | ICD-10-CM | POA: Diagnosis not present

## 2018-02-10 DIAGNOSIS — R262 Difficulty in walking, not elsewhere classified: Secondary | ICD-10-CM | POA: Diagnosis not present

## 2018-02-10 DIAGNOSIS — G8192 Hemiplegia, unspecified affecting left dominant side: Secondary | ICD-10-CM | POA: Diagnosis not present

## 2018-02-10 DIAGNOSIS — G8194 Hemiplegia, unspecified affecting left nondominant side: Secondary | ICD-10-CM | POA: Diagnosis not present

## 2018-02-10 DIAGNOSIS — I639 Cerebral infarction, unspecified: Secondary | ICD-10-CM | POA: Diagnosis not present

## 2018-02-12 ENCOUNTER — Encounter: Payer: Self-pay | Admitting: Physical Medicine & Rehabilitation

## 2018-02-12 ENCOUNTER — Encounter: Payer: Medicare Other | Attending: Physical Medicine & Rehabilitation | Admitting: Physical Medicine & Rehabilitation

## 2018-02-12 VITALS — BP 126/77 | HR 64 | Ht 67.0 in | Wt 154.0 lb

## 2018-02-12 DIAGNOSIS — I69398 Other sequelae of cerebral infarction: Secondary | ICD-10-CM | POA: Diagnosis not present

## 2018-02-12 DIAGNOSIS — G479 Sleep disorder, unspecified: Secondary | ICD-10-CM | POA: Diagnosis not present

## 2018-02-12 DIAGNOSIS — S92901D Unspecified fracture of right foot, subsequent encounter for fracture with routine healing: Secondary | ICD-10-CM | POA: Insufficient documentation

## 2018-02-12 DIAGNOSIS — I10 Essential (primary) hypertension: Secondary | ICD-10-CM | POA: Diagnosis not present

## 2018-02-12 DIAGNOSIS — G8110 Spastic hemiplegia affecting unspecified side: Secondary | ICD-10-CM

## 2018-02-12 DIAGNOSIS — F17210 Nicotine dependence, cigarettes, uncomplicated: Secondary | ICD-10-CM | POA: Diagnosis not present

## 2018-02-12 DIAGNOSIS — R209 Unspecified disturbances of skin sensation: Secondary | ICD-10-CM

## 2018-02-12 DIAGNOSIS — F4321 Adjustment disorder with depressed mood: Secondary | ICD-10-CM

## 2018-02-12 DIAGNOSIS — I69319 Unspecified symptoms and signs involving cognitive functions following cerebral infarction: Secondary | ICD-10-CM

## 2018-02-12 DIAGNOSIS — I69359 Hemiplegia and hemiparesis following cerebral infarction affecting unspecified side: Secondary | ICD-10-CM

## 2018-02-12 DIAGNOSIS — R269 Unspecified abnormalities of gait and mobility: Secondary | ICD-10-CM

## 2018-02-12 DIAGNOSIS — I639 Cerebral infarction, unspecified: Secondary | ICD-10-CM

## 2018-02-12 DIAGNOSIS — I6389 Other cerebral infarction: Secondary | ICD-10-CM | POA: Diagnosis present

## 2018-02-12 NOTE — Progress Notes (Addendum)
Subjective:    Patient ID: Kelly Lowe, female    DOB: 05-22-1951, 67 y.o.   MRN: [id number]  HPI  67 y.o. female with history of HTN, right foot fractures presents for follow for right basal ganglia infarct.   Last clinic visit 01/09/18.  Daughter present, who supplements majority of history.  Since that time, pt notes reduction in tone in LUE. She trailed without the process without issue.  Per daughter, subluxation has improved. Denies falls. Sleep has improved. She tolerating Cymbalta with benefit.  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 She is going to follow up with Psychology end of this month in [place]. She did obtain xray of right wrist, reviewed, which showed bone spur. They decided to proceed with SPRINT.    Pain Inventory Average Pain 2 Pain Right Now 0 My pain is intermittent and dull  In the last 24 hours, has pain interfered with the following? General activity 2 Relation with others 0 Enjoyment of life 0 What TIME of day is your pain at its worst? morning Sleep (in general) Good  Pain is worse with: inactivity Pain improves with: heat/ice, therapy/exercise and pacing activities Relief from Meds: n/a  Mobility walk with assistance use a cane use a walker how many minutes can you walk? 5-10 ability to climb steps?  yes do you drive?  no use a wheelchair transfers alone Do you have any goals in this area?  yes  Function disabled: date disabled 05/03/17 I need assistance with the following:  dressing, bathing, meal prep and shopping Do you have any goals in this area?  yes  Neuro/Psych tingling spasms dizziness anxiety  Prior Studies Any changes since last visit?  no  Physicians involved in your care Any changes since last visit?  no   Family History  Problem Relation Age of Onset  . Stroke Maternal Grandmother   . Stroke Paternal Grandfather    Social History   Socioeconomic History  . Marital status: Divorced    Spouse name: Not on file  . Number  of children: Not on file  . Years of education: Not on file  . Highest education level: Not on file  Occupational History  . Not on file  Social Needs  . Financial resource strain: Not on file  . Food insecurity:    Worry: Not on file    Inability: Not on file  . Transportation needs:    Medical: Not on file    Non-medical: Not on file  Tobacco Use  . Smoking status: Former Smoker    Packs/day: 0.50    Years: 25.00    Pack years: 12.50    Types: Cigarettes    Last attempt to quit: 05/02/2017    Years since quitting: 0.7  . Smokeless tobacco: Never Used  Substance and Sexual Activity  . Alcohol use: No    Alcohol/week: 0.6 oz    Types: 1 Glasses of wine per week    Frequency: Never    Comment: social  . Drug use: No  . Sexual activity: Never  Lifestyle  . Physical activity:    Days per week: Not on file    Minutes per session: Not on file  . Stress: Not on file  Relationships  . Social connections:    Talks on phone: Not on file    Gets together: Not on file    Attends religious service: Not on file    Active member of club or organization: Not on file  Attends meetings of clubs or organizations: Not on file    Relationship status: Not on file  Other Topics Concern  . Not on file  Social History Narrative  . Not on file   Past Surgical History:  Procedure Laterality Date  . FOOT SURGERY    . LOOP RECORDER INSERTION N/A 10/08/2017   Procedure: LOOP RECORDER INSERTION;  Surgeon: Deboraha Sprang, MD;  Location: [facility];  Service: Cardiovascular;  Laterality: N/A;  . TEE WITHOUT CARDIOVERSION N/A 05/08/2017   Procedure: TRANSESOPHAGEAL ECHOCARDIOGRAM (TEE);  Surgeon: Lelon Perla, MD;  Location: [facility];  Service: Cardiovascular;  Laterality: N/A;   Past Medical History:  Diagnosis Date  . Hyperlipidemia   . Hypertension   . Stroke ([place])    BP 126/77 (BP Location: Right Arm, Patient Position: Sitting, Cuff Size: Normal)   Pulse 64   Ht 5'  7" (1.702 m)   Wt 154 lb (69.9 kg)   SpO2 96%   BMI 24.12 kg/m   Opioid Risk Score:   Fall Risk Score:  `1  Depression screen PHQ 2/9  Depression screen [facility] 2/9 01/09/2018 11/28/2017 11/13/2017 06/19/2017 06/19/2017  Decreased Interest 0 1 0 0 0  Down, Depressed, Hopeless 0 1 2 0 0  PHQ - 2 Score 0 2 2 0 0  Altered sleeping - 1 1 3  -  Tired, decreased energy - 1 0 2 -  Change in appetite - 1 0 1 -  Feeling bad or failure about yourself  - 1 1 1  -  Trouble concentrating - 1 0 1 -  Moving slowly or fidgety/restless - 1 1 1  -  Suicidal thoughts - 0 0 0 -  PHQ-9 Score - 8 5 9  -  Difficult doing work/chores - Somewhat difficult Somewhat difficult Not difficult at all -      Review of Systems  Constitutional: Positive for chills and fever.  HENT: Negative.   Eyes: Negative.   Respiratory: Negative.   Cardiovascular: Negative.   Gastrointestinal: Positive for constipation.       Poor appetite  Endocrine: Negative.   Genitourinary: Negative.   Musculoskeletal: Positive for arthralgias and gait problem.       Spasms  Skin: Positive for rash.  Allergic/Immunologic: Negative.   Neurological: Positive for dizziness.       Tingling   Hematological: Negative.   Psychiatric/Behavioral: The patient is nervous/anxious.        Mother passed away and is very emotional  All other systems reviewed and are negative.     Objective:   Physical ExamConstitutional: She appears well-developed and well-nourished. NAD. HENT: Normocephalic and atraumatic.  Eyes: EOMI. No discharge.  Cardiovascular: RRR. No JVD. Respiratory: No respiratory distress. Clear.  GI: She exhibits no distension. BS+ Musculoskeletal: Shoulder subluxation ~1 finger LUE.  Bony prominence distal lateral radius  Neurological:  Motor: LUE: 3-/5 shoulder abduction, elbow flex 2+/5, elbow ext 2/5, hand grip 2+-3-/5  LLE: 4-/5 HF, 4-/5 KE, ADF/PF 2/5  mAS left elbow flexors 1/4, wrist flexors 1+/4, finger flexors  2/4 Left ADF/PF 1+/4 Follows commands.  Insight and awareness into deficits.  Skin: Skin is warm. Rash on right hand.   Psychiatric: Normal mood and behavior. Labile mood.    Assessment & Plan:  67 y.o. female with history of HTN, right foot fractures presents for follow for right basal ganglia infarct.   1.  Left spastic hemiparesis and functional deficits secondary to right basal ganglia infarct with recent right foot  fracture  Cont therapies  Cont WHO/PRAFO- pt would like trial without due to discomfort, monitor for association with tone  Persistent dysesthesias   Last visit Botox injected:    Left biceps 100U  Left FCR 30U, will increase to 50  Left FCU 30U, will increase to 50  Left FDS 30U, will increase to 75  Left FDP 30U, will increase 75               Left Med Gastroc 75   Left Lat Gastroc 75  2. Gait abnormality  Cont cane for safety  3. Sleep disturbance  Improving   4. Mood lability - more crying than laughing, some PBA symptoms  D/ced Prozaac per family preference  Will increase Cymbalta to 60mg  daily  Will consider trial of Nudexta if necessary  Follow up with Psychology in [place]  5. Left shoulder subluxation with pain  Kinesio taping on hold due to reaction to tape  Plan to proceed SPRINT  Tramadol per PCP (educated on signs/symptoms serotonin syndrome)  6. Bony prominence right wrist  Xray with bony prominence  Trial Voltaren gel  >45 minutes spent with patient, with >40 minutes spent discussed aforementioned

## 2018-02-13 DIAGNOSIS — G8192 Hemiplegia, unspecified affecting left dominant side: Secondary | ICD-10-CM | POA: Diagnosis not present

## 2018-02-13 DIAGNOSIS — I639 Cerebral infarction, unspecified: Secondary | ICD-10-CM | POA: Diagnosis not present

## 2018-02-13 DIAGNOSIS — R262 Difficulty in walking, not elsewhere classified: Secondary | ICD-10-CM | POA: Diagnosis not present

## 2018-02-13 DIAGNOSIS — M6281 Muscle weakness (generalized): Secondary | ICD-10-CM | POA: Diagnosis not present

## 2018-02-13 DIAGNOSIS — G8194 Hemiplegia, unspecified affecting left nondominant side: Secondary | ICD-10-CM | POA: Diagnosis not present

## 2018-02-14 ENCOUNTER — Other Ambulatory Visit: Payer: Self-pay | Admitting: Physical Medicine & Rehabilitation

## 2018-02-16 ENCOUNTER — Ambulatory Visit (INDEPENDENT_AMBULATORY_CARE_PROVIDER_SITE_OTHER): Payer: Medicare Other | Admitting: *Deleted

## 2018-02-16 DIAGNOSIS — I6312 Cerebral infarction due to embolism of basilar artery: Secondary | ICD-10-CM | POA: Diagnosis not present

## 2018-02-17 DIAGNOSIS — M6281 Muscle weakness (generalized): Secondary | ICD-10-CM | POA: Diagnosis not present

## 2018-02-17 DIAGNOSIS — G8194 Hemiplegia, unspecified affecting left nondominant side: Secondary | ICD-10-CM | POA: Diagnosis not present

## 2018-02-17 DIAGNOSIS — G8192 Hemiplegia, unspecified affecting left dominant side: Secondary | ICD-10-CM | POA: Diagnosis not present

## 2018-02-17 DIAGNOSIS — R262 Difficulty in walking, not elsewhere classified: Secondary | ICD-10-CM | POA: Diagnosis not present

## 2018-02-17 LAB — CUP PACEART REMOTE DEVICE CHECK
Implantable Pulse Generator Implant Date: 20190306
MDC IDC SESS DTM: 20190610203909

## 2018-02-17 NOTE — Progress Notes (Signed)
[facility] Summary Report / Loop Recorder 

## 2018-02-18 DIAGNOSIS — I639 Cerebral infarction, unspecified: Secondary | ICD-10-CM | POA: Diagnosis not present

## 2018-02-18 DIAGNOSIS — M6281 Muscle weakness (generalized): Secondary | ICD-10-CM | POA: Diagnosis not present

## 2018-02-18 DIAGNOSIS — G8194 Hemiplegia, unspecified affecting left nondominant side: Secondary | ICD-10-CM | POA: Diagnosis not present

## 2018-02-24 DIAGNOSIS — R262 Difficulty in walking, not elsewhere classified: Secondary | ICD-10-CM | POA: Diagnosis not present

## 2018-02-24 DIAGNOSIS — G8192 Hemiplegia, unspecified affecting left dominant side: Secondary | ICD-10-CM | POA: Diagnosis not present

## 2018-02-24 DIAGNOSIS — G8194 Hemiplegia, unspecified affecting left nondominant side: Secondary | ICD-10-CM | POA: Diagnosis not present

## 2018-02-24 DIAGNOSIS — M6281 Muscle weakness (generalized): Secondary | ICD-10-CM | POA: Diagnosis not present

## 2018-02-24 DIAGNOSIS — I639 Cerebral infarction, unspecified: Secondary | ICD-10-CM | POA: Diagnosis not present

## 2018-02-27 DIAGNOSIS — M6281 Muscle weakness (generalized): Secondary | ICD-10-CM | POA: Diagnosis not present

## 2018-02-27 DIAGNOSIS — G8192 Hemiplegia, unspecified affecting left dominant side: Secondary | ICD-10-CM | POA: Diagnosis not present

## 2018-02-27 DIAGNOSIS — G8194 Hemiplegia, unspecified affecting left nondominant side: Secondary | ICD-10-CM | POA: Diagnosis not present

## 2018-02-27 DIAGNOSIS — I639 Cerebral infarction, unspecified: Secondary | ICD-10-CM | POA: Diagnosis not present

## 2018-02-27 DIAGNOSIS — R262 Difficulty in walking, not elsewhere classified: Secondary | ICD-10-CM | POA: Diagnosis not present

## 2018-03-02 DIAGNOSIS — I639 Cerebral infarction, unspecified: Secondary | ICD-10-CM | POA: Diagnosis not present

## 2018-03-02 DIAGNOSIS — M6281 Muscle weakness (generalized): Secondary | ICD-10-CM | POA: Diagnosis not present

## 2018-03-02 DIAGNOSIS — G8194 Hemiplegia, unspecified affecting left nondominant side: Secondary | ICD-10-CM | POA: Diagnosis not present

## 2018-03-02 DIAGNOSIS — G8192 Hemiplegia, unspecified affecting left dominant side: Secondary | ICD-10-CM | POA: Diagnosis not present

## 2018-03-02 DIAGNOSIS — R262 Difficulty in walking, not elsewhere classified: Secondary | ICD-10-CM | POA: Diagnosis not present

## 2018-03-04 DIAGNOSIS — F330 Major depressive disorder, recurrent, mild: Secondary | ICD-10-CM | POA: Diagnosis not present

## 2018-03-04 DIAGNOSIS — G8194 Hemiplegia, unspecified affecting left nondominant side: Secondary | ICD-10-CM | POA: Diagnosis not present

## 2018-03-04 DIAGNOSIS — G8192 Hemiplegia, unspecified affecting left dominant side: Secondary | ICD-10-CM | POA: Diagnosis not present

## 2018-03-04 DIAGNOSIS — I639 Cerebral infarction, unspecified: Secondary | ICD-10-CM | POA: Diagnosis not present

## 2018-03-04 DIAGNOSIS — M6281 Muscle weakness (generalized): Secondary | ICD-10-CM | POA: Diagnosis not present

## 2018-03-04 DIAGNOSIS — R262 Difficulty in walking, not elsewhere classified: Secondary | ICD-10-CM | POA: Diagnosis not present

## 2018-03-19 ENCOUNTER — Ambulatory Visit (INDEPENDENT_AMBULATORY_CARE_PROVIDER_SITE_OTHER): Payer: Medicare Other | Admitting: *Deleted

## 2018-03-19 DIAGNOSIS — I6312 Cerebral infarction due to embolism of basilar artery: Secondary | ICD-10-CM | POA: Diagnosis not present

## 2018-03-20 NOTE — Progress Notes (Signed)
[facility] Summary Report / Loop Recorder 

## 2018-03-23 DIAGNOSIS — F330 Major depressive disorder, recurrent, mild: Secondary | ICD-10-CM | POA: Diagnosis not present

## 2018-03-26 ENCOUNTER — Encounter: Payer: Medicare Other | Attending: Physical Medicine & Rehabilitation | Admitting: Physical Medicine & Rehabilitation

## 2018-03-26 ENCOUNTER — Encounter: Payer: Self-pay | Admitting: Physical Medicine & Rehabilitation

## 2018-03-26 VITALS — BP 139/77 | HR 66 | Ht 67.0 in | Wt 149.4 lb

## 2018-03-26 DIAGNOSIS — F17210 Nicotine dependence, cigarettes, uncomplicated: Secondary | ICD-10-CM | POA: Diagnosis not present

## 2018-03-26 DIAGNOSIS — G479 Sleep disorder, unspecified: Secondary | ICD-10-CM | POA: Insufficient documentation

## 2018-03-26 DIAGNOSIS — S92901D Unspecified fracture of right foot, subsequent encounter for fracture with routine healing: Secondary | ICD-10-CM | POA: Insufficient documentation

## 2018-03-26 DIAGNOSIS — I6389 Other cerebral infarction: Secondary | ICD-10-CM | POA: Diagnosis present

## 2018-03-26 DIAGNOSIS — G8114 Spastic hemiplegia affecting left nondominant side: Secondary | ICD-10-CM | POA: Diagnosis not present

## 2018-03-26 DIAGNOSIS — G8110 Spastic hemiplegia affecting unspecified side: Secondary | ICD-10-CM | POA: Diagnosis not present

## 2018-03-26 DIAGNOSIS — R269 Unspecified abnormalities of gait and mobility: Secondary | ICD-10-CM | POA: Insufficient documentation

## 2018-03-26 DIAGNOSIS — I10 Essential (primary) hypertension: Secondary | ICD-10-CM | POA: Insufficient documentation

## 2018-03-26 NOTE — Progress Notes (Signed)
Botox: Procedure Note Patient Name: Kelly Lowe DOB: 1950-10-14 MRN: [id number]  Date: 11/28/17   Procedure: Botulinum toxin administration Guidance: EMG Diagnosis: Left spastic hemiparesis secondary to right basal ganglia infarct Attending: Delice Lesch, MD   Informed consent: Risks, benefits & options of the procedure are explained to the patient (and/or family). The patient elects to proceed with procedure. Risks include but are not limited to weakness, respiratory distress, dry mouth, ptosis, antibody formation, worsening of some areas of function. Benefits include decreased abnormal muscle tone, improved hygiene and positioning, decreased skin breakdown and, in some cases, decreased pain. Options include conservative management with oral antispasticity agents, phenol chemodenervation of nerve or at motor nerve branches. More invasive options include intrathecal balcofen adminstration for appropriate candidates. Surgical options may include tendon lengthening or transposition or, rarely, dorsal rhizotomy.   History/Physical Examination: 67 y.o. femalewith history of HTN, right foot fractures presents for follow for right basal ganglia infarct.    MAS:  Left elbow flexors 2/4   Left wrist flexorss 2/4   Left finger flexors 2/4     Left ankle dorsiflexors 2/4   Previous Treatments: Therapy/Range of motion Indication for guidance: Target active muscules  Procedure: Botulinum toxin was mixed with preservative free saline with a dilution of 1cc to 100 units. Targeted limb and muscles were identified. The skin was prepped with alcohol swabs and placement of needle tip in targeted muscle was confirmed using appropriate guidance. Prior to injection, positioning of needle tip outside of blood vessel was determined by pulling back on syringe plunger.  MUSCLE UNITS             Left biceps 100U             Left FCR 50U             Left FCU 50U             Left FDS 75U             Left  FDP 75U   Left Med Gastroc 75  Left Lat Gastroc 75   Total units used: 978 Complications: None  Plan: RTC 6 weeks  Ankit Anil Patel 12:13 PM

## 2018-03-30 DIAGNOSIS — F330 Major depressive disorder, recurrent, mild: Secondary | ICD-10-CM | POA: Diagnosis not present

## 2018-03-31 LAB — CUP PACEART REMOTE DEVICE CHECK
MDC IDC PG IMPLANT DT: 20190306
MDC IDC SESS DTM: 20190713213934

## 2018-04-07 ENCOUNTER — Other Ambulatory Visit: Payer: Self-pay | Admitting: Neurology

## 2018-04-07 ENCOUNTER — Other Ambulatory Visit: Payer: Self-pay | Admitting: Physical Medicine & Rehabilitation

## 2018-04-09 ENCOUNTER — Other Ambulatory Visit: Payer: Self-pay

## 2018-04-09 ENCOUNTER — Encounter: Payer: Self-pay | Admitting: Physical Medicine & Rehabilitation

## 2018-04-09 ENCOUNTER — Encounter: Payer: Medicare Other | Attending: Physical Medicine & Rehabilitation | Admitting: Physical Medicine & Rehabilitation

## 2018-04-09 VITALS — BP 107/78 | HR 80 | Wt 148.8 lb

## 2018-04-09 DIAGNOSIS — F17210 Nicotine dependence, cigarettes, uncomplicated: Secondary | ICD-10-CM | POA: Diagnosis not present

## 2018-04-09 DIAGNOSIS — I6389 Other cerebral infarction: Secondary | ICD-10-CM | POA: Diagnosis present

## 2018-04-09 DIAGNOSIS — M25512 Pain in left shoulder: Secondary | ICD-10-CM

## 2018-04-09 DIAGNOSIS — I10 Essential (primary) hypertension: Secondary | ICD-10-CM | POA: Diagnosis not present

## 2018-04-09 DIAGNOSIS — G8929 Other chronic pain: Secondary | ICD-10-CM | POA: Diagnosis not present

## 2018-04-09 DIAGNOSIS — G479 Sleep disorder, unspecified: Secondary | ICD-10-CM | POA: Insufficient documentation

## 2018-04-09 DIAGNOSIS — S92901D Unspecified fracture of right foot, subsequent encounter for fracture with routine healing: Secondary | ICD-10-CM | POA: Diagnosis not present

## 2018-04-09 DIAGNOSIS — R269 Unspecified abnormalities of gait and mobility: Secondary | ICD-10-CM | POA: Insufficient documentation

## 2018-04-09 DIAGNOSIS — S43002D Unspecified subluxation of left shoulder joint, subsequent encounter: Secondary | ICD-10-CM

## 2018-04-09 DIAGNOSIS — M792 Neuralgia and neuritis, unspecified: Secondary | ICD-10-CM

## 2018-04-09 NOTE — Progress Notes (Signed)
Indication:  Chronic neuropathic shoulder pain and subluxation only partially responsive to physical and occupational therapy and numerous medications.  Informed consent was obtained after discussing risks and benefits of the procedure with patient these include bleeding, bruising,  and infection.  The patient elects to proceed and has given written consent.  The patient was brought into the procedure room and placed in the seated upright position.    The left deltoid area was cleaned and prepped with povidone-iodine with a wide margin and allowed to dry.  Sterile drape was placed with fenestration over the target site.  The peripheral neurostimulator was placed outside of the sterile field over clean skin on the upper back of the affected side.    The skin overlying the lead site was anesthetized with 4cc of 1% Lidocaine. Care was taken to assure that local anesthetic was not administered close to the target neurostimulator lead site to prevent an altered response to stimulation.   Test stimulation was delivered via a 17 gauge percutaneous sleeve and 19 gauge stimulating probe inside the sleeve to assist in identifying the optimal lead location.  The junction of the left lateral and left posterior heads of the deltoid were targeted Once this point was located, the stimulating probe was inserted at the marked point above using sterile technique.  The probe was advanced  to a depth of 2 cm.     A provided test cable was connected to the probe and the intensity was adjusted until comfortable muscle contraction of the left deltoid produced by the neurostimulator was detected by the patient. Once optimal location was identified, stimulation was turned off, the test cable was disconnected from the stimulating probe, and the stimulating probe was removed from the sleeve.  The 20 gauge Microlead Introducer containing the lead was inserted into the percutaneous sleeve and advanced to the established target  depth. The connector box was connected to the de-inuslated end of the lead and the cable was connected to the Stimulator. Optimized stimulation parameters were tested again beginning at slightly lower intensity. Intensity was adjusted until the desired response was obtained. The location and depth of the electrode was noted. Stimulation was turned off. The connector box was  disconnected from the microlead, and the introducer and sleeve were then withdrawn while manual pressure was applied at the exit site at which point the lead was deployed, secured and implanted.A drop of dermabond was placed at the electrode exit site.  The connector box was reconnected and stimulation was again delivered to confirm that the lead did not move upon removal of the introducer. The lead position was confirmed by the presence of comfortable sensations in the painful areas. The lead was coiled to allow for strain relief then threaded through and secured into the connector box and secured in a cradle.  Excess lead was trimmed to length. The cable was inserted into the neurostimulator and the neurostimulator was positioned on the anterior thigh below the lead exit site at which point the final stimulation response again confirmed optimal lead placement.   The area, including the lead, connecting box and cable, was dressed with occlusive dressing and the patient and caregiver were instructed on the proper management of the site and how to operate the External Pulse Generator (EPG) and the hand held patient programmer.   Follow up appt in 3 wks

## 2018-04-20 DIAGNOSIS — F330 Major depressive disorder, recurrent, mild: Secondary | ICD-10-CM | POA: Diagnosis not present

## 2018-04-21 ENCOUNTER — Ambulatory Visit (INDEPENDENT_AMBULATORY_CARE_PROVIDER_SITE_OTHER): Payer: Medicare Other | Admitting: *Deleted

## 2018-04-21 DIAGNOSIS — I6300 Cerebral infarction due to thrombosis of unspecified precerebral artery: Secondary | ICD-10-CM

## 2018-04-22 NOTE — Progress Notes (Signed)
[facility] Summary Report / Loop Recorder 

## 2018-04-27 LAB — CUP PACEART REMOTE DEVICE CHECK
Date Time Interrogation Session: 20190815220804
Implantable Pulse Generator Implant Date: 20190306

## 2018-04-30 ENCOUNTER — Other Ambulatory Visit: Payer: Self-pay | Admitting: Physical Medicine & Rehabilitation

## 2018-04-30 ENCOUNTER — Encounter: Payer: Self-pay | Admitting: Physical Medicine & Rehabilitation

## 2018-04-30 ENCOUNTER — Encounter (HOSPITAL_BASED_OUTPATIENT_CLINIC_OR_DEPARTMENT_OTHER): Payer: Medicare Other | Admitting: Physical Medicine & Rehabilitation

## 2018-04-30 VITALS — BP 125/81 | HR 76 | Resp 14 | Ht 67.0 in | Wt 149.0 lb

## 2018-04-30 DIAGNOSIS — S43002D Unspecified subluxation of left shoulder joint, subsequent encounter: Secondary | ICD-10-CM

## 2018-04-30 DIAGNOSIS — S92901D Unspecified fracture of right foot, subsequent encounter for fracture with routine healing: Secondary | ICD-10-CM | POA: Diagnosis not present

## 2018-04-30 DIAGNOSIS — R209 Unspecified disturbances of skin sensation: Secondary | ICD-10-CM

## 2018-04-30 DIAGNOSIS — G8929 Other chronic pain: Secondary | ICD-10-CM | POA: Diagnosis not present

## 2018-04-30 DIAGNOSIS — G8110 Spastic hemiplegia affecting unspecified side: Secondary | ICD-10-CM | POA: Diagnosis not present

## 2018-04-30 DIAGNOSIS — G479 Sleep disorder, unspecified: Secondary | ICD-10-CM | POA: Diagnosis not present

## 2018-04-30 DIAGNOSIS — M792 Neuralgia and neuritis, unspecified: Secondary | ICD-10-CM | POA: Diagnosis not present

## 2018-04-30 DIAGNOSIS — I69398 Other sequelae of cerebral infarction: Secondary | ICD-10-CM

## 2018-04-30 DIAGNOSIS — I69319 Unspecified symptoms and signs involving cognitive functions following cerebral infarction: Secondary | ICD-10-CM | POA: Diagnosis not present

## 2018-04-30 DIAGNOSIS — R269 Unspecified abnormalities of gait and mobility: Secondary | ICD-10-CM | POA: Diagnosis not present

## 2018-04-30 DIAGNOSIS — I639 Cerebral infarction, unspecified: Secondary | ICD-10-CM

## 2018-04-30 DIAGNOSIS — F4321 Adjustment disorder with depressed mood: Secondary | ICD-10-CM | POA: Diagnosis not present

## 2018-04-30 DIAGNOSIS — I69359 Hemiplegia and hemiparesis following cerebral infarction affecting unspecified side: Secondary | ICD-10-CM

## 2018-04-30 DIAGNOSIS — M25512 Pain in left shoulder: Secondary | ICD-10-CM

## 2018-04-30 DIAGNOSIS — F17210 Nicotine dependence, cigarettes, uncomplicated: Secondary | ICD-10-CM | POA: Diagnosis not present

## 2018-04-30 DIAGNOSIS — I10 Essential (primary) hypertension: Secondary | ICD-10-CM | POA: Diagnosis not present

## 2018-04-30 MED ORDER — AMANTADINE HCL 100 MG PO CAPS: 100.0000 mg | ORAL_CAPSULE | Freq: Two times a day (BID) | ORAL | 1 refills | Status: DC

## 2018-04-30 NOTE — Progress Notes (Signed)
Subjective:    Patient ID: Kelly Lowe, female    DOB: 05-20-1951, 67 y.o.   MRN: [id number]  HPI  67 y.o. female with history of HTN, right foot fractures presents for follow for right basal ganglia infarct.   Last clinic visit 04/09/18.  Daughter present, who supplements majority of history.  Pt has had Botox as well as PNS procedures, 8/22 and 9/5, respectively.  She has completed PT/OT, but goes to CBT. She continues to wear WHO/PRAFO. She continues to have dysesthesias.  She notes numbness in right hand sporadically. She does notes decrease in tightness, but pt states she does not feel improvement in function. Denies falls with cane. She is doing stairs now. She is doing much better with Cymbalta. She did not notice much benefit with Voltaren gel. She notes improvement in shoulder function with SPRINT.   Pain Inventory Average Pain 2 Pain Right Now 2 My pain is intermittent and dull  In the last 24 hours, has pain interfered with the following? General activity 0 Relation with others 0 Enjoyment of life 0 What TIME of day is your pain at its worst? morning Sleep (in general) Good  Pain is worse with: inactivity Pain improves with: heat/ice, therapy/exercise and pacing activities Relief from Meds: n/a  Mobility walk with assistance use a cane how many minutes can you walk? 10-15 ability to climb steps?  yes do you drive?  no Do you have any goals in this area?  yes  Function retired I need assistance with the following:  dressing, bathing, meal prep, household duties and shopping  Neuro/Psych tingling trouble walking spasms dizziness anxiety  Prior Studies Any changes since last visit?  no  Physicians involved in your care Any changes since last visit?  no   Family History  Problem Relation Age of Onset  . Stroke Maternal Grandmother   . Stroke Paternal Grandfather    Social History   Socioeconomic History  . Marital status: Divorced    Spouse  name: Not on file  . Number of children: Not on file  . Years of education: Not on file  . Highest education level: Not on file  Occupational History  . Not on file  Social Needs  . Financial resource strain: Not on file  . Food insecurity:    Worry: Not on file    Inability: Not on file  . Transportation needs:    Medical: Not on file    Non-medical: Not on file  Tobacco Use  . Smoking status: Former Smoker    Packs/day: 0.50    Years: 25.00    Pack years: 12.50    Types: Cigarettes    Last attempt to quit: 05/02/2017    Years since quitting: 0.9  . Smokeless tobacco: Never Used  Substance and Sexual Activity  . Alcohol use: No    Alcohol/week: 1.0 standard drinks    Types: 1 Glasses of wine per week    Frequency: Never    Comment: social  . Drug use: No  . Sexual activity: Never  Lifestyle  . Physical activity:    Days per week: Not on file    Minutes per session: Not on file  . Stress: Not on file  Relationships  . Social connections:    Talks on phone: Not on file    Gets together: Not on file    Attends religious service: Not on file    Active member of club or organization: Not on file  Attends meetings of clubs or organizations: Not on file    Relationship status: Not on file  Other Topics Concern  . Not on file  Social History Narrative  . Not on file   Past Surgical History:  Procedure Laterality Date  . FOOT SURGERY    . LOOP RECORDER INSERTION N/A 10/08/2017   Procedure: LOOP RECORDER INSERTION;  Surgeon: Deboraha Sprang, MD;  Location: [facility];  Service: Cardiovascular;  Laterality: N/A;  . TEE WITHOUT CARDIOVERSION N/A 05/08/2017   Procedure: TRANSESOPHAGEAL ECHOCARDIOGRAM (TEE);  Surgeon: Lelon Perla, MD;  Location: [facility] ENDOSCOPY;  Service: Cardiovascular;  Laterality: N/A;   Past Medical History:  Diagnosis Date  . Hyperlipidemia   . Hypertension   . Stroke ([place])    BP 125/81 (BP Location: Right Arm, Patient Position:  Sitting, Cuff Size: Normal)   Pulse 76   Resp 14   Ht 5\' 7"  (1.702 m)   Wt 149 lb (67.6 kg)   SpO2 96%   BMI 23.34 kg/m   Opioid Risk Score:   Fall Risk Score:  `1  Depression screen PHQ 2/9  Depression screen [facility] 2/9 04/09/2018 01/09/2018 11/28/2017 11/13/2017 06/19/2017 06/19/2017  Decreased Interest 0 0 1 0 0 0  Down, Depressed, Hopeless 0 0 1 2 0 0  PHQ - 2 Score 0 0 2 2 0 0  Altered sleeping - - 1 1 3  -  Tired, decreased energy - - 1 0 2 -  Change in appetite - - 1 0 1 -  Feeling bad or failure about yourself  - - 1 1 1  -  Trouble concentrating - - 1 0 1 -  Moving slowly or fidgety/restless - - 1 1 1  -  Suicidal thoughts - - 0 0 0 -  PHQ-9 Score - - 8 5 9  -  Difficult doing work/chores - - Somewhat difficult Somewhat difficult Not difficult at all -    Review of Systems  HENT: Negative.   Eyes: Negative.   Respiratory: Negative.   Cardiovascular: Negative.   Gastrointestinal: Positive for constipation.       Poor appetite  Endocrine: Negative.   Genitourinary: Negative.   Musculoskeletal: Positive for arthralgias and gait problem.       Spasms  Skin: Positive for rash.  Allergic/Immunologic: Negative.   Neurological: Positive for dizziness.       Tingling   Hematological: Negative.   Psychiatric/Behavioral: The patient is nervous/anxious.        Mother passed away and is very emotional  All other systems reviewed and are negative.     Objective:   Physical ExamConstitutional: She appears well-developed and well-nourished. NAD. HENT: Normocephalic and atraumatic.  Eyes: EOMI. No discharge.  Cardiovascular: RRR. No JVD. Respiratory: No respiratory distress. Clear.  GI: She exhibits no distension. BS+ Musculoskeletal: Shoulder subluxation ~1 finger LUE.  Bony prominence distal lateral radius  Negative durkin's, phalen's, tinnel's at right median wrist Neurological:  Motor: LUE: 3-/5 shoulder abduction, elbow flex 2+/5, elbow ext 2/5, hand grip 2+-3-/5  LLE:  4-/5 HF, 4-/5 KE, ADF/PF 2/5  mAS left elbow flexors 1+/4, wrist flexors 1/4, finger flexors 1/4 Left ADF/PF 1+/4 Follows commands.  Insight and awareness into deficits.  Skin: Skin is warm.  Psychiatric: Normal mood and behavior.     Assessment & Plan:  67 y.o. female with history of HTN, right foot fractures presents for follow for right basal ganglia infarct.   1.  Left spastic hemiparesis, fatigue, and functional  deficits secondary to right basal ganglia infarct  Cont HEP  Cont WHO/PRAFO  Last visit Botox injected, will change to Dysport on next visit:     Left biceps 300U  Left FCR 100U   Left FCU 100U  Left FDS 200U  Left FDP 200U               Left Med Gastroc 150   Left Lat Gastroc 150  Will order amantadine 100 with breakfast and lunch  2. Gait abnormality  Cont cane for safety  3. Sleep disturbance  Improving   4. Mood lability - Improved  Cont Cymbalta to 60mg  daily  Will consider trial of Nudexta if necessary  Cont follow up with Psychology  5. Left shoulder subluxation with pain  Kinesio taping on hold due to reaction to tape  SPRINT placed on 9/5  Tramadol per PCP (educated on signs/symptoms serotonin syndrome)  6. Bony prominence right wrist  Xray with bony prominence  Voltaren gel in effective  Will inject today   7. Right hand numbness  Intermittent, likely positional, encouraged diary  >45 spent with patient and daughter with >35 minutes in counseling regarding stroke, recovery, intervention options  Right wrist injection   Indication: Wrist pain not relieved by medication management and other conservative care.  Informed consent was obtained after describing risks and benefits of the procedure with the patient, this includes bleeding, bruising, infection and medication side effects. The patient wishes to proceed and has given written consent. Patient was placed in a seated position. The right  distal wrist was marked and prepped with betadine in the apex of the anatomic snuff box. Patient applied traction.  Vapocoolant spray was applied. A 30-gauge 1/2 inch needle was inserted into the joint space. After negative draw back for blood, a solution containing 0.5 mL of 6 mg per ML celestone and 0.5 mL of 2% lidocaine was injected. A band aid was applied. The patient tolerated the procedure well. Post procedure instructions were given.

## 2018-04-30 NOTE — Telephone Encounter (Signed)
We need to see if it is effective or if she can tolerate it before providing a 90 day supply.  Thanks.

## 2018-05-03 LAB — CUP PACEART REMOTE DEVICE CHECK
Date Time Interrogation Session: 20190917223803
MDC IDC PG IMPLANT DT: 20190306

## 2018-05-07 DIAGNOSIS — F330 Major depressive disorder, recurrent, mild: Secondary | ICD-10-CM | POA: Diagnosis not present

## 2018-05-18 DIAGNOSIS — F330 Major depressive disorder, recurrent, mild: Secondary | ICD-10-CM | POA: Diagnosis not present

## 2018-05-25 ENCOUNTER — Ambulatory Visit (INDEPENDENT_AMBULATORY_CARE_PROVIDER_SITE_OTHER): Payer: Medicare Other | Admitting: *Deleted

## 2018-05-25 DIAGNOSIS — I6300 Cerebral infarction due to thrombosis of unspecified precerebral artery: Secondary | ICD-10-CM | POA: Diagnosis not present

## 2018-05-25 NOTE — Progress Notes (Signed)
[facility] Summary Report / Loop Recorder 

## 2018-05-27 DIAGNOSIS — F330 Major depressive disorder, recurrent, mild: Secondary | ICD-10-CM | POA: Diagnosis not present

## 2018-05-28 DIAGNOSIS — Z23 Encounter for immunization: Secondary | ICD-10-CM | POA: Diagnosis not present

## 2018-05-28 DIAGNOSIS — I10 Essential (primary) hypertension: Secondary | ICD-10-CM | POA: Diagnosis not present

## 2018-05-28 DIAGNOSIS — G4700 Insomnia, unspecified: Secondary | ICD-10-CM | POA: Diagnosis not present

## 2018-05-28 DIAGNOSIS — G8194 Hemiplegia, unspecified affecting left nondominant side: Secondary | ICD-10-CM | POA: Diagnosis not present

## 2018-05-28 DIAGNOSIS — E785 Hyperlipidemia, unspecified: Secondary | ICD-10-CM | POA: Diagnosis not present

## 2018-05-30 ENCOUNTER — Other Ambulatory Visit: Payer: Self-pay | Admitting: Physical Medicine & Rehabilitation

## 2018-06-01 NOTE — Telephone Encounter (Signed)
Called pharmacy to inform prescription was sent in 04-30-18 with one refill therefore it should be a refill on file to fill.

## 2018-06-10 ENCOUNTER — Encounter: Payer: Medicare Other | Attending: Physical Medicine & Rehabilitation | Admitting: Physical Medicine & Rehabilitation

## 2018-06-10 ENCOUNTER — Encounter: Payer: Self-pay | Admitting: Physical Medicine & Rehabilitation

## 2018-06-10 VITALS — BP 120/76 | HR 81 | Ht 67.0 in | Wt 137.0 lb

## 2018-06-10 DIAGNOSIS — S43002D Unspecified subluxation of left shoulder joint, subsequent encounter: Secondary | ICD-10-CM

## 2018-06-10 DIAGNOSIS — F17210 Nicotine dependence, cigarettes, uncomplicated: Secondary | ICD-10-CM | POA: Insufficient documentation

## 2018-06-10 DIAGNOSIS — S92901D Unspecified fracture of right foot, subsequent encounter for fracture with routine healing: Secondary | ICD-10-CM | POA: Insufficient documentation

## 2018-06-10 DIAGNOSIS — M792 Neuralgia and neuritis, unspecified: Secondary | ICD-10-CM | POA: Diagnosis not present

## 2018-06-10 DIAGNOSIS — G8110 Spastic hemiplegia affecting unspecified side: Secondary | ICD-10-CM | POA: Diagnosis not present

## 2018-06-10 DIAGNOSIS — R269 Unspecified abnormalities of gait and mobility: Secondary | ICD-10-CM | POA: Diagnosis not present

## 2018-06-10 DIAGNOSIS — I10 Essential (primary) hypertension: Secondary | ICD-10-CM | POA: Diagnosis not present

## 2018-06-10 DIAGNOSIS — M25512 Pain in left shoulder: Secondary | ICD-10-CM | POA: Diagnosis not present

## 2018-06-10 DIAGNOSIS — I69359 Hemiplegia and hemiparesis following cerebral infarction affecting unspecified side: Secondary | ICD-10-CM | POA: Diagnosis not present

## 2018-06-10 DIAGNOSIS — G8929 Other chronic pain: Secondary | ICD-10-CM

## 2018-06-10 DIAGNOSIS — I6389 Other cerebral infarction: Secondary | ICD-10-CM | POA: Diagnosis present

## 2018-06-10 DIAGNOSIS — G479 Sleep disorder, unspecified: Secondary | ICD-10-CM | POA: Diagnosis not present

## 2018-06-10 DIAGNOSIS — I639 Cerebral infarction, unspecified: Secondary | ICD-10-CM | POA: Diagnosis not present

## 2018-06-10 MED ORDER — GABAPENTIN 100 MG PO CAPS: 100.0000 mg | ORAL_CAPSULE | Freq: Every day | ORAL | 1 refills | Status: DC

## 2018-06-10 NOTE — Progress Notes (Signed)
Subjective:    Patient ID: Kelly Lowe, female    DOB: 02/16/1951, 67 y.o.   MRN: [id number]  HPI  67 y.o. female with history of HTN, right foot fractures presents for follow for right basal ganglia infarct.   Last clinic visit 04/30/18.  Daughter present, who supplements majority of history.  She notes benefits, but side effects with Amantadine. She had improvement in pain after SPRINT. She had resolution of thumb pain. She is hear for removal of SPRINT.    Pain Inventory Average Pain 2 Pain Right Now 2 My pain is intermittent and dull  In the last 24 hours, has pain interfered with the following? General activity 0 Relation with others 0 Enjoyment of life 0 What TIME of day is your pain at its worst? morning Sleep (in general) Good  Pain is worse with: inactivity Pain improves with: heat/ice, therapy/exercise and pacing activities Relief from Meds: n/a  Mobility walk with assistance use a cane how many minutes can you walk? 10-15 ability to climb steps?  yes do you drive?  no Do you have any goals in this area?  yes  Function retired I need assistance with the following:  dressing, bathing, meal prep, household duties and shopping  Neuro/Psych tingling trouble walking spasms dizziness anxiety  Prior Studies Any changes since last visit?  no  Physicians involved in your care Any changes since last visit?  no   Family History  Problem Relation Age of Onset  . Stroke Maternal Grandmother   . Stroke Paternal Grandfather    Social History   Socioeconomic History  . Marital status: Divorced    Spouse name: Not on file  . Number of children: Not on file  . Years of education: Not on file  . Highest education level: Not on file  Occupational History  . Not on file  Social Needs  . Financial resource strain: Not on file  . Food insecurity:    Worry: Not on file    Inability: Not on file  . Transportation needs:    Medical: Not on file   Non-medical: Not on file  Tobacco Use  . Smoking status: Former Smoker    Packs/day: 0.50    Years: 25.00    Pack years: 12.50    Types: Cigarettes    Last attempt to quit: 05/02/2017    Years since quitting: 1.1  . Smokeless tobacco: Never Used  Substance and Sexual Activity  . Alcohol use: No    Alcohol/week: 1.0 standard drinks    Types: 1 Glasses of wine per week    Frequency: Never    Comment: social  . Drug use: No  . Sexual activity: Never  Lifestyle  . Physical activity:    Days per week: Not on file    Minutes per session: Not on file  . Stress: Not on file  Relationships  . Social connections:    Talks on phone: Not on file    Gets together: Not on file    Attends religious service: Not on file    Active member of club or organization: Not on file    Attends meetings of clubs or organizations: Not on file    Relationship status: Not on file  Other Topics Concern  . Not on file  Social History Narrative  . Not on file   Past Surgical History:  Procedure Laterality Date  . FOOT SURGERY    . LOOP RECORDER INSERTION N/A 10/08/2017  Procedure: LOOP RECORDER INSERTION;  Surgeon: Deboraha Sprang, MD;  Location: [facility];  Service: Cardiovascular;  Laterality: N/A;  . TEE WITHOUT CARDIOVERSION N/A 05/08/2017   Procedure: TRANSESOPHAGEAL ECHOCARDIOGRAM (TEE);  Surgeon: Lelon Perla, MD;  Location: [facility];  Service: Cardiovascular;  Laterality: N/A;   Past Medical History:  Diagnosis Date  . Hyperlipidemia   . Hypertension   . Stroke (HCC)    BP 120/76   Pulse 81   Ht 5\' 7"  (1.702 m)   Wt 137 lb (62.1 kg)   SpO2 94%   BMI 21.46 kg/m   Opioid Risk Score:   Fall Risk Score:  `1  Depression screen PHQ 2/9  Depression screen [facility] 2/9 04/09/2018 01/09/2018 11/28/2017 11/13/2017 06/19/2017 06/19/2017  Decreased Interest 0 0 1 0 0 0  Down, Depressed, Hopeless 0 0 1 2 0 0  PHQ - 2 Score 0 0 2 2 0 0  Altered sleeping - - 1 1 3  -  Tired, decreased  energy - - 1 0 2 -  Change in appetite - - 1 0 1 -  Feeling bad or failure about yourself  - - 1 1 1  -  Trouble concentrating - - 1 0 1 -  Moving slowly or fidgety/restless - - 1 1 1  -  Suicidal thoughts - - 0 0 0 -  PHQ-9 Score - - 8 5 9  -  Difficult doing work/chores - - Somewhat difficult Somewhat difficult Not difficult at all -    Review of Systems  HENT: Negative.   Eyes: Negative.   Respiratory: Negative.   Cardiovascular: Negative.   Gastrointestinal: Positive for constipation.       Poor appetite  Endocrine: Negative.   Genitourinary: Negative.   Musculoskeletal: Positive for arthralgias.       Spasms  Skin: Positive for rash.  Allergic/Immunologic: Negative.   Neurological: Positive for weakness.       Tingling   Hematological: Negative.   Psychiatric/Behavioral: The patient is nervous/anxious.        Mother passed away and is very emotional  All other systems reviewed and are negative.     Objective:   Physical ExamConstitutional: She appears well-developed and well-nourished. NAD. HENT: Normocephalic and atraumatic.  Eyes: EOMI. No discharge.  Cardiovascular: RRR. No  JVD. Respiratory: No respiratory distress. Clear.  GI: She exhibits no distension. BS+ Musculoskeletal: Shoulder subluxation ~1 finger LUE, slightly improved.  Neurological:  Motor: LUE: 3-/5 shoulder abduction, elbow flex 2+/5, elbow ext 2/5, hand grip 2+-3-/5  LLE: 4-/5 HF, 4-/5 KE, ADF/PF 2/5  mAS left elbow flexors 1+/4, wrist flexors 1/4, finger flexors 1/4 Left ADF/PF 1+/4 Follows commands.  Insight and awareness into deficits.  Skin: Skin is warm.  Psychiatric: Normal mood and behavior.     Assessment & Plan:  67 y.o. female with history of HTN, right foot fractures presents for follow for right basal ganglia infarct.   1.  Left spastic hemiparesis, fatigue, and functional deficits secondary to right basal ganglia infarct  Cont HEP  Cont WHO/PRAFO  Dysport on next visit:      Left biceps 300U  Left FCR 100U   Left FCU 100U  Left FDS 200U  Left FDP 200U               Left Med Gastroc 150   Left Lat Gastroc 150  Cont amantadine 100 with breakfast and lunch  Trail Gabapentin 100qhs, may increase if no side effects  2. Gait  abnormality  Cont cane for safety  3. Sleep disturbance  Improving   4. Mood lability - Improved  Cont Cymbalta to 60mg  daily  Will consider trial of Nudexta if necessary  Cont follow up with Psychology  5. Left shoulder subluxation with pain  Kinesio taping on hold due to reaction to tape  SPRINT placed on 9/5, removed on 11/6  Tramadol per PCP (educated on signs/symptoms serotonin syndrome)  6. Bony prominence right wrist  Xray with bony prominence  Voltaren gel in effective  Good benefits with injection on 9/26  7. Right hand numbness  Intermittent, likely positional, encouraged diary   SPRINT Removal: Tissue around wire electrode was broken up with gentile massage. Connector box was removed from casing.  Pressure was applied to surrounding tissue and electrode wire was gradually removed.  Adhesive and connector box was removed.  No complications.

## 2018-06-12 LAB — CUP PACEART REMOTE DEVICE CHECK
Date Time Interrogation Session: 20191020233940
MDC IDC PG IMPLANT DT: 20190306

## 2018-06-26 ENCOUNTER — Ambulatory Visit (INDEPENDENT_AMBULATORY_CARE_PROVIDER_SITE_OTHER): Payer: Medicare Other

## 2018-06-26 DIAGNOSIS — I6300 Cerebral infarction due to thrombosis of unspecified precerebral artery: Secondary | ICD-10-CM | POA: Diagnosis not present

## 2018-06-28 ENCOUNTER — Other Ambulatory Visit: Payer: Self-pay | Admitting: Physical Medicine & Rehabilitation

## 2018-06-29 NOTE — Progress Notes (Signed)
[facility] Summary Report / Loop Recorder 

## 2018-07-01 ENCOUNTER — Encounter: Payer: Self-pay | Admitting: Physical Medicine & Rehabilitation

## 2018-07-01 ENCOUNTER — Encounter (HOSPITAL_BASED_OUTPATIENT_CLINIC_OR_DEPARTMENT_OTHER): Payer: Medicare Other | Admitting: Physical Medicine & Rehabilitation

## 2018-07-01 VITALS — BP 125/78 | HR 82 | Ht 67.0 in | Wt 137.0 lb

## 2018-07-01 DIAGNOSIS — I10 Essential (primary) hypertension: Secondary | ICD-10-CM | POA: Diagnosis not present

## 2018-07-01 DIAGNOSIS — F17210 Nicotine dependence, cigarettes, uncomplicated: Secondary | ICD-10-CM | POA: Diagnosis not present

## 2018-07-01 DIAGNOSIS — G8110 Spastic hemiplegia affecting unspecified side: Secondary | ICD-10-CM | POA: Diagnosis not present

## 2018-07-01 DIAGNOSIS — R269 Unspecified abnormalities of gait and mobility: Secondary | ICD-10-CM | POA: Diagnosis not present

## 2018-07-01 DIAGNOSIS — S92901D Unspecified fracture of right foot, subsequent encounter for fracture with routine healing: Secondary | ICD-10-CM | POA: Diagnosis not present

## 2018-07-01 DIAGNOSIS — G479 Sleep disorder, unspecified: Secondary | ICD-10-CM | POA: Diagnosis not present

## 2018-07-01 DIAGNOSIS — I69359 Hemiplegia and hemiparesis following cerebral infarction affecting unspecified side: Secondary | ICD-10-CM

## 2018-07-01 NOTE — Progress Notes (Signed)
Dysport: Procedure Note Patient Name: Kelly Lowe DOB: [birth year] MRN: [id number] Date: 07/01/18  Procedure: Botulinum toxin administration Guidance: EMG Diagnosis: Left spastic hemiparesis secondary to right basal ganglia infarct Attending: Delice Lesch, MD    Informed consent: Risks, benefits & options of the procedure are explained to the patient (and/or family). The patient elects to proceed with procedure. Risks include but are not limited to weakness, respiratory distress, dry mouth, ptosis, antibody formation, worsening of some areas of function. Benefits include decreased abnormal muscle tone, improved hygiene and positioning, decreased skin breakdown and, in some cases, decreased pain. Options include conservative management with oral antispasticity agents, phenol chemodenervation of nerve or at motor nerve branches. More invasive options include intrathecal balcofen adminstration for appropriate candidates. Surgical options may include tendon lengthening or transposition or, rarely, dorsal rhizotomy.   History/Physical Examination: 67 y.o. femalewith history of HTN, right foot fractures presents for follow for right basal ganglia infarct.   mAS left   elbow flexors: 1+/4  wrist flexors 1/4  finger flexors 1/4  Left ADF/PF 1+/4 Previous Treatments: Therapy/Range of motion Indication for guidance: Target active muscules  Procedure: Botulinum toxin was mixed with preservative free saline with a dilution of 0.5cc to 100 units. Targeted limb and muscles were identified. The skin was prepped with alcohol swabs and placement of needle tip in targeted muscle was confirmed using appropriate guidance. Prior to injection, positioning of needle tip outside of blood vessel was determined by pulling back on syringe plunger.  MUSCLE UNITS Dysport on next visit:   Left biceps300U  Left FCR 100U   Left FCU 100U  Left FDS 200U  Left FDP 200U   Left Med Gastroc 150  Left Lat Gastroc  150   Total units used: 7366 Units  Complications: None  Plan: 6 weeks  Kelly Lowe Kelly Lowe Kelly Lowe 12:22 PM

## 2018-07-05 ENCOUNTER — Other Ambulatory Visit: Payer: Self-pay | Admitting: Neurology

## 2018-07-13 DIAGNOSIS — I130 Hypertensive heart and chronic kidney disease with heart failure and stage 1 through stage 4 chronic kidney disease, or unspecified chronic kidney disease: Secondary | ICD-10-CM | POA: Diagnosis not present

## 2018-07-19 ENCOUNTER — Other Ambulatory Visit: Payer: Self-pay | Admitting: Neurology

## 2018-07-20 ENCOUNTER — Other Ambulatory Visit: Payer: Self-pay

## 2018-07-20 MED ORDER — BACLOFEN 10 MG PO TABS: ORAL_TABLET | ORAL | 1 refills | Status: DC

## 2018-07-29 ENCOUNTER — Other Ambulatory Visit: Payer: Self-pay | Admitting: Physical Medicine & Rehabilitation

## 2018-07-30 ENCOUNTER — Ambulatory Visit (INDEPENDENT_AMBULATORY_CARE_PROVIDER_SITE_OTHER): Payer: Medicare Other

## 2018-07-30 DIAGNOSIS — I6300 Cerebral infarction due to thrombosis of unspecified precerebral artery: Secondary | ICD-10-CM

## 2018-07-30 LAB — CUP PACEART REMOTE DEVICE CHECK
Implantable Pulse Generator Implant Date: 20190306
MDC IDC SESS DTM: 20191226011006

## 2018-07-30 NOTE — Progress Notes (Signed)
[facility] Summary Report / Loop Recorder 

## 2018-08-10 ENCOUNTER — Telehealth: Payer: Self-pay | Admitting: Adult Health

## 2018-08-10 ENCOUNTER — Encounter: Payer: Self-pay | Admitting: Adult Health

## 2018-08-10 ENCOUNTER — Ambulatory Visit: Payer: Medicare Other | Admitting: Adult Health

## 2018-08-10 ENCOUNTER — Ambulatory Visit (INDEPENDENT_AMBULATORY_CARE_PROVIDER_SITE_OTHER): Payer: Medicare Other | Admitting: Adult Health

## 2018-08-10 VITALS — BP 117/76 | HR 72 | Ht 67.0 in | Wt 146.0 lb

## 2018-08-10 DIAGNOSIS — I69354 Hemiplegia and hemiparesis following cerebral infarction affecting left non-dominant side: Secondary | ICD-10-CM

## 2018-08-10 DIAGNOSIS — G8929 Other chronic pain: Secondary | ICD-10-CM

## 2018-08-10 DIAGNOSIS — F482 Pseudobulbar affect: Secondary | ICD-10-CM

## 2018-08-10 DIAGNOSIS — G8110 Spastic hemiplegia affecting unspecified side: Secondary | ICD-10-CM

## 2018-08-10 DIAGNOSIS — M25512 Pain in left shoulder: Secondary | ICD-10-CM | POA: Diagnosis not present

## 2018-08-10 MED ORDER — DEXTROMETHORPHAN-QUINIDINE 20-10 MG PO CAPS: ORAL_CAPSULE | ORAL | 0 refills | Status: DC

## 2018-08-10 NOTE — Telephone Encounter (Signed)
Pt daughter called, no DPR on file and stated that the pharmacy is needing P.A. for Dextromethorphan-quiNIDine (NUEDEXTA) 20-10 MG capsule. Please advise.

## 2018-08-10 NOTE — Telephone Encounter (Signed)
Called and left message asking for patient to call me back about referral thanks Hinton Dyer.

## 2018-08-10 NOTE — Patient Instructions (Addendum)
Continue aspirin 325 mg daily  and Lipitor for secondary stroke prevention  Continue to follow up with PCP regarding blood pressure and cholesterol management   Start Nuedexta 1 tab daily for 7 days and then increase to 2 times daily   Referral placed to orthopedics for continued shoulder pain for possible treatment/management  We can consider restarting therapies once you are cleared by orthopedics   Continue to follow with Dr. Posey Pronto for Botox injections for continued left-sided muscle tightness  Continue to monitor blood pressure at home  Maintain strict control of hypertension with blood pressure goal below 130/90, diabetes with hemoglobin A1c goal below 6.5% and cholesterol with LDL cholesterol (bad cholesterol) goal below 70 mg/dL. I also advised the patient to eat a healthy diet with plenty of whole grains, cereals, fruits and vegetables, exercise regularly and maintain ideal body weight.  Followup in the future with me in 2 months or call earlier if needed       Thank you for coming to see [place] at [facility]. I hope we have been able to provide you high quality care today.  You may receive a patient satisfaction survey over the next few weeks. We would appreciate your feedback and comments so that we may continue to improve ourselves and the health of our patients.

## 2018-08-10 NOTE — Progress Notes (Signed)
[facility] [address] [place]. [place] [address] [phone]       OFFICE FOLLOW-UP NOTE  [name] Date of Birth:  [birth year] Medical Record Number:  [id number]   Chief Complaint  Patient presents with  . Follow-up    pt here with daughter. pt here follow up today.     HPI: Initial visit 08/22/2017 Dr. Leonie Man :Kelly Lowe is seen today for the first office follow-up visit following hospital admission for stroke in September  2018. She is accompanied by her daughter. History is obtained from them as well as review of electronic medical records. I have personally reviewed imaging films.Kelly Grantham Pattersonis a 68 y.o.femalelast in her normal state of health last night around 11 which went to bed.. She awoke with left-sided weakness. Due to the presence of gaze deviation and weakness, code stroke was activated and she was taken for CT perfusion/angiogram. This was negative for large vessel occlusion. LKW: 05/02/17 night prior to bed.tpa given?: no, outside of window. CT angiogram was negative for large vessel stenosis or occlusion. MRI scan of the brain showed a large 4.4 x 2.1 x 3.2 cm acute ischemic infarct involving the right basal ganglia with a tiny punctate infarct involving the left splenium of corpus callosum. Transthoracic echo showed normal ejection fraction and trans-esophageal echocardiogram showed no evidence of PFO or cardiac source of embolism. Lower extremity venous Doppler showed a small gastrocnemius vein clot. LDL cholesterol 114 mg percent and implement A1c was 5.4. Transcranial Doppler bubble study was negative for PFO Patient was started on aspirin for stroke prevention. She states she's done well since discharge and is progressing with physical therapy but yet has significant residual spastic left hemiplegia. She is able to use a cane because she is a wheelchair for long distances. Is working with therapy twice a week. She has quit smoking  completely. She has lost about 12 pounds weight as well. He has not yet had a loop recorder inserted and has questions about the procedure. She wants to walk better with improved balance. She does have a left foot brace but she is not happy that she has to use it all the time.  Update 12/08/2017 Dr. Leonie Man: she returns for follow-up after last by daughter.es to have spastic left hemiplegia and is now walks with a hemiwalke She is seeing Dr. Delice Lesch and getting Botox to her left arm and leg. She feels that that has been no significant improvement so far. She is complaining of increased pain and weakness in in her right wrist which may be related to  muscle overuse or carpal tunnel as she predominantly uses her right hand to hold her hemiwalker and grabs it tightly as she is scared to fallShe states her blood pressure is well controlled today it is 112/68. She is tolerating aspirin well without bleeding or bruising. She still has some residual   and numbness from her stroke which has not yet improved. She is tolerating aspirin well without bruising or bleeding. Her blood pressure is well controlled and today it is 112/68. She has had no falls or injuries.  Interval history 08/10/2018: Patient is being seen today for 67-month follow-up visit and is accompanied by her daughter.  She did undergo EMG/nerve conduction study which was normal without any evidence of nerve impingement.  She continues to follow with Dr. Posey Pronto and physical medicine for Botox injections due to continued post stroke spasticity. She does not believe this has provided any benefit.  They do have scheduled f//u appt on 08/20/18.  Daughter is also concerned regarding her left shoulder and was told this was "dislocated" with multiple treatment attempts by physical medicine but she continues to experience pain and decreased range of motion in that shoulder.  Daughter is concerned as this could be preventing possible further recovery and is requesting  referral to orthopedics.  Patient has recently moved back where she lives independently in 07/2018 (previously living with her daughter).  Daughter does continue to assist minimally with bathing and dressing but does need further assistance with IADLs.  She continues to ambulate with her cane and denies any recent falls.  Daughter also has concerns regarding her mood and "secluding herself".  Patient started to become tearful during appointment stating that she is fearful of contacting any of her friends as she is "unable to communicate and they well understand what I am saying".  She has trialed Prozac in the past but discontinued due to possible side effects and continues on Cymbalta without much benefit.  Daughter does state that initially she would have outbursts of laughing or become amused easily but more recently she has been experiencing crying outbursts and not necessarily during appropriate times.  She continues on aspirin without side effects of bleeding or bruising.  Continues on atorvastatin without side effects myalgias.  Blood pressure today 117/76.  Daughter is questioning whether antihypertensives can start to be weaned off as her blood pressures consistently on the lower side.  Loop recorder has not shown atrial fibrillation thus far.  No further concerns at this time.  Denies new or worsening stroke/TIA symptoms.     ROS:   14 system review of systems is positive for chills, eye redness, light sensitivity, blurred vision, leg swelling, palpitations, cold intolerance, constipation, daytime sleepiness, difficulty urinating, joint pain, joint swelling, muscle cramps, rash, itching, dizziness, numbness, weakness, agitation, confusion and depression and all other systems negative  PMH:  Past Medical History:  Diagnosis Date  . Hyperlipidemia   . Hypertension   . Stroke [facility])     Social History:  Social History   Socioeconomic History  . Marital status: Divorced    Spouse name: Not  on file  . Number of children: Not on file  . Years of education: Not on file  . Highest education level: Not on file  Occupational History  . Not on file  Social Needs  . Financial resource strain: Not on file  . Food insecurity:    Worry: Not on file    Inability: Not on file  . Transportation needs:    Medical: Not on file    Non-medical: Not on file  Tobacco Use  . Smoking status: Former Smoker    Packs/day: 0.50    Years: 25.00    Pack years: 12.50    Types: Cigarettes    Last attempt to quit: 05/02/2017    Years since quitting: 1.2  . Smokeless tobacco: Never Used  Substance and Sexual Activity  . Alcohol use: No    Alcohol/week: 1.0 standard drinks    Types: 1 Glasses of wine per week    Frequency: Never    Comment: social  . Drug use: No  . Sexual activity: Never  Lifestyle  . Physical activity:    Days per week: Not on file    Minutes per session: Not on file  . Stress: Not on file  Relationships  . Social connections:    Talks on phone: Not on file  Gets together: Not on file    Attends religious service: Not on file    Active member of club or organization: Not on file    Attends meetings of clubs or organizations: Not on file    Relationship status: Not on file  . Intimate partner violence:    Fear of current or ex partner: Not on file    Emotionally abused: Not on file    Physically abused: Not on file    Forced sexual activity: Not on file  Other Topics Concern  . Not on file  Social History Narrative  . Not on file    Medications:   Current Outpatient Medications on File Prior to Visit  Medication Sig Dispense Refill  . acetaminophen (TYLENOL) 325 MG tablet Take 2 tablets (650 mg total) by mouth every 6 (six) hours as needed for mild pain (or Fever >/= 101). 30 tablet   . amLODipine (NORVASC) 5 MG tablet Take 5 mg by mouth daily.  1  . aspirin 325 MG tablet Take 1 tablet (325 mg total) by mouth daily. 30 tablet 0  . atorvastatin (LIPITOR)  40 MG tablet Take 1 tablet (40 mg total) by mouth daily at 6 PM. 30 tablet 0  . baclofen (LIORESAL) 10 MG tablet TAKE 1 TABLET(10 MG) BY MOUTH AT BEDTIME 90 tablet 1  . Biotin 1000 MCG tablet Take 1,000 mcg by mouth daily.    . diclofenac sodium (VOLTAREN) 1 % GEL Apply 2 g topically 4 (four) times daily. (Patient taking differently: Apply 2 g topically 4 (four) times daily as needed (for pain.). ) 1 Tube 1  . DULoxetine (CYMBALTA) 60 MG capsule TAKE 1 CAPSULE(60 MG) BY MOUTH DAILY 90 capsule 1  . hydrochlorothiazide (HYDRODIURIL) 12.5 MG tablet Take 12.5 mg by mouth every morning.  1  . Melatonin 3 MG TABS Take 3 mg by mouth at bedtime.     . polyethylene glycol (MIRALAX / GLYCOLAX) packet Take 17 g by mouth daily as needed (for constipation.).     Marland Kitchen vitamin B-12 (CYANOCOBALAMIN) 1000 MCG tablet Take 1,000 mcg by mouth daily.    . Vitamins/Minerals TABS Take by mouth.     No current facility-administered medications on file prior to visit.     Allergies:  No Known Allergies  Physical Exam General: Frail elderly African-Americanlady seated, in no evident distress Head: head normocephalic and atraumatic.  Neck: supple with no carotid or supraclavicular bruits Cardiovascular: regular rate and rhythm, no murmurs Musculoskeletal: no deformity Skin:  no rash/petichiae Vascular:  Normal pulses all extremities Vitals:   08/10/18 1003  BP: 117/76  Pulse: 72   Neurologic Exam Mental Status: Awake and fully alert. Oriented to place and time. Recent and remote memory intact. Attention span, concentration and fund of knowledge appropriate. Mood and affect appropriate but did become tearful during appointment and was difficult for patient to stop. Cranial Nerves: Pupils equal, briskly reactive to light. Extraocular movements full without nystagmus. Visual fields full to confrontation. Hearing intact. Facial sensation intact. Mild left lower facial asymmetry, tongue, palate moves normally and  symmetrically.  Motor: Spastic left hemiplegia with 2/5 left upper extremity strength with significant weakness of grip and intrinsic hand muscles. 3/5 strength of the left lower extremity with left ankle foot drop with 1/5 strength. Increased spasticity in left upper and lower extremities Sensory.: intact to touch ,pinprick .position and vibratory sensation.  Coordination: Rapid alternating movements normal in right upper and lower extremities. Finger-to-nose and heel-to-shin performed inaccurately  on left side Gait and Station: Arises from chair with  difficulty. Stance is normal. Uses a cane. Has circumduction of left leg with foot drop and foot drop brace in place.  Reflexes: 2+ and asymmetric and brisker on the left compared to the right Toes downgoing.   CNS-LS for PBA: 19   ASSESSMENT: 64 year lady with right basal ganglia large infarct of cryptogenic etiology in October 2018 with significant residual spastic left hemiplegia. Vascular risk factors of hypertension hyperlipidemia and smoking.  Patient is being seen today for six-month follow-up visit and does continue to have left hemiparesis with spasticity post stroke.     PLAN: 1. Right BG infarct: Continue aspirin 325 mg daily  and atorvastatin 40 mg for secondary stroke prevention. Maintain strict control of hypertension with blood pressure goal below 130/90, diabetes with hemoglobin A1c goal below 6.5% and cholesterol with LDL cholesterol (bad cholesterol) goal below 70 mg/dL.  I also advised the patient to eat a healthy diet with plenty of whole grains, cereals, fruits and vegetables, exercise regularly with at least 30 minutes of continuous activity daily and maintain ideal body weight. 2. HTN: Advised to continue current treatment regimen.  Today's BP 117/76.  Advised to continue to monitor at home along with continued follow-up with PCP for management along with daughter questioning whether antihypertensives can start to be tapered as  requested by daughter 89. HLD: Advised to continue current treatment regimen along with continued follow-up with PCP for future prescribing and monitoring of lipid panel 4. Post stroke spastic left hemiparesis: Continue to follow with Dr. Posey Pronto for Botox injections.  Daughter questioning possibly initiating outpatient therapies.  As discussed with daughter, we will hold off until after orthopedics appointment and possibly initiate PT/OT at that time. Home health referral placed for aid assistance.  5. Left shoulder pain: Referral placed to orthopedics as requested by daughter for possible intervention/treatment due to continued pain with left arm movement.  Discussion with daughter regarding Dr. Ena Dawley prior treatments along with likely cause from stroke and left hemiparesis and can get a second opinion from orthopedics 6. Pseudobulbar affect post stroke: Initiate Nuedexta 1 tab for 7 days then twice daily thereafter  Follow up in 2 months or call earlier if needed    Greater than 50% of time during this 25 minute visit was spent on counseling,explanation of diagnosis, planning of further management, discussion with patient and family and coordination of care  Venancio Poisson, AGNP-BC  [facility] [address] [place], [place] [address]  Phone [phone] Fax [phone] Note: This document was prepared with digital dictation and possible smart phrase technology. Any transcriptional errors that result from this process are unintentional.

## 2018-08-10 NOTE — Telephone Encounter (Signed)
AVS printed out after appointment along with information regarding Nuedexta provided by up-to-date patient education.  Unsure if she left this in the office somewhere.  Hilda Blades, if you could please mail AVS and information regarding Nuedexta which Katrina was provided with.  Thank you.

## 2018-08-10 NOTE — Telephone Encounter (Signed)
AVS printout,and pamphlet on Nuedexta given to Hilda Blades in medical records. Everything will be mail out certified mail for pts signature to ensure she receives it.

## 2018-08-10 NOTE — Telephone Encounter (Signed)
Rn call patients daughter [name] about medication needing  Nuedexta needing PA. Rn stated Nuedexta always requires a PA. RN also stated it can take up to 7 to 10 days for approval or denial. [name] also stated they did not get a print out of today's visit or any information on the medication. Rn stated the AVS and information on nuedexta can be sent via mail certified/signature mail. Rn stated medical records will process it. Rn verify pts address at [place] [address]. The daughter verbalized understanding address and the need for the PA.

## 2018-08-11 NOTE — Telephone Encounter (Signed)
Spoke to Patient referral sent

## 2018-08-12 ENCOUNTER — Telehealth: Payer: Self-pay

## 2018-08-12 NOTE — Telephone Encounter (Signed)
Rn call patients pharmacy [facility] about Nuedexta needing PA. Rn stated we are unable to do it on cover my meds at this time.The pharmacist stated the PA printout states it has to be done via third party with patients insurance. Fax will be sent to [phone] for RN to complete on line,phone or paper.

## 2018-08-12 NOTE — Telephone Encounter (Signed)
Rn call [employer] at  [phone] to do PA. 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 Pts ID number is [id number]. THis ID was needed for the PA. Its not listed on any of her insurance cards on file.   Medication was approve from 08/12/2018 to 08/05/2019. PA reference number is [id number]

## 2018-08-12 NOTE — Telephone Encounter (Signed)
Rn call [facility] about if they receive the approval PA on nuedexta. [place] tech stated its not showing an approval or denial. She advise to call back this pm when the pharmacist was available.

## 2018-08-13 NOTE — Telephone Encounter (Signed)
Revised. 

## 2018-08-13 NOTE — Telephone Encounter (Signed)
Left vm for Aggie Moats another reimbursement rep for nuedexta to call back about pts high copayment.

## 2018-08-13 NOTE — Telephone Encounter (Addendum)
I call patients daughter [name]  that her moms medication was approve but she will have to pay $689.43. I stated to hr daughter that  its because has not met her deductible per the pharmacy. I explain to [name] that two phone calls have been made to the drug rep about some other resources or programs. I told daughter she will be given a call back when more information is obtained. Lari verbalized understanding.

## 2018-08-13 NOTE — Telephone Encounter (Signed)
Left vm for Amy Antini at [phone] about pt having a high co payment for nuedexta. Need to know what other resources does she have with the medication.

## 2018-08-13 NOTE — Telephone Encounter (Signed)
I receive call from [place] who is the rep for Nuedexta. Amy stated her district has change and [place] in [place]. She is the drug rep on the [place]. They are currently hiring a drug rep for the [place] area. She will help me and guide me of resources. Rn explain pt has a high co payment. Amy stated she will send a form for patient assistance for pt. Once its done please fax to Clarene Critchley who does the reimbursement and coverage for nuedexta.

## 2018-08-13 NOTE — Telephone Encounter (Signed)
I call patients pharmacy to find out how much her co payment was for the nuedexta. They stated due to pt having to meet a deductible it will be $689.43 to obtained the medication. They dont know how much she will pay if she pays the above fee. Rn will call drug rep to see if she has any other resources.

## 2018-08-13 NOTE — Telephone Encounter (Signed)
I call patients daughter that per drug rep for the medication. I explain to daughter it was recommend a patient enrollment form be filled out, and fax. I also stated the pt has to sign the form before it fax. The daughter stated her mom has an appt on August 20, 2018 at 1130 with the physical med rehab MD. They will come on that day to sign form.

## 2018-08-16 LAB — CUP PACEART REMOTE DEVICE CHECK
Date Time Interrogation Session: 20191122233544
MDC IDC PG IMPLANT DT: 20190306

## 2018-08-20 ENCOUNTER — Encounter: Payer: Medicare Other | Attending: Physical Medicine & Rehabilitation | Admitting: Physical Medicine & Rehabilitation

## 2018-08-20 ENCOUNTER — Encounter: Payer: Self-pay | Admitting: Physical Medicine & Rehabilitation

## 2018-08-20 VITALS — BP 128/82 | HR 84 | Ht 67.0 in | Wt 144.4 lb

## 2018-08-20 DIAGNOSIS — M792 Neuralgia and neuritis, unspecified: Secondary | ICD-10-CM

## 2018-08-20 DIAGNOSIS — R209 Unspecified disturbances of skin sensation: Secondary | ICD-10-CM | POA: Diagnosis not present

## 2018-08-20 DIAGNOSIS — I6389 Other cerebral infarction: Secondary | ICD-10-CM | POA: Diagnosis present

## 2018-08-20 DIAGNOSIS — G8929 Other chronic pain: Secondary | ICD-10-CM | POA: Diagnosis not present

## 2018-08-20 DIAGNOSIS — S92901D Unspecified fracture of right foot, subsequent encounter for fracture with routine healing: Secondary | ICD-10-CM | POA: Diagnosis not present

## 2018-08-20 DIAGNOSIS — F17210 Nicotine dependence, cigarettes, uncomplicated: Secondary | ICD-10-CM | POA: Insufficient documentation

## 2018-08-20 DIAGNOSIS — G479 Sleep disorder, unspecified: Secondary | ICD-10-CM | POA: Diagnosis not present

## 2018-08-20 DIAGNOSIS — I69398 Other sequelae of cerebral infarction: Secondary | ICD-10-CM

## 2018-08-20 DIAGNOSIS — I10 Essential (primary) hypertension: Secondary | ICD-10-CM | POA: Insufficient documentation

## 2018-08-20 DIAGNOSIS — I69319 Unspecified symptoms and signs involving cognitive functions following cerebral infarction: Secondary | ICD-10-CM | POA: Diagnosis not present

## 2018-08-20 DIAGNOSIS — I69359 Hemiplegia and hemiparesis following cerebral infarction affecting unspecified side: Secondary | ICD-10-CM | POA: Diagnosis not present

## 2018-08-20 DIAGNOSIS — S43002D Unspecified subluxation of left shoulder joint, subsequent encounter: Secondary | ICD-10-CM | POA: Diagnosis not present

## 2018-08-20 DIAGNOSIS — F4321 Adjustment disorder with depressed mood: Secondary | ICD-10-CM

## 2018-08-20 DIAGNOSIS — R269 Unspecified abnormalities of gait and mobility: Secondary | ICD-10-CM | POA: Diagnosis not present

## 2018-08-20 DIAGNOSIS — M25512 Pain in left shoulder: Secondary | ICD-10-CM

## 2018-08-20 DIAGNOSIS — G8110 Spastic hemiplegia affecting unspecified side: Secondary | ICD-10-CM | POA: Diagnosis not present

## 2018-08-20 MED ORDER — GABAPENTIN 300 MG PO CAPS: 300.0000 mg | ORAL_CAPSULE | Freq: Every day | ORAL | 1 refills | Status: DC

## 2018-08-20 NOTE — Progress Notes (Signed)
Subjective:    Patient ID: Kelly Lowe, female    DOB: 1951-03-04, 68 y.o.   MRN: [id number]  HPI  68 y.o. female with history of HTN, right foot fractures presents for follow for right basal ganglia infarct.   Last clinic visit 07/01/18.  Pt received Dysport injection at that time.  Daughter supplements history.  Since that time, pt states she does not feel that she has significant decrease in tone. She saw Neurology and was referred to Ortho next month. She stopped amantadine, did not notice a difference. Neurology recommended Nudexta. She did not notice benefit with Gabapentin. Denies falls. Sleep has improved. She stopped seeing Psychology because she states no benefit. She is now at home again.    Pain Inventory Average Pain 4 Pain Right Now 4 My pain is intermittent and dull  In the last 24 hours, has pain interfered with the following? General activity 4 Relation with others 4 Enjoyment of life 4 What TIME of day is your pain at its worst? morning Sleep (in general) Good  Pain is worse with: bending and some activites Pain improves with: heat/ice, therapy/exercise and pacing activities Relief from Meds: n/a  Mobility walk with assistance use a cane how many minutes can you walk? 10-15 ability to climb steps?  yes do you drive?  no Do you have any goals in this area?  yes  Function retired I need assistance with the following:  dressing, bathing, meal prep, household duties and shopping  Neuro/Psych tingling trouble walking spasms dizziness anxiety  Prior Studies Any changes since last visit?  no  Physicians involved in your care Any changes since last visit?  no   Family History  Problem Relation Age of Onset  . Stroke Maternal Grandmother   . Stroke Paternal Grandfather    Social History   Socioeconomic History  . Marital status: Divorced    Spouse name: Not on file  . Number of children: Not on file  . Years of education: Not on file  .  Highest education level: Not on file  Occupational History  . Not on file  Social Needs  . Financial resource strain: Not on file  . Food insecurity:    Worry: Not on file    Inability: Not on file  . Transportation needs:    Medical: Not on file    Non-medical: Not on file  Tobacco Use  . Smoking status: Former Smoker    Packs/day: 0.50    Years: 25.00    Pack years: 12.50    Types: Cigarettes    Last attempt to quit: 05/02/2017    Years since quitting: 1.3  . Smokeless tobacco: Never Used  Substance and Sexual Activity  . Alcohol use: No    Alcohol/week: 1.0 standard drinks    Types: 1 Glasses of wine per week    Frequency: Never    Comment: social  . Drug use: No  . Sexual activity: Never  Lifestyle  . Physical activity:    Days per week: Not on file    Minutes per session: Not on file  . Stress: Not on file  Relationships  . Social connections:    Talks on phone: Not on file    Gets together: Not on file    Attends religious service: Not on file    Active member of club or organization: Not on file    Attends meetings of clubs or organizations: Not on file    Relationship status:  Not on file  Other Topics Concern  . Not on file  Social History Narrative  . Not on file   Past Surgical History:  Procedure Laterality Date  . FOOT SURGERY    . LOOP RECORDER INSERTION N/A 10/08/2017   Procedure: LOOP RECORDER INSERTION;  Surgeon: Deboraha Sprang, MD;  Location: [facility];  Service: Cardiovascular;  Laterality: N/A;  . TEE WITHOUT CARDIOVERSION N/A 05/08/2017   Procedure: TRANSESOPHAGEAL ECHOCARDIOGRAM (TEE);  Surgeon: Lelon Perla, MD;  Location: [facility] ENDOSCOPY;  Service: Cardiovascular;  Laterality: N/A;   Past Medical History:  Diagnosis Date  . Hyperlipidemia   . Hypertension   . Stroke (HCC)    BP 128/82   Pulse 84   Ht 5\' 7"  (1.702 m)   Wt 144 lb 6.4 oz (65.5 kg)   SpO2 94%   BMI 22.62 kg/m   Opioid Risk Score:   Fall Risk Score:   `1  Depression screen PHQ 2/9  Depression screen [facility] 2/9 04/09/2018 01/09/2018 11/28/2017 11/13/2017 06/19/2017 06/19/2017  Decreased Interest 0 0 1 0 0 0  Down, Depressed, Hopeless 0 0 1 2 0 0  PHQ - 2 Score 0 0 2 2 0 0  Altered sleeping - - 1 1 3  -  Tired, decreased energy - - 1 0 2 -  Change in appetite - - 1 0 1 -  Feeling bad or failure about yourself  - - 1 1 1  -  Trouble concentrating - - 1 0 1 -  Moving slowly or fidgety/restless - - 1 1 1  -  Suicidal thoughts - - 0 0 0 -  PHQ-9 Score - - 8 5 9  -  Difficult doing work/chores - - Somewhat difficult Somewhat difficult Not difficult at all -    Review of Systems  HENT: Negative.   Eyes: Negative.   Respiratory: Negative.   Cardiovascular: Negative.   Gastrointestinal: Positive for constipation.       Poor appetite  Endocrine: Negative.   Genitourinary: Negative.   Musculoskeletal: Positive for arthralgias.       Spasms  Skin: Positive for rash.  Allergic/Immunologic: Negative.   Neurological: Positive for weakness.       Tingling   Hematological: Negative.   Psychiatric/Behavioral: The patient is nervous/anxious.        Mother passed away and is very emotional  All other systems reviewed and are negative.     Objective:   Physical ExamConstitutional: She appears well-developed and well-nourished. NAD. HENT: Normocephalic and atraumatic.  Eyes: EOMI. No discharge.  Cardiovascular: RRR. No  JVD. Respiratory: Unlabored. Clear. GI: She exhibits no distension. BS+ Musculoskeletal: Shoulder subluxation ~1 finger LUE, slightly improved.  Neurological:  Motor: LUE: 2+/5 shoulder abduction, elbow flex 2+/5, elbow ext 2/5, hand grip 2/5  LLE: 4-/5 HF, 4-/5 KE, ADF/PF 2/5  mAS left elbow flexors 1+/4, wrist flexors 1/4, finger flexors 1/4 Left ADF/PF 1+/4 Follows commands.  Insight and awareness into deficits.  Skin: Skin is warm. Intact. Psychiatric: Normal mood and behavior.     Assessment & Plan:  68 y.o. female  with history of HTN, right foot fractures presents for follow for right basal ganglia infarct.   1.  Left spastic hemiparesis, fatigue, and functional deficits secondary to right basal ganglia infarct  Cont HEP  No benefit with amantadine  Cont WHO/PRAFO  Will hold off on Dysport injections at this time to evaluate for functional efficacy.   Tiral increase Gabapentin to 300qhs for numbness  2. Gait abnormality  Cont cane for safety  3. Sleep disturbance  Improved  4. Mood lability:  Patient did not find benefit with Psychology  Cont Cymbalta to 60mg  daily  Nudexta ordered by Neurology  5. Left shoulder subluxation with pain  Kinesio taping on hold due to reaction to tape  SPRINT placed on 9/5, removed on 11/6  Tramadol per PCP (educated on signs/symptoms serotonin syndrome)  Ortho consult ordered by Neurology NP  6. Bony prominence right wrist  Xray with bony prominence  Voltaren gel in effective  Good benefits with injection on 9/26  7. Right hand numbness  Managed at present  Positional  >40 minutes spent with patient and daughter, with >35 minutes spent in counseling regarding deficits, interventions, and prognosis

## 2018-08-20 NOTE — Telephone Encounter (Signed)
I fax the [place] application for assistance with co payment to [phone] along with application, office notes, demographic sheet, and medication list. I receive fax that application has been receive and pending.

## 2018-08-21 NOTE — Progress Notes (Signed)
I agree with the above plan 

## 2018-08-25 ENCOUNTER — Encounter (INDEPENDENT_AMBULATORY_CARE_PROVIDER_SITE_OTHER): Payer: Self-pay | Admitting: Orthopaedic Surgery

## 2018-08-25 ENCOUNTER — Ambulatory Visit (INDEPENDENT_AMBULATORY_CARE_PROVIDER_SITE_OTHER): Payer: Medicare Other

## 2018-08-25 ENCOUNTER — Other Ambulatory Visit (INDEPENDENT_AMBULATORY_CARE_PROVIDER_SITE_OTHER): Payer: Self-pay | Admitting: *Deleted

## 2018-08-25 ENCOUNTER — Ambulatory Visit (INDEPENDENT_AMBULATORY_CARE_PROVIDER_SITE_OTHER): Payer: Medicare Other | Admitting: Orthopaedic Surgery

## 2018-08-25 ENCOUNTER — Telehealth: Payer: Self-pay | Admitting: Adult Health

## 2018-08-25 VITALS — BP 125/81 | HR 79

## 2018-08-25 DIAGNOSIS — M25512 Pain in left shoulder: Secondary | ICD-10-CM

## 2018-08-25 DIAGNOSIS — G8929 Other chronic pain: Secondary | ICD-10-CM

## 2018-08-25 NOTE — Telephone Encounter (Signed)
PA has already been done by [place], Therapist, sports

## 2018-08-25 NOTE — Progress Notes (Signed)
Office Visit Note   Patient: Kelly Lowe           Date of Birth: April 22, 1951           MRN: [id number] Visit Date: 08/25/2018              Requested by: Venancio Poisson, NP [address] [place], [place] [address] PCP: Carol Ada, MD   Assessment & Plan: Visit Diagnoses:  1. Left shoulder pain, unspecified chronicity     Plan: Status post CVA affecting left upper and lower extremity with with residual slight inferior subluxation left shoulder creating pain.  There is some element of adhesive capsulitis.  Long discussion with Kelly Lowe and her daughter.  We will ask for an in-home physical therapy evaluation.  She has all the appropriate devices with slings ,braces ,TENS unit and electrical stim.  Office visit over 45 minutes 50% of the time in counseling.  At some point we might consider cortisone injection  Follow-Up Instructions: Return if symptoms worsen or fail to improve.   Orders:  Orders Placed This Encounter  Procedures  . XR Shoulder Left   No orders of the defined types were placed in this encounter.     Procedures: No procedures performed   Clinical Data: No additional findings.   Subjective: Chief Complaint  Patient presents with  . Shoulder Pain    painful and out of socket atfter having stroke in sept 2018--painful, worse when hanging down.   Kelly Lowe is 68 years old and accompanied by her daughter and here for evaluation of chronic left shoulder pain.  She sustained a CVA over a year ago affecting the left upper and left lower extremity.  She has been through a course of physical therapy but has not had any significant functional return in well over 6 months.  She does have chronic shoulder pain.  She has a shoulder brace at home, a sling, a TENS unit and even electrical stimulation device.  Medicare has limited her physical therapy.  She presently is at home living with 1 of her daughters trying to maintain an independent existence.   She does walk with the use of a cane.  Her biggest problem is chronic shoulder pain  HPI  Review of Systems  Constitutional: Negative.   HENT: Negative.   Eyes: Negative.   Respiratory: Negative.   Cardiovascular: Negative.   Gastrointestinal: Negative.   Endocrine: Negative.   Musculoskeletal: Positive for gait problem, neck pain and neck stiffness.  Skin: Negative.   Allergic/Immunologic: Negative.   Hematological: Negative.   Psychiatric/Behavioral: Negative.      Objective: Vital Signs: BP 125/81   Pulse 79   Physical Exam Constitutional:      Appearance: She is well-developed.  Pulmonary:     Effort: Pulmonary effort is normal.  Skin:    General: Skin is warm and dry.  Neurological:     Mental Status: She is alert and oriented to person, place, and time.  Psychiatric:        Behavior: Behavior normal.     Ortho Exam speech is appropriate.  No facial distortion.  Has significant weakness of both upper and lower extremities with  little functional use..  Unable to raise her arm.  Skin intact.  Cannot grip.  Abduct her left shoulder about 95 degrees and flex about 130 degrees.  She seemed a little tight beyond that point and probably has adhesive capsulitis.  Specialty Comments:  No specialty comments available.  Imaging: Xr Shoulder Left  Result Date: 08/25/2018 Films of the left shoulder taken several projections.  The head is located there is there is slight inferior position of the head relationship to the glenoid consistent with her stroke.  Diffuse bony demineralization.  No ectopic calcification or acute changes    PMFS History: Patient Active Problem List   Diagnosis Date Noted  . Adjustment disorder with depressed mood   . Spastic hemiparesis ([place])   . Constipation   . Chronic bilateral low back pain without sciatica   . Neurogenic bladder   . Fracture of metatarsal bone of left foot   . Hemiparesis of left nondominant side ([place]) 05/13/2017  .  Cognitive deficits following cerebral infarction 05/13/2017  . Sleep disturbance   . Closed fracture of bone of right foot   . Benign essential HTN   . Slow transit constipation   . Embolic stroke of right basal ganglia ([place]) 05/06/2017  . Tobacco use 05/05/2017  . Cerebral infarction ([place])   . Pain   . Hypokalemia   . Acute blood loss anemia   . CVA (cerebral vascular accident) ([place]) 05/03/2017   Past Medical History:  Diagnosis Date  . Hyperlipidemia   . Hypertension   . Stroke [facility])     Family History  Problem Relation Age of Onset  . Stroke Maternal Grandmother   . Stroke Paternal Grandfather     Past Surgical History:  Procedure Laterality Date  . FOOT SURGERY    . LOOP RECORDER INSERTION N/A 10/08/2017   Procedure: LOOP RECORDER INSERTION;  Surgeon: Deboraha Sprang, MD;  Location: [facility];  Service: Cardiovascular;  Laterality: N/A;  . TEE WITHOUT CARDIOVERSION N/A 05/08/2017   Procedure: TRANSESOPHAGEAL ECHOCARDIOGRAM (TEE);  Surgeon: Lelon Perla, MD;  Location[facility];  Service: Cardiovascular;  Laterality: N/A;   Social History   Occupational History  . Not on file  Tobacco Use  . Smoking status: Former Smoker    Packs/day: 0.50    Years: 25.00    Pack years: 12.50    Types: Cigarettes    Last attempt to quit: 05/02/2017    Years since quitting: 1.3  . Smokeless tobacco: Never Used  Substance and Sexual Activity  . Alcohol use: No    Alcohol/week: 1.0 standard drinks    Types: 1 Glasses of wine per week    Frequency: Never    Comment: social  . Drug use: No  . Sexual activity: Never

## 2018-08-25 NOTE — Telephone Encounter (Signed)
[facility] is needing a PA on a medication for this pt. Please advise.

## 2018-08-25 NOTE — Addendum Note (Signed)
Addended by: Elta Guadeloupe on: 08/25/2018 03:36 PM   Modules accepted: Orders

## 2018-08-27 ENCOUNTER — Telehealth (INDEPENDENT_AMBULATORY_CARE_PROVIDER_SITE_OTHER): Payer: Self-pay | Admitting: Orthopaedic Surgery

## 2018-08-27 NOTE — Telephone Encounter (Signed)
I called patients daughter [name] to give her number for co payment assistant recommend by nuedexta. The daughter was given number of [phone] to press option 1. The daughter verbalized understanding.

## 2018-08-27 NOTE — Telephone Encounter (Signed)
Per Darlina Guys patients POA is requesting that [facility] does not  start PT until Feb 1st.

## 2018-08-27 NOTE — Telephone Encounter (Signed)
ok 

## 2018-08-27 NOTE — Telephone Encounter (Signed)
I called Nuedexta toll free number at [phone] about the application process on co payment assistant for her. They stated pt will need to call [phone] for co pay,emt  assistance for medicare patients.THe pt will have to press option one. RN will call pts daughter.

## 2018-08-28 NOTE — Telephone Encounter (Signed)
Returned the call to [facility] and spoke with Judeen Hammans, advised her Aaron Edelman is aware and stated ok.

## 2018-08-31 ENCOUNTER — Ambulatory Visit (INDEPENDENT_AMBULATORY_CARE_PROVIDER_SITE_OTHER): Payer: Medicare Other

## 2018-08-31 DIAGNOSIS — I6300 Cerebral infarction due to thrombosis of unspecified precerebral artery: Secondary | ICD-10-CM

## 2018-09-01 LAB — CUP PACEART REMOTE DEVICE CHECK
Date Time Interrogation Session: 20200128010953
Implantable Pulse Generator Implant Date: 20190306

## 2018-09-01 NOTE — Progress Notes (Signed)
[facility] Summary Report / Loop Recorder 

## 2018-09-05 DIAGNOSIS — I69354 Hemiplegia and hemiparesis following cerebral infarction affecting left non-dominant side: Secondary | ICD-10-CM | POA: Diagnosis not present

## 2018-09-05 DIAGNOSIS — M21372 Foot drop, left foot: Secondary | ICD-10-CM | POA: Diagnosis not present

## 2018-09-05 DIAGNOSIS — N319 Neuromuscular dysfunction of bladder, unspecified: Secondary | ICD-10-CM | POA: Diagnosis not present

## 2018-09-05 DIAGNOSIS — I69318 Other symptoms and signs involving cognitive functions following cerebral infarction: Secondary | ICD-10-CM | POA: Diagnosis not present

## 2018-09-05 DIAGNOSIS — M7502 Adhesive capsulitis of left shoulder: Secondary | ICD-10-CM | POA: Diagnosis not present

## 2018-09-05 DIAGNOSIS — F4321 Adjustment disorder with depressed mood: Secondary | ICD-10-CM | POA: Diagnosis not present

## 2018-09-05 DIAGNOSIS — Z9181 History of falling: Secondary | ICD-10-CM | POA: Diagnosis not present

## 2018-09-05 DIAGNOSIS — S43082D Other subluxation of left shoulder joint, subsequent encounter: Secondary | ICD-10-CM | POA: Diagnosis not present

## 2018-09-05 DIAGNOSIS — I10 Essential (primary) hypertension: Secondary | ICD-10-CM | POA: Diagnosis not present

## 2018-09-05 DIAGNOSIS — Z87891 Personal history of nicotine dependence: Secondary | ICD-10-CM | POA: Diagnosis not present

## 2018-09-05 DIAGNOSIS — G8929 Other chronic pain: Secondary | ICD-10-CM | POA: Diagnosis not present

## 2018-09-07 ENCOUNTER — Telehealth (INDEPENDENT_AMBULATORY_CARE_PROVIDER_SITE_OTHER): Payer: Self-pay | Admitting: Orthopaedic Surgery

## 2018-09-07 NOTE — Telephone Encounter (Signed)
Please advise 

## 2018-09-07 NOTE — Telephone Encounter (Signed)
ok 

## 2018-09-07 NOTE — Telephone Encounter (Signed)
Tiffany from [facility] called requesting verbal orders for physical therapy 1 time/week for 1 week, 2 times/week for 4 weeks.  Please call back at [phone]

## 2018-09-08 NOTE — Telephone Encounter (Signed)
Called and spoke with Tiffany. I let her know that Dr.Whitfield agrees with her plan of care.

## 2018-09-08 NOTE — Telephone Encounter (Signed)
Please call [facility]:

## 2018-09-16 DIAGNOSIS — S43082D Other subluxation of left shoulder joint, subsequent encounter: Secondary | ICD-10-CM | POA: Diagnosis not present

## 2018-09-16 DIAGNOSIS — I10 Essential (primary) hypertension: Secondary | ICD-10-CM | POA: Diagnosis not present

## 2018-09-16 DIAGNOSIS — G8929 Other chronic pain: Secondary | ICD-10-CM | POA: Diagnosis not present

## 2018-09-16 DIAGNOSIS — I69354 Hemiplegia and hemiparesis following cerebral infarction affecting left non-dominant side: Secondary | ICD-10-CM | POA: Diagnosis not present

## 2018-09-16 DIAGNOSIS — I69318 Other symptoms and signs involving cognitive functions following cerebral infarction: Secondary | ICD-10-CM | POA: Diagnosis not present

## 2018-09-16 DIAGNOSIS — M7502 Adhesive capsulitis of left shoulder: Secondary | ICD-10-CM | POA: Diagnosis not present

## 2018-09-16 NOTE — Telephone Encounter (Signed)
If daughter calls back please advise her to r/s moms appt. THe appt is for a stroke follow up. Janett Billow NP is aware of the co payment and pt trying to get pt assistance with nuedexta.  Left vm for patients daughter [name] that a number was given to her for patient assistance. VM was left that pt needs to still follow up even if she has not started medication.

## 2018-09-16 NOTE — Telephone Encounter (Signed)
Pt's daughter Lari/DPR advised the pt did not qualify for PAP for the medication, it would cost her $600/mth. She requested the appt on 3/12 be c/a since patient is unable to start this medication.  FYI

## 2018-09-18 DIAGNOSIS — I10 Essential (primary) hypertension: Secondary | ICD-10-CM | POA: Diagnosis not present

## 2018-09-18 DIAGNOSIS — M7502 Adhesive capsulitis of left shoulder: Secondary | ICD-10-CM | POA: Diagnosis not present

## 2018-09-18 DIAGNOSIS — I69354 Hemiplegia and hemiparesis following cerebral infarction affecting left non-dominant side: Secondary | ICD-10-CM | POA: Diagnosis not present

## 2018-09-18 DIAGNOSIS — S43082D Other subluxation of left shoulder joint, subsequent encounter: Secondary | ICD-10-CM | POA: Diagnosis not present

## 2018-09-18 DIAGNOSIS — G8929 Other chronic pain: Secondary | ICD-10-CM | POA: Diagnosis not present

## 2018-09-18 DIAGNOSIS — I69318 Other symptoms and signs involving cognitive functions following cerebral infarction: Secondary | ICD-10-CM | POA: Diagnosis not present

## 2018-09-22 DIAGNOSIS — I10 Essential (primary) hypertension: Secondary | ICD-10-CM | POA: Diagnosis not present

## 2018-09-22 DIAGNOSIS — M7502 Adhesive capsulitis of left shoulder: Secondary | ICD-10-CM | POA: Diagnosis not present

## 2018-09-22 DIAGNOSIS — I69318 Other symptoms and signs involving cognitive functions following cerebral infarction: Secondary | ICD-10-CM | POA: Diagnosis not present

## 2018-09-22 DIAGNOSIS — I69354 Hemiplegia and hemiparesis following cerebral infarction affecting left non-dominant side: Secondary | ICD-10-CM | POA: Diagnosis not present

## 2018-09-22 DIAGNOSIS — S43082D Other subluxation of left shoulder joint, subsequent encounter: Secondary | ICD-10-CM | POA: Diagnosis not present

## 2018-09-22 DIAGNOSIS — G8929 Other chronic pain: Secondary | ICD-10-CM | POA: Diagnosis not present

## 2018-09-24 DIAGNOSIS — I10 Essential (primary) hypertension: Secondary | ICD-10-CM | POA: Diagnosis not present

## 2018-09-24 DIAGNOSIS — G8929 Other chronic pain: Secondary | ICD-10-CM | POA: Diagnosis not present

## 2018-09-24 DIAGNOSIS — M7502 Adhesive capsulitis of left shoulder: Secondary | ICD-10-CM | POA: Diagnosis not present

## 2018-09-24 DIAGNOSIS — S43082D Other subluxation of left shoulder joint, subsequent encounter: Secondary | ICD-10-CM | POA: Diagnosis not present

## 2018-09-24 DIAGNOSIS — I69354 Hemiplegia and hemiparesis following cerebral infarction affecting left non-dominant side: Secondary | ICD-10-CM | POA: Diagnosis not present

## 2018-09-24 DIAGNOSIS — I69318 Other symptoms and signs involving cognitive functions following cerebral infarction: Secondary | ICD-10-CM | POA: Diagnosis not present

## 2018-09-26 DIAGNOSIS — H10013 Acute follicular conjunctivitis, bilateral: Secondary | ICD-10-CM | POA: Diagnosis not present

## 2018-09-27 DIAGNOSIS — J111 Influenza due to unidentified influenza virus with other respiratory manifestations: Secondary | ICD-10-CM | POA: Diagnosis not present

## 2018-09-29 DIAGNOSIS — I10 Essential (primary) hypertension: Secondary | ICD-10-CM | POA: Diagnosis not present

## 2018-09-29 DIAGNOSIS — I69354 Hemiplegia and hemiparesis following cerebral infarction affecting left non-dominant side: Secondary | ICD-10-CM | POA: Diagnosis not present

## 2018-09-29 DIAGNOSIS — I69318 Other symptoms and signs involving cognitive functions following cerebral infarction: Secondary | ICD-10-CM | POA: Diagnosis not present

## 2018-09-29 DIAGNOSIS — S43082D Other subluxation of left shoulder joint, subsequent encounter: Secondary | ICD-10-CM | POA: Diagnosis not present

## 2018-09-29 DIAGNOSIS — M7502 Adhesive capsulitis of left shoulder: Secondary | ICD-10-CM | POA: Diagnosis not present

## 2018-09-29 DIAGNOSIS — G8929 Other chronic pain: Secondary | ICD-10-CM | POA: Diagnosis not present

## 2018-10-01 DIAGNOSIS — I69354 Hemiplegia and hemiparesis following cerebral infarction affecting left non-dominant side: Secondary | ICD-10-CM | POA: Diagnosis not present

## 2018-10-01 DIAGNOSIS — S43082D Other subluxation of left shoulder joint, subsequent encounter: Secondary | ICD-10-CM | POA: Diagnosis not present

## 2018-10-01 DIAGNOSIS — I10 Essential (primary) hypertension: Secondary | ICD-10-CM | POA: Diagnosis not present

## 2018-10-01 DIAGNOSIS — I69318 Other symptoms and signs involving cognitive functions following cerebral infarction: Secondary | ICD-10-CM | POA: Diagnosis not present

## 2018-10-01 DIAGNOSIS — G8929 Other chronic pain: Secondary | ICD-10-CM | POA: Diagnosis not present

## 2018-10-01 DIAGNOSIS — M7502 Adhesive capsulitis of left shoulder: Secondary | ICD-10-CM | POA: Diagnosis not present

## 2018-10-02 ENCOUNTER — Telehealth (INDEPENDENT_AMBULATORY_CARE_PROVIDER_SITE_OTHER): Payer: Self-pay | Admitting: Orthopaedic Surgery

## 2018-10-02 NOTE — Telephone Encounter (Signed)
ok 

## 2018-10-02 NOTE — Telephone Encounter (Signed)
Verbal orders given to [place] with [facility].

## 2018-10-02 NOTE — Telephone Encounter (Signed)
[name], physical therapist at [place] at [facility] left a voicemail requesting extension of orders 2 times/week for 3 weeks.  [name] is also requesting a Education officer, museum visit for patient.  [name] did not leave a call back number.

## 2018-10-02 NOTE — Telephone Encounter (Signed)
Please advise 

## 2018-10-04 LAB — CUP PACEART REMOTE DEVICE CHECK
Date Time Interrogation Session: 20200301010624
Implantable Pulse Generator Implant Date: 20190306

## 2018-10-05 ENCOUNTER — Ambulatory Visit (INDEPENDENT_AMBULATORY_CARE_PROVIDER_SITE_OTHER): Payer: Medicare Other | Admitting: *Deleted

## 2018-10-05 DIAGNOSIS — I69318 Other symptoms and signs involving cognitive functions following cerebral infarction: Secondary | ICD-10-CM | POA: Diagnosis not present

## 2018-10-05 DIAGNOSIS — G8929 Other chronic pain: Secondary | ICD-10-CM | POA: Diagnosis not present

## 2018-10-05 DIAGNOSIS — M7502 Adhesive capsulitis of left shoulder: Secondary | ICD-10-CM | POA: Diagnosis not present

## 2018-10-05 DIAGNOSIS — I6300 Cerebral infarction due to thrombosis of unspecified precerebral artery: Secondary | ICD-10-CM | POA: Diagnosis not present

## 2018-10-05 DIAGNOSIS — I10 Essential (primary) hypertension: Secondary | ICD-10-CM | POA: Diagnosis not present

## 2018-10-05 DIAGNOSIS — N319 Neuromuscular dysfunction of bladder, unspecified: Secondary | ICD-10-CM | POA: Diagnosis not present

## 2018-10-05 DIAGNOSIS — Z87891 Personal history of nicotine dependence: Secondary | ICD-10-CM | POA: Diagnosis not present

## 2018-10-05 DIAGNOSIS — M21372 Foot drop, left foot: Secondary | ICD-10-CM | POA: Diagnosis not present

## 2018-10-05 DIAGNOSIS — F4321 Adjustment disorder with depressed mood: Secondary | ICD-10-CM | POA: Diagnosis not present

## 2018-10-05 DIAGNOSIS — I69354 Hemiplegia and hemiparesis following cerebral infarction affecting left non-dominant side: Secondary | ICD-10-CM | POA: Diagnosis not present

## 2018-10-05 DIAGNOSIS — Z9181 History of falling: Secondary | ICD-10-CM | POA: Diagnosis not present

## 2018-10-05 DIAGNOSIS — S43082D Other subluxation of left shoulder joint, subsequent encounter: Secondary | ICD-10-CM | POA: Diagnosis not present

## 2018-10-07 ENCOUNTER — Telehealth (INDEPENDENT_AMBULATORY_CARE_PROVIDER_SITE_OTHER): Payer: Self-pay | Admitting: Orthopaedic Surgery

## 2018-10-07 DIAGNOSIS — S43082D Other subluxation of left shoulder joint, subsequent encounter: Secondary | ICD-10-CM | POA: Diagnosis not present

## 2018-10-07 DIAGNOSIS — I10 Essential (primary) hypertension: Secondary | ICD-10-CM | POA: Diagnosis not present

## 2018-10-07 DIAGNOSIS — I69354 Hemiplegia and hemiparesis following cerebral infarction affecting left non-dominant side: Secondary | ICD-10-CM | POA: Diagnosis not present

## 2018-10-07 DIAGNOSIS — G8929 Other chronic pain: Secondary | ICD-10-CM | POA: Diagnosis not present

## 2018-10-07 DIAGNOSIS — I69318 Other symptoms and signs involving cognitive functions following cerebral infarction: Secondary | ICD-10-CM | POA: Diagnosis not present

## 2018-10-07 DIAGNOSIS — M7502 Adhesive capsulitis of left shoulder: Secondary | ICD-10-CM | POA: Diagnosis not present

## 2018-10-07 NOTE — Telephone Encounter (Signed)
Please advise 

## 2018-10-07 NOTE — Telephone Encounter (Signed)
Ok to write? 

## 2018-10-07 NOTE — Telephone Encounter (Signed)
[name] from [facility] called stating the patient would benefit from OT rehab, as well as a home health aide.  Kelly Lowe is requesting the orders for both be faxed to [phone].  If you have any questions, please call [phone]

## 2018-10-08 NOTE — Telephone Encounter (Signed)
Please write. Thank you.

## 2018-10-09 ENCOUNTER — Other Ambulatory Visit (INDEPENDENT_AMBULATORY_CARE_PROVIDER_SITE_OTHER): Payer: Self-pay | Admitting: Orthopaedic Surgery

## 2018-10-09 DIAGNOSIS — S43082D Other subluxation of left shoulder joint, subsequent encounter: Secondary | ICD-10-CM | POA: Diagnosis not present

## 2018-10-09 DIAGNOSIS — I69354 Hemiplegia and hemiparesis following cerebral infarction affecting left non-dominant side: Secondary | ICD-10-CM | POA: Diagnosis not present

## 2018-10-09 DIAGNOSIS — G8929 Other chronic pain: Secondary | ICD-10-CM

## 2018-10-09 DIAGNOSIS — I69318 Other symptoms and signs involving cognitive functions following cerebral infarction: Secondary | ICD-10-CM | POA: Diagnosis not present

## 2018-10-09 DIAGNOSIS — I10 Essential (primary) hypertension: Secondary | ICD-10-CM | POA: Diagnosis not present

## 2018-10-09 DIAGNOSIS — M7502 Adhesive capsulitis of left shoulder: Secondary | ICD-10-CM | POA: Diagnosis not present

## 2018-10-09 DIAGNOSIS — M25512 Pain in left shoulder: Principal | ICD-10-CM

## 2018-10-09 NOTE — Telephone Encounter (Signed)
Orders faxed to number provided.

## 2018-10-11 ENCOUNTER — Other Ambulatory Visit: Payer: Self-pay | Admitting: Physical Medicine & Rehabilitation

## 2018-10-12 ENCOUNTER — Ambulatory Visit: Payer: Medicare Other | Admitting: Adult Health

## 2018-10-12 DIAGNOSIS — S43082D Other subluxation of left shoulder joint, subsequent encounter: Secondary | ICD-10-CM | POA: Diagnosis not present

## 2018-10-12 DIAGNOSIS — I69318 Other symptoms and signs involving cognitive functions following cerebral infarction: Secondary | ICD-10-CM | POA: Diagnosis not present

## 2018-10-12 DIAGNOSIS — M7502 Adhesive capsulitis of left shoulder: Secondary | ICD-10-CM | POA: Diagnosis not present

## 2018-10-12 DIAGNOSIS — G8929 Other chronic pain: Secondary | ICD-10-CM | POA: Diagnosis not present

## 2018-10-12 DIAGNOSIS — I10 Essential (primary) hypertension: Secondary | ICD-10-CM | POA: Diagnosis not present

## 2018-10-12 DIAGNOSIS — I69354 Hemiplegia and hemiparesis following cerebral infarction affecting left non-dominant side: Secondary | ICD-10-CM | POA: Diagnosis not present

## 2018-10-13 DIAGNOSIS — S43082D Other subluxation of left shoulder joint, subsequent encounter: Secondary | ICD-10-CM | POA: Diagnosis not present

## 2018-10-13 DIAGNOSIS — I69354 Hemiplegia and hemiparesis following cerebral infarction affecting left non-dominant side: Secondary | ICD-10-CM | POA: Diagnosis not present

## 2018-10-13 DIAGNOSIS — M7502 Adhesive capsulitis of left shoulder: Secondary | ICD-10-CM | POA: Diagnosis not present

## 2018-10-13 DIAGNOSIS — I69318 Other symptoms and signs involving cognitive functions following cerebral infarction: Secondary | ICD-10-CM | POA: Diagnosis not present

## 2018-10-13 DIAGNOSIS — I10 Essential (primary) hypertension: Secondary | ICD-10-CM | POA: Diagnosis not present

## 2018-10-13 DIAGNOSIS — G8929 Other chronic pain: Secondary | ICD-10-CM | POA: Diagnosis not present

## 2018-10-13 NOTE — Progress Notes (Signed)
[facility] Summary Report / Loop Recorder 

## 2018-10-14 ENCOUNTER — Telehealth (INDEPENDENT_AMBULATORY_CARE_PROVIDER_SITE_OTHER): Payer: Self-pay | Admitting: Orthopaedic Surgery

## 2018-10-14 NOTE — Telephone Encounter (Signed)
Izora Gala, OT with [facility] at [facility] left a voicemail requesting verbal orders for home health OT for patient.  2 times/week for 3 weeks, 1 time/week for 1 week for ADL training.  Please call back at [phone]

## 2018-10-15 ENCOUNTER — Encounter: Payer: Medicare Other | Admitting: Physical Medicine & Rehabilitation

## 2018-10-15 ENCOUNTER — Ambulatory Visit: Payer: Medicare Other | Admitting: Adult Health

## 2018-10-15 DIAGNOSIS — S43082D Other subluxation of left shoulder joint, subsequent encounter: Secondary | ICD-10-CM | POA: Diagnosis not present

## 2018-10-15 DIAGNOSIS — M7502 Adhesive capsulitis of left shoulder: Secondary | ICD-10-CM | POA: Diagnosis not present

## 2018-10-15 DIAGNOSIS — I69318 Other symptoms and signs involving cognitive functions following cerebral infarction: Secondary | ICD-10-CM | POA: Diagnosis not present

## 2018-10-15 DIAGNOSIS — I69354 Hemiplegia and hemiparesis following cerebral infarction affecting left non-dominant side: Secondary | ICD-10-CM | POA: Diagnosis not present

## 2018-10-15 DIAGNOSIS — G8929 Other chronic pain: Secondary | ICD-10-CM | POA: Diagnosis not present

## 2018-10-15 DIAGNOSIS — I10 Essential (primary) hypertension: Secondary | ICD-10-CM | POA: Diagnosis not present

## 2018-10-16 DIAGNOSIS — S43082D Other subluxation of left shoulder joint, subsequent encounter: Secondary | ICD-10-CM | POA: Diagnosis not present

## 2018-10-16 DIAGNOSIS — M7502 Adhesive capsulitis of left shoulder: Secondary | ICD-10-CM | POA: Diagnosis not present

## 2018-10-16 DIAGNOSIS — G8929 Other chronic pain: Secondary | ICD-10-CM | POA: Diagnosis not present

## 2018-10-16 DIAGNOSIS — I69354 Hemiplegia and hemiparesis following cerebral infarction affecting left non-dominant side: Secondary | ICD-10-CM | POA: Diagnosis not present

## 2018-10-16 DIAGNOSIS — I10 Essential (primary) hypertension: Secondary | ICD-10-CM | POA: Diagnosis not present

## 2018-10-16 DIAGNOSIS — I69318 Other symptoms and signs involving cognitive functions following cerebral infarction: Secondary | ICD-10-CM | POA: Diagnosis not present

## 2018-10-19 DIAGNOSIS — I69354 Hemiplegia and hemiparesis following cerebral infarction affecting left non-dominant side: Secondary | ICD-10-CM | POA: Diagnosis not present

## 2018-10-19 DIAGNOSIS — I10 Essential (primary) hypertension: Secondary | ICD-10-CM | POA: Diagnosis not present

## 2018-10-19 DIAGNOSIS — S43082D Other subluxation of left shoulder joint, subsequent encounter: Secondary | ICD-10-CM | POA: Diagnosis not present

## 2018-10-19 DIAGNOSIS — M7502 Adhesive capsulitis of left shoulder: Secondary | ICD-10-CM | POA: Diagnosis not present

## 2018-10-19 DIAGNOSIS — I69318 Other symptoms and signs involving cognitive functions following cerebral infarction: Secondary | ICD-10-CM | POA: Diagnosis not present

## 2018-10-19 DIAGNOSIS — G8929 Other chronic pain: Secondary | ICD-10-CM | POA: Diagnosis not present

## 2018-10-20 DIAGNOSIS — G8929 Other chronic pain: Secondary | ICD-10-CM | POA: Diagnosis not present

## 2018-10-20 DIAGNOSIS — I69318 Other symptoms and signs involving cognitive functions following cerebral infarction: Secondary | ICD-10-CM | POA: Diagnosis not present

## 2018-10-20 DIAGNOSIS — M7502 Adhesive capsulitis of left shoulder: Secondary | ICD-10-CM | POA: Diagnosis not present

## 2018-10-20 DIAGNOSIS — I10 Essential (primary) hypertension: Secondary | ICD-10-CM | POA: Diagnosis not present

## 2018-10-20 DIAGNOSIS — S43082D Other subluxation of left shoulder joint, subsequent encounter: Secondary | ICD-10-CM | POA: Diagnosis not present

## 2018-10-20 DIAGNOSIS — I69354 Hemiplegia and hemiparesis following cerebral infarction affecting left non-dominant side: Secondary | ICD-10-CM | POA: Diagnosis not present

## 2018-10-22 DIAGNOSIS — S43082D Other subluxation of left shoulder joint, subsequent encounter: Secondary | ICD-10-CM | POA: Diagnosis not present

## 2018-10-22 DIAGNOSIS — I10 Essential (primary) hypertension: Secondary | ICD-10-CM | POA: Diagnosis not present

## 2018-10-22 DIAGNOSIS — I69318 Other symptoms and signs involving cognitive functions following cerebral infarction: Secondary | ICD-10-CM | POA: Diagnosis not present

## 2018-10-22 DIAGNOSIS — G8929 Other chronic pain: Secondary | ICD-10-CM | POA: Diagnosis not present

## 2018-10-22 DIAGNOSIS — M7502 Adhesive capsulitis of left shoulder: Secondary | ICD-10-CM | POA: Diagnosis not present

## 2018-10-22 DIAGNOSIS — I69354 Hemiplegia and hemiparesis following cerebral infarction affecting left non-dominant side: Secondary | ICD-10-CM | POA: Diagnosis not present

## 2018-10-24 ENCOUNTER — Other Ambulatory Visit: Payer: Self-pay | Admitting: Physical Medicine & Rehabilitation

## 2018-10-26 DIAGNOSIS — I69354 Hemiplegia and hemiparesis following cerebral infarction affecting left non-dominant side: Secondary | ICD-10-CM | POA: Diagnosis not present

## 2018-10-26 DIAGNOSIS — G8929 Other chronic pain: Secondary | ICD-10-CM | POA: Diagnosis not present

## 2018-10-26 DIAGNOSIS — M7502 Adhesive capsulitis of left shoulder: Secondary | ICD-10-CM | POA: Diagnosis not present

## 2018-10-26 DIAGNOSIS — I69318 Other symptoms and signs involving cognitive functions following cerebral infarction: Secondary | ICD-10-CM | POA: Diagnosis not present

## 2018-10-26 DIAGNOSIS — S43082D Other subluxation of left shoulder joint, subsequent encounter: Secondary | ICD-10-CM | POA: Diagnosis not present

## 2018-10-26 DIAGNOSIS — I10 Essential (primary) hypertension: Secondary | ICD-10-CM | POA: Diagnosis not present

## 2018-10-29 DIAGNOSIS — I10 Essential (primary) hypertension: Secondary | ICD-10-CM | POA: Diagnosis not present

## 2018-10-29 DIAGNOSIS — G8929 Other chronic pain: Secondary | ICD-10-CM | POA: Diagnosis not present

## 2018-10-29 DIAGNOSIS — I69354 Hemiplegia and hemiparesis following cerebral infarction affecting left non-dominant side: Secondary | ICD-10-CM | POA: Diagnosis not present

## 2018-10-29 DIAGNOSIS — M7502 Adhesive capsulitis of left shoulder: Secondary | ICD-10-CM | POA: Diagnosis not present

## 2018-10-29 DIAGNOSIS — S43082D Other subluxation of left shoulder joint, subsequent encounter: Secondary | ICD-10-CM | POA: Diagnosis not present

## 2018-10-29 DIAGNOSIS — I69318 Other symptoms and signs involving cognitive functions following cerebral infarction: Secondary | ICD-10-CM | POA: Diagnosis not present

## 2018-11-02 ENCOUNTER — Telehealth (INDEPENDENT_AMBULATORY_CARE_PROVIDER_SITE_OTHER): Payer: Self-pay | Admitting: Orthopaedic Surgery

## 2018-11-02 DIAGNOSIS — G8929 Other chronic pain: Secondary | ICD-10-CM | POA: Diagnosis not present

## 2018-11-02 DIAGNOSIS — I10 Essential (primary) hypertension: Secondary | ICD-10-CM | POA: Diagnosis not present

## 2018-11-02 DIAGNOSIS — I69354 Hemiplegia and hemiparesis following cerebral infarction affecting left non-dominant side: Secondary | ICD-10-CM | POA: Diagnosis not present

## 2018-11-02 DIAGNOSIS — I69318 Other symptoms and signs involving cognitive functions following cerebral infarction: Secondary | ICD-10-CM | POA: Diagnosis not present

## 2018-11-02 DIAGNOSIS — S43082D Other subluxation of left shoulder joint, subsequent encounter: Secondary | ICD-10-CM | POA: Diagnosis not present

## 2018-11-02 DIAGNOSIS — M7502 Adhesive capsulitis of left shoulder: Secondary | ICD-10-CM | POA: Diagnosis not present

## 2018-11-02 NOTE — Telephone Encounter (Signed)
Tried to call number back. No answer and voicemail not setup.

## 2018-11-02 NOTE — Telephone Encounter (Signed)
Kelly Lowe from [place] at [facility] called requesting extension of OT and [place].  OT 1 week x 1, 2 weeks x 3, Home Health 2 weeks x 4 to continue ADL's, shower, and meal prep.  Please call back with verbal authorization [phone]

## 2018-11-02 NOTE — Telephone Encounter (Signed)
Please call Dr. Durward Fortes and advise. Thank you.

## 2018-11-03 NOTE — Telephone Encounter (Signed)
Tried to call again. No answer. I emailed the information to Fritzi Mandes with [facility].

## 2018-11-04 DIAGNOSIS — S43082D Other subluxation of left shoulder joint, subsequent encounter: Secondary | ICD-10-CM | POA: Diagnosis not present

## 2018-11-04 DIAGNOSIS — M21372 Foot drop, left foot: Secondary | ICD-10-CM | POA: Diagnosis not present

## 2018-11-04 DIAGNOSIS — F4321 Adjustment disorder with depressed mood: Secondary | ICD-10-CM | POA: Diagnosis not present

## 2018-11-04 DIAGNOSIS — G8929 Other chronic pain: Secondary | ICD-10-CM | POA: Diagnosis not present

## 2018-11-04 DIAGNOSIS — Z87891 Personal history of nicotine dependence: Secondary | ICD-10-CM | POA: Diagnosis not present

## 2018-11-04 DIAGNOSIS — N319 Neuromuscular dysfunction of bladder, unspecified: Secondary | ICD-10-CM | POA: Diagnosis not present

## 2018-11-04 DIAGNOSIS — I69354 Hemiplegia and hemiparesis following cerebral infarction affecting left non-dominant side: Secondary | ICD-10-CM | POA: Diagnosis not present

## 2018-11-04 DIAGNOSIS — Z9181 History of falling: Secondary | ICD-10-CM | POA: Diagnosis not present

## 2018-11-04 DIAGNOSIS — I10 Essential (primary) hypertension: Secondary | ICD-10-CM | POA: Diagnosis not present

## 2018-11-04 DIAGNOSIS — I69318 Other symptoms and signs involving cognitive functions following cerebral infarction: Secondary | ICD-10-CM | POA: Diagnosis not present

## 2018-11-04 DIAGNOSIS — M7502 Adhesive capsulitis of left shoulder: Secondary | ICD-10-CM | POA: Diagnosis not present

## 2018-11-05 DIAGNOSIS — I69318 Other symptoms and signs involving cognitive functions following cerebral infarction: Secondary | ICD-10-CM | POA: Diagnosis not present

## 2018-11-05 DIAGNOSIS — M7502 Adhesive capsulitis of left shoulder: Secondary | ICD-10-CM | POA: Diagnosis not present

## 2018-11-05 DIAGNOSIS — I10 Essential (primary) hypertension: Secondary | ICD-10-CM | POA: Diagnosis not present

## 2018-11-05 DIAGNOSIS — I69354 Hemiplegia and hemiparesis following cerebral infarction affecting left non-dominant side: Secondary | ICD-10-CM | POA: Diagnosis not present

## 2018-11-05 DIAGNOSIS — G8929 Other chronic pain: Secondary | ICD-10-CM | POA: Diagnosis not present

## 2018-11-05 DIAGNOSIS — S43082D Other subluxation of left shoulder joint, subsequent encounter: Secondary | ICD-10-CM | POA: Diagnosis not present

## 2018-11-06 ENCOUNTER — Telehealth (INDEPENDENT_AMBULATORY_CARE_PROVIDER_SITE_OTHER): Payer: Self-pay | Admitting: Orthopaedic Surgery

## 2018-11-06 ENCOUNTER — Other Ambulatory Visit: Payer: Self-pay

## 2018-11-06 ENCOUNTER — Ambulatory Visit (INDEPENDENT_AMBULATORY_CARE_PROVIDER_SITE_OTHER): Payer: Medicare Other | Admitting: *Deleted

## 2018-11-06 DIAGNOSIS — I6300 Cerebral infarction due to thrombosis of unspecified precerebral artery: Secondary | ICD-10-CM | POA: Diagnosis not present

## 2018-11-06 LAB — CUP PACEART REMOTE DEVICE CHECK
Date Time Interrogation Session: 20200403014045
Implantable Pulse Generator Implant Date: 20190306

## 2018-11-06 NOTE — Telephone Encounter (Signed)
FYI: Patient was seen yesterday, and wanted to hold off on appt today. Patient has next scheduled home visit on Tuesday.

## 2018-11-09 NOTE — Telephone Encounter (Signed)
thanks

## 2018-11-09 NOTE — Telephone Encounter (Signed)
Just an fyi

## 2018-11-10 DIAGNOSIS — M7502 Adhesive capsulitis of left shoulder: Secondary | ICD-10-CM | POA: Diagnosis not present

## 2018-11-10 DIAGNOSIS — I69318 Other symptoms and signs involving cognitive functions following cerebral infarction: Secondary | ICD-10-CM | POA: Diagnosis not present

## 2018-11-10 DIAGNOSIS — I10 Essential (primary) hypertension: Secondary | ICD-10-CM | POA: Diagnosis not present

## 2018-11-10 DIAGNOSIS — G8929 Other chronic pain: Secondary | ICD-10-CM | POA: Diagnosis not present

## 2018-11-10 DIAGNOSIS — I69354 Hemiplegia and hemiparesis following cerebral infarction affecting left non-dominant side: Secondary | ICD-10-CM | POA: Diagnosis not present

## 2018-11-10 DIAGNOSIS — S43082D Other subluxation of left shoulder joint, subsequent encounter: Secondary | ICD-10-CM | POA: Diagnosis not present

## 2018-11-12 DIAGNOSIS — S43082D Other subluxation of left shoulder joint, subsequent encounter: Secondary | ICD-10-CM | POA: Diagnosis not present

## 2018-11-12 DIAGNOSIS — I69318 Other symptoms and signs involving cognitive functions following cerebral infarction: Secondary | ICD-10-CM | POA: Diagnosis not present

## 2018-11-12 DIAGNOSIS — I10 Essential (primary) hypertension: Secondary | ICD-10-CM | POA: Diagnosis not present

## 2018-11-12 DIAGNOSIS — I69354 Hemiplegia and hemiparesis following cerebral infarction affecting left non-dominant side: Secondary | ICD-10-CM | POA: Diagnosis not present

## 2018-11-12 DIAGNOSIS — G8929 Other chronic pain: Secondary | ICD-10-CM | POA: Diagnosis not present

## 2018-11-12 DIAGNOSIS — M7502 Adhesive capsulitis of left shoulder: Secondary | ICD-10-CM | POA: Diagnosis not present

## 2018-11-12 NOTE — Progress Notes (Signed)
[facility] Summary Report / Loop Recorder 

## 2018-11-17 DIAGNOSIS — I10 Essential (primary) hypertension: Secondary | ICD-10-CM | POA: Diagnosis not present

## 2018-11-17 DIAGNOSIS — S43082D Other subluxation of left shoulder joint, subsequent encounter: Secondary | ICD-10-CM | POA: Diagnosis not present

## 2018-11-17 DIAGNOSIS — I69318 Other symptoms and signs involving cognitive functions following cerebral infarction: Secondary | ICD-10-CM | POA: Diagnosis not present

## 2018-11-17 DIAGNOSIS — M7502 Adhesive capsulitis of left shoulder: Secondary | ICD-10-CM | POA: Diagnosis not present

## 2018-11-17 DIAGNOSIS — I69354 Hemiplegia and hemiparesis following cerebral infarction affecting left non-dominant side: Secondary | ICD-10-CM | POA: Diagnosis not present

## 2018-11-17 DIAGNOSIS — G8929 Other chronic pain: Secondary | ICD-10-CM | POA: Diagnosis not present

## 2018-11-19 DIAGNOSIS — M7502 Adhesive capsulitis of left shoulder: Secondary | ICD-10-CM | POA: Diagnosis not present

## 2018-11-19 DIAGNOSIS — I10 Essential (primary) hypertension: Secondary | ICD-10-CM | POA: Diagnosis not present

## 2018-11-19 DIAGNOSIS — I69354 Hemiplegia and hemiparesis following cerebral infarction affecting left non-dominant side: Secondary | ICD-10-CM | POA: Diagnosis not present

## 2018-11-19 DIAGNOSIS — S43082D Other subluxation of left shoulder joint, subsequent encounter: Secondary | ICD-10-CM | POA: Diagnosis not present

## 2018-11-19 DIAGNOSIS — G8929 Other chronic pain: Secondary | ICD-10-CM | POA: Diagnosis not present

## 2018-11-19 DIAGNOSIS — I69318 Other symptoms and signs involving cognitive functions following cerebral infarction: Secondary | ICD-10-CM | POA: Diagnosis not present

## 2018-11-23 ENCOUNTER — Telehealth (INDEPENDENT_AMBULATORY_CARE_PROVIDER_SITE_OTHER): Payer: Self-pay | Admitting: Orthopaedic Surgery

## 2018-11-23 DIAGNOSIS — S43082D Other subluxation of left shoulder joint, subsequent encounter: Secondary | ICD-10-CM | POA: Diagnosis not present

## 2018-11-23 DIAGNOSIS — I69354 Hemiplegia and hemiparesis following cerebral infarction affecting left non-dominant side: Secondary | ICD-10-CM | POA: Diagnosis not present

## 2018-11-23 DIAGNOSIS — I69318 Other symptoms and signs involving cognitive functions following cerebral infarction: Secondary | ICD-10-CM | POA: Diagnosis not present

## 2018-11-23 DIAGNOSIS — G8929 Other chronic pain: Secondary | ICD-10-CM | POA: Diagnosis not present

## 2018-11-23 DIAGNOSIS — M7502 Adhesive capsulitis of left shoulder: Secondary | ICD-10-CM | POA: Diagnosis not present

## 2018-11-23 DIAGNOSIS — I10 Essential (primary) hypertension: Secondary | ICD-10-CM | POA: Diagnosis not present

## 2018-11-23 NOTE — Telephone Encounter (Signed)
FYI: Patient refused services on Friday because she was seen on Thursday. ( [facility])

## 2018-11-24 DIAGNOSIS — I69318 Other symptoms and signs involving cognitive functions following cerebral infarction: Secondary | ICD-10-CM | POA: Diagnosis not present

## 2018-11-24 DIAGNOSIS — G8929 Other chronic pain: Secondary | ICD-10-CM | POA: Diagnosis not present

## 2018-11-24 DIAGNOSIS — I10 Essential (primary) hypertension: Secondary | ICD-10-CM | POA: Diagnosis not present

## 2018-11-24 DIAGNOSIS — M7502 Adhesive capsulitis of left shoulder: Secondary | ICD-10-CM | POA: Diagnosis not present

## 2018-11-24 DIAGNOSIS — I69354 Hemiplegia and hemiparesis following cerebral infarction affecting left non-dominant side: Secondary | ICD-10-CM | POA: Diagnosis not present

## 2018-11-24 DIAGNOSIS — S43082D Other subluxation of left shoulder joint, subsequent encounter: Secondary | ICD-10-CM | POA: Diagnosis not present

## 2018-11-24 NOTE — Telephone Encounter (Signed)
Spoke with patient's daughter. [facility] came out yesterday.

## 2018-11-24 NOTE — Telephone Encounter (Signed)
Please see below and call patient to advise. Thank you.

## 2018-11-24 NOTE — Telephone Encounter (Signed)
Please advise 

## 2018-11-26 DIAGNOSIS — M7502 Adhesive capsulitis of left shoulder: Secondary | ICD-10-CM | POA: Diagnosis not present

## 2018-11-26 DIAGNOSIS — I69318 Other symptoms and signs involving cognitive functions following cerebral infarction: Secondary | ICD-10-CM | POA: Diagnosis not present

## 2018-11-26 DIAGNOSIS — G8929 Other chronic pain: Secondary | ICD-10-CM | POA: Diagnosis not present

## 2018-11-26 DIAGNOSIS — I69354 Hemiplegia and hemiparesis following cerebral infarction affecting left non-dominant side: Secondary | ICD-10-CM | POA: Diagnosis not present

## 2018-11-26 DIAGNOSIS — I10 Essential (primary) hypertension: Secondary | ICD-10-CM | POA: Diagnosis not present

## 2018-11-26 DIAGNOSIS — S43082D Other subluxation of left shoulder joint, subsequent encounter: Secondary | ICD-10-CM | POA: Diagnosis not present

## 2018-11-27 DIAGNOSIS — G8929 Other chronic pain: Secondary | ICD-10-CM | POA: Diagnosis not present

## 2018-11-27 DIAGNOSIS — M7502 Adhesive capsulitis of left shoulder: Secondary | ICD-10-CM | POA: Diagnosis not present

## 2018-11-27 DIAGNOSIS — S43082D Other subluxation of left shoulder joint, subsequent encounter: Secondary | ICD-10-CM | POA: Diagnosis not present

## 2018-11-27 DIAGNOSIS — I69318 Other symptoms and signs involving cognitive functions following cerebral infarction: Secondary | ICD-10-CM | POA: Diagnosis not present

## 2018-11-27 DIAGNOSIS — I10 Essential (primary) hypertension: Secondary | ICD-10-CM | POA: Diagnosis not present

## 2018-11-27 DIAGNOSIS — I69354 Hemiplegia and hemiparesis following cerebral infarction affecting left non-dominant side: Secondary | ICD-10-CM | POA: Diagnosis not present

## 2018-11-30 DIAGNOSIS — F4321 Adjustment disorder with depressed mood: Secondary | ICD-10-CM | POA: Diagnosis not present

## 2018-11-30 DIAGNOSIS — I10 Essential (primary) hypertension: Secondary | ICD-10-CM | POA: Diagnosis not present

## 2018-11-30 DIAGNOSIS — G8194 Hemiplegia, unspecified affecting left nondominant side: Secondary | ICD-10-CM | POA: Diagnosis not present

## 2018-11-30 DIAGNOSIS — E785 Hyperlipidemia, unspecified: Secondary | ICD-10-CM | POA: Diagnosis not present

## 2018-12-14 ENCOUNTER — Other Ambulatory Visit: Payer: Self-pay | Admitting: *Deleted

## 2018-12-14 NOTE — Telephone Encounter (Signed)
Yes please

## 2018-12-15 ENCOUNTER — Other Ambulatory Visit: Payer: Self-pay

## 2018-12-15 ENCOUNTER — Ambulatory Visit (INDEPENDENT_AMBULATORY_CARE_PROVIDER_SITE_OTHER): Payer: Medicare Other | Admitting: *Deleted

## 2018-12-15 DIAGNOSIS — I6300 Cerebral infarction due to thrombosis of unspecified precerebral artery: Secondary | ICD-10-CM

## 2018-12-16 LAB — CUP PACEART REMOTE DEVICE CHECK
Date Time Interrogation Session: 20200512034148
Implantable Pulse Generator Implant Date: 20190306

## 2018-12-25 ENCOUNTER — Ambulatory Visit: Payer: Medicare Other | Admitting: Physical Medicine & Rehabilitation

## 2018-12-30 ENCOUNTER — Encounter: Payer: Self-pay | Admitting: Physical Medicine & Rehabilitation

## 2018-12-30 ENCOUNTER — Encounter: Payer: Medicare Other | Attending: Physical Medicine & Rehabilitation | Admitting: Physical Medicine & Rehabilitation

## 2018-12-30 ENCOUNTER — Other Ambulatory Visit: Payer: Self-pay

## 2018-12-30 VITALS — BP 100/74 | HR 87 | Temp 97.5°F | Ht 67.0 in | Wt 142.0 lb

## 2018-12-30 DIAGNOSIS — S43002D Unspecified subluxation of left shoulder joint, subsequent encounter: Secondary | ICD-10-CM

## 2018-12-30 DIAGNOSIS — M792 Neuralgia and neuritis, unspecified: Secondary | ICD-10-CM

## 2018-12-30 DIAGNOSIS — R209 Unspecified disturbances of skin sensation: Secondary | ICD-10-CM

## 2018-12-30 DIAGNOSIS — G8929 Other chronic pain: Secondary | ICD-10-CM

## 2018-12-30 DIAGNOSIS — F4321 Adjustment disorder with depressed mood: Secondary | ICD-10-CM

## 2018-12-30 DIAGNOSIS — G479 Sleep disorder, unspecified: Secondary | ICD-10-CM

## 2018-12-30 DIAGNOSIS — R269 Unspecified abnormalities of gait and mobility: Secondary | ICD-10-CM

## 2018-12-30 DIAGNOSIS — I639 Cerebral infarction, unspecified: Secondary | ICD-10-CM

## 2018-12-30 DIAGNOSIS — I69359 Hemiplegia and hemiparesis following cerebral infarction affecting unspecified side: Secondary | ICD-10-CM

## 2018-12-30 DIAGNOSIS — G8110 Spastic hemiplegia affecting unspecified side: Secondary | ICD-10-CM

## 2018-12-30 DIAGNOSIS — M25512 Pain in left shoulder: Secondary | ICD-10-CM

## 2018-12-30 DIAGNOSIS — I69398 Other sequelae of cerebral infarction: Secondary | ICD-10-CM

## 2018-12-30 MED ORDER — GABAPENTIN 300 MG PO CAPS: 300.0000 mg | ORAL_CAPSULE | Freq: Two times a day (BID) | ORAL | 1 refills | Status: DC

## 2018-12-30 MED ORDER — BACLOFEN 10 MG PO TABS: 10.0000 mg | ORAL_TABLET | Freq: Two times a day (BID) | ORAL | 1 refills | Status: DC

## 2018-12-30 NOTE — Progress Notes (Signed)
Subjective:    Patient ID: Kelly Lowe, female    DOB: Feb 16, 1951, 68 y.o.   MRN: [id number]  TELEHEALTH NOTE  Due to national recommendations of social distancing due to COVID 19, an audio/video telehealth visit is felt to be most appropriate for this patient at this time.  See Chart message from today for the patient's consent to telehealth from [place].     I verified that I am speaking with the correct person using two identifiers.  Location of patient: Home Location of provider: Office Method of communication: Telephone Names of participants : Zorita Pang scheduling, Jasmine December obtaining consent and vitals if available Established patient Time spent on call: 22 minutes  HPI  Female with history of HTN, right foot fractures presents for follow for right basal ganglia infarct.   Last clinic visit 08/20/2018.  Patient states she has been doing "okay".  She has completed HH therapies and is doing HEP.  She notes "cotton mouth".  She notes improvement with Gabapentin.  She does notice functional improvement with Dysport. She is using cane for ambulation.  Denies falls.  She states she could not afford Nudexta. Patient later states her arm is tighter and interfering with function. She saw Ortho for subluxation and therapies were ordered.    Pain Inventory Average Pain 4 Pain Right Now 0 My pain is intermittent and dull  In the last 24 hours, has pain interfered with the following? General activity 0 Relation with others 0 Enjoyment of life 0 What TIME of day is your pain at its worst? morning Sleep (in general) Good  Pain is worse with: bending, some activites and not wearing brace Pain improves with: heat/ice, therapy/exercise and pacing activities Relief from Meds: n/a  Mobility walk with assistance use a cane how many minutes can you walk? 10-15 ability to climb steps?  yes do you drive?  no Do you have any goals in this  area?  yes  Function retired I need assistance with the following:  dressing, bathing, meal prep, household duties and shopping  Neuro/Psych tingling trouble walking spasms dizziness anxiety  Prior Studies Any changes since last visit?  no  Physicians involved in your care Any changes since last visit?  no   Family History  Problem Relation Age of Onset  . Stroke Maternal Grandmother   . Stroke Paternal Grandfather    Social History   Socioeconomic History  . Marital status: Divorced    Spouse name: Not on file  . Number of children: Not on file  . Years of education: Not on file  . Highest education level: Not on file  Occupational History  . Not on file  Social Needs  . Financial resource strain: Not on file  . Food insecurity:    Worry: Not on file    Inability: Not on file  . Transportation needs:    Medical: Not on file    Non-medical: Not on file  Tobacco Use  . Smoking status: Former Smoker    Packs/day: 0.50    Years: 25.00    Pack years: 12.50    Types: Cigarettes    Last attempt to quit: 05/02/2017    Years since quitting: 1.6  . Smokeless tobacco: Never Used  Substance and Sexual Activity  . Alcohol use: No    Alcohol/week: 1.0 standard drinks    Types: 1 Glasses of wine per week    Frequency: Never    Comment: social  .  Drug use: No  . Sexual activity: Never  Lifestyle  . Physical activity:    Days per week: Not on file    Minutes per session: Not on file  . Stress: Not on file  Relationships  . Social connections:    Talks on phone: Not on file    Gets together: Not on file    Attends religious service: Not on file    Active member of club or organization: Not on file    Attends meetings of clubs or organizations: Not on file    Relationship status: Not on file  Other Topics Concern  . Not on file  Social History Narrative  . Not on file   Past Surgical History:  Procedure Laterality Date  . FOOT SURGERY    . LOOP RECORDER  INSERTION N/A 10/08/2017   Procedure: LOOP RECORDER INSERTION;  Surgeon: Deboraha Sprang, MD;  Location: [facility];  Service: Cardiovascular;  Laterality: N/A;  . TEE WITHOUT CARDIOVERSION N/A 05/08/2017   Procedure: TRANSESOPHAGEAL ECHOCARDIOGRAM (TEE);  Surgeon: Lelon Perla, MD;  Location: [facility];  Service: Cardiovascular;  Laterality: N/A;   Past Medical History:  Diagnosis Date  . Hyperlipidemia   . Hypertension   . Stroke ([place])    BP 100/74 Comment: pt reported virtual visit  Pulse 87 Comment: pt reported virtual visit  Temp (!) 97.5 F (36.4 C) Comment: pt reported virtual visit  Ht 5\' 7"  (1.702 m) Comment: pt reported virtual visit  Wt 142 lb (64.4 kg) Comment: pt reported virtual visit  BMI 22.24 kg/m   Opioid Risk Score:   Fall Risk Score:  `1  Depression screen PHQ 2/9  Depression screen [facility] 2/9 12/30/2018 04/09/2018 01/09/2018 11/28/2017 11/13/2017 06/19/2017 06/19/2017  Decreased Interest 0 0 0 1 0 0 0  Down, Depressed, Hopeless 0 0 0 1 2 0 0  PHQ - 2 Score 0 0 0 2 2 0 0  Altered sleeping - - - 1 1 3  -  Tired, decreased energy - - - 1 0 2 -  Change in appetite - - - 1 0 1 -  Feeling bad or failure about yourself  - - - 1 1 1  -  Trouble concentrating - - - 1 0 1 -  Moving slowly or fidgety/restless - - - 1 1 1  -  Suicidal thoughts - - - 0 0 0 -  PHQ-9 Score - - - 8 5 9  -  Difficult doing work/chores - - - Somewhat difficult Somewhat difficult Not difficult at all -    Review of Systems  Constitutional: Negative.   HENT: Negative.   Eyes: Negative.   Respiratory: Negative.   Cardiovascular: Negative.   Gastrointestinal: Positive for constipation.       Poor appetite  Endocrine: Negative.   Genitourinary: Negative.   Musculoskeletal: Positive for arthralgias.       Spasms  Skin: Positive for rash. Negative for wound.       Scratch a sore on foot  Allergic/Immunologic: Negative.   Neurological: Positive for weakness.       Tingling    Hematological: Negative.   Psychiatric/Behavioral: The patient is nervous/anxious.        Mother passed away and is very emotional  All other systems reviewed and are negative.     Objective:   Physical Exam  Gen: NAD. Pulm: Effort normal Neuro: Alert and oriented    Assessment & Plan:  Female with history of HTN, right foot  fractures presents for follow for right basal ganglia infarct.   1.  Left spastic hemiparesis, fatigue, and functional deficits secondary to right basal ganglia infarct  Cont HEP  No benefit with amantadine  Cont WHO/PRAFO  Will hold off on Dysport injections at this time to evaluate for functional efficacy.   Will increase Gabapentin to 300 BID for numbness  Will increase Baclofen to 10 BID  2. Gait abnormality  Cont cane for safety  3. Sleep disturbance  Improved  4. Mood lability:  Patient did not find benefit with Psychology  Cont Cymbalta to 60mg  daily  Nudexta ordered by Neurology - but cannot afford  Will refer to Psychiatry  5. Left shoulder subluxation with pain  Kinesio taping on hold due to reaction to tape  SPRINT placed on 9/5, removed on 11/6  Tramadol per PCP (educated on signs/symptoms serotonin syndrome)  Ortho consult ordered by Neurology NP - recommended therapies, which she completed  6. Bony prominence right wrist  Xray with bony prominence  Voltaren gel in effective  Good benefits with injection on 9/26  7. Right hand numbness  Managed at present  Positional

## 2018-12-31 NOTE — Progress Notes (Signed)
[facility] Summary Report / Loop Recorder 

## 2019-01-06 DIAGNOSIS — L853 Xerosis cutis: Secondary | ICD-10-CM | POA: Diagnosis not present

## 2019-01-06 DIAGNOSIS — R682 Dry mouth, unspecified: Secondary | ICD-10-CM | POA: Diagnosis not present

## 2019-01-06 DIAGNOSIS — K130 Diseases of lips: Secondary | ICD-10-CM | POA: Diagnosis not present

## 2019-01-06 DIAGNOSIS — S90819A Abrasion, unspecified foot, initial encounter: Secondary | ICD-10-CM | POA: Diagnosis not present

## 2019-01-18 ENCOUNTER — Ambulatory Visit (INDEPENDENT_AMBULATORY_CARE_PROVIDER_SITE_OTHER): Payer: Medicare Other | Admitting: *Deleted

## 2019-01-18 DIAGNOSIS — I6300 Cerebral infarction due to thrombosis of unspecified precerebral artery: Secondary | ICD-10-CM

## 2019-01-18 LAB — CUP PACEART REMOTE DEVICE CHECK
Date Time Interrogation Session: 20200614064401
Implantable Pulse Generator Implant Date: 20190306

## 2019-01-19 DIAGNOSIS — L989 Disorder of the skin and subcutaneous tissue, unspecified: Secondary | ICD-10-CM | POA: Diagnosis not present

## 2019-01-19 DIAGNOSIS — K146 Glossodynia: Secondary | ICD-10-CM | POA: Diagnosis not present

## 2019-01-26 NOTE — Progress Notes (Signed)
[facility] Summary Report / Loop Recorder 

## 2019-01-28 ENCOUNTER — Encounter: Payer: Medicare Other | Admitting: Physical Medicine & Rehabilitation

## 2019-01-31 ENCOUNTER — Other Ambulatory Visit: Payer: Self-pay | Admitting: Physical Medicine & Rehabilitation

## 2019-02-19 ENCOUNTER — Ambulatory Visit (INDEPENDENT_AMBULATORY_CARE_PROVIDER_SITE_OTHER): Payer: Medicare Other | Admitting: *Deleted

## 2019-02-19 DIAGNOSIS — I6300 Cerebral infarction due to thrombosis of unspecified precerebral artery: Secondary | ICD-10-CM

## 2019-02-19 LAB — CUP PACEART REMOTE DEVICE CHECK
Date Time Interrogation Session: 20200717062522
Implantable Pulse Generator Implant Date: 20190306

## 2019-02-24 NOTE — Progress Notes (Signed)
[facility] Summary Report / Loop Recorder 

## 2019-03-10 ENCOUNTER — Other Ambulatory Visit: Payer: Self-pay | Admitting: Physical Medicine & Rehabilitation

## 2019-03-23 DIAGNOSIS — L309 Dermatitis, unspecified: Secondary | ICD-10-CM | POA: Diagnosis not present

## 2019-03-23 DIAGNOSIS — K1370 Unspecified lesions of oral mucosa: Secondary | ICD-10-CM | POA: Diagnosis not present

## 2019-03-25 ENCOUNTER — Ambulatory Visit (INDEPENDENT_AMBULATORY_CARE_PROVIDER_SITE_OTHER): Payer: Medicare Other | Admitting: *Deleted

## 2019-03-25 DIAGNOSIS — I6300 Cerebral infarction due to thrombosis of unspecified precerebral artery: Secondary | ICD-10-CM

## 2019-03-25 LAB — CUP PACEART REMOTE DEVICE CHECK
Date Time Interrogation Session: 20200819114127
Implantable Pulse Generator Implant Date: 20190306

## 2019-04-02 NOTE — Progress Notes (Signed)
[facility] Summary Report / Loop Recorder 

## 2019-04-09 ENCOUNTER — Other Ambulatory Visit: Payer: Self-pay | Admitting: Physical Medicine & Rehabilitation

## 2019-04-26 ENCOUNTER — Ambulatory Visit (INDEPENDENT_AMBULATORY_CARE_PROVIDER_SITE_OTHER): Payer: Medicare Other | Admitting: *Deleted

## 2019-04-26 DIAGNOSIS — I6300 Cerebral infarction due to thrombosis of unspecified precerebral artery: Secondary | ICD-10-CM

## 2019-04-27 LAB — CUP PACEART REMOTE DEVICE CHECK
Date Time Interrogation Session: 20200921134124
Implantable Pulse Generator Implant Date: 20190306

## 2019-05-04 NOTE — Progress Notes (Signed)
[facility] Summary Report / Loop Recorder 

## 2019-05-30 LAB — CUP PACEART REMOTE DEVICE CHECK
Date Time Interrogation Session: 20201024133844
Implantable Pulse Generator Implant Date: 20190306

## 2019-05-31 ENCOUNTER — Ambulatory Visit (INDEPENDENT_AMBULATORY_CARE_PROVIDER_SITE_OTHER): Payer: Medicare Other | Admitting: *Deleted

## 2019-05-31 DIAGNOSIS — I6300 Cerebral infarction due to thrombosis of unspecified precerebral artery: Secondary | ICD-10-CM | POA: Diagnosis not present

## 2019-06-01 ENCOUNTER — Other Ambulatory Visit: Payer: Self-pay | Admitting: Physical Medicine & Rehabilitation

## 2019-06-10 DIAGNOSIS — I6523 Occlusion and stenosis of bilateral carotid arteries: Secondary | ICD-10-CM | POA: Diagnosis not present

## 2019-06-10 DIAGNOSIS — E785 Hyperlipidemia, unspecified: Secondary | ICD-10-CM | POA: Diagnosis not present

## 2019-06-10 DIAGNOSIS — F4321 Adjustment disorder with depressed mood: Secondary | ICD-10-CM | POA: Diagnosis not present

## 2019-06-10 DIAGNOSIS — Z1389 Encounter for screening for other disorder: Secondary | ICD-10-CM | POA: Diagnosis not present

## 2019-06-10 DIAGNOSIS — G8194 Hemiplegia, unspecified affecting left nondominant side: Secondary | ICD-10-CM | POA: Diagnosis not present

## 2019-06-10 DIAGNOSIS — I10 Essential (primary) hypertension: Secondary | ICD-10-CM | POA: Diagnosis not present

## 2019-06-10 DIAGNOSIS — K1379 Other lesions of oral mucosa: Secondary | ICD-10-CM | POA: Diagnosis not present

## 2019-06-10 DIAGNOSIS — Z0000 Encounter for general adult medical examination without abnormal findings: Secondary | ICD-10-CM | POA: Diagnosis not present

## 2019-06-16 DIAGNOSIS — Z23 Encounter for immunization: Secondary | ICD-10-CM | POA: Diagnosis not present

## 2019-06-16 DIAGNOSIS — I10 Essential (primary) hypertension: Secondary | ICD-10-CM | POA: Diagnosis not present

## 2019-06-16 DIAGNOSIS — E785 Hyperlipidemia, unspecified: Secondary | ICD-10-CM | POA: Diagnosis not present

## 2019-06-16 DIAGNOSIS — Z0000 Encounter for general adult medical examination without abnormal findings: Secondary | ICD-10-CM | POA: Diagnosis not present

## 2019-06-18 NOTE — Progress Notes (Signed)
[facility] Summary Report / Loop Recorder 

## 2019-06-29 DIAGNOSIS — K1379 Other lesions of oral mucosa: Secondary | ICD-10-CM | POA: Diagnosis not present

## 2019-07-04 LAB — CUP PACEART REMOTE DEVICE CHECK
Date Time Interrogation Session: 20201126084039
Implantable Pulse Generator Implant Date: 20190306

## 2019-07-05 ENCOUNTER — Ambulatory Visit (INDEPENDENT_AMBULATORY_CARE_PROVIDER_SITE_OTHER): Payer: Medicare Other | Admitting: *Deleted

## 2019-07-05 DIAGNOSIS — I6300 Cerebral infarction due to thrombosis of unspecified precerebral artery: Secondary | ICD-10-CM

## 2019-07-07 ENCOUNTER — Telehealth: Payer: Self-pay | Admitting: Adult Health

## 2019-07-07 NOTE — Telephone Encounter (Signed)
Pt's daughter called and states that the pt is needing to speak to the RN about a pain she has been having in her mouth due to the stroke she had. Pt states it is hard to eat due to the pain. Please advise.

## 2019-07-09 ENCOUNTER — Other Ambulatory Visit: Payer: Self-pay | Admitting: Physical Medicine & Rehabilitation

## 2019-07-13 NOTE — Telephone Encounter (Signed)
I called pt back.  She is having problem with scorching/burning tongue sensation.  It makes it hard for her to eat.  She has seen pcp, ENT and they recommended to see [place].  She has uncontrollable outbursts and was not able to afford Nudexta, $600/month. PAP was denied per pt.  I relayed that was not sure this a neuro problem, but she did not have appt so made one tentatively for 21-10-20 as f/u.

## 2019-07-14 NOTE — Telephone Encounter (Signed)
I called pts daughter. Trying to speak to her about the appt made for her mother.

## 2019-07-14 NOTE — Telephone Encounter (Signed)
I called and LMVM for pts daughter to return call. May need to see MD vs NP for issue that is going on.

## 2019-07-14 NOTE — Telephone Encounter (Signed)
Pt daughter(Shipman,Lauren [phone] on DPR) has called back stating she feels pt should have another MRI.  Shipman,Lauren has accepted the appointment offered on 07-15-19, she wants to know if the appointment can be virtual.  [place] will speak with NP Janett Billow and call Shipman,Lauren on that request.

## 2019-07-14 NOTE — Telephone Encounter (Signed)
Unable to view PCP notes regarding tongue concerns.  She has been previously seen in this office almost 1 year ago for stroke follow-up and has not been seen since that time despite recommended 56-month follow-up.  If PCP and ENT believe this is a neurological issue, a referral should be placed as this would not be stroke related.  She was referred to psychiatry by physical medicine and rehab regarding emotional lability as different antidepressants have been trialed without benefit.

## 2019-07-14 NOTE — Telephone Encounter (Signed)
I spoke to daughter.  After speaking her, and the ENT checking her tongue and was seen, he relayed thought it might be neurological (possibly from when she had stroke ??).  I went ahead and made VV appt when daughter would be there as family issues could not come in office.  I told her that her sym ? Stroke ? Neuro? New problem would need to see MD.  Some question if so.  I went and made decision to keep with NP.

## 2019-07-15 ENCOUNTER — Encounter: Payer: Self-pay | Admitting: Adult Health

## 2019-07-15 ENCOUNTER — Telehealth: Payer: Self-pay | Admitting: *Deleted

## 2019-07-15 ENCOUNTER — Telehealth (INDEPENDENT_AMBULATORY_CARE_PROVIDER_SITE_OTHER): Payer: Medicare Other | Admitting: Adult Health

## 2019-07-15 DIAGNOSIS — I639 Cerebral infarction, unspecified: Secondary | ICD-10-CM

## 2019-07-15 DIAGNOSIS — K146 Glossodynia: Secondary | ICD-10-CM | POA: Diagnosis not present

## 2019-07-15 DIAGNOSIS — I10 Essential (primary) hypertension: Secondary | ICD-10-CM | POA: Diagnosis not present

## 2019-07-15 DIAGNOSIS — E785 Hyperlipidemia, unspecified: Secondary | ICD-10-CM

## 2019-07-15 NOTE — Progress Notes (Signed)
[facility] [address] [place]. [place] [address] [phone]       VIRTUAL VISIT FOLLOW-UP NOTE  Kelly Lowe Date of Birth:  12/11/1950 Medical Record Number:  [id number]   Virtual Visit via Video Note  I connected with [name] on 07/15/19 at 12:45 PM EST by a video enabled telemedicine application located remotely in my own home and verified that I am speaking with the correct person using two identifiers who was located at their own home.   Visit scheduled by Oliver Hum, RN who discussed the limitations of evaluation and management by telemedicine and the availability of in person appointments. The patient expressed understanding and agreed to proceed.    HPI: Initial visit 08/22/2017 Dr. Leonie Man :Kelly Lowe is seen today for the first office follow-up visit following hospital admission for stroke in September  2018. She is accompanied by her daughter. History is obtained from them as well as review of electronic medical records. I have personally reviewed imaging films.Kelly Schild Pattersonis a 68 y.o.femalelast in her normal state of health last night around 11 which went to bed.. She awoke with left-sided weakness. Due to the presence of gaze deviation and weakness, code stroke was activated and she was taken for CT perfusion/angiogram. This was negative for large vessel occlusion. LKW: 05/02/17 night prior to bed.tpa given?: no, outside of window. CT angiogram was negative for large vessel stenosis or occlusion. MRI scan of the brain showed a large 4.4 x 2.1 x 3.2 cm acute ischemic infarct involving the right basal ganglia with a tiny punctate infarct involving the left splenium of corpus callosum. Transthoracic echo showed normal ejection fraction and trans-esophageal echocardiogram showed no evidence of PFO or cardiac source of embolism. Lower extremity venous Doppler showed a small gastrocnemius vein clot. LDL cholesterol 114 mg percent and  implement A1c was 5.4. Transcranial Doppler bubble study was negative for PFO Patient was started on aspirin for stroke prevention. She states she's done well since discharge and is progressing with physical therapy but yet has significant residual spastic left hemiplegia. She is able to use a cane because she is a wheelchair for long distances. Is working with therapy twice a week. She has quit smoking completely. She has lost about 12 pounds weight as well. He has not yet had a loop recorder inserted and has questions about the procedure. She wants to walk better with improved balance. She does have a left foot brace but she is not happy that she has to use it all the time.  Update 12/08/2017 Dr. Leonie Man: she returns for follow-up after last by daughter.es to have spastic left hemiplegia and is now walks with a hemiwalke She is seeing Dr. Delice Lesch and getting Botox to her left arm and leg. She feels that that has been no significant improvement so far. She is complaining of increased pain and weakness in in her right wrist which may be related to  muscle overuse or carpal tunnel as she predominantly uses her right hand to hold her hemiwalker and grabs it tightly as she is scared to fallShe states her blood pressure is well controlled today it is 112/68. She is tolerating aspirin well without bleeding or bruising. She still has some residual   and numbness from her stroke which has not yet improved. She is tolerating aspirin well without bruising or bleeding. Her blood pressure is well controlled and today it is 112/68. She has had no falls or injuries.  Update  08/10/2018: Patient is being seen today for 26-month follow-up visit and is accompanied by her daughter.  She did undergo EMG/nerve conduction study which was normal without any evidence of nerve impingement.  She continues to follow with Dr. Posey Pronto and physical medicine for Botox injections due to continued post stroke spasticity. She does not believe this  has provided any benefit. They do have scheduled f//u appt on 08/20/18.  Daughter is also concerned regarding her left shoulder and was told this was "dislocated" with multiple treatment attempts by physical medicine but she continues to experience pain and decreased range of motion in that shoulder.  Daughter is concerned as this could be preventing possible further recovery and is requesting referral to orthopedics.  Patient has recently moved back where she lives independently in 07/2018 (previously living with her daughter).  Daughter does continue to assist minimally with bathing and dressing but does need further assistance with IADLs.  She continues to ambulate with her cane and denies any recent falls.  Daughter also has concerns regarding her mood and "secluding herself".  Patient started to become tearful during appointment stating that she is fearful of contacting any of her friends as she is "unable to communicate and they well understand what I am saying".  She has trialed Prozac in the past but discontinued due to possible side effects and continues on Cymbalta without much benefit.  Daughter does state that initially she would have outbursts of laughing or become amused easily but more recently she has been experiencing crying outbursts and not necessarily during appropriate times.  She continues on aspirin without side effects of bleeding or bruising.  Continues on atorvastatin without side effects myalgias.  Blood pressure today 117/76.  Daughter is questioning whether antihypertensives can start to be weaned off as her blood pressures consistently on the lower side.  Loop recorder has not shown atrial fibrillation thus far.  No further concerns at this time.  Denies new or worsening stroke/TIA symptoms.   Virtual visit 07/15/2019: Kelly Lowe is a 68 year old female who is being seen today via virtual by request of her daughter due to recent onset of worsening tongue pain.  She was apparently  evaluated by PCP and ENT and felt symptoms neurological in nature.  She endorses post stroke deficit tongue numbness/tingling but in September, started to experience a burning sensation along with feeling of "wet fuzz and increased spit".  She does endorse increased dryness greater in the morning when she first wakes up.  She endorses sensation on her entire tongue but denies any other location in her mouth or pain on her face or head.  Residual stroke deficits of left spastic hemiplegia has been stable without worsening.  She denies any other stroke deficits or new onset of symptoms.  Due to increased pain, she has had limited nutritional intake with only soft foods causing additional weight loss which her daughter is very concerned with.  She states the pain is constant but does worsen with food intake and increase speaking.  She has continued on gabapentin 300 mg nightly previously recommended increasing dose to twice daily but patient returned only taking nightly due to wishing to limit medication use.  She has also continued on Cymbalta 60 mg daily for depression management.  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  She endorses continuation of aspirin 325 mg daily and atorvastatin 40 mg daily for secondary stroke prevention without side effects.  She did have  recent lipid panel obtained by PCP which showed LDL 52 along with evaluation of thyroid function with TSH 1.29.  Loop recorder has not shown atrial fibrillation thus far.    ROS:   14 system review of systems is positive for numbness/tingling, tongue pain, weakness, ambulation difficulty, weight loss, change in appetite, depression/anxiety and all other systems negative  PMH:  Past Medical History:  Diagnosis Date  . Hyperlipidemia   . Hypertension   . Stroke [facility])     Social History:  Social History   Socioeconomic History  . Marital status: Divorced    Spouse name: Not  on file  . Number of children: Not on file  . Years of education: Not on file  . Highest education level: Not on file  Occupational History  . Not on file  Tobacco Use  . Smoking status: Former Smoker    Packs/day: 0.50    Years: 25.00    Pack years: 12.50    Types: Cigarettes    Quit date: 05/02/2017    Years since quitting: 2.2  . Smokeless tobacco: Never Used  Substance and Sexual Activity  . Alcohol use: No    Alcohol/week: 1.0 standard drinks    Types: 1 Glasses of wine per week    Comment: social  . Drug use: No  . Sexual activity: Never  Other Topics Concern  . Not on file  Social History Narrative  . Not on file   Social Determinants of Health   Financial Resource Strain:   . Difficulty of Paying Living Expenses: Not on file  Food Insecurity:   . Worried About Charity fundraiser in the Last Year: Not on file  . Ran Out of Food in the Last Year: Not on file  Transportation Needs:   . Lack of Transportation (Medical): Not on file  . Lack of Transportation (Non-Medical): Not on file  Physical Activity:   . Days of Exercise per Week: Not on file  . Minutes of Exercise per Session: Not on file  Stress:   . Feeling of Stress : Not on file  Social Connections:   . Frequency of Communication with Friends and Family: Not on file  . Frequency of Social Gatherings with Friends and Family: Not on file  . Attends Religious Services: Not on file  . Active Member of Clubs or Organizations: Not on file  . Attends Archivist Meetings: Not on file  . Marital Status: Not on file  Intimate Partner Violence:   . Fear of Current or Ex-Partner: Not on file  . Emotionally Abused: Not on file  . Physically Abused: Not on file  . Sexually Abused: Not on file    Medications:   Current Outpatient Medications on File Prior to Visit  Medication Sig Dispense Refill  . acetaminophen (TYLENOL) 325 MG tablet Take 2 tablets (650 mg total) by mouth every 6 (six) hours as  needed for mild pain (or Fever >/= 101). 30 tablet   . amLODipine (NORVASC) 5 MG tablet Take 5 mg by mouth daily.  1  . aspirin 325 MG tablet Take 1 tablet (325 mg total) by mouth daily. 30 tablet 0  . atorvastatin (LIPITOR) 40 MG tablet Take 1 tablet (40 mg total) by mouth daily at 6 PM. 30 tablet 0  . baclofen (LIORESAL) 10 MG tablet TAKE 1 TABLET(10 MG) BY MOUTH TWICE DAILY 60 tablet 1  . Biotin 1000 MCG tablet Take 1,000 mcg by mouth daily.    Marland Kitchen  diclofenac sodium (VOLTAREN) 1 % GEL Apply 2 g topically 4 (four) times daily. (Patient not taking: Reported on 08/25/2018) 1 Tube 1  . DULoxetine (CYMBALTA) 60 MG capsule TAKE 1 CAPSULE(60 MG) BY MOUTH DAILY 90 capsule 0  . gabapentin (NEURONTIN) 300 MG capsule TAKE ONE CAPSULE BY MOUTH TWICE DAILY 60 capsule 1  . hydrochlorothiazide (HYDRODIURIL) 12.5 MG tablet Take 12.5 mg by mouth every morning.  1  . Melatonin 3 MG TABS Take 3 mg by mouth at bedtime.     . polyethylene glycol (MIRALAX / GLYCOLAX) packet Take 17 g by mouth daily as needed (for constipation.).     Marland Kitchen vitamin B-12 (CYANOCOBALAMIN) 1000 MCG tablet Take 1,000 mcg by mouth daily.    . Vitamins/Minerals TABS Take by mouth.     No current facility-administered medications on file prior to visit.    Allergies:  No Known Allergies  Physical Exam *Limited exam due to visit type*  General: Frail elderly African-Americanlady seated, in no evident distress Head: head normocephalic and atraumatic.  Tongue appears normal without evidence of lesions or discoloration   Neurologic Exam Mental Status: Awake and fully alert.  Normal speech and language Cranial Nerves: Extraocular movements full without nystagmus. Hearing intact to voice.  Mild left lower facial asymmetry, tongue, palate moves normally and symmetrically.  Motor: Chronic left hemiparesis from prior stroke  Sensory.: UTA Coordination: Rapid alternating movements normal in right upper and lower extremities. Finger-to-nose and  heel-to-shin performed accurately on right side Gait and Station: Deferred Reflexes: UTA     ASSESSMENT: 49 year lady with right basal ganglia large infarct of cryptogenic etiology in October 2018 with significant residual spastic left hemiplegia). Vascular risk factors of hypertension hyperlipidemia and smoking.  She is being seen today per family request due to 90-month onset of tongue burning with prior stroke deficit of tongue numbness/tingling    PLAN:  1. 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  Due to recent onset, would recommend obtaining MRI w/wo contrast to rule out new stroke and underlying lesions.  Low suspicion for cranial nerve conditions as she endorses complete tongue burning sensation and not within specific nerve root patterns.  Would also recommend obtaining vitamin B12 level to ensure no deficiency along with obtaining BMP to ensure adequate kidney function in order to receive imaging with contrast.  Recent TSH obtained by PCP satisfactory.  Advised patient/daughters to increase gabapentin dosage from 300 mg nightly to 300 mg twice daily and potentially increase further in 1 to 2 weeks for possible pain improvement.  Encourage use of protein shakes in order to ensure adequate nutrition until pain can be better controlled.  Discussed case with Dr. Jaynee Eagles who agreed with above plan 2. Right BG infarct: Continue aspirin 325 mg daily  and atorvastatin 40 mg for secondary stroke prevention. Maintain strict control of hypertension with blood pressure goal below 130/90, diabetes with hemoglobin A1c goal below 6.5% and cholesterol with LDL cholesterol (bad cholesterol) goal below 70 mg/dL.  I also advised the patient to eat a healthy diet with plenty of whole grains, cereals, fruits and vegetables, exercise regularly with at least 30 minutes of continuous activity daily and maintain ideal body weight.  Continue to monitor loop  recorder 3. HTN: Advised to continue current treatment regimen.  Today's BP 117/76.  Advised to continue to monitor at home along with continued follow-up with PCP for management along with daughter questioning whether antihypertensives can start to be tapered  as requested by daughter 64. HLD: Advised to continue current treatment regimen along with continued follow-up with PCP for future prescribing and monitoring of lipid panel   Follow up in 3 months or call earlier if needed   Greater than 50% of time during this 25 minute nonface-to-face visit was spent on discussing new onset of symptoms and indication for further work-up, possible treatment options, discussion regarding prior stroke and residual deficits, and answering all questions to patient satisfaction along with reviewing plan of care with Dr. Karlyne Greenspan, AGNP-BC  [facility] [address] [place], [place] [address]  Phone [phone] Fax [phone] Note: This document was prepared with digital dictation and possible smart phrase technology. Any transcriptional errors that result from this process are unintentional.

## 2019-07-15 NOTE — Progress Notes (Signed)
I agree with the above plan 

## 2019-07-15 NOTE — Telephone Encounter (Signed)
Called for recent labs (06-16-19 (looks like TSH and CMP) she will fax over.

## 2019-07-28 NOTE — Progress Notes (Signed)
ILR remote 

## 2019-08-05 ENCOUNTER — Ambulatory Visit (INDEPENDENT_AMBULATORY_CARE_PROVIDER_SITE_OTHER): Payer: Medicare Other | Admitting: *Deleted

## 2019-08-05 DIAGNOSIS — I6300 Cerebral infarction due to thrombosis of unspecified precerebral artery: Secondary | ICD-10-CM | POA: Diagnosis not present

## 2019-08-05 LAB — CUP PACEART REMOTE DEVICE CHECK
Date Time Interrogation Session: 20201229084710
Implantable Pulse Generator Implant Date: 20190306

## 2019-08-10 ENCOUNTER — Ambulatory Visit
Admission: RE | Admit: 2019-08-10 | Discharge: 2019-08-10 | Disposition: A | Discharge status: Home / Self Care | Payer: Medicare Other | Source: Ambulatory Visit | Attending: Adult Health | Admitting: Adult Health

## 2019-08-10 ENCOUNTER — Other Ambulatory Visit: Payer: Self-pay | Admitting: Physical Medicine & Rehabilitation

## 2019-08-10 DIAGNOSIS — I6789 Other cerebrovascular disease: Secondary | ICD-10-CM | POA: Diagnosis not present

## 2019-08-10 DIAGNOSIS — K146 Glossodynia: Secondary | ICD-10-CM | POA: Diagnosis not present

## 2019-08-10 MED ORDER — GADOBENATE DIMEGLUMINE 529 MG/ML IV SOLN
10.0000 mL | Freq: Once | INTRAVENOUS | Status: AC | PRN
  Administered 2019-08-10: 10 mL via INTRAVENOUS

## 2019-08-11 ENCOUNTER — Other Ambulatory Visit: Payer: Self-pay | Admitting: Physical Medicine & Rehabilitation

## 2019-08-12 ENCOUNTER — Telehealth: Payer: Self-pay | Admitting: *Deleted

## 2019-08-12 NOTE — Telephone Encounter (Signed)
I spoke to [place], daughter and relayed to her the MR results and she will relay to pt  that recent imaging was unremarkable for acute abnormalities or stroke.

## 2019-08-12 NOTE — Telephone Encounter (Signed)
-----   Message from Frann Rider, NP sent at 08/11/2019 10:56 AM EST ----- Please advise patient/family that recent imaging was unremarkable for acute abnormalities or stroke

## 2019-09-06 ENCOUNTER — Ambulatory Visit (INDEPENDENT_AMBULATORY_CARE_PROVIDER_SITE_OTHER): Payer: Medicare Other | Admitting: *Deleted

## 2019-09-06 DIAGNOSIS — I6300 Cerebral infarction due to thrombosis of unspecified precerebral artery: Secondary | ICD-10-CM

## 2019-09-06 LAB — CUP PACEART REMOTE DEVICE CHECK
Date Time Interrogation Session: 20210201000333
Implantable Pulse Generator Implant Date: 20190306

## 2019-09-06 NOTE — Progress Notes (Signed)
ILR Remote 

## 2019-09-07 ENCOUNTER — Other Ambulatory Visit: Payer: Self-pay

## 2019-09-12 ENCOUNTER — Ambulatory Visit: Payer: Medicare Other | Attending: Internal Medicine

## 2019-09-12 DIAGNOSIS — Z23 Encounter for immunization: Secondary | ICD-10-CM

## 2019-09-12 NOTE — Progress Notes (Signed)
   Covid-19 Vaccination Clinic  Name:  Kelly Lowe    MRN: [id number] DOB: [birth year]  09/12/2019  Kelly Lowe was observed post Covid-19 immunization for 15 minutes without incidence. She was provided with Vaccine Information Sheet and instruction to access the V-Safe system.   Kelly Lowe was instructed to call 911 with any severe reactions post vaccine: Marland Kitchen Difficulty breathing  . Swelling of your face and throat  . A fast heartbeat  . A bad rash all over your body  . Dizziness and weakness    Immunizations Administered    Name Date Dose VIS Date Route   Pfizer COVID-19 Vaccine 09/12/2019  4:39 PM 0.3 mL 07/16/2019 Intramuscular   Manufacturer: [place]   Lot: [id number]   [place]: [id number]

## 2019-09-15 ENCOUNTER — Telehealth: Payer: Self-pay

## 2019-09-15 MED ORDER — GABAPENTIN 300 MG PO CAPS: 300.0000 mg | ORAL_CAPSULE | Freq: Two times a day (BID) | ORAL | 1 refills | Status: DC

## 2019-09-15 NOTE — Telephone Encounter (Signed)
Refill sent called in

## 2019-09-19 ENCOUNTER — Other Ambulatory Visit: Payer: Self-pay | Admitting: Physical Medicine & Rehabilitation

## 2019-09-29 ENCOUNTER — Ambulatory Visit: Payer: Medicare Other

## 2019-10-07 ENCOUNTER — Ambulatory Visit: Payer: Medicare Other | Attending: Internal Medicine

## 2019-10-07 ENCOUNTER — Ambulatory Visit (INDEPENDENT_AMBULATORY_CARE_PROVIDER_SITE_OTHER): Payer: Medicare Other | Admitting: *Deleted

## 2019-10-07 DIAGNOSIS — Z23 Encounter for immunization: Secondary | ICD-10-CM | POA: Insufficient documentation

## 2019-10-07 DIAGNOSIS — I6300 Cerebral infarction due to thrombosis of unspecified precerebral artery: Secondary | ICD-10-CM

## 2019-10-07 LAB — CUP PACEART REMOTE DEVICE CHECK
Date Time Interrogation Session: 20210304025133
Implantable Pulse Generator Implant Date: 20190306

## 2019-10-07 NOTE — Progress Notes (Signed)
ILR Remote 

## 2019-10-07 NOTE — Progress Notes (Signed)
   Covid-19 Vaccination Clinic  Name:  Kelly Lowe    MRN: [id number] DOB: 01-29-1951  10/07/2019  Kelly Lowe was observed post Covid-19 immunization for 15 minutes without incident. She was provided with Vaccine Information Sheet and instruction to access the V-Safe system.   Kelly Lowe was instructed to call 911 with any severe reactions post vaccine: Marland Kitchen Difficulty breathing  . Swelling of face and throat  . A fast heartbeat  . A bad rash all over body  . Dizziness and weakness   Immunizations Administered    Name Date Dose VIS Date Route   Pfizer COVID-19 Vaccine 10/07/2019  5:47 PM 0.3 mL 07/16/2019 Intramuscular   Manufacturer: [place]   Lot: [id number]   [place]: [id number]

## 2019-10-14 ENCOUNTER — Encounter: Payer: Self-pay | Admitting: Physical Medicine & Rehabilitation

## 2019-10-14 ENCOUNTER — Encounter: Payer: Medicare Other | Attending: Physical Medicine & Rehabilitation | Admitting: Physical Medicine & Rehabilitation

## 2019-10-14 ENCOUNTER — Other Ambulatory Visit: Payer: Self-pay

## 2019-10-14 VITALS — BP 144/87 | HR 72 | Temp 98.7°F | Ht 67.0 in | Wt 139.4 lb

## 2019-10-14 DIAGNOSIS — I6300 Cerebral infarction due to thrombosis of unspecified precerebral artery: Secondary | ICD-10-CM

## 2019-10-14 DIAGNOSIS — I69359 Hemiplegia and hemiparesis following cerebral infarction affecting unspecified side: Secondary | ICD-10-CM | POA: Insufficient documentation

## 2019-10-14 DIAGNOSIS — G8110 Spastic hemiplegia affecting unspecified side: Secondary | ICD-10-CM | POA: Diagnosis not present

## 2019-10-14 DIAGNOSIS — I69398 Other sequelae of cerebral infarction: Secondary | ICD-10-CM | POA: Diagnosis not present

## 2019-10-14 DIAGNOSIS — G479 Sleep disorder, unspecified: Secondary | ICD-10-CM | POA: Diagnosis not present

## 2019-10-14 DIAGNOSIS — I69319 Unspecified symptoms and signs involving cognitive functions following cerebral infarction: Secondary | ICD-10-CM | POA: Insufficient documentation

## 2019-10-14 DIAGNOSIS — S43002S Unspecified subluxation of left shoulder joint, sequela: Secondary | ICD-10-CM | POA: Diagnosis not present

## 2019-10-14 DIAGNOSIS — R209 Unspecified disturbances of skin sensation: Secondary | ICD-10-CM | POA: Insufficient documentation

## 2019-10-14 DIAGNOSIS — R269 Unspecified abnormalities of gait and mobility: Secondary | ICD-10-CM | POA: Diagnosis not present

## 2019-10-14 MED ORDER — DULOXETINE HCL 60 MG PO CPEP: ORAL_CAPSULE | ORAL | 0 refills | Status: DC

## 2019-10-14 MED ORDER — GABAPENTIN 300 MG PO CAPS: 300.0000 mg | ORAL_CAPSULE | Freq: Three times a day (TID) | ORAL | 1 refills | Status: DC

## 2019-10-14 MED ORDER — BACLOFEN 10 MG PO TABS: 10.0000 mg | ORAL_TABLET | Freq: Three times a day (TID) | ORAL | 1 refills | Status: DC

## 2019-10-14 NOTE — Progress Notes (Signed)
Subjective:    Patient ID: Kelly Lowe, female    DOB: [birth year], 69 y.o.   MRN: [id number]  HPI  Female with history of HTN, right foot fractures presents for follow for right basal ganglia infarct.   Last clinic visit  12/30/2018. Daughter supplements history. Since that time, pt states she continues HEP.  She is no longer wearing orthosis at nights. She is now living in her own home. She continues to take Gabapentin. She ran out of Baclofen, but has not noticed much difference. Denies falls. Sleep is fair. Patient's sister passed away in 2019/07/21 and she is very upset.  She continues to take Cymbalta. She notes burning sensation in her mouth.  She is seeing Psychology. She was seen by Ortho and told she had atrophy and subluxation. No issues with bone spur on wrist. She denies numbness.   Pain Inventory Average Pain 2 Pain Right Now 2 My pain is intermittent and dull  In the last 24 hours, has pain interfered with the following? General activity 0 Relation with others 0 Enjoyment of life 0 What TIME of day is your pain at its worst? evening Sleep (in general) Fair  Pain is worse with: bending and some activites Pain improves with: heat/ice and therapy/exercise Relief from Meds: n/a  Mobility walk with assistance use a cane how many minutes can you walk? 5-10 ability to climb steps?  yes do you drive?  no Do you have any goals in this area?  yes  Function retired I need assistance with the following:  meal prep, household duties and shopping  Neuro/Psych tingling trouble walking spasms dizziness depression loss of taste or smell  Prior Studies Any changes since last visit?  no  Physicians involved in your care Any changes since last visit?  no Primary care [facility]   Family History  Problem Relation Age of Onset  . Stroke Maternal Grandmother   . Stroke Paternal Grandfather    Social History   Socioeconomic History  . Marital status: Divorced   Spouse name: Not on file  . Number of children: Not on file  . Years of education: Not on file  . Highest education level: Not on file  Occupational History  . Not on file  Tobacco Use  . Smoking status: Former Smoker    Packs/day: 0.50    Years: 25.00    Pack years: 12.50    Types: Cigarettes    Quit date: 05/02/2017    Years since quitting: 2.4  . Smokeless tobacco: Never Used  Substance and Sexual Activity  . Alcohol use: No    Alcohol/week: 1.0 standard drinks    Types: 1 Glasses of wine per week    Comment: social  . Drug use: No  . Sexual activity: Never  Other Topics Concern  . Not on file  Social History Narrative  . Not on file   Social Determinants of Health   Financial Resource Strain:   . Difficulty of Paying Living Expenses:   Food Insecurity:   . Worried About Charity fundraiser in the Last Year:   . Arboriculturist in the Last Year:   Transportation Needs:   . Film/video editor (Medical):   Marland Kitchen Lack of Transportation (Non-Medical):   Physical Activity:   . Days of Exercise per Week:   . Minutes of Exercise per Session:   Stress:   . Feeling of Stress :   Social Connections:   . Frequency of  Communication with Friends and Family:   . Frequency of Social Gatherings with Friends and Family:   . Attends Religious Services:   . Active Member of Clubs or Organizations:   . Attends Archivist Meetings:   Marland Kitchen Marital Status:    Past Surgical History:  Procedure Laterality Date  . FOOT SURGERY    . LOOP RECORDER INSERTION N/A 10/08/2017   Procedure: LOOP RECORDER INSERTION;  Surgeon: Deboraha Sprang, MD;  Location: [place] [facility];  Service: Cardiovascular;  Laterality: N/A;  . TEE WITHOUT CARDIOVERSION N/A 05/08/2017   Procedure: TRANSESOPHAGEAL ECHOCARDIOGRAM (TEE);  Surgeon: Lelon Perla, MD;  Location: [facility] ENDOSCOPY;  Service: Cardiovascular;  Laterality: N/A;   Past Medical History:  Diagnosis Date  . Hyperlipidemia   .  Hypertension   . Stroke ([place])    BP (!) 144/87   Pulse 72   Temp 98.7 F (37.1 C)   Ht 5\' 7"  (1.702 m)   Wt 139 lb 6.4 oz (63.2 kg)   SpO2 (!) 72%   BMI 21.83 kg/m   Opioid Risk Score:   Fall Risk Score:  `1  Depression screen PHQ 2/9  Depression screen [facility] 2/9 12/30/2018 04/09/2018 01/09/2018 11/28/2017 11/13/2017 06/19/2017 06/19/2017  Decreased Interest 0 0 0 1 0 0 0  Down, Depressed, Hopeless 0 0 0 1 2 0 0  PHQ - 2 Score 0 0 0 2 2 0 0  Altered sleeping - - - 1 1 3  -  Tired, decreased energy - - - 1 0 2 -  Change in appetite - - - 1 0 1 -  Feeling bad or failure about yourself  - - - 1 1 1  -  Trouble concentrating - - - 1 0 1 -  Moving slowly or fidgety/restless - - - 1 1 1  -  Suicidal thoughts - - - 0 0 0 -  PHQ-9 Score - - - 8 5 9  -  Difficult doing work/chores - - - Somewhat difficult Somewhat difficult Not difficult at all -    Review of Systems  HENT: Negative.   Eyes: Negative.   Respiratory: Negative.   Cardiovascular: Negative.   Gastrointestinal: Positive for constipation.       Poor appetite  Endocrine: Negative.   Genitourinary: Negative.   Musculoskeletal: Positive for arthralgias.       Spasms  Skin: Positive for rash.  Allergic/Immunologic: Negative.   Neurological: Positive for weakness.       Tingling   Hematological: Negative.   Psychiatric/Behavioral: The patient is nervous/anxious.        Mother passed away and is very emotional  All other systems reviewed and are negative.     Objective:   Physical Exam  Constitutional: NAD. HENT: Normocephalic and atraumatic.  Musculoskeletal: Left shoulder subluxation  Psych: Tearful Neurological: Alert Motor: LUE: 2+/5 shoulder abduction, elbow flex 3+-4+-/5, elbow ext 2+/5, hand grip 4/5 with apraxia LLE: 4-/5 HF, 4/5 KE, ADF/PF 2/5  mAS left elbow flexors: 2/4, wrist flexors 3/4, finger flexors 3/4 Left ADF/PF 1+/4 Follows commands.  Insight and awareness into deficits.     Assessment & Plan:    Femalewith history of HTN, right foot fractures presents for follow for right basal ganglia infarct.   1. Left spastic hemiparesis, fatigue, and functional deficitssecondary to right basal ganglia infarct             Continue HEP             No benefit  with amantadine             Cont WHO/PRAFO, encouraged compliance             Will resume Dysport injections, significant increase in tone with trial of being off medications  Dysport on next visit:               Left biceps300U              Left FCR 100U               Left FCU 100U              Left FDS 200U              Left FDP 200U    Left Med Gastroc 150   Left Lat Gastroc 150              Will increase Gabapentin to 300 TID for numbness             Will increase Baclofen to 10 TID  2. Gait abnormality             Continue cane for safety  3. Sleep disturbance             Fair  4. Mood lability:             Patient, now benefiting with Psychology             Cont Cymbalta 60mg               Nudexta ordered by Neurology - but cannot afford             Encouraged follow up with Psychiatry  5. Left shoulder subluxation with pain             Kinesio taping on hold due to reaction to tape             SPRINT placed on 9/5, removed on 11/6             Tramadol per PCP (educated on signs/symptoms serotonin syndrome)             Ortho consult ordered by Neurology NP - recommended therapies, which she completed  6. Bony prominence right wrist             Xray with bony prominence             Voltaren gel in effective             Good benefits with injection on 9/26  7. Right hand numbness             Managed at present             Positional  Improved  > 50 minutes spent with patient and daughter with >45 minutes in counseling regarding stroke, treatment options, disability, oral pain, etc.

## 2019-10-18 ENCOUNTER — Telehealth: Payer: Self-pay

## 2019-10-18 NOTE — Telephone Encounter (Signed)
Patient called requesting referral for physical therapy

## 2019-10-22 ENCOUNTER — Telehealth: Payer: Self-pay | Admitting: Physical Medicine & Rehabilitation

## 2019-10-22 NOTE — Telephone Encounter (Signed)
Patient is asking for a letter stating her current condition and diagnosis to turn in with her disability application.

## 2019-10-25 DIAGNOSIS — E785 Hyperlipidemia, unspecified: Secondary | ICD-10-CM | POA: Diagnosis not present

## 2019-10-25 DIAGNOSIS — I63531 Cerebral infarction due to unspecified occlusion or stenosis of right posterior cerebral artery: Secondary | ICD-10-CM | POA: Diagnosis not present

## 2019-10-25 DIAGNOSIS — F4321 Adjustment disorder with depressed mood: Secondary | ICD-10-CM | POA: Diagnosis not present

## 2019-10-25 DIAGNOSIS — I10 Essential (primary) hypertension: Secondary | ICD-10-CM | POA: Diagnosis not present

## 2019-10-25 DIAGNOSIS — I639 Cerebral infarction, unspecified: Secondary | ICD-10-CM | POA: Diagnosis not present

## 2019-10-25 DIAGNOSIS — G4700 Insomnia, unspecified: Secondary | ICD-10-CM | POA: Diagnosis not present

## 2019-10-25 NOTE — Telephone Encounter (Signed)
contacted patient. she asked [place] to mail her the letter. Address confirmed. Letter prepared and mailed

## 2019-10-25 NOTE — Telephone Encounter (Signed)
Brief statement written.

## 2019-10-25 NOTE — Telephone Encounter (Signed)
I will make a referral after she has Botox injection to optimize functionality.  Thanks.

## 2019-10-26 DIAGNOSIS — M79672 Pain in left foot: Secondary | ICD-10-CM | POA: Diagnosis not present

## 2019-10-26 DIAGNOSIS — L84 Corns and callosities: Secondary | ICD-10-CM | POA: Diagnosis not present

## 2019-10-26 DIAGNOSIS — D2372 Other benign neoplasm of skin of left lower limb, including hip: Secondary | ICD-10-CM | POA: Diagnosis not present

## 2019-10-26 DIAGNOSIS — M21372 Foot drop, left foot: Secondary | ICD-10-CM | POA: Diagnosis not present

## 2019-10-26 DIAGNOSIS — R2689 Other abnormalities of gait and mobility: Secondary | ICD-10-CM | POA: Diagnosis not present

## 2019-10-26 DIAGNOSIS — B351 Tinea unguium: Secondary | ICD-10-CM | POA: Diagnosis not present

## 2019-10-26 DIAGNOSIS — G608 Other hereditary and idiopathic neuropathies: Secondary | ICD-10-CM | POA: Diagnosis not present

## 2019-11-05 DIAGNOSIS — B351 Tinea unguium: Secondary | ICD-10-CM | POA: Diagnosis not present

## 2019-11-05 DIAGNOSIS — D2372 Other benign neoplasm of skin of left lower limb, including hip: Secondary | ICD-10-CM | POA: Diagnosis not present

## 2019-11-05 DIAGNOSIS — Z7982 Long term (current) use of aspirin: Secondary | ICD-10-CM | POA: Diagnosis not present

## 2019-11-05 DIAGNOSIS — I69354 Hemiplegia and hemiparesis following cerebral infarction affecting left non-dominant side: Secondary | ICD-10-CM | POA: Diagnosis not present

## 2019-11-05 DIAGNOSIS — M21372 Foot drop, left foot: Secondary | ICD-10-CM | POA: Diagnosis not present

## 2019-11-05 DIAGNOSIS — G608 Other hereditary and idiopathic neuropathies: Secondary | ICD-10-CM | POA: Diagnosis not present

## 2019-11-05 DIAGNOSIS — M79672 Pain in left foot: Secondary | ICD-10-CM | POA: Diagnosis not present

## 2019-11-05 DIAGNOSIS — L84 Corns and callosities: Secondary | ICD-10-CM | POA: Diagnosis not present

## 2019-11-05 DIAGNOSIS — I10 Essential (primary) hypertension: Secondary | ICD-10-CM | POA: Diagnosis not present

## 2019-11-08 ENCOUNTER — Ambulatory Visit (INDEPENDENT_AMBULATORY_CARE_PROVIDER_SITE_OTHER): Payer: Medicare Other | Admitting: *Deleted

## 2019-11-08 DIAGNOSIS — I6300 Cerebral infarction due to thrombosis of unspecified precerebral artery: Secondary | ICD-10-CM | POA: Diagnosis not present

## 2019-11-08 LAB — CUP PACEART REMOTE DEVICE CHECK
Date Time Interrogation Session: 20210404035415
Implantable Pulse Generator Implant Date: 20190306

## 2019-11-09 NOTE — Progress Notes (Signed)
ILR Remote 

## 2019-11-11 DIAGNOSIS — G608 Other hereditary and idiopathic neuropathies: Secondary | ICD-10-CM | POA: Diagnosis not present

## 2019-11-11 DIAGNOSIS — M79672 Pain in left foot: Secondary | ICD-10-CM | POA: Diagnosis not present

## 2019-11-11 DIAGNOSIS — I69354 Hemiplegia and hemiparesis following cerebral infarction affecting left non-dominant side: Secondary | ICD-10-CM | POA: Diagnosis not present

## 2019-11-11 DIAGNOSIS — M21372 Foot drop, left foot: Secondary | ICD-10-CM | POA: Diagnosis not present

## 2019-11-11 DIAGNOSIS — I10 Essential (primary) hypertension: Secondary | ICD-10-CM | POA: Diagnosis not present

## 2019-11-11 DIAGNOSIS — L84 Corns and callosities: Secondary | ICD-10-CM | POA: Diagnosis not present

## 2019-11-15 DIAGNOSIS — M79672 Pain in left foot: Secondary | ICD-10-CM | POA: Diagnosis not present

## 2019-11-15 DIAGNOSIS — M21372 Foot drop, left foot: Secondary | ICD-10-CM | POA: Diagnosis not present

## 2019-11-15 DIAGNOSIS — G608 Other hereditary and idiopathic neuropathies: Secondary | ICD-10-CM | POA: Diagnosis not present

## 2019-11-15 DIAGNOSIS — I10 Essential (primary) hypertension: Secondary | ICD-10-CM | POA: Diagnosis not present

## 2019-11-15 DIAGNOSIS — I69354 Hemiplegia and hemiparesis following cerebral infarction affecting left non-dominant side: Secondary | ICD-10-CM | POA: Diagnosis not present

## 2019-11-15 DIAGNOSIS — L84 Corns and callosities: Secondary | ICD-10-CM | POA: Diagnosis not present

## 2019-11-18 DIAGNOSIS — Z20828 Contact with and (suspected) exposure to other viral communicable diseases: Secondary | ICD-10-CM | POA: Diagnosis not present

## 2019-11-18 DIAGNOSIS — Z03818 Encounter for observation for suspected exposure to other biological agents ruled out: Secondary | ICD-10-CM | POA: Diagnosis not present

## 2019-11-19 DIAGNOSIS — I10 Essential (primary) hypertension: Secondary | ICD-10-CM | POA: Diagnosis not present

## 2019-11-19 DIAGNOSIS — M21372 Foot drop, left foot: Secondary | ICD-10-CM | POA: Diagnosis not present

## 2019-11-19 DIAGNOSIS — I69354 Hemiplegia and hemiparesis following cerebral infarction affecting left non-dominant side: Secondary | ICD-10-CM | POA: Diagnosis not present

## 2019-11-19 DIAGNOSIS — L84 Corns and callosities: Secondary | ICD-10-CM | POA: Diagnosis not present

## 2019-11-19 DIAGNOSIS — M79672 Pain in left foot: Secondary | ICD-10-CM | POA: Diagnosis not present

## 2019-11-19 DIAGNOSIS — G608 Other hereditary and idiopathic neuropathies: Secondary | ICD-10-CM | POA: Diagnosis not present

## 2019-11-22 ENCOUNTER — Encounter: Payer: Medicare Other | Attending: Physical Medicine & Rehabilitation | Admitting: Physical Medicine & Rehabilitation

## 2019-11-22 ENCOUNTER — Other Ambulatory Visit: Payer: Self-pay

## 2019-11-22 ENCOUNTER — Encounter: Payer: Self-pay | Admitting: Physical Medicine & Rehabilitation

## 2019-11-22 VITALS — BP 126/79 | HR 77 | Temp 96.9°F | Ht 67.0 in | Wt 140.0 lb

## 2019-11-22 DIAGNOSIS — I69359 Hemiplegia and hemiparesis following cerebral infarction affecting unspecified side: Secondary | ICD-10-CM | POA: Insufficient documentation

## 2019-11-22 DIAGNOSIS — G479 Sleep disorder, unspecified: Secondary | ICD-10-CM | POA: Diagnosis not present

## 2019-11-22 DIAGNOSIS — G8110 Spastic hemiplegia affecting unspecified side: Secondary | ICD-10-CM | POA: Insufficient documentation

## 2019-11-22 DIAGNOSIS — S43002S Unspecified subluxation of left shoulder joint, sequela: Secondary | ICD-10-CM | POA: Diagnosis not present

## 2019-11-22 DIAGNOSIS — R269 Unspecified abnormalities of gait and mobility: Secondary | ICD-10-CM | POA: Insufficient documentation

## 2019-11-22 DIAGNOSIS — I69319 Unspecified symptoms and signs involving cognitive functions following cerebral infarction: Secondary | ICD-10-CM | POA: Insufficient documentation

## 2019-11-22 DIAGNOSIS — I69398 Other sequelae of cerebral infarction: Secondary | ICD-10-CM | POA: Insufficient documentation

## 2019-11-22 DIAGNOSIS — R209 Unspecified disturbances of skin sensation: Secondary | ICD-10-CM | POA: Insufficient documentation

## 2019-11-22 NOTE — Progress Notes (Signed)
Dysport: Procedure Note Patient Name: [name] DOB: [birth year] MRN: [id number] Date: 11/22/19  Procedure: Botulinum toxin administration Guidance: EMG Diagnosis: Left spastic hemiparesis secondary to right basal ganglia infarct Attending: Delice Lesch, MD   Informed consent: Risks, benefits & options of the procedure are explained to the patient (and/or family). The patient elects to proceed with procedure. Risks include but are not limited to weakness, respiratory distress, dry mouth, ptosis, antibody formation, worsening of some areas of function. Benefits include decreased abnormal muscle tone, improved hygiene and positioning, decreased skin breakdown and, in some cases, decreased pain. Options include conservative management with oral antispasticity agents, phenol chemodenervation of nerve or at motor nerve branches. More invasive options include intrathecal balcofen adminstration for appropriate candidates. Surgical options may include tendon lengthening or transposition or, rarely, dorsal rhizotomy.   History/Physical Examination: 69 y.o. femalewith history of HTN, right foot fractures presents for follow for right basal ganglia infarct.   mAS: left  elbow flexors: 1+/4  wrist flexors 1+/4  finger flexors 2/4  Left ADF/PF 1+/4  Previous Treatments: Therapy/Range of motion Indication for guidance: Target active muscules  Procedure: Botulinum toxin was mixed with preservative free saline with a dilution of 0.5cc to 100 units. Targeted limb and muscles were identified. The skin was prepped with alcohol swabs and placement of needle tip in targeted muscle was confirmed using appropriate guidance. Prior to injection, positioning of needle tip outside of blood vessel was determined by pulling back on syringe plunger.  MUSCLE UNITS Dysport:  Left biceps300U  Left FCR 100U   Left FCU 100U  Left FDS 200U  Left FDP 200U   Left Med Gastroc 150  Left Lat Gastroc 150   Total  units used: 0000000 Units  Complications: None  Plan: 6 weeks  Arihant Lowe Kelly Lowe 11:03 AM

## 2019-11-24 DIAGNOSIS — M79672 Pain in left foot: Secondary | ICD-10-CM | POA: Diagnosis not present

## 2019-11-24 DIAGNOSIS — G608 Other hereditary and idiopathic neuropathies: Secondary | ICD-10-CM | POA: Diagnosis not present

## 2019-11-24 DIAGNOSIS — L84 Corns and callosities: Secondary | ICD-10-CM | POA: Diagnosis not present

## 2019-11-24 DIAGNOSIS — I69354 Hemiplegia and hemiparesis following cerebral infarction affecting left non-dominant side: Secondary | ICD-10-CM | POA: Diagnosis not present

## 2019-11-24 DIAGNOSIS — I10 Essential (primary) hypertension: Secondary | ICD-10-CM | POA: Diagnosis not present

## 2019-11-24 DIAGNOSIS — M21372 Foot drop, left foot: Secondary | ICD-10-CM | POA: Diagnosis not present

## 2019-11-25 DIAGNOSIS — M21372 Foot drop, left foot: Secondary | ICD-10-CM | POA: Diagnosis not present

## 2019-11-25 DIAGNOSIS — M79672 Pain in left foot: Secondary | ICD-10-CM | POA: Diagnosis not present

## 2019-11-25 DIAGNOSIS — L84 Corns and callosities: Secondary | ICD-10-CM | POA: Diagnosis not present

## 2019-11-25 DIAGNOSIS — G608 Other hereditary and idiopathic neuropathies: Secondary | ICD-10-CM | POA: Diagnosis not present

## 2019-11-25 DIAGNOSIS — I69354 Hemiplegia and hemiparesis following cerebral infarction affecting left non-dominant side: Secondary | ICD-10-CM | POA: Diagnosis not present

## 2019-11-25 DIAGNOSIS — I10 Essential (primary) hypertension: Secondary | ICD-10-CM | POA: Diagnosis not present

## 2019-11-30 DIAGNOSIS — M21372 Foot drop, left foot: Secondary | ICD-10-CM | POA: Diagnosis not present

## 2019-11-30 DIAGNOSIS — I10 Essential (primary) hypertension: Secondary | ICD-10-CM | POA: Diagnosis not present

## 2019-11-30 DIAGNOSIS — G608 Other hereditary and idiopathic neuropathies: Secondary | ICD-10-CM | POA: Diagnosis not present

## 2019-11-30 DIAGNOSIS — M79672 Pain in left foot: Secondary | ICD-10-CM | POA: Diagnosis not present

## 2019-11-30 DIAGNOSIS — L84 Corns and callosities: Secondary | ICD-10-CM | POA: Diagnosis not present

## 2019-11-30 DIAGNOSIS — I69354 Hemiplegia and hemiparesis following cerebral infarction affecting left non-dominant side: Secondary | ICD-10-CM | POA: Diagnosis not present

## 2019-12-02 DIAGNOSIS — M21372 Foot drop, left foot: Secondary | ICD-10-CM | POA: Diagnosis not present

## 2019-12-02 DIAGNOSIS — I10 Essential (primary) hypertension: Secondary | ICD-10-CM | POA: Diagnosis not present

## 2019-12-02 DIAGNOSIS — L84 Corns and callosities: Secondary | ICD-10-CM | POA: Diagnosis not present

## 2019-12-02 DIAGNOSIS — M79672 Pain in left foot: Secondary | ICD-10-CM | POA: Diagnosis not present

## 2019-12-02 DIAGNOSIS — G608 Other hereditary and idiopathic neuropathies: Secondary | ICD-10-CM | POA: Diagnosis not present

## 2019-12-02 DIAGNOSIS — I69354 Hemiplegia and hemiparesis following cerebral infarction affecting left non-dominant side: Secondary | ICD-10-CM | POA: Diagnosis not present

## 2019-12-05 DIAGNOSIS — M79672 Pain in left foot: Secondary | ICD-10-CM | POA: Diagnosis not present

## 2019-12-05 DIAGNOSIS — I69354 Hemiplegia and hemiparesis following cerebral infarction affecting left non-dominant side: Secondary | ICD-10-CM | POA: Diagnosis not present

## 2019-12-05 DIAGNOSIS — I10 Essential (primary) hypertension: Secondary | ICD-10-CM | POA: Diagnosis not present

## 2019-12-05 DIAGNOSIS — M21372 Foot drop, left foot: Secondary | ICD-10-CM | POA: Diagnosis not present

## 2019-12-05 DIAGNOSIS — L84 Corns and callosities: Secondary | ICD-10-CM | POA: Diagnosis not present

## 2019-12-05 DIAGNOSIS — B351 Tinea unguium: Secondary | ICD-10-CM | POA: Diagnosis not present

## 2019-12-05 DIAGNOSIS — G608 Other hereditary and idiopathic neuropathies: Secondary | ICD-10-CM | POA: Diagnosis not present

## 2019-12-05 DIAGNOSIS — D2372 Other benign neoplasm of skin of left lower limb, including hip: Secondary | ICD-10-CM | POA: Diagnosis not present

## 2019-12-05 DIAGNOSIS — Z7982 Long term (current) use of aspirin: Secondary | ICD-10-CM | POA: Diagnosis not present

## 2019-12-06 DIAGNOSIS — M79672 Pain in left foot: Secondary | ICD-10-CM | POA: Diagnosis not present

## 2019-12-06 DIAGNOSIS — L84 Corns and callosities: Secondary | ICD-10-CM | POA: Diagnosis not present

## 2019-12-06 DIAGNOSIS — M21372 Foot drop, left foot: Secondary | ICD-10-CM | POA: Diagnosis not present

## 2019-12-06 DIAGNOSIS — I10 Essential (primary) hypertension: Secondary | ICD-10-CM | POA: Diagnosis not present

## 2019-12-06 DIAGNOSIS — G608 Other hereditary and idiopathic neuropathies: Secondary | ICD-10-CM | POA: Diagnosis not present

## 2019-12-06 DIAGNOSIS — I69354 Hemiplegia and hemiparesis following cerebral infarction affecting left non-dominant side: Secondary | ICD-10-CM | POA: Diagnosis not present

## 2019-12-08 ENCOUNTER — Other Ambulatory Visit: Payer: Self-pay | Admitting: *Deleted

## 2019-12-08 MED ORDER — GABAPENTIN 300 MG PO CAPS: 300.0000 mg | ORAL_CAPSULE | Freq: Three times a day (TID) | ORAL | 1 refills | Status: DC

## 2019-12-09 DIAGNOSIS — M79672 Pain in left foot: Secondary | ICD-10-CM | POA: Diagnosis not present

## 2019-12-09 DIAGNOSIS — I69354 Hemiplegia and hemiparesis following cerebral infarction affecting left non-dominant side: Secondary | ICD-10-CM | POA: Diagnosis not present

## 2019-12-09 DIAGNOSIS — L84 Corns and callosities: Secondary | ICD-10-CM | POA: Diagnosis not present

## 2019-12-09 DIAGNOSIS — M21372 Foot drop, left foot: Secondary | ICD-10-CM | POA: Diagnosis not present

## 2019-12-09 DIAGNOSIS — I10 Essential (primary) hypertension: Secondary | ICD-10-CM | POA: Diagnosis not present

## 2019-12-09 DIAGNOSIS — G608 Other hereditary and idiopathic neuropathies: Secondary | ICD-10-CM | POA: Diagnosis not present

## 2019-12-09 LAB — CUP PACEART REMOTE DEVICE CHECK
Date Time Interrogation Session: 20210505035830
Implantable Pulse Generator Implant Date: 20190306

## 2019-12-13 ENCOUNTER — Ambulatory Visit (INDEPENDENT_AMBULATORY_CARE_PROVIDER_SITE_OTHER): Payer: Medicare Other | Admitting: *Deleted

## 2019-12-13 DIAGNOSIS — I10 Essential (primary) hypertension: Secondary | ICD-10-CM | POA: Diagnosis not present

## 2019-12-13 DIAGNOSIS — G608 Other hereditary and idiopathic neuropathies: Secondary | ICD-10-CM | POA: Diagnosis not present

## 2019-12-13 DIAGNOSIS — I69354 Hemiplegia and hemiparesis following cerebral infarction affecting left non-dominant side: Secondary | ICD-10-CM | POA: Diagnosis not present

## 2019-12-13 DIAGNOSIS — M79672 Pain in left foot: Secondary | ICD-10-CM | POA: Diagnosis not present

## 2019-12-13 DIAGNOSIS — I6300 Cerebral infarction due to thrombosis of unspecified precerebral artery: Secondary | ICD-10-CM | POA: Diagnosis not present

## 2019-12-13 DIAGNOSIS — M21372 Foot drop, left foot: Secondary | ICD-10-CM | POA: Diagnosis not present

## 2019-12-13 DIAGNOSIS — L84 Corns and callosities: Secondary | ICD-10-CM | POA: Diagnosis not present

## 2019-12-14 NOTE — Progress Notes (Signed)
[facility] Summary Report / Loop Recorder 

## 2019-12-16 DIAGNOSIS — L84 Corns and callosities: Secondary | ICD-10-CM | POA: Diagnosis not present

## 2019-12-16 DIAGNOSIS — M79672 Pain in left foot: Secondary | ICD-10-CM | POA: Diagnosis not present

## 2019-12-16 DIAGNOSIS — M21372 Foot drop, left foot: Secondary | ICD-10-CM | POA: Diagnosis not present

## 2019-12-16 DIAGNOSIS — G608 Other hereditary and idiopathic neuropathies: Secondary | ICD-10-CM | POA: Diagnosis not present

## 2019-12-16 DIAGNOSIS — I69354 Hemiplegia and hemiparesis following cerebral infarction affecting left non-dominant side: Secondary | ICD-10-CM | POA: Diagnosis not present

## 2019-12-16 DIAGNOSIS — I10 Essential (primary) hypertension: Secondary | ICD-10-CM | POA: Diagnosis not present

## 2019-12-18 ENCOUNTER — Other Ambulatory Visit: Payer: Self-pay | Admitting: Physical Medicine & Rehabilitation

## 2019-12-20 DIAGNOSIS — I10 Essential (primary) hypertension: Secondary | ICD-10-CM | POA: Diagnosis not present

## 2019-12-20 DIAGNOSIS — L84 Corns and callosities: Secondary | ICD-10-CM | POA: Diagnosis not present

## 2019-12-20 DIAGNOSIS — M79672 Pain in left foot: Secondary | ICD-10-CM | POA: Diagnosis not present

## 2019-12-20 DIAGNOSIS — I69354 Hemiplegia and hemiparesis following cerebral infarction affecting left non-dominant side: Secondary | ICD-10-CM | POA: Diagnosis not present

## 2019-12-20 DIAGNOSIS — M21372 Foot drop, left foot: Secondary | ICD-10-CM | POA: Diagnosis not present

## 2019-12-20 DIAGNOSIS — G608 Other hereditary and idiopathic neuropathies: Secondary | ICD-10-CM | POA: Diagnosis not present

## 2019-12-22 ENCOUNTER — Other Ambulatory Visit: Payer: Self-pay | Admitting: *Deleted

## 2019-12-22 MED ORDER — BACLOFEN 10 MG PO TABS: 10.0000 mg | ORAL_TABLET | Freq: Three times a day (TID) | ORAL | 0 refills | Status: DC

## 2019-12-22 MED ORDER — GABAPENTIN 300 MG PO CAPS: 300.0000 mg | ORAL_CAPSULE | Freq: Three times a day (TID) | ORAL | 0 refills | Status: DC

## 2019-12-23 DIAGNOSIS — M79672 Pain in left foot: Secondary | ICD-10-CM | POA: Diagnosis not present

## 2019-12-23 DIAGNOSIS — I10 Essential (primary) hypertension: Secondary | ICD-10-CM | POA: Diagnosis not present

## 2019-12-23 DIAGNOSIS — M21372 Foot drop, left foot: Secondary | ICD-10-CM | POA: Diagnosis not present

## 2019-12-23 DIAGNOSIS — I69354 Hemiplegia and hemiparesis following cerebral infarction affecting left non-dominant side: Secondary | ICD-10-CM | POA: Diagnosis not present

## 2019-12-23 DIAGNOSIS — L84 Corns and callosities: Secondary | ICD-10-CM | POA: Diagnosis not present

## 2019-12-23 DIAGNOSIS — G608 Other hereditary and idiopathic neuropathies: Secondary | ICD-10-CM | POA: Diagnosis not present

## 2019-12-28 DIAGNOSIS — M79672 Pain in left foot: Secondary | ICD-10-CM | POA: Diagnosis not present

## 2019-12-28 DIAGNOSIS — M21372 Foot drop, left foot: Secondary | ICD-10-CM | POA: Diagnosis not present

## 2019-12-28 DIAGNOSIS — B351 Tinea unguium: Secondary | ICD-10-CM | POA: Diagnosis not present

## 2019-12-28 DIAGNOSIS — L84 Corns and callosities: Secondary | ICD-10-CM | POA: Diagnosis not present

## 2019-12-28 DIAGNOSIS — R2689 Other abnormalities of gait and mobility: Secondary | ICD-10-CM | POA: Diagnosis not present

## 2019-12-28 DIAGNOSIS — G608 Other hereditary and idiopathic neuropathies: Secondary | ICD-10-CM | POA: Diagnosis not present

## 2019-12-28 DIAGNOSIS — D2372 Other benign neoplasm of skin of left lower limb, including hip: Secondary | ICD-10-CM | POA: Diagnosis not present

## 2019-12-29 DIAGNOSIS — M79672 Pain in left foot: Secondary | ICD-10-CM | POA: Diagnosis not present

## 2019-12-29 DIAGNOSIS — L84 Corns and callosities: Secondary | ICD-10-CM | POA: Diagnosis not present

## 2019-12-29 DIAGNOSIS — I69354 Hemiplegia and hemiparesis following cerebral infarction affecting left non-dominant side: Secondary | ICD-10-CM | POA: Diagnosis not present

## 2019-12-29 DIAGNOSIS — M21372 Foot drop, left foot: Secondary | ICD-10-CM | POA: Diagnosis not present

## 2019-12-29 DIAGNOSIS — I10 Essential (primary) hypertension: Secondary | ICD-10-CM | POA: Diagnosis not present

## 2019-12-29 DIAGNOSIS — G608 Other hereditary and idiopathic neuropathies: Secondary | ICD-10-CM | POA: Diagnosis not present

## 2019-12-30 DIAGNOSIS — I10 Essential (primary) hypertension: Secondary | ICD-10-CM | POA: Diagnosis not present

## 2019-12-30 DIAGNOSIS — G608 Other hereditary and idiopathic neuropathies: Secondary | ICD-10-CM | POA: Diagnosis not present

## 2019-12-30 DIAGNOSIS — L84 Corns and callosities: Secondary | ICD-10-CM | POA: Diagnosis not present

## 2019-12-30 DIAGNOSIS — M79672 Pain in left foot: Secondary | ICD-10-CM | POA: Diagnosis not present

## 2019-12-30 DIAGNOSIS — M21372 Foot drop, left foot: Secondary | ICD-10-CM | POA: Diagnosis not present

## 2019-12-30 DIAGNOSIS — I69354 Hemiplegia and hemiparesis following cerebral infarction affecting left non-dominant side: Secondary | ICD-10-CM | POA: Diagnosis not present

## 2020-01-04 ENCOUNTER — Encounter: Payer: Self-pay | Admitting: Physical Medicine & Rehabilitation

## 2020-01-04 ENCOUNTER — Encounter: Payer: Medicare Other | Attending: Physical Medicine & Rehabilitation | Admitting: Physical Medicine & Rehabilitation

## 2020-01-04 ENCOUNTER — Other Ambulatory Visit: Payer: Self-pay

## 2020-01-04 VITALS — BP 145/91 | HR 85 | Temp 97.2°F | Ht 67.0 in | Wt 135.2 lb

## 2020-01-04 DIAGNOSIS — I69359 Hemiplegia and hemiparesis following cerebral infarction affecting unspecified side: Secondary | ICD-10-CM

## 2020-01-04 DIAGNOSIS — G479 Sleep disorder, unspecified: Secondary | ICD-10-CM | POA: Insufficient documentation

## 2020-01-04 DIAGNOSIS — R209 Unspecified disturbances of skin sensation: Secondary | ICD-10-CM | POA: Diagnosis present

## 2020-01-04 DIAGNOSIS — R3915 Urgency of urination: Secondary | ICD-10-CM | POA: Diagnosis not present

## 2020-01-04 DIAGNOSIS — I6300 Cerebral infarction due to thrombosis of unspecified precerebral artery: Secondary | ICD-10-CM

## 2020-01-04 DIAGNOSIS — I69398 Other sequelae of cerebral infarction: Secondary | ICD-10-CM | POA: Diagnosis present

## 2020-01-04 DIAGNOSIS — I69319 Unspecified symptoms and signs involving cognitive functions following cerebral infarction: Secondary | ICD-10-CM

## 2020-01-04 DIAGNOSIS — R269 Unspecified abnormalities of gait and mobility: Secondary | ICD-10-CM

## 2020-01-04 DIAGNOSIS — S43002A Unspecified subluxation of left shoulder joint, initial encounter: Secondary | ICD-10-CM | POA: Insufficient documentation

## 2020-01-04 DIAGNOSIS — S43002D Unspecified subluxation of left shoulder joint, subsequent encounter: Secondary | ICD-10-CM | POA: Diagnosis not present

## 2020-01-04 DIAGNOSIS — S43002S Unspecified subluxation of left shoulder joint, sequela: Secondary | ICD-10-CM

## 2020-01-04 DIAGNOSIS — G8110 Spastic hemiplegia affecting unspecified side: Secondary | ICD-10-CM | POA: Diagnosis not present

## 2020-01-04 MED ORDER — GABAPENTIN 600 MG PO TABS
600.0000 mg | ORAL_TABLET | Freq: Every day | ORAL | 1 refills | Status: DC
Start: 2020-01-04 — End: 2020-04-07

## 2020-01-04 NOTE — Progress Notes (Signed)
Subjective:    Patient ID: Kelly Lowe, female    DOB: Feb 18, 1951, 69 y.o.   MRN: [id number]  HPI  Female with history of HTN, right foot fractures presents for follow for right basal ganglia infarct.   Last clinic visit on 11/22/2019.  Daughter supplements history.  She had Dysport injection at that time.  Since that time, daughter states improvement with ADLs with encouragement. She is seeing Psychiatrist and was prescribed medications. She is not wearing her WHO/PRAFO. No benefit with Gabapentin. Denies falls. She is only taking Baclofen at night. Sleep has improved.  Denies falls. She is following Psychology.  She has concerns about bladder urgency.  She completed therapies last week.  Pain Inventory Average Pain 2 Pain Right Now 2 My pain is intermittent and dull  In the last 24 hours, has pain interfered with the following? General activity 0 Relation with others 0 Enjoyment of life 0 What TIME of day is your pain at its worst? night Sleep (in general) Fair  Pain is worse with: unsure and some activites Pain improves with: rest, heat/ice and therapy/exercise Relief from Meds: n/a  Mobility walk with assistance use a cane how many minutes can you walk? 5-10 ability to climb steps?  yes do you drive?  no  Function retired I need assistance with the following:  meal prep, household duties and shopping  Neuro/Psych bladder control problems weakness trouble walking spasms dizziness confusion depression anxiety  Prior Studies Any changes since last visit?  no  Physicians involved in your care Any changes since last visit?  no Primary care Candace Smith   Family History  Problem Relation Age of Onset  . Stroke Maternal Grandmother   . Stroke Paternal Grandfather    Social History   Socioeconomic History  . Marital status: Divorced    Spouse name: Not on file  . Number of children: Not on file  . Years of education: Not on file  . Highest  education level: Not on file  Occupational History  . Not on file  Tobacco Use  . Smoking status: Former Smoker    Packs/day: 0.50    Years: 25.00    Pack years: 12.50    Types: Cigarettes    Quit date: 05/02/2017    Years since quitting: 2.6  . Smokeless tobacco: Never Used  Substance and Sexual Activity  . Alcohol use: No    Alcohol/week: 1.0 standard drinks    Types: 1 Glasses of wine per week    Comment: social  . Drug use: No  . Sexual activity: Never  Other Topics Concern  . Not on file  Social History Narrative  . Not on file   Social Determinants of Health   Financial Resource Strain:   . Difficulty of Paying Living Expenses:   Food Insecurity:   . Worried About Charity fundraiser in the Last Year:   . Arboriculturist in the Last Year:   Transportation Needs:   . Film/video editor (Medical):   Marland Kitchen Lack of Transportation (Non-Medical):   Physical Activity:   . Days of Exercise per Week:   . Minutes of Exercise per Session:   Stress:   . Feeling of Stress :   Social Connections:   . Frequency of Communication with Friends and Family:   . Frequency of Social Gatherings with Friends and Family:   . Attends Religious Services:   . Active Member of Clubs or Organizations:   .  Attends Archivist Meetings:   Marland Kitchen Marital Status:    Past Surgical History:  Procedure Laterality Date  . FOOT SURGERY    . LOOP RECORDER INSERTION N/A 10/08/2017   Procedure: LOOP RECORDER INSERTION;  Surgeon: Deboraha Sprang, MD;  Location: [place] [facility];  Service: Cardiovascular;  Laterality: N/A;  . TEE WITHOUT CARDIOVERSION N/A 05/08/2017   Procedure: TRANSESOPHAGEAL ECHOCARDIOGRAM (TEE);  Surgeon: Lelon Perla, MD;  Location: [facility] ENDOSCOPY;  Service: Cardiovascular;  Laterality: N/A;   Past Medical History:  Diagnosis Date  . Hyperlipidemia   . Hypertension   . Stroke ([place])    BP (!) 145/91   Pulse 85   Temp (!) 97.2 F (36.2 C)   Ht 5\' 7"  (1.702 m)   Wt  135 lb 3.2 oz (61.3 kg)   SpO2 95%   BMI 21.18 kg/m   Opioid Risk Score:   Fall Risk Score:  `1  Depression screen PHQ 2/9  Depression screen [facility] 2/9 01/04/2020 12/30/2018 04/09/2018 01/09/2018 11/28/2017 11/13/2017 06/19/2017  Decreased Interest 3 0 0 0 1 0 0  Down, Depressed, Hopeless 3 0 0 0 1 2 0  PHQ - 2 Score 6 0 0 0 2 2 0  Altered sleeping - - - - 1 1 3   Tired, decreased energy - - - - 1 0 2  Change in appetite - - - - 1 0 1  Feeling bad or failure about yourself  - - - - 1 1 1   Trouble concentrating - - - - 1 0 1  Moving slowly or fidgety/restless - - - - 1 1 1   Suicidal thoughts - - - - 0 0 0  PHQ-9 Score - - - - 8 5 9   Difficult doing work/chores - - - - Somewhat difficult Somewhat difficult Not difficult at all    Review of Systems  HENT: Negative.   Eyes: Negative.   Respiratory: Negative.   Cardiovascular: Negative.   Gastrointestinal: Positive for constipation.       Poor appetite  Endocrine: Negative.   Genitourinary: Positive for urgency.  Musculoskeletal: Positive for arthralgias.       Spasms  Skin: Positive for rash.  Allergic/Immunologic: Negative.   Neurological: Positive for weakness and numbness.       Tingling   Hematological: Negative.   Psychiatric/Behavioral: Positive for confusion and dysphoric mood. The patient is nervous/anxious.   All other systems reviewed and are negative.     Objective:   Physical Exam  Constitutional: NAD. HENT: Normocephalic and atraumatic.  Musculoskeletal: Left shoulder subluxation  Gait: Hemiparetic Psych: Tearful Neurological: Alert Motor: LUE: 2+/5 shoulder abduction, elbow flex 3+-4+-/5, elbow ext 2+/5, hand grip 4-/5 with apraxia LLE: 4-/5 HF, 4/5 KE, ADF/PF 2/5  mAS left elbow flexors: 1+/4, wrist flexors 1+/4, finger flexors 1+/4 Left ADF/PF 1+/4 Follows commands.  Insight and awareness into deficits.     Assessment & Plan:  Femalewith history of HTN, right foot fractures presents for follow for right  basal ganglia infarct.   1. Left spastic hemiparesis, fatigue, and functional deficitssecondary to right basal ganglia infarct  Continue HEP, completed therapies No benefit with amantadine ContWHO/PRAFO, encouraged compliance  Continue Dysport injections: educated again on purpose of injections.             Dysport on next visit:              Left biceps300U  Left FCR 100U              Left FCU 100U             Left FDS 200U             Left FDP 200U                          Left Med Gastroc 150                         Left Lat Gastroc 150 Will increaseGabapentin to 600qhs  Hold Baclofen to 10 qhs  2. Gait abnormality Continue cane for safety  3. Sleep disturbance  Improving  4. Mood lability: Patient, now benefiting with Psychology  Continue Cymbalta 60mg   Nudexta ordered by Neurology - but cannot afford  Continue following up with Psychiatry  Major limiting factor  Wound evaluate for cognitive deficits after treatment of depression +/- Meds  5. Left shoulder subluxation with pain Kinesio taping on hold due to reaction to tape SPRINT placed on 9/5, removed on 11/6 Tramadol per PCP (educated on signs/symptoms serotonin syndrome) Ortho consult ordered by Neurology NP - recommended therapies, which she completed  6. Bony prominence right wrist Xray with bony prominence Voltaren gel in effective Good benefits with injection on 9/26  7. Right hand numbness Managed at present Positional             Improved  8. Urinary urgency  Unlikely stroke related  Recommend follow up with PCP

## 2020-01-09 ENCOUNTER — Other Ambulatory Visit: Payer: Self-pay

## 2020-01-09 ENCOUNTER — Encounter (HOSPITAL_COMMUNITY): Payer: Self-pay | Admitting: Emergency Medicine

## 2020-01-09 ENCOUNTER — Inpatient Hospital Stay (HOSPITAL_COMMUNITY)
Admission: EM | Admit: 2020-01-09 | Discharge: 2020-01-13 | DRG: 918 | Disposition: A | Discharge status: Other Acute Inpatient Hospital | Payer: Medicare Other | Attending: Internal Medicine | Admitting: Internal Medicine

## 2020-01-09 DIAGNOSIS — T391X2A Poisoning by 4-Aminophenol derivatives, intentional self-harm, initial encounter: Secondary | ICD-10-CM | POA: Diagnosis not present

## 2020-01-09 DIAGNOSIS — D72829 Elevated white blood cell count, unspecified: Secondary | ICD-10-CM | POA: Diagnosis not present

## 2020-01-09 DIAGNOSIS — E876 Hypokalemia: Secondary | ICD-10-CM | POA: Diagnosis not present

## 2020-01-09 DIAGNOSIS — T50902A Poisoning by unspecified drugs, medicaments and biological substances, intentional self-harm, initial encounter: Secondary | ICD-10-CM | POA: Diagnosis not present

## 2020-01-09 DIAGNOSIS — Z8673 Personal history of transient ischemic attack (TIA), and cerebral infarction without residual deficits: Secondary | ICD-10-CM | POA: Diagnosis not present

## 2020-01-09 DIAGNOSIS — E785 Hyperlipidemia, unspecified: Secondary | ICD-10-CM | POA: Diagnosis present

## 2020-01-09 DIAGNOSIS — I10 Essential (primary) hypertension: Secondary | ICD-10-CM | POA: Diagnosis not present

## 2020-01-09 DIAGNOSIS — Z20822 Contact with and (suspected) exposure to covid-19: Secondary | ICD-10-CM | POA: Diagnosis present

## 2020-01-09 DIAGNOSIS — Z7982 Long term (current) use of aspirin: Secondary | ICD-10-CM

## 2020-01-09 DIAGNOSIS — T50902D Poisoning by unspecified drugs, medicaments and biological substances, intentional self-harm, subsequent encounter: Secondary | ICD-10-CM | POA: Diagnosis not present

## 2020-01-09 DIAGNOSIS — F419 Anxiety disorder, unspecified: Secondary | ICD-10-CM | POA: Diagnosis present

## 2020-01-09 DIAGNOSIS — I69354 Hemiplegia and hemiparesis following cerebral infarction affecting left non-dominant side: Secondary | ICD-10-CM

## 2020-01-09 DIAGNOSIS — D696 Thrombocytopenia, unspecified: Secondary | ICD-10-CM | POA: Diagnosis present

## 2020-01-09 DIAGNOSIS — T39012A Poisoning by aspirin, intentional self-harm, initial encounter: Secondary | ICD-10-CM | POA: Diagnosis not present

## 2020-01-09 DIAGNOSIS — F332 Major depressive disorder, recurrent severe without psychotic features: Secondary | ICD-10-CM | POA: Diagnosis not present

## 2020-01-09 DIAGNOSIS — Z79899 Other long term (current) drug therapy: Secondary | ICD-10-CM | POA: Diagnosis not present

## 2020-01-09 DIAGNOSIS — Z87891 Personal history of nicotine dependence: Secondary | ICD-10-CM

## 2020-01-09 DIAGNOSIS — Z823 Family history of stroke: Secondary | ICD-10-CM | POA: Diagnosis not present

## 2020-01-09 DIAGNOSIS — R739 Hyperglycemia, unspecified: Secondary | ICD-10-CM

## 2020-01-09 DIAGNOSIS — T50901A Poisoning by unspecified drugs, medicaments and biological substances, accidental (unintentional), initial encounter: Secondary | ICD-10-CM | POA: Diagnosis present

## 2020-01-09 LAB — HEPATIC FUNCTION PANEL
ALT: 31 U/L (ref 0–44)
AST: 35 U/L (ref 15–41)
Albumin: 2.8 g/dL — ABNORMAL LOW (ref 3.5–5.0)
Alkaline Phosphatase: 45 U/L (ref 38–126)
Bilirubin, Direct: <0.1 mg/dL (ref 0.0–0.2)
Total Bilirubin: 0.5 mg/dL (ref 0.3–1.2)
Total Protein: 6.8 g/dL (ref 6.5–8.1)

## 2020-01-09 LAB — COMPREHENSIVE METABOLIC PANEL
ALT: 36 U/L (ref 0–44)
AST: 46 U/L — ABNORMAL HIGH (ref 15–41)
Albumin: 3.6 g/dL (ref 3.5–5.0)
Alkaline Phosphatase: 57 U/L (ref 38–126)
Anion gap: 16 — ABNORMAL HIGH (ref 5–15)
BUN: 12 mg/dL (ref 8–23)
CO2: 20 mmol/L — ABNORMAL LOW (ref 22–32)
Calcium: 9.1 mg/dL (ref 8.9–10.3)
Chloride: 103 mmol/L (ref 98–111)
Creatinine, Ser: 0.82 mg/dL (ref 0.44–1.00)
GFR calc Af Amer: >60 mL/min (ref >60)
GFR calc non Af Amer: >60 mL/min (ref >60)
Glucose, Bld: 179 mg/dL — ABNORMAL HIGH (ref 70–99)
Potassium: 3.7 mmol/L (ref 3.5–5.1)
Sodium: 139 mmol/L (ref 135–145)
Total Bilirubin: 0.4 mg/dL (ref 0.3–1.2)
Total Protein: 8.2 g/dL — ABNORMAL HIGH (ref 6.5–8.1)

## 2020-01-09 LAB — CBC
HCT: 39.4 % (ref 36.0–46.0)
Hemoglobin: 12.8 g/dL (ref 12.0–15.0)
MCH: 33.1 pg (ref 26.0–34.0)
MCHC: 32.5 g/dL (ref 30.0–36.0)
MCV: 101.8 fL — ABNORMAL HIGH (ref 80.0–100.0)
Platelets: 167 10*3/uL (ref 150–400)
RBC: 3.87 MIL/uL (ref 3.87–5.11)
RDW: 11.4 % — ABNORMAL LOW (ref 11.5–15.5)
WBC: 4 10*3/uL (ref 4.0–10.5)
nRBC: 0 % (ref 0.0–0.2)

## 2020-01-09 LAB — SARS CORONAVIRUS 2 BY RT PCR (HOSPITAL ORDER, PERFORMED IN ~~LOC~~ HOSPITAL LAB): SARS Coronavirus 2: NEGATIVE

## 2020-01-09 LAB — I-STAT VENOUS BLOOD GAS, ED
Acid-Base Excess: 0 mmol/L (ref 0.0–2.0)
Bicarbonate: 23.8 mmol/L (ref 20.0–28.0)
Calcium, Ion: 0.98 mmol/L — ABNORMAL LOW (ref 1.15–1.40)
HCT: 36 % (ref 36.0–46.0)
Hemoglobin: 12.2 g/dL (ref 12.0–15.0)
O2 Saturation: 73 %
Potassium: 3.9 mmol/L (ref 3.5–5.1)
Sodium: 141 mmol/L (ref 135–145)
TCO2: 25 mmol/L (ref 22–32)
pCO2, Ven: 36.3 mmHg — ABNORMAL LOW (ref 44.0–60.0)
pH, Ven: 7.424 (ref 7.250–7.430)
pO2, Ven: 37 mmHg (ref 32.0–45.0)

## 2020-01-09 LAB — PROTIME-INR
INR: 1.1 (ref 0.8–1.2)
INR: 1.4 — ABNORMAL HIGH (ref 0.8–1.2)
Prothrombin Time: 13.3 seconds (ref 11.4–15.2)
Prothrombin Time: 16.5 seconds — ABNORMAL HIGH (ref 11.4–15.2)

## 2020-01-09 LAB — ACETAMINOPHEN LEVEL
Acetaminophen (Tylenol), Serum: 88 ug/mL — ABNORMAL HIGH (ref 10–30)
Acetaminophen (Tylenol), Serum: 64 ug/mL — ABNORMAL HIGH (ref 10–30)
Acetaminophen (Tylenol), Serum: 24 ug/mL (ref 10–30)

## 2020-01-09 LAB — ETHANOL: Alcohol, Ethyl (B): <10 mg/dL (ref <10)

## 2020-01-09 LAB — SALICYLATE LEVEL: Salicylate Lvl: 44.7 mg/dL (ref 7.0–30.0)

## 2020-01-09 LAB — HIV ANTIBODY (ROUTINE TESTING W REFLEX): HIV Screen 4th Generation wRfx: NONREACTIVE

## 2020-01-09 LAB — TSH: TSH: 0.559 u[IU]/mL (ref 0.350–4.500)

## 2020-01-09 LAB — MAGNESIUM: Magnesium: 2 mg/dL (ref 1.7–2.4)

## 2020-01-09 MED ORDER — ONDANSETRON HCL 4 MG/2ML IJ SOLN
4.0000 mg | Freq: Once | INTRAMUSCULAR | Status: AC
  Administered 2020-01-09: 4 mg via INTRAVENOUS
  Filled 2020-01-09: qty 2

## 2020-01-09 MED ORDER — ACETYLCYSTEINE LOAD VIA INFUSION
150.0000 mg/kg | Freq: Once | INTRAVENOUS | Status: AC
  Administered 2020-01-09: 9195 mg via INTRAVENOUS
  Filled 2020-01-09: qty 230

## 2020-01-09 MED ORDER — ONDANSETRON HCL 4 MG/2ML IJ SOLN: 4.0000 mg | Freq: Once | INTRAMUSCULAR | Status: DC

## 2020-01-09 MED ORDER — ALPRAZOLAM 0.25 MG PO TABS: 0.2500 mg | ORAL_TABLET | Freq: Three times a day (TID) | ORAL | Status: DC | PRN

## 2020-01-09 MED ORDER — DICLOFENAC SODIUM 1 % TD GEL
2.0000 g | Freq: Four times a day (QID) | TRANSDERMAL | Status: DC
  Administered 2020-01-10 – 2020-01-12 (×5): 2 g via TOPICAL
  Filled 2020-01-09 (×2): qty 100

## 2020-01-09 MED ORDER — SODIUM BICARBONATE 8.4 % IV SOLN
INTRAVENOUS | Status: DC
  Filled 2020-01-09 (×4): qty 1000

## 2020-01-09 MED ORDER — GABAPENTIN 600 MG PO TABS
600.0000 mg | ORAL_TABLET | Freq: Every day | ORAL | Status: DC
  Administered 2020-01-09 – 2020-01-12 (×4): 600 mg via ORAL
  Filled 2020-01-09 (×4): qty 1

## 2020-01-09 MED ORDER — DULOXETINE HCL 60 MG PO CPEP
60.0000 mg | ORAL_CAPSULE | Freq: Every day | ORAL | Status: DC
  Administered 2020-01-09 – 2020-01-13 (×5): 60 mg via ORAL
  Filled 2020-01-09 (×5): qty 1

## 2020-01-09 MED ORDER — SODIUM BICARBONATE 8.4 % IV SOLN
50.0000 meq | Freq: Once | INTRAVENOUS | Status: AC
  Administered 2020-01-09: 50 meq via INTRAVENOUS
  Filled 2020-01-09: qty 50

## 2020-01-09 MED ORDER — DEXTROSE 5 % IV SOLN
15.0000 mg/kg/h | INTRAVENOUS | Status: DC
  Administered 2020-01-09: 15 mg/kg/h via INTRAVENOUS
  Filled 2020-01-09 (×2): qty 120

## 2020-01-09 MED ORDER — MELATONIN 3 MG PO TABS
3.0000 mg | ORAL_TABLET | Freq: Every day | ORAL | Status: DC
  Administered 2020-01-09 – 2020-01-12 (×4): 3 mg via ORAL
  Filled 2020-01-09 (×4): qty 1

## 2020-01-09 MED ORDER — VITAMIN B-12 1000 MCG PO TABS
1000.0000 ug | ORAL_TABLET | Freq: Every day | ORAL | Status: DC
  Administered 2020-01-10 – 2020-01-13 (×4): 1000 ug via ORAL
  Filled 2020-01-09 (×4): qty 1

## 2020-01-09 MED ORDER — POLYETHYLENE GLYCOL 3350 17 G PO PACK
17.0000 g | PACK | Freq: Every day | ORAL | Status: DC | PRN
  Administered 2020-01-12: 17 g via ORAL
  Filled 2020-01-09: qty 1

## 2020-01-09 MED ORDER — SODIUM CHLORIDE 0.9 % IV BOLUS
1000.0000 mL | Freq: Once | INTRAVENOUS | Status: AC
  Administered 2020-01-09: 1000 mL via INTRAVENOUS

## 2020-01-09 MED ORDER — BIOTIN 1000 MCG PO TABS: 1000.0000 ug | ORAL_TABLET | Freq: Every day | ORAL | Status: DC

## 2020-01-09 NOTE — ED Notes (Signed)
Labs draw by Yvone Neu, NT.

## 2020-01-09 NOTE — ED Notes (Signed)
PC called and will fax orders to ED

## 2020-01-09 NOTE — ED Notes (Signed)
No labs or medications given by prior shift. Pt transferred to yellow zone room 41 at 2100, per charge nurse report was given to the admitting unit and pt unable to go to the unit until she has a sitter do to suicidal though. Daughter is at the bedside, night time medication requested to pharmacy.

## 2020-01-09 NOTE — ED Notes (Signed)
Sitter arrived to ED.

## 2020-01-09 NOTE — H&P (Signed)
History and Physical    Kelly Lowe BPZ:[id number] DOB: [birth year] DOA: 01/09/2020  PCP: Carol Ada, MD (Confirm with patient/family/NH records and if not entered, this has to be entered at [facility] point of entry) Patient coming from: Home  I have personally briefly reviewed patient's old medical records in [place]  Chief Complaint: I feel fine  HPI: Kelly Lowe is a 68 y.o. female with medical history significant of with CVAor left-sided deficits, hypertension, hyperlipidemia, depression who presents for evaluation of suicidal attempt with Tylenol and aspirin overdose. Patient has been seen by psychiatry as outpatient and was started on Depakote about 2 weeks ago for worsening of depression symptoms especially frequent tearing.  Patient told her daughter that she did not like Depakote level.  Last night, patient ingested unknown amount of Tylenol and aspirin yesterday at approximately 8 PM with intentions to end her life. She felt nauseous overnight, with 2-3 times vomiting of stomach content but denies any abdominal pain or bleeding.  This morning she told her daughter about what happened last night.  And daughter called 911.. She denies any high headache, lightheadedness, chest pain, shortness of breath, abdominal pain. ED Course: Acetaminophen level 88, anion gap 16, bicarb 20.  Concern control contacted and acetylcysteine drip started and bicarb drip started  Review of Systems: As per HPI otherwise 10 point review of systems negative.    Past Medical History:  Diagnosis Date  . Hyperlipidemia   . Hypertension   . Stroke [facility])     Past Surgical History:  Procedure Laterality Date  . FOOT SURGERY    . LOOP RECORDER INSERTION N/A 10/08/2017   Procedure: LOOP RECORDER INSERTION;  Surgeon: Deboraha Sprang, MD;  Location: [place] CV LAB;  Service: Cardiovascular;  Laterality: N/A;  . TEE WITHOUT CARDIOVERSION N/A 05/08/2017   Procedure: TRANSESOPHAGEAL  ECHOCARDIOGRAM (TEE);  Surgeon: Lelon Perla, MD;  Location: [facility] ENDOSCOPY;  Service: Cardiovascular;  Laterality: N/A;     reports that she quit smoking about 2 years ago. Her smoking use included cigarettes. She has a 12.50 pack-year smoking history. She has never used smokeless tobacco. She reports that she does not drink alcohol or use drugs.  No Known Allergies  Family History  Problem Relation Age of Onset  . Stroke Maternal Grandmother   . Stroke Paternal Grandfather      Prior to Admission medications   Medication Sig Start Date End Date Taking? Authorizing Provider  acetaminophen (TYLENOL) 325 MG tablet Take 2 tablets (650 mg total) by mouth every 6 (six) hours as needed for mild pain (or Fever >/= 101). 05/06/17  Yes Alphonzo Grieve, MD  amLODipine (NORVASC) 5 MG tablet Take 5 mg by mouth daily. 04/18/17  Yes [provider]  aspirin 325 MG tablet Take 1 tablet (325 mg total) by mouth daily. 05/07/17  Yes Alphonzo Grieve, MD  atorvastatin (LIPITOR) 40 MG tablet Take 1 tablet (40 mg total) by mouth daily at 6 PM. 05/28/17  Yes Angiulli, Lavon Paganini, PA-C  Biotin 1000 MCG tablet Take 1,000 mcg by mouth daily.   Yes [provider]  diclofenac sodium (VOLTAREN) 1 % GEL Apply 2 g topically 4 (four) times daily. 05/28/17  Yes Angiulli, Lavon Paganini, PA-C  divalproex (DEPAKOTE ER) 250 MG 24 hr tablet Take 250 mg by mouth at bedtime. 12/14/19  Yes [provider]  DULoxetine (CYMBALTA) 60 MG capsule TAKE 1 CAPSULE(60 MG) BY MOUTH DAILY Patient taking differently: Take 60 mg by  mouth daily.  12/20/19  Yes Jamse Arn, MD  gabapentin (NEURONTIN) 600 MG tablet Take 1 tablet (600 mg total) by mouth at bedtime. 01/04/20  Yes Jamse Arn, MD  Melatonin 3 MG TABS Take 3 mg by mouth at bedtime.    Yes [provider]  polyethylene glycol (MIRALAX / GLYCOLAX) packet Take 17 g by mouth daily as needed (for constipation.).    Yes [provider]    vitamin B-12 (CYANOCOBALAMIN) 1000 MCG tablet Take 1,000 mcg by mouth daily.   Yes [provider]    Physical Exam: Vitals:   01/09/20 1232 01/09/20 1325 01/09/20 1603  BP: 113/84 117/71   Pulse: 98 87   Resp: 18    SpO2: 97%    Weight:   62.1 kg    Constitutional: NAD, calm, comfortable Vitals:   01/09/20 1232 01/09/20 1325 01/09/20 1603  BP: 113/84 117/71   Pulse: 98 87   Resp: 18    SpO2: 97%    Weight:   62.1 kg   Eyes: PERRL, lids and conjunctivae normal ENMT: Mucous membranes are moist. Posterior pharynx clear of any exudate or lesions.Normal dentition.  Neck: normal, supple, no masses, no thyromegaly Respiratory: clear to auscultation bilaterally, no wheezing, no crackles. Normal respiratory effort. No accessory muscle use.  Cardiovascular: Regular rate and rhythm, no murmurs / rubs / gallops. No extremity edema. 2+ pedal pulses. No carotid bruits.  Abdomen: no tenderness, no masses palpated. No hepatosplenomegaly. Bowel sounds positive.  Musculoskeletal: no clubbing / cyanosis. No joint deformity upper and lower extremities. Good ROM, no contractures. Normal muscle tone.  Skin: no rashes, lesions, ulcers. No induration Neurologic: CN 2-12 grossly intact. Sensation intact, DTR normal. Strength slightly weaker on the left arm compared to the right.  Psychiatric: Depressed. Alert and oriented x 3.   Labs on Admission: I have personally reviewed following labs and imaging studies  CBC: Recent Labs  Lab 01/09/20 1242 01/09/20 1531  WBC 4.0  --   HGB 12.8 12.2  HCT 39.4 36.0  MCV 101.8*  --   PLT 167  --    Basic Metabolic Panel: Recent Labs  Lab 01/09/20 1242 01/09/20 1524 01/09/20 1531  NA 139  --  141  K 3.7  --  3.9  CL 103  --   --   CO2 20*  --   --   GLUCOSE 179*  --   --   BUN 12  --   --   CREATININE 0.82  --   --   CALCIUM 9.1  --   --   MG  --  2.0  --    GFR: Estimated Creatinine Clearance: 63.9 mL/min (by C-G formula based on  SCr of 0.82 mg/dL). Liver Function Tests: Recent Labs  Lab 01/09/20 1242  AST 46*  ALT 36  ALKPHOS 57  BILITOT 0.4  PROT 8.2*  ALBUMIN 3.6   No results for input(s): LIPASE, AMYLASE in the last 168 hours. No results for input(s): AMMONIA in the last 168 hours. Coagulation Profile: Recent Labs  Lab 01/09/20 1524  INR 1.1   Cardiac Enzymes: No results for input(s): CKTOTAL, CKMB, CKMBINDEX, TROPONINI in the last 168 hours. BNP (last 3 results) No results for input(s): PROBNP in the last 8760 hours. HbA1C: No results for input(s): HGBA1C in the last 72 hours. CBG: No results for input(s): GLUCAP in the last 168 hours. Lipid Profile: No results for input(s): CHOL, HDL, LDLCALC, TRIG, CHOLHDL, LDLDIRECT  in the last 72 hours. Thyroid Function Tests: No results for input(s): TSH, T4TOTAL, FREET4, T3FREE, THYROIDAB in the last 72 hours. Anemia Panel: No results for input(s): VITAMINB12, FOLATE, FERRITIN, TIBC, IRON, RETICCTPCT in the last 72 hours. Urine analysis:    Component Value Date/Time   COLORURINE YELLOW 05/19/2017 1759   APPEARANCEUR CLEAR 05/19/2017 1759   LABSPEC 1.010 05/19/2017 1759   PHURINE 5.0 05/19/2017 1759   GLUCOSEU NEGATIVE 05/19/2017 1759   HGBUR NEGATIVE 05/19/2017 1759   BILIRUBINUR NEGATIVE 05/19/2017 1759   KETONESUR NEGATIVE 05/19/2017 1759   PROTEINUR NEGATIVE 05/19/2017 1759   NITRITE NEGATIVE 05/19/2017 1759   LEUKOCYTESUR SMALL (A) 05/19/2017 1759    Radiological Exams on Admission: No results found.  EKG: Independently reviewed.  No acute ST-T changes  Assessment/Plan Active Problems:   Overdose by acetaminophen   Overdose   Acetaminophen overdose -Poison control notified and recommend acetylcysteine drip, which started in the ED -Recheck Tylenol level tonight and tomorrow, pharmacy to adjust acetylcysteine rate -No segment elevation of LFT or INR, but will recheck tomorrow -Hold chemical DVT prophylaxis until INR stable for >24  hours. -If her LFT abruptly deteriorated, consider transfer to liver transplant center.  Aspirin overdose -Bicarb drip, Urine PH -No significant metabolic acidosis or respiratory alkalosis -Hold chemical DVT prophylaxis for today  Suicidal attempt with Tylenol and aspirin overdose -Discontinue Depakote as per patient and family request -Xanax as needed -Start psychiatrist for inpatient psych admission after medical treatment  HLD -Hold Statin until her LFT remains stable >24 hours  History of CVA with left-sided residual weakness -Hold ASA and statin for today    DVT prophylaxis: SCD Code Status: Full Family Communication: Daughter at bedside Disposition Plan: Probably will need inpatient psy after medically stabilized in 1-2 days Consults called: Poison control Admission status: PCU   Lequita Halt MD [facility] Pager [phone]   01/09/2020, 4:59 PM

## 2020-01-09 NOTE — ED Provider Notes (Signed)
[place] EMERGENCY DEPARTMENT Provider Note   CSN: [id number] Arrival date & time: 01/09/20  1227    History Chief Complaint  Patient presents with  . Suicidal  . Drug Overdose    Kelly Lowe is a 69 y.o. female with CVA or left-sided deficits, hypertension, hyperlipidemia, depression who presents for evaluation of intentional overdose.  Patient states she ingested unknown amount of Tylenol and aspirin yesterday at approximately 8 PM with intentions to end her life.  Patient states she has been struggling with depression.  She is very tearful.  Denies any AVH or HI.  Has never been inpatient for psychiatric purposes.  She is followed by psychiatry and a therapist.  She admits to persistent emesis and nausea since taking these medications.  She denies any high headache, lightheadedness, dizziness, chest pain, shortness of breath, abdominal pain, diarrhea, dysuria.  She is chronic left-sided deficits due to prior CVA which she states is at her baseline.  Denies additional aggravating or alleviating factors.  History obtained from patient, daughter in room and past medical records.  No interpreter is used.  HPI     Past Medical History:  Diagnosis Date  . Hyperlipidemia   . Hypertension   . Stroke [facility])     Patient Active Problem List   Diagnosis Date Noted  . Overdose by acetaminophen 01/09/2020  . Overdose 01/09/2020  . Subluxation of left shoulder joint 01/04/2020  . Hemiparesis as late effect of cerebrovascular accident (CVA) ([place]) 10/14/2019  . 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 Spastic hemiplegia affecting nondominant side ([place]) 12/30/2018  . Adjustment disorder with depressed mood   . Spastic hemiparesis ([place])   . Constipation   . Chronic bilateral low back pain without sciatica   . Neurogenic bladder   . Fracture of metatarsal bone of left foot   . Hemiparesis of left nondominant side ([place]) 05/13/2017  . Cognitive deficits following  cerebral infarction 05/13/2017  . Sleep disturbance   . Closed fracture of bone of right foot   . Benign essential HTN   . Slow transit constipation   . Embolic stroke of right basal ganglia ([place]) 05/06/2017  . Tobacco use 05/05/2017  . Cerebral infarction ([place])   . Pain   . Hypokalemia   . Acute blood loss anemia   . CVA (cerebral vascular accident) ([place]) 05/03/2017    Past Surgical History:  Procedure Laterality Date  . FOOT SURGERY    . LOOP RECORDER INSERTION N/A 10/08/2017   Procedure: LOOP RECORDER INSERTION;  Surgeon: Deboraha Sprang, MD;  Location: [place] CV LAB;  Service: Cardiovascular;  Laterality: N/A;  . TEE WITHOUT CARDIOVERSION N/A 05/08/2017   Procedure: TRANSESOPHAGEAL ECHOCARDIOGRAM (TEE);  Surgeon: Lelon Perla, MD;  Location: [facility] ENDOSCOPY;  Service: Cardiovascular;  Laterality: N/A;     OB History   No obstetric history on file.     Family History  Problem Relation Age of Onset  . Stroke Maternal Grandmother   . Stroke Paternal Grandfather     Social History   Tobacco Use  . Smoking status: Former Smoker    Packs/day: 0.50    Years: 25.00    Pack years: 12.50    Types: Cigarettes    Quit date: 05/02/2017    Years since quitting: 2.6  . Smokeless tobacco: Never Used  Substance Use Topics  . Alcohol use: No    Alcohol/week: 1.0 standard drinks    Types: 1 Glasses of wine per week  Comment: social  . Drug use: No    Home Medications Prior to Admission medications   Medication Sig Start Date End Date Taking? Authorizing Provider  acetaminophen (TYLENOL) 325 MG tablet Take 2 tablets (650 mg total) by mouth every 6 (six) hours as needed for mild pain (or Fever >/= 101). 05/06/17  Yes Alphonzo Grieve, MD  amLODipine (NORVASC) 5 MG tablet Take 5 mg by mouth daily. 04/18/17  Yes [provider]  aspirin 325 MG tablet Take 1 tablet (325 mg total) by mouth daily. 05/07/17  Yes Alphonzo Grieve, MD  atorvastatin (LIPITOR) 40 MG tablet  Take 1 tablet (40 mg total) by mouth daily at 6 PM. 05/28/17  Yes Angiulli, Lavon Paganini, PA-C  Biotin 1000 MCG tablet Take 1,000 mcg by mouth daily.   Yes [provider]  diclofenac sodium (VOLTAREN) 1 % GEL Apply 2 g topically 4 (four) times daily. 05/28/17  Yes Angiulli, Lavon Paganini, PA-C  divalproex (DEPAKOTE ER) 250 MG 24 hr tablet Take 250 mg by mouth at bedtime. 12/14/19  Yes [provider]  DULoxetine (CYMBALTA) 60 MG capsule TAKE 1 CAPSULE(60 MG) BY MOUTH DAILY Patient taking differently: Take 60 mg by mouth daily.  12/20/19  Yes Jamse Arn, MD  gabapentin (NEURONTIN) 600 MG tablet Take 1 tablet (600 mg total) by mouth at bedtime. 01/04/20  Yes Jamse Arn, MD  Melatonin 3 MG TABS Take 3 mg by mouth at bedtime.    Yes [provider]  polyethylene glycol (MIRALAX / GLYCOLAX) packet Take 17 g by mouth daily as needed (for constipation.).    Yes [provider]  vitamin B-12 (CYANOCOBALAMIN) 1000 MCG tablet Take 1,000 mcg by mouth daily.   Yes [provider]    Allergies    Patient has no known allergies.  Review of Systems   Review of Systems  Constitutional: Negative.   HENT: Negative.   Respiratory: Negative.   Cardiovascular: Negative.   Gastrointestinal: Positive for nausea and vomiting. Negative for abdominal distention, abdominal pain, anal bleeding, blood in stool, constipation, diarrhea and rectal pain.  Genitourinary: Negative.   Musculoskeletal: Negative.   Skin: Negative.   Neurological: Positive for weakness (LE chronic at baseline).  All other systems reviewed and are negative.  Physical Exam Updated Vital Signs BP 117/71   Pulse 87   Resp 18   Wt 62.1 kg   SpO2 97%   BMI 21.46 kg/m   Physical Exam Vitals and nursing note reviewed.  Constitutional:      General: She is not in acute distress.    Appearance: Normal appearance. She is well-developed. She is not ill-appearing, toxic-appearing or diaphoretic.       Comments: Tearful  HENT:     Head: Normocephalic and atraumatic.     Nose: Nose normal.     Mouth/Throat:     Mouth: Mucous membranes are moist.  Eyes:     Pupils: Pupils are equal, round, and reactive to light.  Cardiovascular:     Rate and Rhythm: Normal rate.     Pulses: Normal pulses.     Heart sounds: Normal heart sounds.  Pulmonary:     Effort: Pulmonary effort is normal. No respiratory distress.     Breath sounds: Normal breath sounds. No stridor. No wheezing, rhonchi or rales.     Comments: Speaks in full sentences without difficulty.  Lungs clear to auscultation bilaterally Chest:     Chest wall: No tenderness.  Abdominal:  General: Bowel sounds are normal. There is no distension.     Tenderness: There is no abdominal tenderness. There is no right CVA tenderness, left CVA tenderness, guarding or rebound.  Musculoskeletal:        General: No swelling, tenderness or signs of injury. Normal range of motion.     Cervical back: Normal range of motion.     Right lower leg: No edema.     Left lower leg: No edema.     Comments: Left lower extremity in brace, weakness to left extremities due to prior CVA.  At baseline.  Skin:    General: Skin is warm and dry.     Capillary Refill: Capillary refill takes less than 2 seconds.  Neurological:     General: No focal deficit present.     Mental Status: She is alert and oriented to person, place, and time.  Psychiatric:     Comments: Tearful, admits to depression.  Admits to intentional suicide attempt however denies HI, AVH.    ED Results / Procedures / Treatments   Labs (all labs ordered are listed, but only abnormal results are displayed) Labs Reviewed  COMPREHENSIVE METABOLIC PANEL - Abnormal; Notable for the following components:      Result Value   CO2 20 (*)    Glucose, Bld 179 (*)    Total Protein 8.2 (*)    AST 46 (*)    Anion gap 16 (*)    All other components within normal limits  SALICYLATE LEVEL -  Abnormal; Notable for the following components:   Salicylate Lvl 34.1 (*)    All other components within normal limits  ACETAMINOPHEN LEVEL - Abnormal; Notable for the following components:   Acetaminophen (Tylenol), Serum 88 (*)    All other components within normal limits  CBC - Abnormal; Notable for the following components:   MCV 101.8 (*)    RDW 11.4 (*)    All other components within normal limits  ACETAMINOPHEN LEVEL - Abnormal; Notable for the following components:   Acetaminophen (Tylenol), Serum 64 (*)    All other components within normal limits  I-STAT VENOUS BLOOD GAS, ED - Abnormal; Notable for the following components:   pCO2, Ven 36.3 (*)    Calcium, Ion 0.98 (*)    All other components within normal limits  SARS CORONAVIRUS 2 BY RT PCR (HOSPITAL ORDER, [place] LAB)  ETHANOL  MAGNESIUM  PROTIME-INR  RAPID URINE DRUG SCREEN, HOSP PERFORMED  HIV ANTIBODY (ROUTINE TESTING W REFLEX)  ACETAMINOPHEN LEVEL  PROTIME-INR  HEPATIC FUNCTION PANEL    EKG EKG Interpretation  Date/Time:  Sunday January 09 2020 13:06:13 EDT Ventricular Rate:  83 PR Interval:    QRS Duration: 85 QT Interval:  388 QTC Calculation: 456 R Axis:   -49 Text Interpretation: Sinus rhythm LAD, consider left anterior fascicular block Abnormal R-wave progression, early transition no significant change since 2018 Confirmed by Sherwood Gambler [phone]) on 01/09/2020 1:08:10 PM  Radiology No results found.  Procedures .Critical Care Performed by: Nettie Elm, PA-C Authorized by: Nettie Elm, PA-C   Critical care provider statement:    Critical care time (minutes):  45   Critical care was necessary to treat or prevent imminent or life-threatening deterioration of the following conditions: Tylenol and Aspirin overdose.   Critical care was time spent personally by me on the following activities:  Discussions with consultants, evaluation of patient's response to  treatment, examination of patient, ordering and performing  treatments and interventions, ordering and review of laboratory studies, ordering and review of radiographic studies, pulse oximetry, re-evaluation of patient's condition, obtaining history from patient or surrogate and review of old charts   (including critical care time)  Medications Ordered in ED Medications  acetylcysteine (ACETADOTE) 40 mg/mL load via infusion 9,195 mg (has no administration in time range)    Followed by  acetylcysteine (ACETADOTE) 24,000 mg in dextrose 5 % 600 mL (40 mg/mL) infusion (15 mg/kg/hr  61.3 kg Intravenous New Bag/Given 01/09/20 1559)  sodium bicarbonate injection 50 mEq (has no administration in time range)  DULoxetine (CYMBALTA) DR capsule 60 mg (has no administration in time range)  polyethylene glycol (MIRALAX / GLYCOLAX) packet 17 g (has no administration in time range)  vitamin B-12 (CYANOCOBALAMIN) tablet 1,000 mcg (has no administration in time range)  melatonin tablet 3 mg (has no administration in time range)  gabapentin (NEURONTIN) tablet 600 mg (has no administration in time range)  diclofenac sodium (VOLTAREN) 1 % transdermal gel 2 g (has no administration in time range)  ondansetron (ZOFRAN) injection 4 mg (has no administration in time range)  sodium chloride 0.9 % bolus 1,000 mL (1,000 mLs Intravenous New Bag/Given 01/09/20 1448)  ondansetron (ZOFRAN) injection 4 mg (4 mg Intravenous Given 01/09/20 1446)   ED Course  I have reviewed the triage vital signs and the nursing notes.  Pertinent labs & imaging results that were available during my care of the patient were reviewed by me and considered in my medical decision making (see chart for details).  69 year old female presents for evaluation of intentional overdose.  Admits to depression and states last night it "just got too much."  Denies any HI, AVH.  She is neurovascularly intact.  She has neuromusculoskeletal exam.  Her abdomen soft,  nontender.  Does have some baseline weakness to her left extremity due to prior CVA.  Denies prior hospitalizations however is currently being seen by therapist and psychiatry.  Plan on labs and reassess.  CBC without leukocytosis, hemoglobin 00.3 Metabolic panel with mild hyperglycemia to 179, mild elevation AST at 46, anion gap at 16 however I have low suspicion for DKA however yet we are awaiting urine Acetaminophen 88 A salicylate 70.4 Ethanol less than 10 EKG without STEMI  Consult poison control.  Due to elevated level 17 hours post ingestion patient qualifies for NAC as well as bicarb drip for ethanol and salicylate levels.  Patient will need to be admitted for continued treatment.  N-acetylcysteine and bicarb orders per pharmacy  Patient with normal mentation.    She is not under IVC and is here voluntarily.  She would likely need to talk with psychiatry prior to discharge when she is medically cleared due to intentional overdose  Discussed plan with patient and daughter in room.  They are agreeable to this.  Consult with Dr. Roosevelt Locks with [facility] who will evaluate patient for admission.  Discussed with attending, Dr. Colin Mulders who agrees with the treatment, plan and disposition.  The patient appears reasonably stabilized for admission considering the current resources, flow, and capabilities available in the ED at this time, and I doubt any other [facility] requiring further screening and/or treatment in the ED prior to admission.  MDM Rules/Calculators/A&P                       Final Clinical Impression(s) / ED Diagnoses Final diagnoses:  Overdose on Tylenol, intentional self-harm, initial encounter ([place])  Intentional aspirin overdose, initial encounter (  [place])  Hyperglycemia    Rx / DC Orders ED Discharge Orders    None       Zoha Spranger A, PA-C 01/09/20 1614    Sherwood Gambler, MD 01/12/20 [phone]

## 2020-01-09 NOTE — ED Triage Notes (Signed)
Pt states she took a "handfull" of Aspirin and at least 15-20 Tylenol 325mg  last night because she wanted to go to sleep and not wake up.  Sates she was suicidal last night but denies at present.  Family believes related to Divalproex that pt has been taking for 2 weeks.  Pt tearful.

## 2020-01-10 ENCOUNTER — Telehealth: Payer: Self-pay | Admitting: *Deleted

## 2020-01-10 DIAGNOSIS — F332 Major depressive disorder, recurrent severe without psychotic features: Secondary | ICD-10-CM | POA: Diagnosis present

## 2020-01-10 LAB — URINALYSIS, ROUTINE W REFLEX MICROSCOPIC
Bilirubin Urine: NEGATIVE
Glucose, UA: NEGATIVE mg/dL
Hgb urine dipstick: NEGATIVE
Ketones, ur: 20 mg/dL — AB
Leukocytes,Ua: NEGATIVE
Nitrite: NEGATIVE
Protein, ur: NEGATIVE mg/dL
Specific Gravity, Urine: 1.02 (ref 1.005–1.030)
pH: 5 (ref 5.0–8.0)

## 2020-01-10 LAB — COMPREHENSIVE METABOLIC PANEL
ALT: 27 U/L (ref 0–44)
AST: 31 U/L (ref 15–41)
Albumin: 2.6 g/dL — ABNORMAL LOW (ref 3.5–5.0)
Alkaline Phosphatase: 38 U/L (ref 38–126)
Anion gap: 11 (ref 5–15)
BUN: 9 mg/dL (ref 8–23)
CO2: 26 mmol/L (ref 22–32)
Calcium: 8.4 mg/dL — ABNORMAL LOW (ref 8.9–10.3)
Chloride: 103 mmol/L (ref 98–111)
Creatinine, Ser: 0.75 mg/dL (ref 0.44–1.00)
GFR calc Af Amer: >60 mL/min (ref >60)
GFR calc non Af Amer: >60 mL/min (ref >60)
Glucose, Bld: 122 mg/dL — ABNORMAL HIGH (ref 70–99)
Potassium: 2.6 mmol/L — CL (ref 3.5–5.1)
Sodium: 140 mmol/L (ref 135–145)
Total Bilirubin: 0.5 mg/dL (ref 0.3–1.2)
Total Protein: 6.1 g/dL — ABNORMAL LOW (ref 6.5–8.1)

## 2020-01-10 LAB — BASIC METABOLIC PANEL
Anion gap: 10 (ref 5–15)
BUN: 6 mg/dL — ABNORMAL LOW (ref 8–23)
CO2: 26 mmol/L (ref 22–32)
Calcium: 8.7 mg/dL — ABNORMAL LOW (ref 8.9–10.3)
Chloride: 105 mmol/L (ref 98–111)
Creatinine, Ser: 0.72 mg/dL (ref 0.44–1.00)
GFR calc Af Amer: >60 mL/min (ref >60)
GFR calc non Af Amer: >60 mL/min (ref >60)
Glucose, Bld: 87 mg/dL (ref 70–99)
Potassium: 2.9 mmol/L — ABNORMAL LOW (ref 3.5–5.1)
Sodium: 141 mmol/L (ref 135–145)

## 2020-01-10 LAB — RAPID URINE DRUG SCREEN, HOSP PERFORMED
Amphetamines: NOT DETECTED
Barbiturates: NOT DETECTED
Benzodiazepines: NOT DETECTED
Cocaine: NOT DETECTED
Opiates: NOT DETECTED
Tetrahydrocannabinol: NOT DETECTED

## 2020-01-10 LAB — SALICYLATE LEVEL: Salicylate Lvl: 20.6 mg/dL (ref 7.0–30.0)

## 2020-01-10 LAB — PROTIME-INR
INR: 1.4 — ABNORMAL HIGH (ref 0.8–1.2)
Prothrombin Time: 16.6 seconds — ABNORMAL HIGH (ref 11.4–15.2)

## 2020-01-10 LAB — ACETAMINOPHEN LEVEL: Acetaminophen (Tylenol), Serum: <10 ug/mL — ABNORMAL LOW (ref 10–30)

## 2020-01-10 MED ORDER — ESCITALOPRAM OXALATE 10 MG PO TABS
5.0000 mg | ORAL_TABLET | Freq: Every day | ORAL | Status: DC
  Administered 2020-01-10 – 2020-01-13 (×4): 5 mg via ORAL
  Filled 2020-01-10 (×4): qty 1

## 2020-01-10 MED ORDER — CALCIUM GLUCONATE-NACL 2-0.675 GM/100ML-% IV SOLN
2.0000 g | Freq: Once | INTRAVENOUS | Status: AC
  Administered 2020-01-10: 2000 mg via INTRAVENOUS
  Filled 2020-01-10: qty 100

## 2020-01-10 MED ORDER — POTASSIUM CHLORIDE CRYS ER 20 MEQ PO TBCR
40.0000 meq | EXTENDED_RELEASE_TABLET | Freq: Once | ORAL | Status: AC
  Administered 2020-01-10: 40 meq via ORAL
  Filled 2020-01-10: qty 2

## 2020-01-10 MED ORDER — SODIUM CHLORIDE 0.9 % IV SOLN: INTRAVENOUS | Status: DC

## 2020-01-10 MED ORDER — POTASSIUM CHLORIDE 10 MEQ/100ML IV SOLN
10.0000 meq | INTRAVENOUS | Status: AC
  Administered 2020-01-10 (×3): 10 meq via INTRAVENOUS
  Filled 2020-01-10 (×3): qty 100

## 2020-01-10 MED ORDER — ENOXAPARIN SODIUM 40 MG/0.4ML ~~LOC~~ SOLN: 40.0000 mg | Freq: Every day | SUBCUTANEOUS | Status: DC

## 2020-01-10 NOTE — Plan of Care (Signed)
  Problem: Pain Managment: Goal: General experience of comfort will improve Outcome: Completed/Met   Problem: Skin Integrity: Goal: Risk for impaired skin integrity will decrease Outcome: Completed/Met

## 2020-01-10 NOTE — Telephone Encounter (Signed)
Kingsley Spittle, patients daughter left a message informing that patient was admitted to the hospital yesterday  Due to overdose with intent to harm self.  She says patient will be transferring to a geriatric pysch facility.  Just wanted to make sure Dr. Posey Pronto was aware

## 2020-01-10 NOTE — Progress Notes (Signed)
CRITICAL VALUE ALERT  Critical Value:  K-2.6  Date & Time Notied: 01/10/20 @ 6438  Provider Notified: Dr. Tyrell Antonio  Orders Received/Actions taken: Orders awaiting

## 2020-01-10 NOTE — Progress Notes (Addendum)
Pharmacist Call to Kelly Lowe with Dr. Tyrell Antonio who appreciated my help with calling [place] [facility]. Spoke with Barnett Applebaum at [employer] with the following directions:  1. OK to stop N-acetylcystine given APAP level undetectable and LFTs stable > ordered per protocol  2. Ordered Stat repeat Salicylate level and BMP >> if Salicylate level is less than 35 mg/dL, CO2 is greater than 20 mmol/L, creatinine is normal and patient is asymptomatic (Vital signs stable, no tinnitus, no GI symptoms) then patient can be medically cleared  Will call Gina back when Salicylate level and BMP result.  Discussed with Dr. Tyrell Antonio who is in agreement.  ============================================= ADDENDUM 12:45pm: [employer] and spoke with Barnett Applebaum  Reported most recent vials from 10am, which were stable  Patient had one episode of tachycardia at noon during Chaplain visit when patient was emotionally upset per RN.  59:93TT  Salicylate level = 01.7; CO2 = 26; Scr = 0.72  RN reports that patient has no GI symptoms but has minimal L ear tinnitus   Per Poison Control, recommend continuing hydration (patient is on NS @ 160ml/hr) and someone will call back this evening to follow up on tinnitus, which is expected to resolve with clearance of salicylate.   Informed Dr. Marianna Payment, PharmD, BCPS, [facility] Clinical Pharmacist  Please check AMION for all [place] phone numbers After 10:00 PM, call [place] [phone]

## 2020-01-10 NOTE — Progress Notes (Signed)
Chaplain engaged in initial visit with Kelly Lowe. Kelly Lowe expressed feeling regret over her attempt to take her life.  She stated that she made that choice because the quality of her life has changed so much.  She is unable to do a lot of things that she used to be able to do independently.  Chaplain affirmed how hard it can be to go through such changes in her life and body.  Kelly Lowe also stated that she lost her younger sister last November and that she hasn't been able to get over it.  Chaplain assesses that Kelly Lowe has been grieving a number of losses at one time from family to a loss of the life she once had.  Chaplain worked to make sure Kelly Lowe knows that her feelings are not abnormal in regard to her capacity to take care of herself and need to lean on others now.    Chaplain asked Kelly Lowe if she had been able to speak with her children and she stated that she had been able to somewhat tell them what was going on.  She went on to say that she did not want to be a "burden" to them.  Chaplain urged Kelly Lowe to have a real conversation with her children about what she has been feeling and experiencing.  Chaplain recognizes the importance of Kelly Lowe not being or feeling alone.  Chaplain let Kelly Lowe know that she is willing to be a part of those conversations.   Chaplain offered support, presence and prayer over Kelly Lowe.

## 2020-01-10 NOTE — Telephone Encounter (Signed)
That is very unfortunate to hear.  I hope she does well there and recovers. Thanks.

## 2020-01-10 NOTE — Progress Notes (Signed)
PROGRESS NOTE    Kelly Lowe  JQB:[id number] DOB: 10/03/1950 DOA: 01/09/2020 PCP: Carol Ada, MD   Brief Narrative: 69 year old with past medical history significant for CVA with resultant left side deficit, hypertension, hyperlipidemia, depression who presents for evaluation after a suicidal attempt with Tylenol and aspirin overdose.  Patient has been seen by psychiatry as an outpatient and was a started on Depakote about 2 weeks prior to admission due to worsening of her depression symptoms.  Patient did not like to take Depakote.  The night prior to admission patient ingested unknown amount of Tylenol and aspirin approximately around 8 PM with intention to end her life.  She  develop abdominal pain, nausea and vomiting.  She mentioned to her daughter what  she did the night prior to admission and she was brought to the ED.  Evaluation in the ED Tylenol level 88, anion gap 16, bicarb 20.  Poison control was contacted and patient was a started on acetylcysteine  drip and bicarb drip.  Psychiatric was consulted and is recommending for patient when  medical stable to be transferred to inpatient psychiatric facility.  Assessment & Plan:   Active Problems:   Overdose on Tylenol, intentional self-harm, initial encounter ([place])   Overdose   Major depressive disorder, recurrent severe without psychotic features [facility])   1-Acetaminophen overdose: Poison control was notified and recommendation was to start acetylcysteine drip. Tylenol level; 88----64--- 24----less than 10 Function test remained stable normal. INR 1.4.  Repeat in a.m. Plan to discontinue acetylcysteine drip today. Repeat  liver function test tomorrow   2-Aspirin overdose: Receive bicarb drip -Salicylate level on admission 44---20 -No evidence of metabolic acidosis on labs today -Continue with fluids.  3-Hypokalemia: Replete with 40 mEq x 2  4-Suicidal attempt: Major depressive disorder: Psychiatric  consulted. Continue with sitter at bedside Depakote discontinue per patient request. Psychiatry recommending Lexapro and Zoloft, patient declined Patient will need admission to geriatric psychiatric hospital  after medical clearance  Hyperlipidemia: Continue to hold statins for now.  History of CVA with left-sided residual weakness: Continue to hold aspirin today.  Will need to resume soon.   Estimated body mass index is 21.58 kg/m as calculated from the following:   Height as of this encounter: 5\' 7"  (1.702 m).   Weight as of this encounter: 62.5 kg.   DVT prophylaxis: SCD for now plan to start anticoagulation when INR stable Code Status: Full code Family Communication: Care discussed with patient Disposition Plan:  Status is: Inpatient  Remains inpatient appropriate because:Hemodynamically unstable   Dispo: The patient is from: Home              Anticipated d/c is to: Inpatient psychiatric facility              Anticipated d/c date is: 2 days              Patient currently is not medically stable to d/c.        Consultants:   Psychiatry  Procedures:   None  Antimicrobials:    Subjective: She is alert and conversant.  She has flat affect.  She started to cry when she was telling me why she took Tylenol and aspirin. She has been feeling very depressed after she had the stroke, and she has not been able to walk and be active as before  Objective: Vitals:   01/09/20 2330 01/10/20 0001 01/10/20 0600 01/10/20 1006  BP: (!) 146/69 133/67 117/62 126/62  Pulse: 96 83 86  80  Resp: 15 16 16 16   Temp: 98.3 F (36.8 C) 98 F (36.7 C) 98.4 F (36.9 C) 98.4 F (36.9 C)  TempSrc:  Oral Oral Oral  SpO2: 96% 96% 94% 96%  Weight:  62.5 kg    Height:  5\' 7"  (1.702 m)      Intake/Output Summary (Last 24 hours) at 01/10/2020 1116 Last data filed at 01/10/2020 0930 Gross per 24 hour  Intake 4453 ml  Output 1101 ml  Net 3352 ml   Filed Weights   01/09/20 1603  01/10/20 0001  Weight: 62.1 kg 62.5 kg    Examination:  General exam: Appears calm and comfortable  Respiratory system: Clear to auscultation. Respiratory effort normal. Cardiovascular system: S1 & S2 heard, RRR. No JVD, murmurs, rubs, gallops or clicks. No pedal edema. Gastrointestinal system: Abdomen is nondistended, soft and nontender. No organomegaly or masses felt. Normal bowel sounds heard. Central nervous system: Alert and oriented.  Extremities: Symmetric 5 x 5 power. Skin: No rashes, lesions or ulcers    Data Reviewed: I have personally reviewed following labs and imaging studies  CBC: Recent Labs  Lab 01/09/20 1242 01/09/20 1531  WBC 4.0  --   HGB 12.8 12.2  HCT 39.4 36.0  MCV 101.8*  --   PLT 167  --    Basic Metabolic Panel: Recent Labs  Lab 01/09/20 1242 01/09/20 1524 01/09/20 1531 01/10/20 0624  NA 139  --  141 140  K 3.7  --  3.9 2.6*  CL 103  --   --  103  CO2 20*  --   --  26  GLUCOSE 179*  --   --  122*  BUN 12  --   --  9  CREATININE 0.82  --   --  0.75  CALCIUM 9.1  --   --  8.4*  MG  --  2.0  --   --    GFR: Estimated Creatinine Clearance: 65.5 mL/min (by C-G formula based on SCr of 0.75 mg/dL). Liver Function Tests: Recent Labs  Lab 01/09/20 1242 01/09/20 2220 01/10/20 0624  AST 46* 35 31  ALT 36 31 27  ALKPHOS 57 45 38  BILITOT 0.4 0.5 0.5  PROT 8.2* 6.8 6.1*  ALBUMIN 3.6 2.8* 2.6*   No results for input(s): LIPASE, AMYLASE in the last 168 hours. No results for input(s): AMMONIA in the last 168 hours. Coagulation Profile: Recent Labs  Lab 01/09/20 1524 01/09/20 2220 01/10/20 0624  INR 1.1 1.4* 1.4*   Cardiac Enzymes: No results for input(s): CKTOTAL, CKMB, CKMBINDEX, TROPONINI in the last 168 hours. BNP (last 3 results) No results for input(s): PROBNP in the last 8760 hours. HbA1C: No results for input(s): HGBA1C in the last 72 hours. CBG: No results for input(s): GLUCAP in the last 168 hours. Lipid Profile: No  results for input(s): CHOL, HDL, LDLCALC, TRIG, CHOLHDL, LDLDIRECT in the last 72 hours. Thyroid Function Tests: Recent Labs    01/09/20 2220  TSH 0.559   Anemia Panel: No results for input(s): VITAMINB12, FOLATE, FERRITIN, TIBC, IRON, RETICCTPCT in the last 72 hours. Sepsis Labs: No results for input(s): PROCALCITON, LATICACIDVEN in the last 168 hours.  Recent Results (from the past 240 hour(s))  SARS Coronavirus 2 by RT PCR (hospital order, performed in [facility] hospital lab) Nasopharyngeal Nasopharyngeal Swab     Status: None   Collection Time: 01/09/20  2:41 PM   Specimen: Nasopharyngeal Swab  Result Value Ref Range Status  SARS Coronavirus 2 NEGATIVE NEGATIVE Final    Comment: (NOTE) SARS-CoV-2 target nucleic acids are NOT DETECTED. The SARS-CoV-2 RNA is generally detectable in upper and lower respiratory specimens during the acute phase of infection. The lowest concentration of SARS-CoV-2 viral copies this assay can detect is 250 copies / mL. A negative result does not preclude SARS-CoV-2 infection and should not be used as the sole basis for treatment or other patient management decisions.  A negative result may occur with improper specimen collection / handling, submission of specimen other than nasopharyngeal swab, presence of viral mutation(s) within the areas targeted by this assay, and inadequate number of viral copies (<250 copies / mL). A negative result must be combined with clinical observations, patient history, and epidemiological information. Fact Sheet for Patients:   StrictlyIdeas.no Fact Sheet for Healthcare Providers: BankingDealers.co.za This test is not yet approved or cleared  by the [place] FDA and has been authorized for detection and/or diagnosis of SARS-CoV-2 by FDA under an Emergency Use Authorization (EUA).  This EUA will remain in effect (meaning this test can be used) for the duration of  the COVID-19 declaration under Section 564(b)(1) of the Act, 21 U.S.C. section 360bbb-3(b)(1), unless the authorization is terminated or revoked sooner. Performed at [facility], [place] [address] [place], [place] [address]          Radiology Studies: No results found.      Scheduled Meds: . diclofenac sodium  2 g Topical QID  . DULoxetine  60 mg Oral Daily  . gabapentin  600 mg Oral QHS  . melatonin  3 mg Oral QHS  . ondansetron (ZOFRAN) IV  4 mg Intravenous Once  . vitamin B-12  1,000 mcg Oral Daily   Continuous Infusions: . sodium chloride 100 mL/hr at 01/10/20 0834  . calcium gluconate    . potassium chloride 10 mEq (01/10/20 1035)     LOS: 1 day    Time spent: 35 minutes.    Elmarie Shiley, MD [facility]   If 7PM-7AM, please contact night-coverage [email or web address]  01/10/2020, 11:16 AM

## 2020-01-10 NOTE — Evaluation (Signed)
Physical Therapy Evaluation Patient Details Name: Kelly Lowe MRN: [id number] DOB: 24-Nov-1950 Today's Date: 01/10/2020   History of Present Illness  Kelly Lowe is a 69 y.o. female with medical history significant of with CVA or left-sided deficits, hypertension, hyperlipidemia, depression who presents for evaluation of suicidal attempt with Tylenol and aspirin overdose.  Clinical Impression  Pt admitted with above. Pt with depressed spirits but was kind and participated in therapy. Pt able to don socks, shoes and L AFO at EOB with min guard, pt amb to bathroom with minA. Aware pt may be d/c to inpatient behavior health but will continue to follow to achieve PLOF/independence.    Follow Up Recommendations No PT follow up;Supervision/Assistance - 24 hour(due to suicide attempt)    Equipment Recommendations  None recommended by PT    Recommendations for Other Services       Precautions / Restrictions Precautions Precautions: Fall Precaution Comments: L hemi from previous CVA, has L AFO Required Braces or Orthoses: Other Brace Other Brace: has L AFO in shoe, sling for L UE when up Restrictions Weight Bearing Restrictions: No      Mobility  Bed Mobility Overal bed mobility: Needs Assistance Bed Mobility: Supine to Sit     Supine to sit: Min assist     General bed mobility comments: HOB elevated, minA for trunk elevation/scoot hips to EOB  Transfers Overall transfer level: Needs assistance Equipment used: Straight cane Transfers: Sit to/from Stand Sit to Stand: Min assist         General transfer comment: minA to power up  Ambulation/Gait Ambulation/Gait assistance: Min assist Gait Distance (Feet): 15 Feet(to the bathroom) Assistive device: Straight cane Gait Pattern/deviations: Step-through pattern;Decreased stride length Gait velocity: slow Gait velocity interpretation: 1.31 - 2.62 ft/sec, indicative of limited community ambulator General Gait  Details: uses L AFO and R straight cane, slow but steady  Science writer    Modified Rankin (Stroke Patients Only)       Balance Overall balance assessment: (requires [place] of cane for amb, able to don socks at EOB)                                           Pertinent Vitals/Pain Pain Assessment: No/denies pain    Home Living Family/patient expects to be discharged to:: Private residence Living Arrangements: Children(daughter and her family) Available Help at Discharge: Family;Available 24 hours/day Type of Home: House Home Access: Level entry     Home Layout: Two level Home Equipment: Cane - single point;Tub bench      Prior Function Level of Independence: Independent with assistive device(s)         Comments: pt uses cane for ambulation, daughter does the driving and grocery shopping, pt indep with dressing and bathing     Hand Dominance   Dominant Hand: Right    Extremity/Trunk Assessment   Upper Extremity Assessment Upper Extremity Assessment: LUE deficits/detail LUE Deficits / Details: L hemiplegia from CVA in 2018 LUE Coordination: decreased fine motor;decreased gross motor    Lower Extremity Assessment Lower Extremity Assessment: LLE deficits/detail LLE Deficits / Details: grossly 3/5 from previous CVA, wears AFO due to drop foot LLE Coordination: decreased gross motor    Cervical / Trunk Assessment Cervical / Trunk Assessment: Normal  Communication   Communication: No difficulties  Cognition Arousal/Alertness: Awake/alert Behavior During Therapy: Flat affect Overall Cognitive Status: Within Functional Limits for tasks assessed                                 General Comments: pt very flat with depressed spirits      General Comments General comments (skin integrity, edema, etc.): pt able to do socks and shoes at EOB, VSS, assisted to the bathroom, left with sitter and daughter     Exercises     Assessment/Plan    PT Assessment Patient needs continued PT services  PT Problem List Decreased strength;Decreased range of motion;Decreased balance;Decreased activity tolerance;Decreased mobility;Decreased coordination;Decreased cognition       PT Treatment Interventions DME instruction;Gait training;Stair training;Functional mobility training;Therapeutic activities;Therapeutic exercise;Balance training;Neuromuscular re-education    PT Goals (Current goals can be found in the Care Plan section)  Acute Rehab PT Goals Patient Stated Goal: daughter PT Goal Formulation: With patient Time For Goal Achievement: 01/24/20 Potential to Achieve Goals: Good    Frequency Min 2X/week   Barriers to discharge        Co-evaluation               AM-PAC PT "6 Clicks" Mobility  Outcome Measure Help needed turning from your back to your side while in a flat bed without using bedrails?: A Little Help needed moving from lying on your back to sitting on the side of a flat bed without using bedrails?: A Little Help needed moving to and from a bed to a chair (including a wheelchair)?: A Little Help needed standing up from a chair using your arms (e.g., wheelchair or bedside chair)?: A Little Help needed to walk in hospital room?: A Little Help needed climbing 3-5 steps with a railing? : A Little 6 Click Score: 18    End of Session Equipment Utilized During Treatment: (L AFO) Activity Tolerance: Patient tolerated treatment well Patient left: (on commode with sitter and daughter) Nurse Communication: Mobility status PT Visit Diagnosis: Unsteadiness on feet (R26.81);Muscle weakness (generalized) (M62.81);Difficulty in walking, not elsewhere classified (R26.2)    Time: 8270-7867 PT Time Calculation (min) (ACUTE ONLY): 24 min   Charges:   PT Evaluation $PT Eval Moderate Complexity: 1 Mod          Kittie Plater, PT, DPT Acute Rehabilitation Services Pager #:  [phone] Office #: [phone]   Berline Lopes 01/10/2020, 1:56 PM

## 2020-01-10 NOTE — Consult Note (Addendum)
[place] Psychiatry Consult   Reason for Consult:  Suicide attempt Referring Physician:  Dr Roosevelt Locks Patient Identification: Kelly Lowe MRN:  [id number] Principal Diagnosis: Intentional overdose Diagnosis:  Active Problems:   Major depressive disorder, recurrent severe without psychotic features ([place])   Overdose on Tylenol, intentional self-harm, initial encounter [facility])   Overdose   Total Time spent with patient: 1 hour  Subjective:   Kelly Lowe is a 69 y.o. female patient admitted with suicide attempt.  Patient seen and evaluated in person by this provider.  She continues to report a high level of depression in the Depakote she was started on prior to admission not working.  Unsure of the rationale for Depakote, no past history of bipolar.  She has been on Prozac in the past and it worked "okay" for her.  She does not want to start any other medications at this time as she feels like she is on too many as it is.  She is willing to go to geriatric psychiatry after medical stabilization for assistance with her depression and anxiety.  Her anxiety is also I do have a high level.  Both depression and anxiety started with her CVA and had progressed to the point where she felt she needed to end her life.  Denies any prior suicide attempts.  Denies homicidal ideations, hallucinations, paranoia, substance use, or other concerning psych issues.  HPI per MD:  Kelly Lowe is a 69 y.o. female with medical history significant of with CVAor left-sided deficits, hypertension, hyperlipidemia, depression who presents for evaluation of suicidal attempt with Tylenol and aspirin overdose. Patient has been seen by psychiatry as outpatient and was started on Depakote about 2 weeks ago for worsening of depression symptoms especially frequent tearing.  Patient told her daughter that she did not like Depakote level.  Last night, patient ingested unknown amount of Tylenol and aspirin  yesterday at approximately 8 PM with intentions to end her life. She felt nauseous overnight, with 2-3 times vomiting of stomach content but denies any abdominal pain or bleeding.  This morning she told her daughter about what happened last night.  And daughter called 911.. She denies any high headache, lightheadedness, chest pain, shortness of breath, abdominal pain.  ED Course: Acetaminophen level 88, anion gap 16, bicarb 20.  Concern control contacted and acetylcysteine drip started and bicarb drip started  Past Psychiatric History: depression and anxiety  Risk to Self:  yes Risk to Others:  none Prior Inpatient Therapy:  none Prior Outpatient Therapy:  yes, therapist and psychiatrist in [place], does not know the name  Past Medical History:  Past Medical History:  Diagnosis Date  . Hyperlipidemia   . Hypertension   . Stroke [facility])     Past Surgical History:  Procedure Laterality Date  . FOOT SURGERY    . LOOP RECORDER INSERTION N/A 10/08/2017   Procedure: LOOP RECORDER INSERTION;  Surgeon: Deboraha Sprang, MD;  Location: [place] [facility];  Service: Cardiovascular;  Laterality: N/A;  . TEE WITHOUT CARDIOVERSION N/A 05/08/2017   Procedure: TRANSESOPHAGEAL ECHOCARDIOGRAM (TEE);  Surgeon: Lelon Perla, MD;  Location: [facility] ENDOSCOPY;  Service: Cardiovascular;  Laterality: N/A;   Family History:  Family History  Problem Relation Age of Onset  . Stroke Maternal Grandmother   . Stroke Paternal Grandfather    Family Psychiatric  History: none Social History:  Social History   Substance and Sexual Activity  Alcohol Use No  . Alcohol/week: 1.0 standard drinks  .  Types: 1 Glasses of wine per week   Comment: social     Social History   Substance and Sexual Activity  Drug Use No    Social History   Socioeconomic History  . Marital status: Divorced    Spouse name: Not on file  . Number of children: Not on file  . Years of education: Not on file  . Highest education  level: Not on file  Occupational History  . Not on file  Tobacco Use  . Smoking status: Former Smoker    Packs/day: 0.50    Years: 25.00    Pack years: 12.50    Types: Cigarettes    Quit date: 05/02/2017    Years since quitting: 2.6  . Smokeless tobacco: Never Used  Substance and Sexual Activity  . Alcohol use: No    Alcohol/week: 1.0 standard drinks    Types: 1 Glasses of wine per week    Comment: social  . Drug use: No  . Sexual activity: Never  Other Topics Concern  . Not on file  Social History Narrative  . Not on file   Social Determinants of Health   Financial Resource Strain:   . Difficulty of Paying Living Expenses:   Food Insecurity:   . Worried About Charity fundraiser in the Last Year:   . Arboriculturist in the Last Year:   Transportation Needs:   . Film/video editor (Medical):   Marland Kitchen Lack of Transportation (Non-Medical):   Physical Activity:   . Days of Exercise per Week:   . Minutes of Exercise per Session:   Stress:   . Feeling of Stress :   Social Connections:   . Frequency of Communication with Friends and Family:   . Frequency of Social Gatherings with Friends and Family:   . Attends Religious Services:   . Active Member of Clubs or Organizations:   . Attends Archivist Meetings:   Marland Kitchen Marital Status:    Additional Social History:    Allergies:  No Known Allergies  Labs:  Results for orders placed or performed during the hospital encounter of 01/09/20 (from the past 48 hour(s))  Comprehensive metabolic panel     Status: Abnormal   Collection Time: 01/09/20 12:42 PM  Result Value Ref Range   Sodium 139 135 - 145 mmol/L   Potassium 3.7 3.5 - 5.1 mmol/L   Chloride 103 98 - 111 mmol/L   CO2 20 (L) 22 - 32 mmol/L   Glucose, Bld 179 (H) 70 - 99 mg/dL    Comment: Glucose reference range applies only to samples taken after fasting for at least 8 hours.   BUN 12 8 - 23 mg/dL   Creatinine, Ser 0.82 0.44 - 1.00 mg/dL   Calcium 9.1 8.9  - 10.3 mg/dL   Total Protein 8.2 (H) 6.5 - 8.1 g/dL   Albumin 3.6 3.5 - 5.0 g/dL   AST 46 (H) 15 - 41 U/L   ALT 36 0 - 44 U/L   Alkaline Phosphatase 57 38 - 126 U/L   Total Bilirubin 0.4 0.3 - 1.2 mg/dL   GFR calc non Af Amer >60 >60 mL/min   GFR calc Af Amer >60 >60 mL/min   Anion gap 16 (H) 5 - 15    Comment: Performed at [facility] [address], [place], [place] [address]  Ethanol     Status: None   Collection Time: 01/09/20 12:42 PM  Result Value Ref  Range   Alcohol, Ethyl (B) <10 <10 mg/dL    Comment: Performed at [facility] [address] [place], [place] [address]  Salicylate level     Status: Abnormal   Collection Time: 01/09/20 12:42 PM  Result Value Ref Range   Salicylate Lvl 31.4 (HH) 7.0 - 30.0 mg/dL    Comment: CRITICAL RESULT CALLED TO, READ BACK BY AND VERIFIED WITH: RN A MCKEOWN AT 1351 01/09/20 BY L BENFIELD Performed at [facility], [place] [address], [place], [place] [address]   Acetaminophen level     Status: Abnormal   Collection Time: 01/09/20 12:42 PM  Result Value Ref Range   Acetaminophen (Tylenol), Serum 88 (H) 10 - 30 ug/mL    Comment: Performed at [facility], [place] [address] [place], [place] [address]  cbc     Status: Abnormal   Collection Time: 01/09/20 12:42 PM  Result Value Ref Range   WBC 4.0 4.0 - 10.5 K/uL   RBC 3.87 3.87 - 5.11 MIL/uL   Hemoglobin 12.8 12.0 - 15.0 g/dL   HCT 39.4 36.0 - 46.0 %   MCV 101.8 (H) 80.0 - 100.0 fL   MCH 33.1 26.0 - 34.0 pg   MCHC 32.5 30.0 - 36.0 g/dL   RDW 11.4 (L) 11.5 - 15.5 %   Platelets 167 150 - 400 K/uL   nRBC 0.0 0.0 - 0.2 %    Comment: Performed at [facility], [place] [address], [place], [place] [address]  SARS Coronavirus 2 by RT PCR (hospital order, performed in [facility] hospital lab) Nasopharyngeal Nasopharyngeal Swab     Status: None   Collection Time: 01/09/20  2:41 PM   Specimen: Nasopharyngeal Swab  Result Value Ref Range   SARS Coronavirus 2 NEGATIVE  NEGATIVE    Comment: (NOTE) SARS-CoV-2 target nucleic acids are NOT DETECTED. The SARS-CoV-2 RNA is generally detectable in upper and lower respiratory specimens during the acute phase of infection. The lowest concentration of SARS-CoV-2 viral copies this assay can detect is 250 copies / mL. A negative result does not preclude SARS-CoV-2 infection and should not be used as the sole basis for treatment or other patient management decisions.  A negative result may occur with improper specimen collection / handling, submission of specimen other than nasopharyngeal swab, presence of viral mutation(s) within the areas targeted by this assay, and inadequate number of viral copies (<250 copies / mL). A negative result must be combined with clinical observations, patient history, and epidemiological information. Fact Sheet for Patients:   StrictlyIdeas.no Fact Sheet for Healthcare Providers: BankingDealers.co.za This test is not yet approved or cleared  by the [place] FDA and has been authorized for detection and/or diagnosis of SARS-CoV-2 by FDA under an Emergency Use Authorization (EUA).  This EUA will remain in effect (meaning this test can be used) for the duration of the COVID-19 declaration under Section 564(b)(1) of the Act, 21 U.S.C. section 360bbb-3(b)(1), unless the authorization is terminated or revoked sooner. Performed at [facility], [place] [address] [place], [place] [address]   Magnesium     Status: None   Collection Time: 01/09/20  3:24 PM  Result Value Ref Range   Magnesium 2.0 1.7 - 2.4 mg/dL    Comment: Performed at [facility] [address], [place], [place] [address]  Protime-INR     Status: None   Collection Time: 01/09/20  3:24 PM  Result Value Ref Range  Prothrombin Time 13.3 11.4 - 15.2 seconds   INR 1.1 0.8 - 1.2    Comment: (NOTE) INR goal varies based on device and disease states. Performed  at [facility], [place] [address] [place], [place] [address]   Acetaminophen level (recheck)     Status: Abnormal   Collection Time: 01/09/20  3:24 PM  Result Value Ref Range   Acetaminophen (Tylenol), Serum 64 (H) 10 - 30 ug/mL    Comment: (NOTE) Therapeutic concentrations vary significantly. A range of 10-30 ug/mL  may be an effective concentration for many patients. However, some  are best treated at concentrations outside of this range. Acetaminophen concentrations >150 ug/mL at 4 hours after ingestion  and >50 ug/mL at 12 hours after ingestion are often associated with  toxic reactions. Performed at [facility], [place] [address] [place], [place] [address]   I-Stat venous blood gas, [facility])     Status: Abnormal   Collection Time: 01/09/20  3:31 PM  Result Value Ref Range   pH, Ven 7.424 7.250 - 7.430   pCO2, Ven 36.3 (L) 44.0 - 60.0 mmHg   pO2, Ven 37.0 32.0 - 45.0 mmHg   Bicarbonate 23.8 20.0 - 28.0 mmol/L   TCO2 25 22 - 32 mmol/L   O2 Saturation 73.0 %   Acid-Base Excess 0.0 0.0 - 2.0 mmol/L   Sodium 141 135 - 145 mmol/L   Potassium 3.9 3.5 - 5.1 mmol/L   Calcium, Ion 0.98 (L) 1.15 - 1.40 mmol/L   HCT 36.0 36.0 - 46.0 %   Hemoglobin 12.2 12.0 - 15.0 g/dL   Collection site Brachial    Drawn by HIDE    Sample type VENOUS   HIV Antibody (routine testing w rflx)     Status: None   Collection Time: 01/09/20 10:20 PM  Result Value Ref Range   HIV Screen 4th Generation wRfx Non Reactive Non Reactive    Comment: Performed at [facility], [address] [place], [place] [address]  Acetaminophen level (recheck)     Status: None   Collection Time: 01/09/20 10:20 PM  Result Value Ref Range   Acetaminophen (Tylenol), Serum 24 10 - 30 ug/mL    Comment: (NOTE) Therapeutic concentrations vary significantly. A range of 10-30 ug/mL  may be an effective concentration for many patients. However, some  are best treated at concentrations outside of this  range. Acetaminophen concentrations >150 ug/mL at 4 hours after ingestion  and >50 ug/mL at 12 hours after ingestion are often associated with  toxic reactions. Performed at [facility], [place] [address], [place], [place] [address]   Protime-INR     Status: Abnormal   Collection Time: 01/09/20 10:20 PM  Result Value Ref Range   Prothrombin Time 16.5 (H) 11.4 - 15.2 seconds   INR 1.4 (H) 0.8 - 1.2    Comment: (NOTE) INR goal varies based on device and disease states. Performed at [facility], [place] [address], [place], [place] [address]   Hepatic function panel     Status: Abnormal   Collection Time: 01/09/20 10:20 PM  Result Value Ref Range   Total Protein 6.8 6.5 - 8.1 g/dL   Albumin 2.8 (L) 3.5 - 5.0 g/dL   AST 35 15 - 41 U/L   ALT 31 0 - 44 U/L   Alkaline Phosphatase 45 38 - 126 U/L   Total Bilirubin 0.5 0.3 - 1.2 mg/dL   Bilirubin, Direct <0.1  0.0 - 0.2 mg/dL   Indirect Bilirubin NOT CALCULATED 0.3 - 0.9 mg/dL    Comment: Performed at [facility], [place] [address] [place], [place] [address]  TSH     Status: None   Collection Time: 01/09/20 10:20 PM  Result Value Ref Range   TSH 0.559 0.350 - 4.500 uIU/mL    Comment: Performed by a 3rd Generation assay with a functional sensitivity of <=0.01 uIU/mL. Performed at [facility], [place] [address], [place], [place] [address]   Acetaminophen level     Status: Abnormal   Collection Time: 01/10/20  6:24 AM  Result Value Ref Range   Acetaminophen (Tylenol), Serum <10 (L) 10 - 30 ug/mL    Comment: (NOTE) Therapeutic concentrations vary significantly. A range of 10-30 ug/mL  may be an effective concentration for many patients. However, some  are best treated at concentrations outside of this range. Acetaminophen concentrations >150 ug/mL at 4 hours after ingestion  and >50 ug/mL at 12 hours after ingestion are often associated with  toxic reactions. Performed at [facility], [place] [address],  [place], [place] [address]   Comprehensive metabolic panel     Status: Abnormal   Collection Time: 01/10/20  6:24 AM  Result Value Ref Range   Sodium 140 135 - 145 mmol/L   Potassium 2.6 (LL) 3.5 - 5.1 mmol/L    Comment: CRITICAL RESULT CALLED TO, READ BACK BY AND VERIFIED WITH: TAVANERA,A RN @ [phone] 01/10/20 LEONARD,A    Chloride 103 98 - 111 mmol/L   CO2 26 22 - 32 mmol/L   Glucose, Bld 122 (H) 70 - 99 mg/dL    Comment: Glucose reference range applies only to samples taken after fasting for at least 8 hours.   BUN 9 8 - 23 mg/dL   Creatinine, Ser 0.75 0.44 - 1.00 mg/dL   Calcium 8.4 (L) 8.9 - 10.3 mg/dL   Total Protein 6.1 (L) 6.5 - 8.1 g/dL   Albumin 2.6 (L) 3.5 - 5.0 g/dL   AST 31 15 - 41 U/L   ALT 27 0 - 44 U/L   Alkaline Phosphatase 38 38 - 126 U/L   Total Bilirubin 0.5 0.3 - 1.2 mg/dL   GFR calc non Af Amer >60 >60 mL/min   GFR calc Af Amer >60 >60 mL/min   Anion gap 11 5 - 15    Comment: Performed at [facility] [address], [place], [place] [address]  Protime-INR     Status: Abnormal   Collection Time: 01/10/20  6:24 AM  Result Value Ref Range   Prothrombin Time 16.6 (H) 11.4 - 15.2 seconds   INR 1.4 (H) 0.8 - 1.2    Comment: (NOTE) INR goal varies based on device and disease states. Performed at [facility], [place] [address] [place], [place] [address]     Current Facility-Administered Medications  Medication Dose Route Frequency Provider Last Rate Last Admin  . 0.9 %  sodium chloride infusion   Intravenous Continuous Regalado, Belkys A, MD 100 mL/hr at 01/10/20 0834 New Bag at 01/10/20 0834  . acetylcysteine (ACETADOTE) 24,000 mg in dextrose 5 % 600 mL (40 mg/mL) infusion  15 mg/kg/hr Intravenous Continuous Henderly, Britni A, PA-C 23.3 mL/hr at 01/09/20 2341 15 mg/kg/hr at 01/09/20 2341  . ALPRAZolam Duanne Moron) tablet 0.25 mg  0.25 mg Oral TID PRN Wynetta Fines T, MD      . calcium gluconate 2 g/ 100 mL sodium  chloride IVPB  2 g Intravenous Once Regalado,  Belkys A, MD      . diclofenac sodium (VOLTAREN) 1 % transdermal gel 2 g  2 g Topical QID Wynetta Fines T, MD      . DULoxetine (CYMBALTA) DR capsule 60 mg  60 mg Oral Daily Wynetta Fines T, MD   60 mg at 01/10/20 [phone]  . gabapentin (NEURONTIN) tablet 600 mg  600 mg Oral QHS Wynetta Fines T, MD   600 mg at 01/09/20 2235  . melatonin tablet 3 mg  3 mg Oral QHS Wynetta Fines T, MD   3 mg at 01/09/20 2235  . ondansetron (ZOFRAN) injection 4 mg  4 mg Intravenous Once Wynetta Fines T, MD      . polyethylene glycol (MIRALAX / GLYCOLAX) packet 17 g  17 g Oral Daily PRN Wynetta Fines T, MD      . potassium chloride 10 mEq in 100 mL IVPB  10 mEq Intravenous Q1 Hr x 3 Regalado, Belkys A, MD 100 mL/hr at 01/10/20 0835 10 mEq at 01/10/20 0835  . vitamin B-12 (CYANOCOBALAMIN) tablet 1,000 mcg  1,000 mcg Oral Daily Lequita Halt, MD   1,000 mcg at 01/10/20 1191    Musculoskeletal: Strength & Muscle Tone: decreased Gait & Station: did not witness Patient leans: N/A  Psychiatric Specialty Exam: Physical Exam  Nursing note and vitals reviewed. Constitutional: She is oriented to person, place, and time. She appears well-developed and well-nourished.  HENT:  Head: Normocephalic.  Respiratory: Effort normal.  Musculoskeletal:        General: Normal range of motion.     Cervical back: Normal range of motion.  Neurological: She is alert and oriented to person, place, and time.  Psychiatric: Her speech is normal and behavior is normal. Judgment normal. Her mood appears anxious. Cognition and memory are normal. She exhibits a depressed mood. She expresses suicidal ideation. She expresses suicidal plans.    Review of Systems  Psychiatric/Behavioral: Positive for dysphoric mood, self-injury and suicidal ideas.  All other systems reviewed and are negative.   Blood pressure 117/62, pulse 86, temperature 98.4 F (36.9 C), temperature source Oral, resp. rate 16, height 5\' 7"  (1.702 m), weight 62.5 kg, SpO2 94 %.Body mass  index is 21.58 kg/m.  General Appearance: Casual  Eye Contact:  Fair  Speech:  Slow  Volume:  Decreased  Mood:  Anxious and Depressed  Affect:  Congruent  Thought Process:  Coherent and Descriptions of Associations: Intact  Orientation:  Full (Time, Place, and Person)  Thought Content:  Rumination  Suicidal Thoughts:  Yes.  with intent/plan  Homicidal Thoughts:  No  Memory:  Immediate;   Fair Recent;   Fair Remote;   Fair  Judgement:  Poor  Insight:  Fair  Psychomotor Activity:  Decreased  Concentration:  Concentration: Fair and Attention Span: Fair  Recall:  [employer] of Knowledge:  Fair  Language:  Good  Akathisia:  No  Handed:  Right  AIMS (if indicated):     Assets:  Housing Leisure Time Resilience Social Support  ADL's:  Intact  Cognition:  WNL  Sleep:        Treatment Plan Summary: Major depressive disorder, recurrent, severe without psychosis: -Recommend not starting Depakote -Recommend starting Lexapro 5 mg daily or Zoloft 25 mg daily, client declined -Admit to geriatric psychiatric hospitalization after medical clearance  Disposition: Recommend psychiatric Inpatient admission when medically cleared.  Waylan Boga, NP 01/10/2020 9:29 AM

## 2020-01-10 NOTE — Social Work (Addendum)
CSW received consult for inpatient psych at discharge.  CSW faxed referrals to the following hospitals:  [facility] [phone]: Reviewing referral Tuesday  [facility] [phone]: Reviewing referrals, stated they will make contact if patient is able to be accepted  [facility] [phone]: Requested call back only once patient is medically cleared, to see if bed is available  [facility] [phone]: Have not had a chance to review, will need follow-up  [facility] [phone]: Received referral, requested follow-up  Will follow-up with remaining facilities if local facilities do not have bed availability when discharge ready: [facility] CSW will continue to assist with discharge planning needs.  Criss Alvine, [place] Social Worker

## 2020-01-11 LAB — CBC
HCT: 30.4 % — ABNORMAL LOW (ref 36.0–46.0)
Hemoglobin: 9.7 g/dL — ABNORMAL LOW (ref 12.0–15.0)
MCH: 33 pg (ref 26.0–34.0)
MCHC: 31.9 g/dL (ref 30.0–36.0)
MCV: 103.4 fL — ABNORMAL HIGH (ref 80.0–100.0)
Platelets: 134 10*3/uL — ABNORMAL LOW (ref 150–400)
RBC: 2.94 MIL/uL — ABNORMAL LOW (ref 3.87–5.11)
RDW: 12 % (ref 11.5–15.5)
WBC: 4.3 10*3/uL (ref 4.0–10.5)
nRBC: 0 % (ref 0.0–0.2)

## 2020-01-11 LAB — COMPREHENSIVE METABOLIC PANEL
ALT: 29 U/L (ref 0–44)
AST: 36 U/L (ref 15–41)
Albumin: 2.5 g/dL — ABNORMAL LOW (ref 3.5–5.0)
Alkaline Phosphatase: 40 U/L (ref 38–126)
Anion gap: 6 (ref 5–15)
BUN: 5 mg/dL — ABNORMAL LOW (ref 8–23)
CO2: 26 mmol/L (ref 22–32)
Calcium: 8.7 mg/dL — ABNORMAL LOW (ref 8.9–10.3)
Chloride: 110 mmol/L (ref 98–111)
Creatinine, Ser: 0.7 mg/dL (ref 0.44–1.00)
GFR calc Af Amer: >60 mL/min (ref >60)
GFR calc non Af Amer: >60 mL/min (ref >60)
Glucose, Bld: 104 mg/dL — ABNORMAL HIGH (ref 70–99)
Potassium: 2.8 mmol/L — ABNORMAL LOW (ref 3.5–5.1)
Sodium: 142 mmol/L (ref 135–145)
Total Bilirubin: 0.7 mg/dL (ref 0.3–1.2)
Total Protein: 5.6 g/dL — ABNORMAL LOW (ref 6.5–8.1)

## 2020-01-11 LAB — PROTIME-INR
INR: 1.2 (ref 0.8–1.2)
Prothrombin Time: 14.6 seconds (ref 11.4–15.2)

## 2020-01-11 LAB — SALICYLATE LEVEL: Salicylate Lvl: 7 mg/dL (ref 7.0–30.0)

## 2020-01-11 MED ORDER — POTASSIUM CHLORIDE 10 MEQ/100ML IV SOLN
10.0000 meq | INTRAVENOUS | Status: AC
  Administered 2020-01-11 (×3): 10 meq via INTRAVENOUS
  Filled 2020-01-11 (×3): qty 100

## 2020-01-11 MED ORDER — POTASSIUM CHLORIDE CRYS ER 20 MEQ PO TBCR
40.0000 meq | EXTENDED_RELEASE_TABLET | ORAL | Status: AC
  Administered 2020-01-11 (×2): 40 meq via ORAL
  Filled 2020-01-11 (×2): qty 2

## 2020-01-11 MED ORDER — ENOXAPARIN SODIUM 40 MG/0.4ML ~~LOC~~ SOLN
40.0000 mg | SUBCUTANEOUS | Status: DC
  Administered 2020-01-11 – 2020-01-12 (×2): 40 mg via SUBCUTANEOUS
  Filled 2020-01-11 (×2): qty 0.4

## 2020-01-11 MED ORDER — ATORVASTATIN CALCIUM 40 MG PO TABS
40.0000 mg | ORAL_TABLET | Freq: Every day | ORAL | Status: DC
  Administered 2020-01-11 – 2020-01-12 (×2): 40 mg via ORAL
  Filled 2020-01-11 (×2): qty 1

## 2020-01-11 NOTE — Evaluation (Addendum)
Occupational Therapy Evaluation Patient Details Name: Kelly Lowe MRN: [id number] DOB: [birth year] Today's Date: 01/11/2020    History of Present Illness Kelly Lowe is a 69 y.o. female with medical history significant of with CVA or left-sided deficits, hypertension, hyperlipidemia, depression who presents for evaluation of suicidal attempt with Tylenol and aspirin overdose.   Clinical Impression   This 69 y/o female presents with the above. PTA pt reports being mod independent with basic ADL and mobility tasks using SPC. Pt intermittently tearful during session, though despite being in decreased spirits pt pleasant and willing to work with therapy today. Pt requiring minA for LB and UB ADL, donning L sling and AFO EOB given increased time/effort (pt with baseline L sided deficits from previous CVA). Pt performing functional transfers using SPC with minA. Noted likely plans for transitioning to inpatient psych at time of discharge. Acute OT to follow while she remains admitted to progress her overall safety and independence with ADL/mobility.     Follow Up Recommendations  Other (comment);Supervision/Assistance - 24 hour(noted plans for inpatient psych)    Equipment Recommendations  Other (comment)(TBD)           Precautions / Restrictions Precautions Precautions: Fall Precaution Comments: L hemi from previous CVA, has L AFO Required Braces or Orthoses: Other Brace Other Brace: has L AFO in shoe, sling for L UE when up Restrictions Weight Bearing Restrictions: No      Mobility Bed Mobility Overal bed mobility: Needs Assistance Bed Mobility: Supine to Sit     Supine to sit: Min guard     General bed mobility comments: increased time and effort, HOB elevated  Transfers Overall transfer level: Needs assistance Equipment used: Straight cane Transfers: Sit to/from Stand Sit to Stand: Min assist         General transfer comment: minA to power up    Balance  Overall balance assessment: Needs assistance Sitting-balance support: Feet supported Sitting balance-Leahy Scale: Fair Sitting balance - Comments: overall with fair trunk control, able to self-correct with minor imbalances    Standing balance support: Single extremity supported Standing balance-Leahy Scale: Fair Standing balance comment: minA for steadying today                           ADL either performed or assessed with clinical judgement   ADL Overall ADL's : Needs assistance/impaired Eating/Feeding: Set up;Sitting   Grooming: Set up;Min guard;Sitting   Upper Body Bathing: Minimal assistance;Sitting   Lower Body Bathing: Minimal assistance;Sitting/lateral leans;Sit to/from stand   Upper Body Dressing : Minimal assistance;Sitting Upper Body Dressing Details (indicate cue type and reason): light assist for donning sling prior to mobility  Lower Body Dressing: Minimal assistance;Sitting/lateral leans;Sit to/from stand Lower Body Dressing Details (indicate cue type and reason): minA for standing balance, light assist to don R shoe and tie bil shoe laces  Toilet Transfer: Minimal assistance;Stand-pivot;BSC Toilet Transfer Details (indicate cue type and reason): using SPC, opted for stand pivot vs ambulation to bathroom as pt endorsing dizziness which increased with standing Toileting- Clothing Manipulation and Hygiene: Minimal assistance;Sitting/lateral lean;Sit to/from stand Toileting - Clothing Manipulation Details (indicate cue type and reason): assist for management of mesh underwear, NT/safety sitter present and reporting pt typically requiring increased time for voiding      Functional mobility during ADLs: Minimal assistance;Cane       Vision         Perception     Praxis  Pertinent Vitals/Pain Pain Assessment: No/denies pain     Hand Dominance Right   Extremity/Trunk Assessment Upper Extremity Assessment Upper Extremity Assessment: LUE  deficits/detail LUE Deficits / Details: L hemiplegia from previous CVA  LUE Coordination: decreased fine motor;decreased gross motor   Lower Extremity Assessment Lower Extremity Assessment: Defer to PT evaluation   Cervical / Trunk Assessment Cervical / Trunk Assessment: Normal   Communication Communication Communication: No difficulties   Cognition Arousal/Alertness: Awake/alert Behavior During Therapy: Flat affect Overall Cognitive Status: Within Functional Limits for tasks assessed                                 General Comments: pt pleasant but with depressed spirits   General Comments  safety sitter present during session, pt family member arriving end of session     Exercises     Shoulder Instructions      Home Living Family/patient expects to be discharged to:: Private residence Living Arrangements: Children(daughter and her family) Available Help at Discharge: Family;Available 24 hours/day Type of Home: House Home Access: Level entry     Home Layout: Two level Alternate Level Stairs-Number of Steps: flight Alternate Level Stairs-Rails: Right Bathroom Shower/Tub: Tub/shower unit   Bathroom Toilet: Handicapped height     Home Equipment: Cane - single point;Tub bench          Prior Functioning/Environment Level of Independence: Independent with assistive device(s)        Comments: pt uses cane for ambulation, daughter does the driving and grocery shopping, pt indep with dressing and bathing        OT Problem List: Decreased range of motion;Decreased strength;Decreased activity tolerance;Impaired balance (sitting and/or standing);Decreased cognition;Decreased coordination;Decreased knowledge of use of DME or AE;Decreased knowledge of precautions;Impaired UE functional use      OT Treatment/Interventions: Self-care/ADL training;Therapeutic exercise;Energy conservation;DME and/or AE instruction;Therapeutic activities;Cognitive  remediation/compensation;Balance training;Patient/family education    OT Goals(Current goals can be found in the care plan section) Acute Rehab OT Goals Patient Stated Goal: daughter OT Goal Formulation: With patient Time For Goal Achievement: 01/25/20 Potential to Achieve Goals: Good  OT Frequency: Min 2X/week   Barriers to D/C:            Co-evaluation              AM-PAC OT "6 Clicks" Daily Activity     Outcome Measure Help from another person eating meals?: A Little Help from another person taking care of personal grooming?: A Little Help from another person toileting, which includes using toliet, bedpan, or urinal?: A Little Help from another person bathing (including washing, rinsing, drying)?: A Little Help from another person to put on and taking off regular upper body clothing?: A Little Help from another person to put on and taking off regular lower body clothing?: A Little 6 Click Score: 18   End of Session Equipment Utilized During Treatment: Gait belt Nurse Communication: Mobility status  Activity Tolerance: Patient tolerated treatment well Patient left: Other (comment);with nursing/sitter in room;with family/visitor present(seated on [facility] with Air cabin crew)  OT Visit Diagnosis: Other abnormalities of gait and mobility (R26.89);Muscle weakness (generalized) (M62.81);Hemiplegia and hemiparesis Hemiplegia - Right/Left: Left Hemiplegia - dominant/non-dominant: Non-Dominant Hemiplegia - caused by: Cerebral infarction(chronic)                Time: 8416-6063 OT Time Calculation (min): 16 min Charges:  OT General Charges $OT Visit: 1 Visit OT Evaluation $OT Eval  Moderate Complexity: 1 Mod  Lou Cal, OT Acute Rehabilitation Services Pager [phone] Office [phone]  Raymondo Band 01/11/2020, 3:09 PM

## 2020-01-11 NOTE — Plan of Care (Signed)

## 2020-01-11 NOTE — Progress Notes (Signed)
Lauren or Randon Goldsmith would like to know a time to be present or to be called for updates and to help make decisions regarding their mothers care. Thanks.  Ander Purpura  [phone]   Randon Goldsmith  [phone]

## 2020-01-11 NOTE — Progress Notes (Signed)
PROGRESS NOTE    Kelly Lowe  DPO:[id number] DOB: Mar 19, 1951 DOA: 01/09/2020 PCP: Carol Ada, MD   Brief Narrative: 69 year old with past medical history significant for CVA with resultant left side deficit, hypertension, hyperlipidemia, depression who presents for evaluation after a suicidal attempt with Tylenol and aspirin overdose.  Patient has been seen by psychiatry as an outpatient and was a started on Depakote about 2 weeks prior to admission due to worsening of her depression symptoms.  Patient did not like to take Depakote.  The night prior to admission patient ingested unknown amount of Tylenol and aspirin approximately around 8 PM with intention to end her life.  She  develop abdominal pain, nausea and vomiting.  She mentioned to her daughter what  she did the night prior to admission and she was brought to the ED.  Evaluation in the ED Tylenol level 88, anion gap 16, bicarb 20.  Poison control was contacted and patient was a started on acetylcysteine  drip and bicarb drip.  Psychiatric was consulted and is recommending for patient when  medical stable to be transferred to inpatient psychiatric facility.  Assessment & Plan:   Active Problems:   Overdose on Tylenol, intentional self-harm, initial encounter ([place])   Overdose   Major depressive disorder, recurrent severe without psychotic features [facility])   1-Acetaminophen overdose: Poison control was notified and recommendation was to start acetylcysteine drip. Tylenol level; 88----64--- 24----less than 10 Function test remained stable normal. INR-1.2 Plan to discontinue acetylcysteine drip today. Repeat  liver function test Normal. Will resume statins.    2-Aspirin overdose: Receive bicarb drip -Salicylate level on admission 44---20 -No evidence of metabolic acidosis on labs today -Continue with fluids. -repeat salicylates level today. Might be able to resume home dose aspirin tomorrow for stroke prevention.    3-Hypokalemia: 40 mEq x 2 orally ordered.  IV kcl 3 runs.   4-Suicidal attempt: Major depressive disorder: Psychiatric consulted. Continue with sitter at bedside Depakote discontinue per patient request. Psychiatry recommending Lexapro or Zoloft. Patient was started on Lexapro.  Patient will need admission to geriatric psychiatric hospital  after medical clearance Discussed with psych, ok to continue with Cymbalta and gabapentin.   Hyperlipidemia: Resume statins. Monitor renal function.   History of CVA with left-sided residual weakness:  Continue to hold aspirin today.  Follow salicylate level. Might be bale to resume home dose aspirin tomorrow.  HTN;  Holding Norvasc. Resume if BP increases  Mild thrombocytopenia; monitor.   Estimated body mass index is 21.51 kg/m as calculated from the following:   Height as of this encounter: 5\' 7"  (1.702 m).   Weight as of this encounter: 62.3 kg.   DVT prophylaxis: Lovenox Code Status: Full code Family Communication: Care discussed with patient and Daughter over phone 7-08 Disposition Plan:  Status is: Inpatient  Remains inpatient appropriate because:Hemodynamically unstable, Unsafe d/c plan and Inpatient level of care appropriate due to severity of illness   Dispo: The patient is from: Home              Anticipated d/c is to: Inpatient psychiatric facility              Anticipated d/c date is: 2 days              Patient currently is medically stable to d/c.        Consultants:   Psychiatry  Procedures:   None  Antimicrobials:    Subjective: She has better spirit. No new complaints. Agree  to take lexapro.   Objective: Vitals:   01/10/20 2302 01/11/20 0356 01/11/20 0718 01/11/20 0822  BP: 115/73 119/71 118/77   Pulse: 84 81 75   Resp: 19 19 18    Temp: 98.7 F (37.1 C) 98.5 F (36.9 C) 98.2 F (36.8 C)   TempSrc: Oral Oral Oral   SpO2: 95% 92% 96%   Weight:    62.3 kg  Height:        Intake/Output  Summary (Last 24 hours) at 01/11/2020 0912 Last data filed at 01/11/2020 0300 Gross per 24 hour  Intake 1834.64 ml  Output 2250 ml  Net -415.36 ml   Filed Weights   01/09/20 1603 01/10/20 0001 01/11/20 0822  Weight: 62.1 kg 62.5 kg 62.3 kg    Examination:  General exam: NAD Respiratory system: CTA Cardiovascular system:S 1, S 2 RRR Gastrointestinal system: BS present, soft, nt Central nervous system:Alert, and oriented Extremities: Symmetric power  Data Reviewed: I have personally reviewed following labs and imaging studies  CBC: Recent Labs  Lab 01/09/20 1242 01/09/20 1531 01/11/20 0711  WBC 4.0  --  4.3  HGB 12.8 12.2 9.7*  HCT 39.4 36.0 30.4*  MCV 101.8*  --  103.4*  PLT 167  --  280*   Basic Metabolic Panel: Recent Labs  Lab 01/09/20 1242 01/09/20 1524 01/09/20 1531 01/10/20 0624 01/10/20 1118 01/11/20 0711  NA 139  --  141 140 141 142  K 3.7  --  3.9 2.6* 2.9* 2.8*  CL 103  --   --  103 105 110  CO2 20*  --   --  26 26 26   GLUCOSE 179*  --   --  122* 87 104*  BUN 12  --   --  9 6* 5*  CREATININE 0.82  --   --  0.75 0.72 0.70  CALCIUM 9.1  --   --  8.4* 8.7* 8.7*  MG  --  2.0  --   --   --   --    GFR: Estimated Creatinine Clearance: 65.5 mL/min (by C-G formula based on SCr of 0.7 mg/dL). Liver Function Tests: Recent Labs  Lab 01/09/20 1242 01/09/20 2220 01/10/20 0624 01/11/20 0711  AST 46* 35 31 36  ALT 36 31 27 29   ALKPHOS 57 45 38 40  BILITOT 0.4 0.5 0.5 0.7  PROT 8.2* 6.8 6.1* 5.6*  ALBUMIN 3.6 2.8* 2.6* 2.5*   No results for input(s): LIPASE, AMYLASE in the last 168 hours. No results for input(s): AMMONIA in the last 168 hours. Coagulation Profile: Recent Labs  Lab 01/09/20 1524 01/09/20 2220 01/10/20 0624 01/11/20 0711  INR 1.1 1.4* 1.4* 1.2   Cardiac Enzymes: No results for input(s): CKTOTAL, CKMB, CKMBINDEX, TROPONINI in the last 168 hours. BNP (last 3 results) No results for input(s): PROBNP in the last 8760  hours. HbA1C: No results for input(s): HGBA1C in the last 72 hours. CBG: No results for input(s): GLUCAP in the last 168 hours. Lipid Profile: No results for input(s): CHOL, HDL, LDLCALC, TRIG, CHOLHDL, LDLDIRECT in the last 72 hours. Thyroid Function Tests: Recent Labs    01/09/20 2220  TSH 0.559   Anemia Panel: No results for input(s): VITAMINB12, FOLATE, FERRITIN, TIBC, IRON, RETICCTPCT in the last 72 hours. Sepsis Labs: No results for input(s): PROCALCITON, LATICACIDVEN in the last 168 hours.  Recent Results (from the past 240 hour(s))  SARS Coronavirus 2 by RT PCR (hospital order, performed in [facility] hospital lab) Nasopharyngeal Nasopharyngeal Swab  Status: None   Collection Time: 01/09/20  2:41 PM   Specimen: Nasopharyngeal Swab  Result Value Ref Range Status   SARS Coronavirus 2 NEGATIVE NEGATIVE Final    Comment: (NOTE) SARS-CoV-2 target nucleic acids are NOT DETECTED. The SARS-CoV-2 RNA is generally detectable in upper and lower respiratory specimens during the acute phase of infection. The lowest concentration of SARS-CoV-2 viral copies this assay can detect is 250 copies / mL. A negative result does not preclude SARS-CoV-2 infection and should not be used as the sole basis for treatment or other patient management decisions.  A negative result may occur with improper specimen collection / handling, submission of specimen other than nasopharyngeal swab, presence of viral mutation(s) within the areas targeted by this assay, and inadequate number of viral copies (<250 copies / mL). A negative result must be combined with clinical observations, patient history, and epidemiological information. Fact Sheet for Patients:   StrictlyIdeas.no Fact Sheet for Healthcare Providers: BankingDealers.co.za This test is not yet approved or cleared  by the [place] FDA and has been authorized for detection and/or diagnosis  of SARS-CoV-2 by FDA under an Emergency Use Authorization (EUA).  This EUA will remain in effect (meaning this test can be used) for the duration of the COVID-19 declaration under Section 564(b)(1) of the Act, 21 U.S.C. section 360bbb-3(b)(1), unless the authorization is terminated or revoked sooner. Performed at [facility], [place] [address], [place], [place] [address]          Radiology Studies: No results found.      Scheduled Meds: . diclofenac sodium  2 g Topical QID  . DULoxetine  60 mg Oral Daily  . escitalopram  5 mg Oral Daily  . gabapentin  600 mg Oral QHS  . melatonin  3 mg Oral QHS  . ondansetron (ZOFRAN) IV  4 mg Intravenous Once  . potassium chloride  40 mEq Oral Q4H  . vitamin B-12  1,000 mcg Oral Daily   Continuous Infusions: . sodium chloride 100 mL/hr at 01/10/20 2255  . potassium chloride       LOS: 2 days    Time spent: 35 minutes.    Elmarie Shiley, MD [facility]   If 7PM-7AM, please contact night-coverage [email or web address]  01/11/2020, 9:12 AM

## 2020-01-12 DIAGNOSIS — E876 Hypokalemia: Secondary | ICD-10-CM

## 2020-01-12 DIAGNOSIS — I10 Essential (primary) hypertension: Secondary | ICD-10-CM

## 2020-01-12 DIAGNOSIS — D696 Thrombocytopenia, unspecified: Secondary | ICD-10-CM

## 2020-01-12 DIAGNOSIS — F332 Major depressive disorder, recurrent severe without psychotic features: Secondary | ICD-10-CM

## 2020-01-12 LAB — COMPREHENSIVE METABOLIC PANEL
ALT: 43 U/L (ref 0–44)
AST: 53 U/L — ABNORMAL HIGH (ref 15–41)
Albumin: 3 g/dL — ABNORMAL LOW (ref 3.5–5.0)
Alkaline Phosphatase: 44 U/L (ref 38–126)
Anion gap: 8 (ref 5–15)
BUN: <5 mg/dL — ABNORMAL LOW (ref 8–23)
CO2: 23 mmol/L (ref 22–32)
Calcium: 8.9 mg/dL (ref 8.9–10.3)
Chloride: 111 mmol/L (ref 98–111)
Creatinine, Ser: 0.72 mg/dL (ref 0.44–1.00)
GFR calc Af Amer: >60 mL/min (ref >60)
GFR calc non Af Amer: >60 mL/min (ref >60)
Glucose, Bld: 99 mg/dL (ref 70–99)
Potassium: 3.5 mmol/L (ref 3.5–5.1)
Sodium: 142 mmol/L (ref 135–145)
Total Bilirubin: 0.7 mg/dL (ref 0.3–1.2)
Total Protein: 6.3 g/dL — ABNORMAL LOW (ref 6.5–8.1)

## 2020-01-12 LAB — BASIC METABOLIC PANEL
Anion gap: 6 (ref 5–15)
BUN: <5 mg/dL — ABNORMAL LOW (ref 8–23)
CO2: 24 mmol/L (ref 22–32)
Calcium: 8.8 mg/dL — ABNORMAL LOW (ref 8.9–10.3)
Chloride: 113 mmol/L — ABNORMAL HIGH (ref 98–111)
Creatinine, Ser: 0.53 mg/dL (ref 0.44–1.00)
GFR calc Af Amer: >60 mL/min (ref >60)
GFR calc non Af Amer: >60 mL/min (ref >60)
Glucose, Bld: 80 mg/dL (ref 70–99)
Potassium: 3.6 mmol/L (ref 3.5–5.1)
Sodium: 143 mmol/L (ref 135–145)

## 2020-01-12 LAB — CBC
HCT: 28.6 % — ABNORMAL LOW (ref 36.0–46.0)
Hemoglobin: 9.4 g/dL — ABNORMAL LOW (ref 12.0–15.0)
MCH: 33.7 pg (ref 26.0–34.0)
MCHC: 32.9 g/dL (ref 30.0–36.0)
MCV: 102.5 fL — ABNORMAL HIGH (ref 80.0–100.0)
Platelets: 118 10*3/uL — ABNORMAL LOW (ref 150–400)
RBC: 2.79 MIL/uL — ABNORMAL LOW (ref 3.87–5.11)
RDW: 12.1 % (ref 11.5–15.5)
WBC: 4.4 10*3/uL (ref 4.0–10.5)
nRBC: 0 % (ref 0.0–0.2)

## 2020-01-12 LAB — SARS CORONAVIRUS 2 BY RT PCR (HOSPITAL ORDER, PERFORMED IN ~~LOC~~ HOSPITAL LAB): SARS Coronavirus 2: NEGATIVE

## 2020-01-12 NOTE — Progress Notes (Signed)
   01/12/20 1400  Clinical Encounter Type  Visited With Patient and family together;Health care provider  Visit Type Follow-up  Referral From Nurse  Consult/Referral To [place] (Advance Directive)   Chaplain responded to consult for AD. Chaplain confirmed that paperwork was completely filled out and ready for notary. Upon attempting to secure notary Chaplain was informed notary has left for the day. Chaplain returned to inform patient and family of the situation. Patient was noticeably irritated with the situation and scoweld at [facility]. Chaplain apologized for the inconvenience, and informed family that the AD can be notarized by their bank if all else fails. Family stated, "Yes, but I was told this was included in her hospital stay." Chaplain was excused. New notary will not be in until 8:30am Thursday morning. Chaplain will leave note on notary's desk for urgent completion of AD provided patient has not been transferred prior to Thursday morning. Chaplains reman available for support as needs arise.   Chaplain Resident, Evelene Croon, M Div [phone] on-call pager

## 2020-01-12 NOTE — TOC Initial Note (Addendum)
Transition of Care [facility]) - Initial/Assessment Note    Patient Details  Name: Kelly Lowe MRN: [id number] Date of Birth: [birth year]  [facility] of [facility] CM/SW Contact:    Jacquelynn Cree Phone Number: 01/12/2020, 2:38 PM  Clinical Narrative:                 CSW received a [facility] from [facility] in reference to referral sent on Monday. CSW spoke with Stanton Kidney [phone] who requested additional clinical information to be reviewed for placement. CSW faxed requested clinical information and is awaiting follow-up.   CSW spoke with patient and her daughter [name] bedside. CSW answered patient's daughter questions about psych facilities. CSW spoke with patient in reference to psychiatric recommendation. Patient expressed understanding and stated she would voluntarily transfer to inpatient psych placement at discharge. CSW agreed to keep daughter updated on progress towards placement. CSW will continue to follow and assist with discharge planning needs.  Expected Discharge Plan: Psychiatric Hospital Barriers to Discharge: Psych Bed not available   Patient Goals and CMS Choice     Choice offered to / list presented to : Patient  Expected Discharge Plan and Services Expected Discharge Plan: [place] arrangements for the past 2 months: [place]                                      Prior Living Arrangements/Services Living arrangements for the past 2 months: Single Family Home Lives with:: Self Patient language and need for interpreter reviewed:: Yes        Need for Family Participation in Patient Care: Yes (Comment) Care giver support system in place?: Yes (comment)   Criminal Activity/Legal Involvement Pertinent to Current Situation/Hospitalization: No - Comment as needed  Activities of Daily Living      Permission Sought/Granted Permission sought to share information with : Family Supports, Ship broker granted to share information with : Yes, Verbal Permission Granted  Share Information with NAME: [name] & Lauren     Permission granted to share info w Relationship: Daughters  Permission granted to share info w Contact Information: [phone]  Emotional Assessment Appearance:: Appears stated age Attitude/Demeanor/Rapport: Unable to Assess Affect (typically observed): Unable to Assess Orientation: : Oriented to Self, Oriented to Place, Oriented to  Time, Oriented to Situation Alcohol / Substance Use: Not Applicable Psych Involvement: Yes (comment)  Admission diagnosis:  Overdose [T50.901A] Hyperglycemia [R73.9] Intentional aspirin overdose, initial encounter ([place]) [T39.012A] Overdose on Tylenol, intentional self-harm, initial encounter [facility]) [T39.1X2A] Patient Active Problem List   Diagnosis Date Noted  . Major depressive disorder, recurrent severe without psychotic features ([place]) 01/10/2020  . Overdose on Tylenol, intentional self-harm, initial encounter ([place]) 01/09/2020  . Overdose 01/09/2020  . Intentional aspirin overdose ([place])   . Subluxation of left shoulder joint 01/04/2020  . Hemiparesis as late effect of cerebrovascular accident (CVA) ([place]) 10/14/2019  . 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 Spastic hemiplegia affecting nondominant side ([place]) 12/30/2018  . Adjustment disorder with depressed mood   . Spastic hemiparesis ([place])   . Constipation   . Chronic bilateral low back pain without sciatica   . Neurogenic bladder   . Fracture of metatarsal bone of left foot   . Hemiparesis of left nondominant side ([place]) 05/13/2017  . Cognitive deficits following cerebral infarction 05/13/2017  . Sleep disturbance   .  Closed fracture of bone of right foot   . Benign essential HTN   . Slow transit constipation   . Embolic stroke of right basal ganglia ([place]) 05/06/2017  . Tobacco use 05/05/2017  . Cerebral infarction ([place])   . Pain   .  Hypokalemia   . Acute blood loss anemia   . CVA (cerebral vascular accident) ([place]) 05/03/2017   PCP:  Carol Ada, MD Pharmacy:   [place] [place] [address] Phone: [phone] Fax: [phone]  [facility] [place], [address] [place] [address] Phone: [phone] Fax: [phone]     Social Determinants of Health (SDOH) Interventions    Readmission Risk Interventions No flowsheet data found.

## 2020-01-12 NOTE — Progress Notes (Signed)
PROGRESS NOTE    Kelly Lowe  ATF:[id number] DOB: 1951/05/24 DOA: 01/09/2020 PCP: Carol Ada, MD    Brief Narrative:  69 year old with past medical history significant for CVA with resultant left side deficit, hypertension, hyperlipidemia, depression who presents for evaluation after a suicidal attempt with Tylenol and aspirin overdose.  Patient has been seen by psychiatry as an outpatient and was a started on Depakote about 2 weeks prior to admission due to worsening of her depression symptoms.  Patient did not like to take Depakote.  The night prior to admission patient ingested unknown amount of Tylenol and aspirin approximately around 8 PM with intention to end her life.  She  develop abdominal pain, nausea and vomiting.  She mentioned to her daughter what  she did the night prior to admission and she was brought to the ED.  Evaluation in the ED Tylenol level 88, anion gap 16, bicarb 20.  Poison control was contacted and patient was a started on acetylcysteine  drip and bicarb drip.  Psychiatric was consulted and is recommending for patient when  medical stable to be transferred to inpatient psychiatric facility.    Consultants:   psychiatry  Procedures:   Antimicrobials:       Subjective: Has no complaints. Sitter at bedside. No sob, dizziness, cp.  Objective: Vitals:   01/12/20 0025 01/12/20 0455 01/12/20 0718 01/12/20 1110  BP:  137/72 (!) 144/84 135/84  Pulse:  66 65 69  Resp:  16 18 16   Temp:  97.9 F (36.6 C) 98.1 F (36.7 C) 98.6 F (37 C)  TempSrc:  Oral Oral Oral  SpO2:  97% 95% 95%  Weight: 64 kg     Height:        Intake/Output Summary (Last 24 hours) at 01/12/2020 1447 Last data filed at 01/12/2020 1300 Gross per 24 hour  Intake 1080 ml  Output 4800 ml  Net -3720 ml   Filed Weights   01/10/20 0001 01/11/20 0822 01/12/20 0025  Weight: 62.5 kg 62.3 kg 64 kg    Examination:  General exam: Appears calm and comfortable  Respiratory  system: Clear to auscultation. Respiratory effort normal. Cardiovascular system: S1 & S2 heard, RRR. No JVD, murmurs, rubs, gallops or clicks.  Gastrointestinal system: Abdomen is nondistended, soft and nontender.. Normal bowel sounds heard. Central nervous system: Alert and oriented. Grossly intact Extremities: no edema. Skin: warm, dry Psychiatry: Mood & affect appropriate in current setting.     Data Reviewed: I have personally reviewed following labs and imaging studies  CBC: Recent Labs  Lab 01/09/20 1242 01/09/20 1531 01/11/20 0711 01/12/20 0353  WBC 4.0  --  4.3 4.4  HGB 12.8 12.2 9.7* 9.4*  HCT 39.4 36.0 30.4* 28.6*  MCV 101.8*  --  103.4* 102.5*  PLT 167  --  134* 270*   Basic Metabolic Panel: Recent Labs  Lab 01/09/20 1242 01/09/20 1242 01/09/20 1524 01/09/20 1531 01/10/20 0624 01/10/20 1118 01/11/20 0711 01/12/20 0353  NA 139   < >  --  141 140 141 142 143  K 3.7   < >  --  3.9 2.6* 2.9* 2.8* 3.6  CL 103  --   --   --  103 105 110 113*  CO2 20*  --   --   --  26 26 26 24   GLUCOSE 179*  --   --   --  122* 87 104* 80  BUN 12  --   --   --  9  6* 5* <5*  CREATININE 0.82  --   --   --  0.75 0.72 0.70 0.53  CALCIUM 9.1  --   --   --  8.4* 8.7* 8.7* 8.8*  MG  --   --  2.0  --   --   --   --   --    < > = values in this interval not displayed.   GFR: Estimated Creatinine Clearance: 65.5 mL/min (by C-G formula based on SCr of 0.53 mg/dL). Liver Function Tests: Recent Labs  Lab 01/09/20 1242 01/09/20 2220 01/10/20 0624 01/11/20 0711  AST 46* 35 31 36  ALT 36 31 27 29   ALKPHOS 57 45 38 40  BILITOT 0.4 0.5 0.5 0.7  PROT 8.2* 6.8 6.1* 5.6*  ALBUMIN 3.6 2.8* 2.6* 2.5*   No results for input(s): LIPASE, AMYLASE in the last 168 hours. No results for input(s): AMMONIA in the last 168 hours. Coagulation Profile: Recent Labs  Lab 01/09/20 1524 01/09/20 2220 01/10/20 0624 01/11/20 0711  INR 1.1 1.4* 1.4* 1.2   Cardiac Enzymes: No results for input(s):  CKTOTAL, CKMB, CKMBINDEX, TROPONINI in the last 168 hours. BNP (last 3 results) No results for input(s): PROBNP in the last 8760 hours. HbA1C: No results for input(s): HGBA1C in the last 72 hours. CBG: No results for input(s): GLUCAP in the last 168 hours. Lipid Profile: No results for input(s): CHOL, HDL, LDLCALC, TRIG, CHOLHDL, LDLDIRECT in the last 72 hours. Thyroid Function Tests: Recent Labs    01/09/20 2220  TSH 0.559   Anemia Panel: No results for input(s): VITAMINB12, FOLATE, FERRITIN, TIBC, IRON, RETICCTPCT in the last 72 hours. Sepsis Labs: No results for input(s): PROCALCITON, LATICACIDVEN in the last 168 hours.  Recent Results (from the past 240 hour(s))  SARS Coronavirus 2 by RT PCR (hospital order, performed in [facility] hospital lab) Nasopharyngeal Nasopharyngeal Swab     Status: None   Collection Time: 01/09/20  2:41 PM   Specimen: Nasopharyngeal Swab  Result Value Ref Range Status   SARS Coronavirus 2 NEGATIVE NEGATIVE Final    Comment: (NOTE) SARS-CoV-2 target nucleic acids are NOT DETECTED. The SARS-CoV-2 RNA is generally detectable in upper and lower respiratory specimens during the acute phase of infection. The lowest concentration of SARS-CoV-2 viral copies this assay can detect is 250 copies / mL. A negative result does not preclude SARS-CoV-2 infection and should not be used as the sole basis for treatment or other patient management decisions.  A negative result may occur with improper specimen collection / handling, submission of specimen other than nasopharyngeal swab, presence of viral mutation(s) within the areas targeted by this assay, and inadequate number of viral copies (<250 copies / mL). A negative result must be combined with clinical observations, patient history, and epidemiological information. Fact Sheet for Patients:   StrictlyIdeas.no Fact Sheet for Healthcare  Providers: BankingDealers.co.za This test is not yet approved or cleared  by the [place] FDA and has been authorized for detection and/or diagnosis of SARS-CoV-2 by FDA under an Emergency Use Authorization (EUA).  This EUA will remain in effect (meaning this test can be used) for the duration of the COVID-19 declaration under Section 564(b)(1) of the Act, 21 U.S.C. section 360bbb-3(b)(1), unless the authorization is terminated or revoked sooner. Performed at [facility], [place] [address] [place], [place] [address]          Radiology Studies: No results found.      Scheduled Meds: . atorvastatin  40 mg Oral Daily  . diclofenac sodium  2 g Topical QID  . DULoxetine  60 mg Oral Daily  . enoxaparin (LOVENOX) injection  40 mg Subcutaneous Q24H  . escitalopram  5 mg Oral Daily  . gabapentin  600 mg Oral QHS  . melatonin  3 mg Oral QHS  . ondansetron (ZOFRAN) IV  4 mg Intravenous Once  . vitamin B-12  1,000 mcg Oral Daily   Continuous Infusions: . sodium chloride 75 mL/hr at 01/12/20 0140    Assessment & Plan:   Active Problems:   Overdose on Tylenol, intentional self-harm, initial encounter ([place])   Overdose   Major depressive disorder, recurrent severe without psychotic features [facility])   1-Acetaminophen overdose: Poison control was notified and recommendation was to start acetylcysteine drip. Tylenol level; 88----64--- 24----less than 10 Function test remained stable normal. INR-1.2 acetylcysteine drip dc'd Repeat  liver function test Normal. Will resume statins.    2-Aspirin overdose: Receive bicarb drip -Salicylate level on admission 44---20 -No evidence of metabolic acidosis on labs today -Continue with fluids. -repeat salicylates level  Today is 7.0.  Might be able to resume home dose aspirin tomorrow for stroke prevention.   3-Hypokalemia: Replaced and stable today at 3.6  4-Suicidal attempt: Major depressive  disorder: Psychiatric consulted. Continue with sitter at bedside Depakote discontinue per patient request. Psychiatry recommending Lexapro or Zoloft. Patient was started on Lexapro.  Patient will need admission to [facility] psychiatric hospital  after medical clearance  ok to continue with Cymbalta and gabapentin per psych  Hyperlipidemia: Resumed statins. Monitor liver fxn  History of CVA with left-sided residual weakness:  Continue to hold aspirin today.  Follow salicylate level. Might be bale to resume home dose aspirin tomorrow.   HTN;  Holding Norvasc. Resume if BP increases   Mild thrombocytopenia; plt down to 118 today. Will hold lovenox, add scd instead. Monitor level.  Estimated body mass index is 21.51 kg/m as calculated from the following:   Height as of this encounter: 5\' 7"  (1.702 m).   Weight as of this encounter: 62.3 kg.   DVT prophylaxis: scd Code Status: Full code Family Communication:  Disposition Plan:  Status is: Inpatient  Remains inpatient appropriate because:Hemodynamically unstable, Unsafe d/c plan and Inpatient level of care appropriate due to severity of illness   Dispo: The patient is from: Home  Anticipated d/c is to: Inpatient psychiatric facility  Anticipated d/c date is: 1-2 days when accepted  Patient currently is medically stable to d/c.      LOS: 3 days   Time spent: 45 minutes with more than 50% COC    Nolberto Hanlon, MD [facility] Pager [phone]  If 7PM-7AM, please contact night-coverage [email or web address] Password [email or web address] 01/12/2020, 2:47 PM

## 2020-01-12 NOTE — Care Management Important Message (Signed)
Important Message  Patient Details  Name: Kelly Lowe MRN: [id number] Date of Birth: 15-May-1951   Medicare Important Message Given:  Yes     Shelda Altes 01/12/2020, 11:28 AM

## 2020-01-12 NOTE — Social Work (Addendum)
Update: Psych placement able to extend bed offer pending a chest x-ray and covid results. CSW updated MD on request, covid test is pending.   Update: Clinicals received and being reviewed by Channing Mutters MD. CSW contacted patient's daughter [name] and provided her with an update. CSW will continue to follow.  CSW contacted [employer] to follow-up on additional clinicals that were faxed. Awaiting a call back.  Criss Alvine, [place] Social Worker

## 2020-01-12 NOTE — Progress Notes (Signed)
Physical Therapy Treatment Patient Details Name: Kelly Lowe MRN: [id number] DOB: [birth year] Today's Date: 01/12/2020    History of Present Illness Kelly Lowe is a 69 y.o. female with medical history significant of with CVA or left-sided deficits, hypertension, hyperlipidemia, depression who was admitted on 01/09/20 for suicidal attempt with Tylenol and aspirin overdose.    PT Comments    Pt able to complete bed mobility min guard for safety and line management. Pt able to sit EOB and don shoes and LUE sling with supervision. Pt amb in hallway increased distance today with cane and min(A) for balance. Pt required verbal cues for safety and tactile cues for steadying during amb. Will continue to follow acutely until d/c to next venue of care.    Follow Up Recommendations  No PT follow up;Supervision/Assistance - 24 hour(behavioral health hospital)     Equipment Recommendations  None recommended by PT    Recommendations for Other Services       Precautions / Restrictions Precautions Precautions: Fall Precaution Comments: L hemi from previous CVA, has L AFO Required Braces or Orthoses: Other Brace Other Brace: has L AFO in shoe, sling for L UE when up    Mobility  Bed Mobility Overal bed mobility: Needs Assistance Bed Mobility: Supine to Sit     Supine to sit: Min guard     General bed mobility comments: increased time and effort, HOB elevated  Transfers Overall transfer level: Needs assistance Equipment used: Straight cane Transfers: Sit to/from Stand Sit to Stand: Min assist         General transfer comment: minA to power up  Ambulation/Gait Ambulation/Gait assistance: Min Web designer (Feet): 116 Feet Assistive device: Straight cane Gait Pattern/deviations: Step-through pattern;Decreased stride length;Drifts right/left Gait velocity: slow   General Gait Details: uses L AFO and R straight cane. Pt demonstrated increased right lean with amb  and limp with LLE, pt had several LOB at end of amb bout, pt denied fatigue.   Stairs             Wheelchair Mobility    Modified Rankin (Stroke Patients Only)       Balance Overall balance assessment: Needs assistance Sitting-balance support: Feet supported   Sitting balance - Comments: Pt able to sit EOB and put on AFOs and sling with supervision without imbalance   Standing balance support: Single extremity supported Standing balance-Leahy Scale: Fair Standing balance comment: minA for steadying, required min(A) for steadying with demonstration of her balance exercises in the room(tandem stance, feet together)                            Cognition Arousal/Alertness: Awake/alert Behavior During Therapy: Flat affect Overall Cognitive Status: No family/caregiver present to determine baseline cognitive functioning Area of Impairment: Safety/judgement;Awareness;Problem solving;Attention;Memory;Following commands                         Safety/Judgement: Decreased awareness of deficits;Decreased awareness of safety   Problem Solving: Requires verbal cues;Requires tactile cues General Comments: Pt reported that she has been crying more since the stroke and cried several times throughout the session. Pt reported her face felt funny and she has been feeling dizzy since her stroke. Pt quick to change mind during session pt agreed to walk in hall, then quickly changed her mind, then agreed. Pt did not appear to be aware of when she was feeling fatigued but demonstrated physical  signs of fatigue such as LOB.      Exercises      General Comments General comments (skin integrity, edema, etc.): safety sitter present during session and assisted during session. Pt requested to leave shoes on during dinner as she wanted to get up after with sitter and use the bathroom. No notable skin changes seen during session.      Pertinent Vitals/Pain Pain Assessment:  No/denies pain    Home Living                      Prior Function            PT Goals (current goals can now be found in the care plan section) Acute Rehab PT Goals Time For Goal Achievement: 01/24/20 Potential to Achieve Goals: Good Progress towards PT goals: Progressing toward goals    Frequency    Min 3X/week      PT Plan Current plan remains appropriate;Frequency needs to be updated    Co-evaluation              AM-PAC PT "6 Clicks" Mobility   Outcome Measure  Help needed turning from your back to your side while in a flat bed without using bedrails?: A Little Help needed moving from lying on your back to sitting on the side of a flat bed without using bedrails?: A Little Help needed moving to and from a bed to a chair (including a wheelchair)?: A Little Help needed standing up from a chair using your arms (e.g., wheelchair or bedside chair)?: A Little Help needed to walk in hospital room?: A Little Help needed climbing 3-5 steps with a railing? : A Little 6 Click Score: 18    End of Session Equipment Utilized During Treatment: Gait belt Activity Tolerance: Patient tolerated treatment well Patient left: in chair;with nursing/sitter in room;with call bell/phone within reach Nurse Communication: Mobility status PT Visit Diagnosis: Unsteadiness on feet (R26.81);Muscle weakness (generalized) (M62.81);Difficulty in walking, not elsewhere classified (R26.2)     Time: 6415-8309 PT Time Calculation (min) (ACUTE ONLY): 37 min  Charges:  $Gait Training: 8-22 mins $Therapeutic Activity: 8-22 mins                     [employer] SPT 01/12/2020    Rolland Porter 01/12/2020, 6:08 PM

## 2020-01-13 ENCOUNTER — Inpatient Hospital Stay (HOSPITAL_COMMUNITY): Payer: Medicare Other

## 2020-01-13 DIAGNOSIS — Z8673 Personal history of transient ischemic attack (TIA), and cerebral infarction without residual deficits: Secondary | ICD-10-CM

## 2020-01-13 DIAGNOSIS — T50902D Poisoning by unspecified drugs, medicaments and biological substances, intentional self-harm, subsequent encounter: Secondary | ICD-10-CM

## 2020-01-13 LAB — CBC
HCT: 29.6 % — ABNORMAL LOW (ref 36.0–46.0)
Hemoglobin: 9.6 g/dL — ABNORMAL LOW (ref 12.0–15.0)
MCH: 33.3 pg (ref 26.0–34.0)
MCHC: 32.4 g/dL (ref 30.0–36.0)
MCV: 102.8 fL — ABNORMAL HIGH (ref 80.0–100.0)
Platelets: 142 10*3/uL — ABNORMAL LOW (ref 150–400)
RBC: 2.88 MIL/uL — ABNORMAL LOW (ref 3.87–5.11)
RDW: 11.9 % (ref 11.5–15.5)
WBC: 3.9 10*3/uL — ABNORMAL LOW (ref 4.0–10.5)
nRBC: 0 % (ref 0.0–0.2)

## 2020-01-13 MED ORDER — ESCITALOPRAM OXALATE 5 MG PO TABS: 5.0000 mg | ORAL_TABLET | Freq: Every day | ORAL | Status: DC

## 2020-01-13 NOTE — Discharge Summary (Signed)
Kelly Lowe MBW:[id number] DOB: June 24, 1951 DOA: 01/09/2020  PCP: Carol Ada, MD  Admit date: 01/09/2020 Discharge date: 01/13/2020  Admitted From: Home Disposition: [facility]  Recommendations for Outpatient Follow-up:  1. Follow up with PCP in 1 week 2. Please obtain BMP/CBC in one week      Discharge Condition:Stable CODE STATUS: Full Diet recommendation: Heart Healthy  Brief/Interim Summary: Kelly Lowe is a 69 y.o. female with medical history significant of with CVAor left-sided deficits, hypertension, hyperlipidemia, depression who presents for evaluation of suicidal attempt with Tylenol and aspirin overdose. Patient has been seen by psychiatry as outpatient and was started on Depakote about 2 weeks ago for worsening of depression symptoms especially frequent tearing.  Patient told her daughter that she did not like Depakote level.  Patient ingested unknown amount of Tylenol and aspirin  with intentions to end her life. She felt nauseous  with 2-3 times vomiting of stomach content but denies any abdominal pain or bleeding.   Acetaminophen level 88, anion gap 16, bicarb 20.  Concern control contacted and acetylcysteine drip started and bicarb drip started.   Psychiatry was consulted.   1-Acetaminophen overdose: Poison control was notified and recommendation was to start acetylcysteine drip. Tylenol level; 88----64--- 24----less than 10 Function test remained stable normal. INR-1.2 acetylcysteine drip dc'd Repeat liver function test Normal.    2-Aspirin overdose: Receive bicarb drip -Salicylate level on admission 44---20 -No evidence of metabolic acidosis on labs today -repeat salicylates level  is 7.0.  Can  resume home dose aspirin  for stroke prevention.  3-Hypokalemia: Replaced and stable   4-Suicidal attempt: Major depressive disorder: Psychiatric consulted. Continue with sitter at bedside Depakote discontinue per patient  request. Psychiatry recommending [facility]. Patient was started on Lexapro.  ok to continue with Cymbalta and gabapentin per psych covid test on 6/9 negative cxr requested by behavioral health is negative.  Hyperlipidemia:Resumed statins. Monitor liver fxn  History of CVA with left-sided residual weakness:  Continue to hold aspirin today.Follow salicylate level. Will resume home dose aspirin   HTN;  Holding Norvasc. Resume if BP increases   Mild thrombocytopenia; lovenox held. Improving now.     Discharge Diagnoses:  Active Problems:   Overdose on Tylenol, intentional self-harm, initial encounter ([place])   Overdose   Major depressive disorder, recurrent severe without psychotic features [facility])    Discharge Instructions  Discharge Instructions    Call MD for:  temperature >100.4   Complete by: As directed    Diet - low sodium heart healthy   Complete by: As directed    Increase activity slowly   Complete by: As directed      Allergies as of 01/13/2020   No Known Allergies     Medication List    STOP taking these medications   acetaminophen 325 MG tablet Commonly known as: TYLENOL   amLODipine 5 MG tablet Commonly known as: NORVASC   divalproex 250 MG 24 hr tablet Commonly known as: DEPAKOTE ER     TAKE these medications   aspirin 325 MG tablet Take 1 tablet (325 mg total) by mouth daily.   atorvastatin 40 MG tablet Commonly known as: LIPITOR Take 1 tablet (40 mg total) by mouth daily at 6 PM.   Biotin 1000 MCG tablet Take 1,000 mcg by mouth daily.   diclofenac sodium 1 % Gel Commonly known as: VOLTAREN Apply 2 g topically 4 (four) times daily.   DULoxetine 60 MG capsule Commonly known as: CYMBALTA TAKE 1 CAPSULE(60  MG) BY MOUTH DAILY What changed: See the new instructions.   escitalopram 5 MG tablet Commonly known as: LEXAPRO Take 1 tablet (5 mg total) by mouth daily. Start taking on: January 14, 2020   gabapentin 600 MG  tablet Commonly known as: Neurontin Take 1 tablet (600 mg total) by mouth at bedtime.   melatonin 3 MG Tabs tablet Take 3 mg by mouth at bedtime.   polyethylene glycol 17 g packet Commonly known as: MIRALAX / GLYCOLAX Take 17 g by mouth daily as needed (for constipation.).   vitamin B-12 1000 MCG tablet Commonly known as: CYANOCOBALAMIN Take 1,000 mcg by mouth daily.       No Known Allergies  Consultations:  Psychiatry   Procedures/Studies: DG Chest 1 View  Result Date: 01/13/2020 CLINICAL DATA:  Leukocytosis EXAM: CHEST  1 VIEW COMPARISON:  None. FINDINGS: Loop recorder device projects over the left chest. Heart and mediastinal contours are within normal limits. No focal opacities or effusions. No acute bony abnormality. IMPRESSION: No active disease. Electronically Signed   By: Rolm Baptise M.D.   On: 01/13/2020 11:59       Subjective: Patient has seen and examined.  Has no shortness of breath, dizziness, chest pain or any complaints.  Discharge Exam: Vitals:   01/13/20 0815 01/13/20 1159  BP: 123/72 132/79  Pulse: 77 73  Resp: 18 20  Temp: 98.7 F (37.1 C) 98.7 F (37.1 C)  SpO2: 99% 95%   Vitals:   01/13/20 0355 01/13/20 0410 01/13/20 0815 01/13/20 1159  BP: 133/78  123/72 132/79  Pulse: 68  77 73  Resp: 16  18 20   Temp: 98 F (36.7 C)  98.7 F (37.1 C) 98.7 F (37.1 C)  TempSrc: Oral  Oral Oral  SpO2: 93%  99% 95%  Weight:  62.9 kg    Height:        General: Pt is alert, awake, not in acute distress Cardiovascular: RRR, S1/S2 +, no rubs, no gallops Respiratory: CTA bilaterally, no wheezing, no rhonchi Abdominal: Soft, NT, ND, bowel sounds + Extremities: no edema, no cyanosis    The results of significant diagnostics from this hospitalization (including imaging, microbiology, ancillary and laboratory) are listed below for reference.     Microbiology: Recent Results (from the past 240 hour(s))  SARS Coronavirus 2 by RT PCR (hospital  order, performed in [facility] hospital lab) Nasopharyngeal Nasopharyngeal Swab     Status: None   Collection Time: 01/09/20  2:41 PM   Specimen: Nasopharyngeal Swab  Result Value Ref Range Status   SARS Coronavirus 2 NEGATIVE NEGATIVE Final    Comment: (NOTE) SARS-CoV-2 target nucleic acids are NOT DETECTED. The SARS-CoV-2 RNA is generally detectable in upper and lower respiratory specimens during the acute phase of infection. The lowest concentration of SARS-CoV-2 viral copies this assay can detect is 250 copies / mL. A negative result does not preclude SARS-CoV-2 infection and should not be used as the sole basis for treatment or other patient management decisions.  A negative result may occur with improper specimen collection / handling, submission of specimen other than nasopharyngeal swab, presence of viral mutation(s) within the areas targeted by this assay, and inadequate number of viral copies (<250 copies / mL). A negative result must be combined with clinical observations, patient history, and epidemiological information. Fact Sheet for Patients:   StrictlyIdeas.no Fact Sheet for Healthcare Providers: BankingDealers.co.za This test is not yet approved or cleared  by the [place] FDA and has been  authorized for detection and/or diagnosis of SARS-CoV-2 by FDA under an Emergency Use Authorization (EUA).  This EUA will remain in effect (meaning this test can be used) for the duration of the COVID-19 declaration under Section 564(b)(1) of the Act, 21 U.S.C. section 360bbb-3(b)(1), unless the authorization is terminated or revoked sooner. Performed at [facility], [place] [address] [place], [place] [address]   SARS Coronavirus 2 by RT PCR (hospital order, performed in [facility] hospital lab) Nasopharyngeal Nasopharyngeal Swab     Status: None   Collection Time: 01/12/20  6:36 PM   Specimen: Nasopharyngeal Swab  Result  Value Ref Range Status   SARS Coronavirus 2 NEGATIVE NEGATIVE Final    Comment: (NOTE) SARS-CoV-2 target nucleic acids are NOT DETECTED. The SARS-CoV-2 RNA is generally detectable in upper and lower respiratory specimens during the acute phase of infection. The lowest concentration of SARS-CoV-2 viral copies this assay can detect is 250 copies / mL. A negative result does not preclude SARS-CoV-2 infection and should not be used as the sole basis for treatment or other patient management decisions.  A negative result may occur with improper specimen collection / handling, submission of specimen other than nasopharyngeal swab, presence of viral mutation(s) within the areas targeted by this assay, and inadequate number of viral copies (<250 copies / mL). A negative result must be combined with clinical observations, patient history, and epidemiological information. Fact Sheet for Patients:   StrictlyIdeas.no Fact Sheet for Healthcare Providers: BankingDealers.co.za This test is not yet approved or cleared  by the [place] FDA and has been authorized for detection and/or diagnosis of SARS-CoV-2 by FDA under an Emergency Use Authorization (EUA).  This EUA will remain in effect (meaning this test can be used) for the duration of the COVID-19 declaration under Section 564(b)(1) of the Act, 21 U.S.C. section 360bbb-3(b)(1), unless the authorization is terminated or revoked sooner. Performed at [facility], [place] [address] [place], [place] [address]      Labs: BNP (last 3 results) No results for input(s): BNP in the last 8760 hours. Basic Metabolic Panel: Recent Labs  Lab 01/09/20 1524 01/09/20 1531 01/10/20 0624 01/10/20 1118 01/11/20 0711 01/12/20 0353 01/12/20 1526  NA  --    < > 140 141 142 143 142  K  --    < > 2.6* 2.9* 2.8* 3.6 3.5  CL  --    < > 103 105 110 113* 111  CO2  --    < > 26 26 26 24 23   GLUCOSE  --     < > 122* 87 104* 80 99  BUN  --    < > 9 6* 5* <5* <5*  CREATININE  --    < > 0.75 0.72 0.70 0.53 0.72  CALCIUM  --    < > 8.4* 8.7* 8.7* 8.8* 8.9  MG 2.0  --   --   --   --   --   --    < > = values in this interval not displayed.   Liver Function Tests: Recent Labs  Lab 01/09/20 1242 01/09/20 2220 01/10/20 0624 01/11/20 0711 01/12/20 1526  AST 46* 35 31 36 53*  ALT 36 31 27 29  43  ALKPHOS 57 45 38 40 44  BILITOT 0.4 0.5 0.5 0.7 0.7  PROT 8.2* 6.8 6.1* 5.6* 6.3*  ALBUMIN 3.6 2.8* 2.6* 2.5* 3.0*   No results for input(s): LIPASE, AMYLASE in the last 168 hours. No  results for input(s): AMMONIA in the last 168 hours. CBC: Recent Labs  Lab 01/09/20 1242 01/09/20 1531 01/11/20 0711 01/12/20 0353 01/13/20 0508  WBC 4.0  --  4.3 4.4 3.9*  HGB 12.8 12.2 9.7* 9.4* 9.6*  HCT 39.4 36.0 30.4* 28.6* 29.6*  MCV 101.8*  --  103.4* 102.5* 102.8*  PLT 167  --  134* 118* 142*   Cardiac Enzymes: No results for input(s): CKTOTAL, CKMB, CKMBINDEX, TROPONINI in the last 168 hours. BNP: Invalid input(s): POCBNP CBG: No results for input(s): GLUCAP in the last 168 hours. D-Dimer No results for input(s): DDIMER in the last 72 hours. Hgb A1c No results for input(s): HGBA1C in the last 72 hours. Lipid Profile No results for input(s): CHOL, HDL, LDLCALC, TRIG, CHOLHDL, LDLDIRECT in the last 72 hours. Thyroid function studies No results for input(s): TSH, T4TOTAL, T3FREE, THYROIDAB in the last 72 hours.  Invalid input(s): FREET3 Anemia work up No results for input(s): VITAMINB12, FOLATE, FERRITIN, TIBC, IRON, RETICCTPCT in the last 72 hours. Urinalysis    Component Value Date/Time   COLORURINE YELLOW 01/10/2020 1018   APPEARANCEUR CLEAR 01/10/2020 1018   LABSPEC 1.020 01/10/2020 1018   PHURINE 5.0 01/10/2020 1018   GLUCOSEU NEGATIVE 01/10/2020 1018   HGBUR NEGATIVE 01/10/2020 1018   BILIRUBINUR NEGATIVE 01/10/2020 1018   KETONESUR 20 (A) 01/10/2020 1018   PROTEINUR NEGATIVE  01/10/2020 1018   NITRITE NEGATIVE 01/10/2020 1018   LEUKOCYTESUR NEGATIVE 01/10/2020 1018   Sepsis Labs Invalid input(s): PROCALCITONIN,  WBC,  LACTICIDVEN Microbiology Recent Results (from the past 240 hour(s))  SARS Coronavirus 2 by RT PCR (hospital order, performed in [facility] hospital lab) Nasopharyngeal Nasopharyngeal Swab     Status: None   Collection Time: 01/09/20  2:41 PM   Specimen: Nasopharyngeal Swab  Result Value Ref Range Status   SARS Coronavirus 2 NEGATIVE NEGATIVE Final    Comment: (NOTE) SARS-CoV-2 target nucleic acids are NOT DETECTED. The SARS-CoV-2 RNA is generally detectable in upper and lower respiratory specimens during the acute phase of infection. The lowest concentration of SARS-CoV-2 viral copies this assay can detect is 250 copies / mL. A negative result does not preclude SARS-CoV-2 infection and should not be used as the sole basis for treatment or other patient management decisions.  A negative result may occur with improper specimen collection / handling, submission of specimen other than nasopharyngeal swab, presence of viral mutation(s) within the areas targeted by this assay, and inadequate number of viral copies (<250 copies / mL). A negative result must be combined with clinical observations, patient history, and epidemiological information. Fact Sheet for Patients:   StrictlyIdeas.no Fact Sheet for Healthcare Providers: BankingDealers.co.za This test is not yet approved or cleared  by the [place] FDA and has been authorized for detection and/or diagnosis of SARS-CoV-2 by FDA under an Emergency Use Authorization (EUA).  This EUA will remain in effect (meaning this test can be used) for the duration of the COVID-19 declaration under Section 564(b)(1) of the Act, 21 U.S.C. section 360bbb-3(b)(1), unless the authorization is terminated or revoked sooner. Performed at [facility],  [place] [address], [place], [place] [address]   SARS Coronavirus 2 by RT PCR (hospital order, performed in [facility] hospital lab) Nasopharyngeal Nasopharyngeal Swab     Status: None   Collection Time: 01/12/20  6:36 PM   Specimen: Nasopharyngeal Swab  Result Value Ref Range Status   SARS Coronavirus 2 NEGATIVE NEGATIVE Final    Comment: (NOTE) SARS-CoV-2 target  nucleic acids are NOT DETECTED. The SARS-CoV-2 RNA is generally detectable in upper and lower respiratory specimens during the acute phase of infection. The lowest concentration of SARS-CoV-2 viral copies this assay can detect is 250 copies / mL. A negative result does not preclude SARS-CoV-2 infection and should not be used as the sole basis for treatment or other patient management decisions.  A negative result may occur with improper specimen collection / handling, submission of specimen other than nasopharyngeal swab, presence of viral mutation(s) within the areas targeted by this assay, and inadequate number of viral copies (<250 copies / mL). A negative result must be combined with clinical observations, patient history, and epidemiological information. Fact Sheet for Patients:   StrictlyIdeas.no Fact Sheet for Healthcare Providers: BankingDealers.co.za This test is not yet approved or cleared  by the [place] FDA and has been authorized for detection and/or diagnosis of SARS-CoV-2 by FDA under an Emergency Use Authorization (EUA).  This EUA will remain in effect (meaning this test can be used) for the duration of the COVID-19 declaration under Section 564(b)(1) of the Act, 21 U.S.C. section 360bbb-3(b)(1), unless the authorization is terminated or revoked sooner. Performed at [facility], [place] [address], [place], [place] [address]      Time coordinating discharge: Over 30 minutes  SIGNED:   Nolberto Hanlon, MD  [facility] 01/13/2020, 12:25 PM Pager    If 7PM-7AM, please contact night-coverage [email or web address] Password [email or web address]

## 2020-01-13 NOTE — Progress Notes (Signed)
Chaplain notarized AD for patient.  De Burrs Chaplain Resident

## 2020-01-13 NOTE — Social Work (Signed)
CSW received telephone call from [place] at [facility] stating patient is at facility and report had not been called.  CSW contacted RN and was informed report called had not been made. CSW requested report be called.  CSW followed-up with Caryl Pina to inform her report is being called by RN and apologized for delay.  Criss Alvine, [place] Social Worker

## 2020-01-13 NOTE — TOC Progression Note (Addendum)
Transition of Care [facility]) - Progression Note    Patient Details  Name: Kelly Lowe MRN: [id number] Date of Birth: 30-Jun-1951  Transition [facility] Care [facility]) CM/SW [place], [place] Phone Number: 01/13/2020, 8:20 AM  Clinical Narrative:    Update: CSW faxed chest x-ray to [facility] for review. Awaiting voluntary form to be received. Updated patient.  CSW contacted [facility] to follow-up on clinical request of a chest x-ray. CSW spoke with Caryl Pina [phone] informed the chest x-ray is baseline for their admissions, to ensure there are no health concerns. It was requested CSW fax negative covid results to facility. CSW will continue to follow.   Expected Discharge Plan: Psychiatric Hospital Barriers to Discharge: Psych Bed not available  Expected Discharge Plan and Services Expected Discharge Plan: [place] arrangements for the past 2 months: Single Family Home                                       Social Determinants of Health (SDOH) Interventions    Readmission Risk Interventions No flowsheet data found.

## 2020-01-13 NOTE — Progress Notes (Signed)
Pt. D/c to behavioral facility.  D/c summary given to pt, and pt's DTR.  They both verbalized understanding of d/c instructions.  No distress noted by pt. Pt. Does not report any suicidal ideation at this time.  Tele, and IV site d/c.  Report called to transferring facility.

## 2020-01-13 NOTE — TOC Transition Note (Addendum)
Transition of Care [facility]) - CM/SW Discharge Note   Patient Details  Name: Kelly Lowe MRN: [id number] Date of Birth: Oct 16, 1950  Transition of [facility]) CM/SW Contact:  Jacquelynn Cree Phone Number: 01/13/2020, 3:52 PM   Clinical Narrative:     Patient will DC to: [place] notified: [facility] by: [facility]   Per MD patient ready for DC to [facility]. Voluntary form and safe transport liability form completed. RN, patient, patient's family, and facility notified of DC. Discharge Summary sent to facility. RN to call report prior to discharge [phone]). DC packet on chart. [facility] transport requested for patient.   CSW will sign off for now as social work intervention is no longer needed. Please consult [place] again if new needs arise.  Final next level of care: Psychiatric Hospital Barriers to Discharge: Psych Bed not available   Patient Goals and CMS Choice     Choice offered to / list presented to : Patient  Discharge Placement                       Discharge Plan and Services                                     Social Determinants of Health (SDOH) Interventions     Readmission Risk Interventions No flowsheet data found.

## 2020-01-14 DIAGNOSIS — D61818 Other pancytopenia: Secondary | ICD-10-CM | POA: Diagnosis not present

## 2020-01-14 DIAGNOSIS — I10 Essential (primary) hypertension: Secondary | ICD-10-CM | POA: Diagnosis not present

## 2020-01-14 DIAGNOSIS — E538 Deficiency of other specified B group vitamins: Secondary | ICD-10-CM | POA: Diagnosis not present

## 2020-01-14 DIAGNOSIS — Z8673 Personal history of transient ischemic attack (TIA), and cerebral infarction without residual deficits: Secondary | ICD-10-CM | POA: Diagnosis not present

## 2020-01-14 DIAGNOSIS — E785 Hyperlipidemia, unspecified: Secondary | ICD-10-CM | POA: Diagnosis not present

## 2020-01-15 DIAGNOSIS — E785 Hyperlipidemia, unspecified: Secondary | ICD-10-CM | POA: Diagnosis not present

## 2020-01-15 DIAGNOSIS — I69354 Hemiplegia and hemiparesis following cerebral infarction affecting left non-dominant side: Secondary | ICD-10-CM | POA: Diagnosis not present

## 2020-01-15 DIAGNOSIS — I10 Essential (primary) hypertension: Secondary | ICD-10-CM | POA: Diagnosis not present

## 2020-01-15 DIAGNOSIS — Z79899 Other long term (current) drug therapy: Secondary | ICD-10-CM | POA: Diagnosis not present

## 2020-01-15 DIAGNOSIS — E538 Deficiency of other specified B group vitamins: Secondary | ICD-10-CM | POA: Diagnosis not present

## 2020-01-15 DIAGNOSIS — D61818 Other pancytopenia: Secondary | ICD-10-CM | POA: Diagnosis not present

## 2020-01-15 DIAGNOSIS — F339 Major depressive disorder, recurrent, unspecified: Secondary | ICD-10-CM | POA: Diagnosis not present

## 2020-01-15 DIAGNOSIS — Z7982 Long term (current) use of aspirin: Secondary | ICD-10-CM | POA: Diagnosis not present

## 2020-01-16 DIAGNOSIS — E785 Hyperlipidemia, unspecified: Secondary | ICD-10-CM | POA: Diagnosis not present

## 2020-01-16 DIAGNOSIS — E538 Deficiency of other specified B group vitamins: Secondary | ICD-10-CM | POA: Diagnosis not present

## 2020-01-16 DIAGNOSIS — D61818 Other pancytopenia: Secondary | ICD-10-CM | POA: Diagnosis not present

## 2020-01-16 DIAGNOSIS — F332 Major depressive disorder, recurrent severe without psychotic features: Secondary | ICD-10-CM | POA: Diagnosis not present

## 2020-01-16 DIAGNOSIS — I10 Essential (primary) hypertension: Secondary | ICD-10-CM | POA: Diagnosis not present

## 2020-01-16 DIAGNOSIS — Z8673 Personal history of transient ischemic attack (TIA), and cerebral infarction without residual deficits: Secondary | ICD-10-CM | POA: Diagnosis not present

## 2020-01-16 LAB — CUP PACEART REMOTE DEVICE CHECK
Date Time Interrogation Session: 20210613231645
Implantable Pulse Generator Implant Date: 20190306

## 2020-01-17 ENCOUNTER — Telehealth: Payer: Self-pay | Admitting: Internal Medicine

## 2020-01-17 ENCOUNTER — Ambulatory Visit (INDEPENDENT_AMBULATORY_CARE_PROVIDER_SITE_OTHER): Payer: Medicare Other | Admitting: *Deleted

## 2020-01-17 ENCOUNTER — Ambulatory Visit: Payer: Medicare Other

## 2020-01-17 DIAGNOSIS — F332 Major depressive disorder, recurrent severe without psychotic features: Secondary | ICD-10-CM | POA: Diagnosis not present

## 2020-01-17 DIAGNOSIS — I10 Essential (primary) hypertension: Secondary | ICD-10-CM | POA: Diagnosis not present

## 2020-01-17 DIAGNOSIS — Z8673 Personal history of transient ischemic attack (TIA), and cerebral infarction without residual deficits: Secondary | ICD-10-CM | POA: Diagnosis not present

## 2020-01-17 DIAGNOSIS — I6300 Cerebral infarction due to thrombosis of unspecified precerebral artery: Secondary | ICD-10-CM | POA: Diagnosis not present

## 2020-01-17 DIAGNOSIS — E538 Deficiency of other specified B group vitamins: Secondary | ICD-10-CM | POA: Diagnosis not present

## 2020-01-17 DIAGNOSIS — E785 Hyperlipidemia, unspecified: Secondary | ICD-10-CM | POA: Diagnosis not present

## 2020-01-17 DIAGNOSIS — D61818 Other pancytopenia: Secondary | ICD-10-CM | POA: Diagnosis not present

## 2020-01-17 NOTE — Telephone Encounter (Signed)
    Went to chart to check notes. Pt's daughter said pt is in [facility] and unable to to send transmission. Called device to let them know and lari said will callback when pt is back home

## 2020-01-18 ENCOUNTER — Telehealth: Payer: Self-pay

## 2020-01-18 DIAGNOSIS — Z8673 Personal history of transient ischemic attack (TIA), and cerebral infarction without residual deficits: Secondary | ICD-10-CM | POA: Diagnosis not present

## 2020-01-18 DIAGNOSIS — F332 Major depressive disorder, recurrent severe without psychotic features: Secondary | ICD-10-CM | POA: Diagnosis not present

## 2020-01-18 DIAGNOSIS — E785 Hyperlipidemia, unspecified: Secondary | ICD-10-CM | POA: Diagnosis not present

## 2020-01-18 DIAGNOSIS — D61818 Other pancytopenia: Secondary | ICD-10-CM | POA: Diagnosis not present

## 2020-01-18 DIAGNOSIS — E538 Deficiency of other specified B group vitamins: Secondary | ICD-10-CM | POA: Diagnosis not present

## 2020-01-18 DIAGNOSIS — I10 Essential (primary) hypertension: Secondary | ICD-10-CM | POA: Diagnosis not present

## 2020-01-18 LAB — CUP PACEART REMOTE DEVICE CHECK
Date Time Interrogation Session: 20210613231645
Date Time Interrogation Session: 20210613231645
Implantable Pulse Generator Implant Date: 20190306
Implantable Pulse Generator Implant Date: 20190306

## 2020-01-18 NOTE — Telephone Encounter (Signed)
Spoke to [facility]) states patient may have remote box for LINQ at facility, will be kept at nurses station and they will send a manual check once a week. Education completed with Stanton Kidney on how to use Scientist, water quality. Called patients daughter Ander Purpura (DPR), LMOVM to call back. Need to see If someone can take transmitter to facility for checks.

## 2020-01-19 DIAGNOSIS — D61818 Other pancytopenia: Secondary | ICD-10-CM | POA: Diagnosis not present

## 2020-01-19 DIAGNOSIS — I10 Essential (primary) hypertension: Secondary | ICD-10-CM | POA: Diagnosis not present

## 2020-01-19 DIAGNOSIS — E538 Deficiency of other specified B group vitamins: Secondary | ICD-10-CM | POA: Diagnosis not present

## 2020-01-19 DIAGNOSIS — Z8673 Personal history of transient ischemic attack (TIA), and cerebral infarction without residual deficits: Secondary | ICD-10-CM | POA: Diagnosis not present

## 2020-01-19 DIAGNOSIS — E785 Hyperlipidemia, unspecified: Secondary | ICD-10-CM | POA: Diagnosis not present

## 2020-01-19 DIAGNOSIS — F332 Major depressive disorder, recurrent severe without psychotic features: Secondary | ICD-10-CM | POA: Diagnosis not present

## 2020-01-19 NOTE — Telephone Encounter (Signed)
I spoke with the pt daughter Cecille Rubin and she states the pt has been released from the hospital. I told her the loop stores the information . Once the pt is near the monitor it will send a transmission automatically.

## 2020-01-19 NOTE — Progress Notes (Signed)
[facility] Summary Report / Loop Recorder 

## 2020-02-02 DIAGNOSIS — I10 Essential (primary) hypertension: Secondary | ICD-10-CM | POA: Diagnosis not present

## 2020-02-02 DIAGNOSIS — G4700 Insomnia, unspecified: Secondary | ICD-10-CM | POA: Diagnosis not present

## 2020-02-02 DIAGNOSIS — F3341 Major depressive disorder, recurrent, in partial remission: Secondary | ICD-10-CM | POA: Diagnosis not present

## 2020-02-02 DIAGNOSIS — F4321 Adjustment disorder with depressed mood: Secondary | ICD-10-CM | POA: Diagnosis not present

## 2020-02-02 DIAGNOSIS — I63531 Cerebral infarction due to unspecified occlusion or stenosis of right posterior cerebral artery: Secondary | ICD-10-CM | POA: Diagnosis not present

## 2020-02-02 DIAGNOSIS — E785 Hyperlipidemia, unspecified: Secondary | ICD-10-CM | POA: Diagnosis not present

## 2020-02-02 DIAGNOSIS — I639 Cerebral infarction, unspecified: Secondary | ICD-10-CM | POA: Diagnosis not present

## 2020-02-15 DIAGNOSIS — M21372 Foot drop, left foot: Secondary | ICD-10-CM | POA: Diagnosis not present

## 2020-02-15 DIAGNOSIS — R2689 Other abnormalities of gait and mobility: Secondary | ICD-10-CM | POA: Diagnosis not present

## 2020-02-15 DIAGNOSIS — B351 Tinea unguium: Secondary | ICD-10-CM | POA: Diagnosis not present

## 2020-02-15 DIAGNOSIS — G608 Other hereditary and idiopathic neuropathies: Secondary | ICD-10-CM | POA: Diagnosis not present

## 2020-02-15 DIAGNOSIS — L84 Corns and callosities: Secondary | ICD-10-CM | POA: Diagnosis not present

## 2020-02-15 DIAGNOSIS — D2372 Other benign neoplasm of skin of left lower limb, including hip: Secondary | ICD-10-CM | POA: Diagnosis not present

## 2020-02-15 DIAGNOSIS — M79672 Pain in left foot: Secondary | ICD-10-CM | POA: Diagnosis not present

## 2020-02-16 ENCOUNTER — Other Ambulatory Visit: Payer: Self-pay | Admitting: Physical Medicine & Rehabilitation

## 2020-02-16 DIAGNOSIS — F339 Major depressive disorder, recurrent, unspecified: Secondary | ICD-10-CM | POA: Diagnosis not present

## 2020-02-17 ENCOUNTER — Ambulatory Visit (INDEPENDENT_AMBULATORY_CARE_PROVIDER_SITE_OTHER): Payer: Medicare Other | Admitting: *Deleted

## 2020-02-17 DIAGNOSIS — I639 Cerebral infarction, unspecified: Secondary | ICD-10-CM | POA: Diagnosis not present

## 2020-02-17 LAB — CUP PACEART REMOTE DEVICE CHECK
Date Time Interrogation Session: 20210714232643
Implantable Pulse Generator Implant Date: 20190306

## 2020-02-21 NOTE — Progress Notes (Signed)
[facility] Summary Report / Loop Recorder 

## 2020-02-24 ENCOUNTER — Encounter: Payer: Self-pay | Admitting: Physical Medicine & Rehabilitation

## 2020-02-24 ENCOUNTER — Encounter: Payer: Medicare Other | Attending: Physical Medicine & Rehabilitation | Admitting: Physical Medicine & Rehabilitation

## 2020-02-24 ENCOUNTER — Other Ambulatory Visit: Payer: Self-pay

## 2020-02-24 VITALS — BP 151/87 | HR 73 | Temp 98.3°F | Ht 67.0 in | Wt 136.8 lb

## 2020-02-24 DIAGNOSIS — I69359 Hemiplegia and hemiparesis following cerebral infarction affecting unspecified side: Secondary | ICD-10-CM | POA: Insufficient documentation

## 2020-02-24 DIAGNOSIS — G8110 Spastic hemiplegia affecting unspecified side: Secondary | ICD-10-CM | POA: Diagnosis not present

## 2020-02-24 DIAGNOSIS — R209 Unspecified disturbances of skin sensation: Secondary | ICD-10-CM | POA: Insufficient documentation

## 2020-02-24 DIAGNOSIS — G479 Sleep disorder, unspecified: Secondary | ICD-10-CM | POA: Diagnosis not present

## 2020-02-24 DIAGNOSIS — R269 Unspecified abnormalities of gait and mobility: Secondary | ICD-10-CM | POA: Insufficient documentation

## 2020-02-24 DIAGNOSIS — S43002S Unspecified subluxation of left shoulder joint, sequela: Secondary | ICD-10-CM | POA: Insufficient documentation

## 2020-02-24 DIAGNOSIS — I69319 Unspecified symptoms and signs involving cognitive functions following cerebral infarction: Secondary | ICD-10-CM | POA: Diagnosis not present

## 2020-02-24 DIAGNOSIS — G8114 Spastic hemiplegia affecting left nondominant side: Secondary | ICD-10-CM | POA: Diagnosis not present

## 2020-02-24 DIAGNOSIS — I69398 Other sequelae of cerebral infarction: Secondary | ICD-10-CM | POA: Insufficient documentation

## 2020-02-24 NOTE — Progress Notes (Signed)
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 The patient elects to proceed with procedure. Risks include but are not limited to weakness, respiratory distress, dry mouth, ptosis, antibody formation, worsening of some areas of function. Benefits include decreased abnormal muscle tone, improved hygiene and positioning, decreased skin breakdown and, in some cases, decreased pain. Options include conservative management with oral antispasticity agents, phenol chemodenervation of nerve or at motor nerve branches. More invasive options include intrathecal balcofen adminstration for appropriate candidates. Surgical options may include tendon lengthening or transposition or, rarely, dorsal rhizotomy.   History/Physical Examination: 69 y.o. femalewith history of HTN, right foot fractures presents for follow for right basal ganglia infarct.   mAS: left  elbow flexors: 2/4  wrist flexors 2/4  finger flexors 1/4  Left ADF/PF 2/4  Previous Treatments: Therapy/Range of motion Indication for guidance: Target active muscules  Procedure: Botulinum toxin was mixed with preservative free saline with a dilution of 0.5cc to 100 units. Targeted limb and muscles were identified. The skin was prepped with alcohol swabs and placement of needle tip in targeted muscle was confirmed using appropriate guidance. Prior to injection, positioning of needle tip outside of blood vessel was determined by pulling back on syringe plunger.  MUSCLE UNITS Dysport:  Left Med Pec 100   Left biceps 300U  Left FCR 100U   Left FCU 100U  Left FDS 200U  Left FDP 200U   Left Med Gastroc 150  Left Lat  Gastroc 150  Total units used: 4268 Units  Complications: None  Plan: 6 weeks  Xzaiver Vayda Anil Daquisha [place] 11:19 AM

## 2020-03-15 DIAGNOSIS — F339 Major depressive disorder, recurrent, unspecified: Secondary | ICD-10-CM | POA: Diagnosis not present

## 2020-03-17 ENCOUNTER — Other Ambulatory Visit: Payer: Self-pay | Admitting: Physical Medicine & Rehabilitation

## 2020-03-17 DIAGNOSIS — Z1231 Encounter for screening mammogram for malignant neoplasm of breast: Secondary | ICD-10-CM | POA: Diagnosis not present

## 2020-03-17 DIAGNOSIS — M85851 Other specified disorders of bone density and structure, right thigh: Secondary | ICD-10-CM | POA: Diagnosis not present

## 2020-03-17 DIAGNOSIS — Z780 Asymptomatic menopausal state: Secondary | ICD-10-CM | POA: Diagnosis not present

## 2020-03-17 DIAGNOSIS — M810 Age-related osteoporosis without current pathological fracture: Secondary | ICD-10-CM | POA: Diagnosis not present

## 2020-04-06 ENCOUNTER — Other Ambulatory Visit: Payer: Self-pay | Admitting: Physical Medicine & Rehabilitation

## 2020-04-12 DIAGNOSIS — F339 Major depressive disorder, recurrent, unspecified: Secondary | ICD-10-CM | POA: Diagnosis not present

## 2020-04-13 ENCOUNTER — Encounter: Payer: Medicare Other | Attending: Physical Medicine & Rehabilitation | Admitting: Physical Medicine & Rehabilitation

## 2020-04-13 ENCOUNTER — Encounter: Payer: Self-pay | Admitting: Physical Medicine & Rehabilitation

## 2020-04-13 ENCOUNTER — Other Ambulatory Visit: Payer: Self-pay

## 2020-04-13 VITALS — BP 144/83 | HR 84 | Temp 98.6°F | Ht 67.0 in | Wt 136.0 lb

## 2020-04-13 DIAGNOSIS — I69359 Hemiplegia and hemiparesis following cerebral infarction affecting unspecified side: Secondary | ICD-10-CM | POA: Insufficient documentation

## 2020-04-13 DIAGNOSIS — G8110 Spastic hemiplegia affecting unspecified side: Secondary | ICD-10-CM | POA: Diagnosis not present

## 2020-04-13 DIAGNOSIS — R269 Unspecified abnormalities of gait and mobility: Secondary | ICD-10-CM | POA: Insufficient documentation

## 2020-04-13 DIAGNOSIS — I639 Cerebral infarction, unspecified: Secondary | ICD-10-CM

## 2020-04-13 DIAGNOSIS — R3915 Urgency of urination: Secondary | ICD-10-CM | POA: Diagnosis not present

## 2020-04-13 DIAGNOSIS — I69398 Other sequelae of cerebral infarction: Secondary | ICD-10-CM | POA: Insufficient documentation

## 2020-04-13 DIAGNOSIS — S43002S Unspecified subluxation of left shoulder joint, sequela: Secondary | ICD-10-CM | POA: Diagnosis not present

## 2020-04-13 DIAGNOSIS — M792 Neuralgia and neuritis, unspecified: Secondary | ICD-10-CM

## 2020-04-13 DIAGNOSIS — I69319 Unspecified symptoms and signs involving cognitive functions following cerebral infarction: Secondary | ICD-10-CM | POA: Insufficient documentation

## 2020-04-13 DIAGNOSIS — R209 Unspecified disturbances of skin sensation: Secondary | ICD-10-CM | POA: Diagnosis not present

## 2020-04-13 DIAGNOSIS — G479 Sleep disorder, unspecified: Secondary | ICD-10-CM | POA: Insufficient documentation

## 2020-04-13 NOTE — Progress Notes (Signed)
Subjective:    Patient ID: Kelly Lowe, female    DOB: 12/11/1950, 69 y.o.   MRN: [id number]  HPI  Female with history of HTN, right foot fractures presents for follow for right basal ganglia infarct.   Last clinic visit on 02/24/20.  Daughter supplements history. She had Dysport injection at that time.  Since that time, pt states she did not have benefit, but it appears she has not been taking Baclofen. Her mood has also not improved. She is following up with Endo. She has not been doing exercises at home. She is not wearing her orthosis. She did not notice benefit with Gabapentin. She slid out of bed, but did not fall. Sleep is fair. She is following up with Psychiatry and medications were ordered. She has not followed up with PCP regarding urinary urgency.  Pain Inventory Average Pain 0 Pain Right Now 0 My pain is no pain  In the last 24 hours, has pain interfered with the following? General activity 0 Relation with others 0 Enjoyment of life 0 What TIME of day is your pain at its worst? none Sleep (in general) Fair  Pain is worse with: unsure Pain improves with: no pain Relief from Meds: n/a  Mobility walk with assistance use a cane how many minutes can you walk? 5-10 ability to climb steps?  yes do you drive?  no  Function retired I need assistance with the following:  meal prep, household duties and shopping  Neuro/Psych bladder control problems weakness trouble walking spasms dizziness confusion depression anxiety  Prior Studies Any changes since last visit?  no  Physicians involved in your care Any changes since last visit?  no Primary care Candace Smith   Family History  Problem Relation Age of Onset  . Stroke Maternal Grandmother   . Stroke Paternal Grandfather    Social History   Socioeconomic History  . Marital status: Divorced    Spouse name: Not on file  . Number of children: Not on file  . Years of education: Not on file  .  Highest education level: Not on file  Occupational History  . Not on file  Tobacco Use  . Smoking status: Former Smoker    Packs/day: 0.50    Years: 25.00    Pack years: 12.50    Types: Cigarettes    Quit date: 05/02/2017    Years since quitting: 2.9  . Smokeless tobacco: Never Used  Vaping Use  . Vaping Use: Never used  Substance and Sexual Activity  . Alcohol use: No    Alcohol/week: 1.0 standard drink    Types: 1 Glasses of wine per week    Comment: social  . Drug use: No  . Sexual activity: Never  Other Topics Concern  . Not on file  Social History Narrative  . Not on file   Social Determinants of Health   Financial Resource Strain:   . Difficulty of Paying Living Expenses: Not on file  Food Insecurity:   . Worried About Charity fundraiser in the Last Year: Not on file  . Ran Out of Food in the Last Year: Not on file  Transportation Needs:   . Lack of Transportation (Medical): Not on file  . Lack of Transportation (Non-Medical): Not on file  Physical Activity:   . Days of Exercise per Week: Not on file  . Minutes of Exercise per Session: Not on file  Stress:   . Feeling of Stress : Not on file  Social Connections:   . Frequency of Communication with Friends and Family: Not on file  . Frequency of Social Gatherings with Friends and Family: Not on file  . Attends Religious Services: Not on file  . Active Member of Clubs or Organizations: Not on file  . Attends Archivist Meetings: Not on file  . Marital Status: Not on file   Past Surgical History:  Procedure Laterality Date  . FOOT SURGERY    . LOOP RECORDER INSERTION N/A 10/08/2017   Procedure: LOOP RECORDER INSERTION;  Surgeon: Deboraha Sprang, MD;  Location: [place] [facility];  Service: Cardiovascular;  Laterality: N/A;  . TEE WITHOUT CARDIOVERSION N/A 05/08/2017   Procedure: TRANSESOPHAGEAL ECHOCARDIOGRAM (TEE);  Surgeon: Lelon Perla, MD;  Location: [facility] ENDOSCOPY;  Service: Cardiovascular;   Laterality: N/A;   Past Medical History:  Diagnosis Date  . Hyperlipidemia   . Hypertension   . Stroke ([place])    BP (!) 144/83   Pulse 84   Temp 98.6 F (37 C)   Ht 5\' 7"  (1.702 m)   Wt 136 lb (61.7 kg) Comment: pt reported  SpO2 95%   BMI 21.30 kg/m   Opioid Risk Score:   Fall Risk Score:  `1  Depression screen PHQ 2/9  Depression screen [facility] 2/9 04/13/2020 01/04/2020 12/30/2018 04/09/2018 01/09/2018 11/28/2017 11/13/2017  Decreased Interest 3 3 0 0 0 1 0  Down, Depressed, Hopeless 3 3 0 0 0 1 2  PHQ - 2 Score 6 6 0 0 0 2 2  Altered sleeping - - - - - 1 1  Tired, decreased energy - - - - - 1 0  Change in appetite - - - - - 1 0  Feeling bad or failure about yourself  - - - - - 1 1  Trouble concentrating - - - - - 1 0  Moving slowly or fidgety/restless - - - - - 1 1  Suicidal thoughts - - - - - 0 0  PHQ-9 Score - - - - - 8 5  Difficult doing work/chores - - - - - Somewhat difficult Somewhat difficult    Review of Systems  Constitutional: Negative.   HENT: Negative.   Eyes: Negative.   Respiratory: Negative.   Cardiovascular: Negative.   Gastrointestinal: Positive for constipation.       Poor appetite  Endocrine: Negative.   Genitourinary: Positive for urgency.  Musculoskeletal: Positive for arthralgias.       Spasms  Skin: Positive for rash.  Allergic/Immunologic: Negative.   Neurological: Positive for weakness and numbness.       Tingling   Hematological: Negative.   Psychiatric/Behavioral: Positive for confusion and dysphoric mood. The patient is nervous/anxious.   All other systems reviewed and are negative.     Objective:   Physical Exam  Constitutional: NAD. HENT: Normocephalic and atraumatic.  Musculoskeletal: Left shoulder subluxation  Gait: Not assessed due to wheelchair Psych: Tearful Neurological: Alert Motor: LUE: 2+/5 shoulder abduction, elbow flex 3/5, elbow ext 2+/5, hand grip 4-/5 with apraxia LLE: 4-/5 HF, 4/5 KE, ADF/PF 2/5  mAS left shoulder  abductors: 1/4, elbow flexors: 1+/4, wrist flexors 1/4, finger flexors 1/4, thumb flexors 1+/4 Left ADF/PF 1+/4 Follows commands.  Insight and awareness into deficits.     Assessment & Plan:  Femalewith history of HTN, right foot fractures presents for follow for right basal ganglia infarct.   1. Left spastic hemiparesis, fatigue, and functional deficitssecondary to right basal ganglia infarct  Encouraged  HEP No benefit with amantadine  Encouraged WHO/PRAFO  Continue Dysport injections: educated again on purpose of injections again             Dysport on next visit:               Left Med Pec 100               Left biceps 300U              Left FCR 100U               Left FCU 100U              Left FDS 200U              Left FDP 200U               Left Med Gastroc 150              Left Lat Gastroc 150  No benefit with Gabapentin to 600qhs  Continue Baclofen to 10 qhs  Will discuss with social worker options for home care given patient's daughter moving and wanting to live independently  2. Gait abnormality Continue cane for safety  3. Sleep disturbance  Improving  4. Mood lability: Patient, now benefiting with Psychology  Defer Cymbalta management to Psychiatry Nudexta ordered by Neurology - but cannot afford  Continue following up with Psychiatry  Major limiting factor  Wound evaluate for cognitive deficits after treatment of depression +/- Meds  5. Left shoulder subluxation with pain Kinesio taping on hold due to reaction to tape SPRINT placed on 9/5, removed on 11/6 Tramadol per PCP (educated on signs/symptoms serotonin syndrome) Ortho consult ordered by Neurology NP - recommended therapies, which she completed  6. Bony prominence right wrist Xray with bony prominence Voltaren gel in effective Good benefits with injection  on 9/26  7. Right hand numbness Managed at present Positional             Improved  8. Urinary urgency  Unlikely stroke related  Follow up with PCP, appointment in October   >35 minutes spent with patient and daughter in counseling regarding interventions and prognosis

## 2020-04-17 DIAGNOSIS — Z8673 Personal history of transient ischemic attack (TIA), and cerebral infarction without residual deficits: Secondary | ICD-10-CM | POA: Diagnosis not present

## 2020-04-17 DIAGNOSIS — G8194 Hemiplegia, unspecified affecting left nondominant side: Secondary | ICD-10-CM | POA: Diagnosis not present

## 2020-04-17 DIAGNOSIS — Z87891 Personal history of nicotine dependence: Secondary | ICD-10-CM | POA: Diagnosis not present

## 2020-04-17 DIAGNOSIS — Z6821 Body mass index (BMI) 21.0-21.9, adult: Secondary | ICD-10-CM | POA: Diagnosis not present

## 2020-04-17 DIAGNOSIS — M810 Age-related osteoporosis without current pathological fracture: Secondary | ICD-10-CM | POA: Diagnosis not present

## 2020-04-17 DIAGNOSIS — Z8781 Personal history of (healed) traumatic fracture: Secondary | ICD-10-CM | POA: Diagnosis not present

## 2020-05-02 DIAGNOSIS — I63531 Cerebral infarction due to unspecified occlusion or stenosis of right posterior cerebral artery: Secondary | ICD-10-CM | POA: Diagnosis not present

## 2020-05-02 DIAGNOSIS — E785 Hyperlipidemia, unspecified: Secondary | ICD-10-CM | POA: Diagnosis not present

## 2020-05-02 DIAGNOSIS — I639 Cerebral infarction, unspecified: Secondary | ICD-10-CM | POA: Diagnosis not present

## 2020-05-02 DIAGNOSIS — F4321 Adjustment disorder with depressed mood: Secondary | ICD-10-CM | POA: Diagnosis not present

## 2020-05-02 DIAGNOSIS — M810 Age-related osteoporosis without current pathological fracture: Secondary | ICD-10-CM | POA: Diagnosis not present

## 2020-05-02 DIAGNOSIS — F3341 Major depressive disorder, recurrent, in partial remission: Secondary | ICD-10-CM | POA: Diagnosis not present

## 2020-05-02 DIAGNOSIS — I10 Essential (primary) hypertension: Secondary | ICD-10-CM | POA: Diagnosis not present

## 2020-05-02 DIAGNOSIS — G4700 Insomnia, unspecified: Secondary | ICD-10-CM | POA: Diagnosis not present

## 2020-05-11 DIAGNOSIS — F339 Major depressive disorder, recurrent, unspecified: Secondary | ICD-10-CM | POA: Diagnosis not present

## 2020-05-13 DIAGNOSIS — Z23 Encounter for immunization: Secondary | ICD-10-CM | POA: Diagnosis not present

## 2020-05-15 DIAGNOSIS — M79672 Pain in left foot: Secondary | ICD-10-CM | POA: Diagnosis not present

## 2020-05-15 DIAGNOSIS — G608 Other hereditary and idiopathic neuropathies: Secondary | ICD-10-CM | POA: Diagnosis not present

## 2020-05-15 DIAGNOSIS — B351 Tinea unguium: Secondary | ICD-10-CM | POA: Diagnosis not present

## 2020-05-15 DIAGNOSIS — D2372 Other benign neoplasm of skin of left lower limb, including hip: Secondary | ICD-10-CM | POA: Diagnosis not present

## 2020-05-15 DIAGNOSIS — L84 Corns and callosities: Secondary | ICD-10-CM | POA: Diagnosis not present

## 2020-05-15 DIAGNOSIS — M21372 Foot drop, left foot: Secondary | ICD-10-CM | POA: Diagnosis not present

## 2020-05-15 DIAGNOSIS — R2689 Other abnormalities of gait and mobility: Secondary | ICD-10-CM | POA: Diagnosis not present

## 2020-05-16 ENCOUNTER — Other Ambulatory Visit: Payer: Self-pay | Admitting: Physical Medicine & Rehabilitation

## 2020-06-01 ENCOUNTER — Encounter: Payer: Medicare Other | Admitting: Physical Medicine & Rehabilitation

## 2020-06-01 DIAGNOSIS — H40033 Anatomical narrow angle, bilateral: Secondary | ICD-10-CM | POA: Diagnosis not present

## 2020-06-01 DIAGNOSIS — H2513 Age-related nuclear cataract, bilateral: Secondary | ICD-10-CM | POA: Diagnosis not present

## 2020-06-13 DIAGNOSIS — F339 Major depressive disorder, recurrent, unspecified: Secondary | ICD-10-CM | POA: Diagnosis not present

## 2020-06-15 ENCOUNTER — Telehealth: Payer: Self-pay

## 2020-06-15 ENCOUNTER — Encounter: Payer: Self-pay | Admitting: *Deleted

## 2020-06-15 ENCOUNTER — Other Ambulatory Visit: Payer: Self-pay | Admitting: Physical Medicine & Rehabilitation

## 2020-06-15 NOTE — Telephone Encounter (Signed)
Notified Lari-daughter of patient and she will speak with patient about the Gabapentin. She will call and schedule an appt for the first of the year for patient.

## 2020-06-15 NOTE — Telephone Encounter (Signed)
Opened in error

## 2020-06-15 NOTE — Telephone Encounter (Signed)
She states the Gabapentin does not work for her.  Baclofen I will continue to prescribe, but would need to see her back in 1-2 months.  Thanks.

## 2020-06-15 NOTE — Telephone Encounter (Signed)
Refill request for Cymbalta from pharmacy. Dr. Serita Grit last note states defer Cymbalta management to Psychiatry. I spoke to Dr. Posey Pronto to confirm that refill request needs to go to Psychiatrist. He states that is correct. I called patient's daughter Kelly Lowe to discuss with her. Kelly Lowe wants to know if Dr. Posey Pronto will continue to refill Gabapentin and Baclofen?

## 2020-07-18 DIAGNOSIS — F339 Major depressive disorder, recurrent, unspecified: Secondary | ICD-10-CM | POA: Diagnosis not present

## 2020-07-20 ENCOUNTER — Encounter: Payer: Medicare Other | Attending: Physical Medicine & Rehabilitation | Admitting: Physical Medicine & Rehabilitation

## 2020-07-20 ENCOUNTER — Other Ambulatory Visit: Payer: Self-pay

## 2020-07-20 ENCOUNTER — Encounter: Payer: Self-pay | Admitting: Physical Medicine & Rehabilitation

## 2020-07-20 VITALS — BP 133/82 | HR 73 | Temp 97.5°F | Ht 67.0 in | Wt 141.0 lb

## 2020-07-20 DIAGNOSIS — M810 Age-related osteoporosis without current pathological fracture: Secondary | ICD-10-CM | POA: Diagnosis not present

## 2020-07-20 DIAGNOSIS — E785 Hyperlipidemia, unspecified: Secondary | ICD-10-CM | POA: Diagnosis not present

## 2020-07-20 DIAGNOSIS — I10 Essential (primary) hypertension: Secondary | ICD-10-CM | POA: Diagnosis not present

## 2020-07-20 DIAGNOSIS — G8110 Spastic hemiplegia affecting unspecified side: Secondary | ICD-10-CM | POA: Insufficient documentation

## 2020-07-20 DIAGNOSIS — I6523 Occlusion and stenosis of bilateral carotid arteries: Secondary | ICD-10-CM | POA: Diagnosis not present

## 2020-07-20 DIAGNOSIS — I69359 Hemiplegia and hemiparesis following cerebral infarction affecting unspecified side: Secondary | ICD-10-CM | POA: Diagnosis not present

## 2020-07-20 DIAGNOSIS — I69319 Unspecified symptoms and signs involving cognitive functions following cerebral infarction: Secondary | ICD-10-CM | POA: Insufficient documentation

## 2020-07-20 DIAGNOSIS — G8194 Hemiplegia, unspecified affecting left nondominant side: Secondary | ICD-10-CM | POA: Diagnosis not present

## 2020-07-20 DIAGNOSIS — R269 Unspecified abnormalities of gait and mobility: Secondary | ICD-10-CM | POA: Insufficient documentation

## 2020-07-20 DIAGNOSIS — I639 Cerebral infarction, unspecified: Secondary | ICD-10-CM | POA: Diagnosis not present

## 2020-07-20 NOTE — Progress Notes (Signed)
Subjective:    Patient ID: Kelly Lowe, female    DOB: Jun 16, 1951, 69 y.o.   MRN: [id number]  HPI  Female with history of HTN, right foot fractures presents for follow for right basal ganglia infarct.   Last clinic visit on 04/13/20.  Daughter supplements history. Since that time, pt states she is doing HEP. She states she would like to restart Dysport again.  She states she had increase in tone a couple of weeks ago.  She is taking Baclofen qhs. She had information regarding home care, but would have to transition all of her care to [facility].  She is on the wait list for another care program. She had a fall losing her balance on an incline, without notable trauma. Sleep is good. Urinary urgency is improving, forgot to mention to PCP.  She rescheduled last appointment due to focus on mental health.  She had some constipation, which has resolved. Mood has improved with changes in medications and follow up with Psychology.   Pain Inventory Average Pain 1 Pain Right Now 1 My pain is dull  In the last 24 hours, has pain interfered with the following? General activity 0 Relation with others 0 Enjoyment of life 0 What TIME of day is your pain at its worst? night Sleep (in general) Good  Pain is worse with: unsure Pain improves with: rest and heat/ice Relief from Meds: 1   Family History  Problem Relation Age of Onset  . Stroke Maternal Grandmother   . Stroke Paternal Grandfather    Social History   Socioeconomic History  . Marital status: Divorced    Spouse name: Not on file  . Number of children: Not on file  . Years of education: Not on file  . Highest education level: Not on file  Occupational History  . Not on file  Tobacco Use  . Smoking status: Former Smoker    Packs/day: 0.50    Years: 25.00    Pack years: 12.50    Types: Cigarettes    Quit date: 05/02/2017    Years since quitting: 3.2  . Smokeless tobacco: Never Used  Vaping Use  . Vaping Use: Never used   Substance and Sexual Activity  . Alcohol use: No    Alcohol/week: 1.0 standard drink    Types: 1 Glasses of wine per week    Comment: social  . Drug use: No  . Sexual activity: Never  Other Topics Concern  . Not on file  Social History Narrative  . Not on file   Social Determinants of Health   Financial Resource Strain: Not on file  Food Insecurity: Not on file  Transportation Needs: Not on file  Physical Activity: Not on file  Stress: Not on file  Social Connections: Not on file   Past Surgical History:  Procedure Laterality Date  . FOOT SURGERY    . LOOP RECORDER INSERTION N/A 10/08/2017   Procedure: LOOP RECORDER INSERTION;  Surgeon: Deboraha Sprang, MD;  Location: [place] [facility];  Service: Cardiovascular;  Laterality: N/A;  . TEE WITHOUT CARDIOVERSION N/A 05/08/2017   Procedure: TRANSESOPHAGEAL ECHOCARDIOGRAM (TEE);  Surgeon: Lelon Perla, MD;  Location: [facility] ENDOSCOPY;  Service: Cardiovascular;  Laterality: N/A;   Past Medical History:  Diagnosis Date  . Hyperlipidemia   . Hypertension   . Stroke ([place])    BP 133/82   Pulse 73   Temp (!) 97.5 F (36.4 C)   Ht 5\' 7"  (1.702 m)  Wt 141 lb (64 kg)   BMI 22.08 kg/m   Opioid Risk Score:   Fall Risk Score:  `1  Depression screen PHQ 2/9  Depression screen [facility] 2/9 04/13/2020 01/04/2020 12/30/2018 04/09/2018 01/09/2018 11/28/2017 11/13/2017  Decreased Interest 3 3 0 0 0 1 0  Down, Depressed, Hopeless 3 3 0 0 0 1 2  PHQ - 2 Score 6 6 0 0 0 2 2  Altered sleeping - - - - - 1 1  Tired, decreased energy - - - - - 1 0  Change in appetite - - - - - 1 0  Feeling bad or failure about yourself  - - - - - 1 1  Trouble concentrating - - - - - 1 0  Moving slowly or fidgety/restless - - - - - 1 1  Suicidal thoughts - - - - - 0 0  PHQ-9 Score - - - - - 8 5  Difficult doing work/chores - - - - - Somewhat difficult Somewhat difficult    Review of Systems  Constitutional: Negative.   HENT: Negative.   Eyes: Negative.    Respiratory: Negative.   Cardiovascular: Negative.   Gastrointestinal: Positive for constipation.       Poor appetite  Endocrine: Negative.   Genitourinary: Positive for urgency.  Musculoskeletal: Positive for arthralgias.       Spasms  Skin: Positive for rash.  Allergic/Immunologic: Negative.   Neurological: Positive for weakness and numbness.       Tingling   Hematological: Negative.   Psychiatric/Behavioral: Positive for confusion and dysphoric mood. The patient is nervous/anxious.   All other systems reviewed and are negative.     Objective:   Physical Exam  Constitutional: No distress . Vital signs reviewed. HENT: Normocephalic.  Atraumatic. Eyes: EOMI. No discharge. Cardiovascular: No JVD.   Respiratory: Normal effort.  No stridor.   GI: Non-distended.   Skin: Warm and dry.  Intact. Psych: Normal mood.  Normal behavior. Musc: No edema in extremities.  No tenderness in extremities. Left shoulder subluxation Gait: Hemiparetic Neurological: Alert Motor: LUE: 2+/5 shoulder abduction, elbow flex 3+/5, elbow ext 2-/5, hand grip 4-/5 with apraxia LLE: 4-/5 HF, 4/5 KE, ADF/PF 2/5  mAS left shoulder abductors: 2/4, elbow flexors: 1+/4, wrist flexors 2/4, finger flexors 1/4, thumb flexors 2/4 Left ADF/PF 1+/4 Follows commands.  Insight and awareness into deficits.     Assessment & Plan:  Femalewith history of HTN, right foot fractures presents for follow for right basal ganglia infarct.   1. Left spastic hemiparesis, fatigue, and functional deficitssecondary to right basal ganglia infarct  Continue HEP No benefit with amantadine  Encouraged WHO/PRAFO  Continue Dysport injections: educated again on purpose of injections again             Pt would like to restart Dysport units:               Left Med Pec 100               Left biceps 300              Left FCR 100U               Left FCU 100U              Left FDS 200U              Left FDP 200U  Left Med Gastroc 150              Left Lat Gastroc 150   No benefit with Gabapentin to 600qhs  Continue Baclofen to 10 qhs, encouraged trial during the day 5mg   On wait list for home assistance   2. Gait abnormality  Continue cane for safety  3. Sleep disturbance  Improved  4. Mood lability: Patient, now benefiting with Psychology  Defer Cymbalta management to Psychiatry Nudexta ordered by Neurology - but cannot afford  Continue following up with Psychiatry  Major limiting factor  5. Left shoulder subluxation with pain Kinesio taping on hold due to reaction to tape SPRINT placed on 9/5, removed on 11/6 Tramadol per PCP (educated on signs/symptoms serotonin syndrome) Ortho consult ordered by Neurology NP - recommended therapies, which she completed  6. Bony prominence right wrist Xray with bony prominence Voltaren gel in effective  Good benefits with injection on 9/26  7. Right hand numbness Managed at present Positional             Improved  8. Urinary urgency  Unlikely stroke related  Improving

## 2020-08-24 ENCOUNTER — Encounter: Payer: Medicare Other | Admitting: Physical Medicine & Rehabilitation

## 2020-08-29 DIAGNOSIS — F339 Major depressive disorder, recurrent, unspecified: Secondary | ICD-10-CM | POA: Diagnosis not present

## 2020-09-19 IMAGING — MR MR HEAD WO/W CM
12 series · 48 of 48 positions shown · IV contrast (10 ml Multihance)
Comparison: Brain MRI 05/03/2017

CLINICAL DATA: Burning mouth syndrome.

Creatinine was obtained on site at [HOSPITAL] at [HOSPITAL].
Results: Creatinine 0.7 mg/dL.
EXAM:
MRI HEAD WITHOUT AND WITH CONTRAST
TECHNIQUE: Multiplanar, multiecho pulse sequences of the brain and surrounding
structures were obtained without and with intravenous contrast.
CONTRAST:  10mL MULTIHANCE GADOBENATE DIMEGLUMINE 529 MG/ML IV SOLN

[Series 2: t1_se_sag · sagittal · 5.0mm · 0.45mm/px · 2 of 21 slices shown]
[im 1/21]
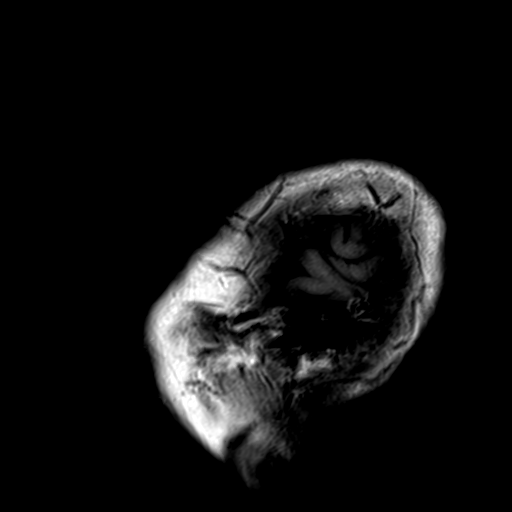
[im 21/21]
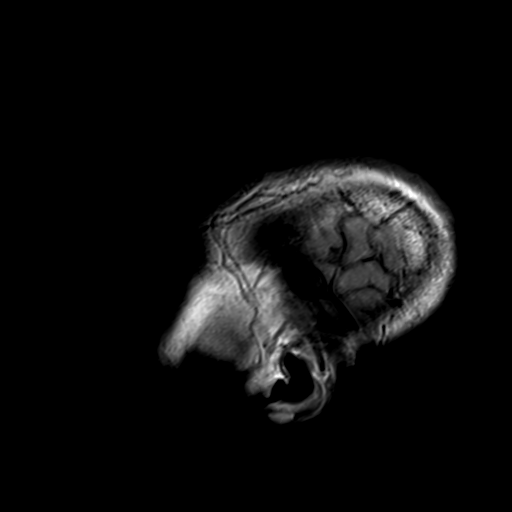

[Series 3: ep2d_diff_3 · axial · 3.0mm · 1.80mm/px · z∈[-53,+91]mm · 6 of 99 slices shown]
[im 1/99]
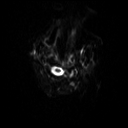
[im 20/99]
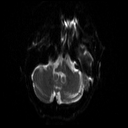
[im 40/99]
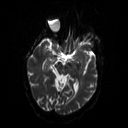
[im 59/99]
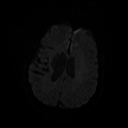
[im 79/99]
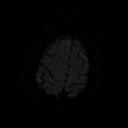
[im 99/99]
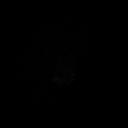

[Series 4: ep2d_diff_3_adc · axial · 3.0mm · 1.80mm/px · z∈[-53,+91]mm · 3 of 50 slices shown]
[im 1/50]
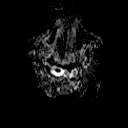
[im 25/50]
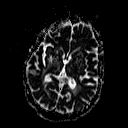
[im 50/50]
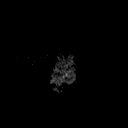

[Series 5: ep2d_diff_cor · coronal · 5.0mm · 1.77mm/px · 4 of 58 slices shown]
[im 1/58]
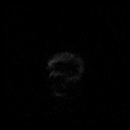
[im 20/58]
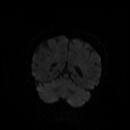
[im 39/58]
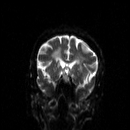
[im 58/58]
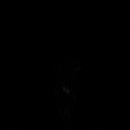

[Series 6: ep2d_diff_cor_adc · coronal · 5.0mm · 1.77mm/px · 2 of 30 slices shown]
[im 1/30]
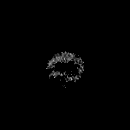
[im 30/30]
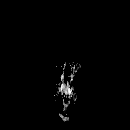

[Series 7: FLAIR · axial · 3.0mm · 0.45mm/px · z∈[-55,+93]mm · 2 of 26 slices shown]
[im 1/26]
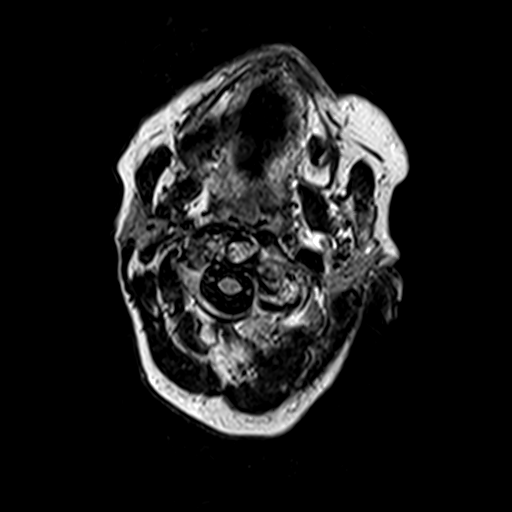
[im 26/26]
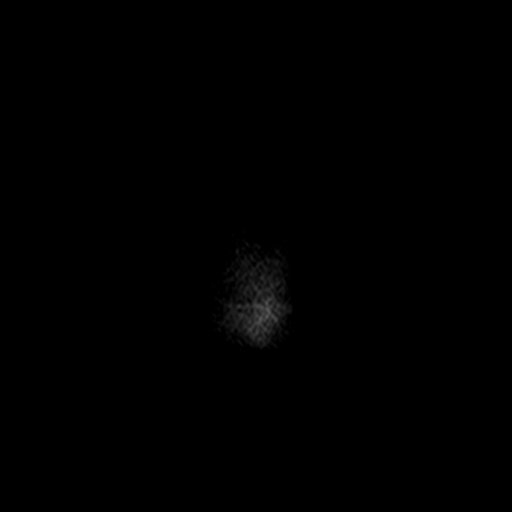

[Series 8: t2_tse_tra · axial · 5.0mm · 0.60mm/px · z∈[-59,+100]mm · 2 of 28 slices shown]
[im 1/28]
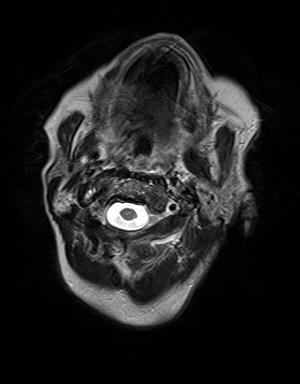
[im 28/28]
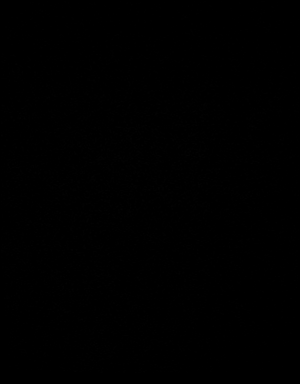

[Series 10: swi_images · axial · 2.0mm · 0.90mm/px · z∈[-57,+98]mm · 5 of 80 slices shown]
[im 1/80]
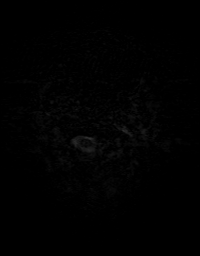
[im 20/80]
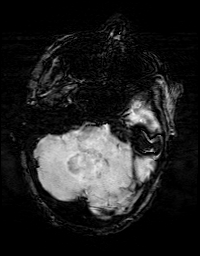
[im 40/80]
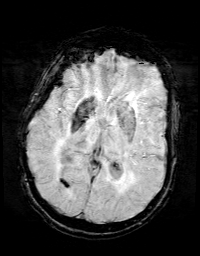
[im 60/80]
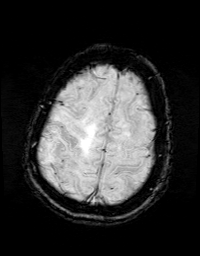
[im 80/80]
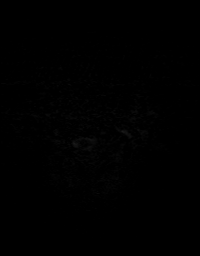

[Series 11: t1_mpr_tra · axial · 1.1mm · 0.72mm/px · z∈[-60,+95]mm · 9 of 144 slices shown]
[im 1/144]
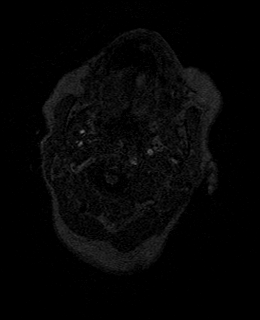
[im 18/144]
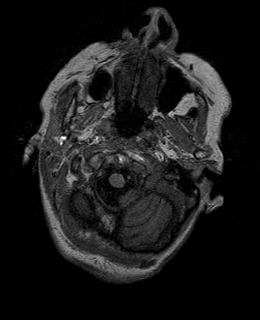
[im 36/144]
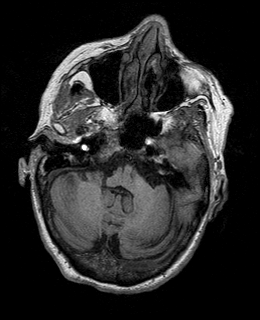
[im 54/144]
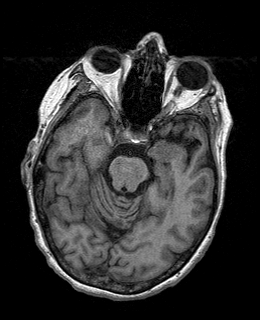
[im 72/144]
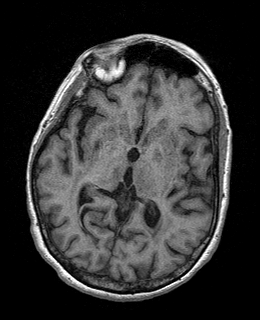
[im 90/144]
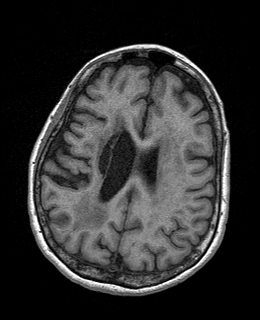
[im 108/144]
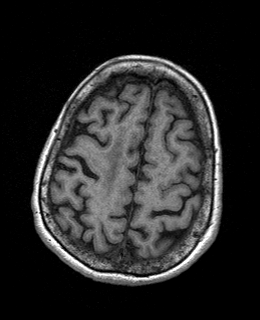
[im 126/144]
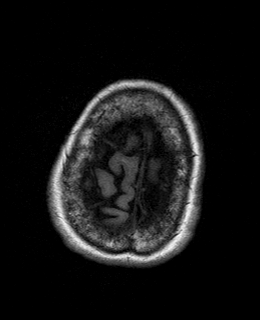
[im 144/144]
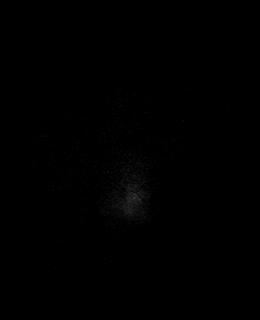

[Series 12: T2 post-contrast · coronal · 5.0mm · 0.45mm/px · 2 of 30 slices shown]
[im 1/30]
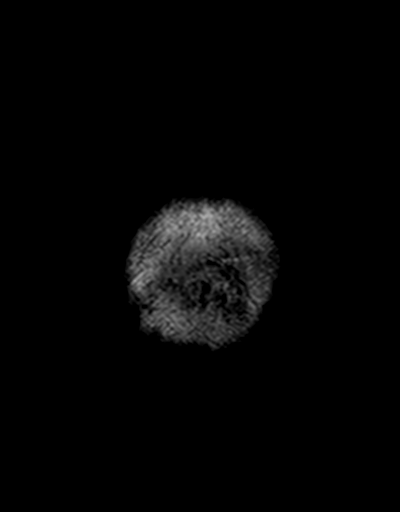
[im 30/30]
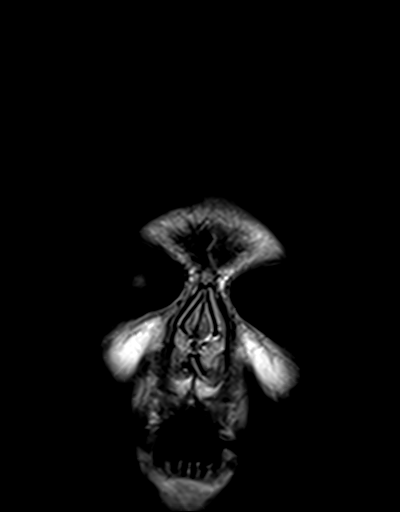

[Series 13: post t1_mpr_tra · axial · 1.1mm · 0.72mm/px · z∈[-60,+95]mm · 9 of 144 slices shown]
[im 1/144]
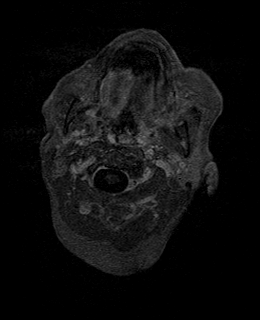
[im 18/144]
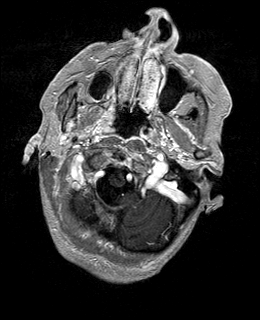
[im 36/144]
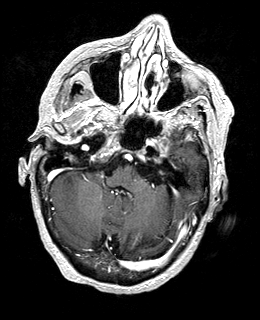
[im 54/144]
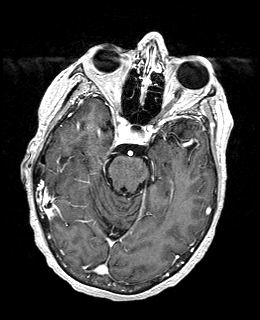
[im 72/144]
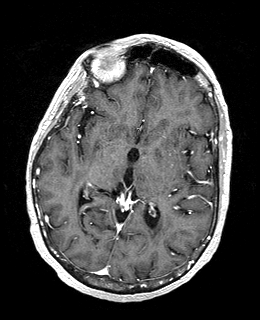
[im 90/144]
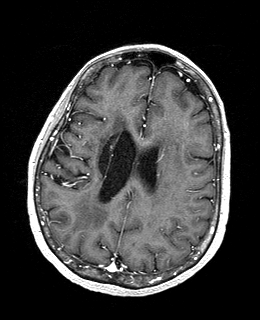
[im 108/144]
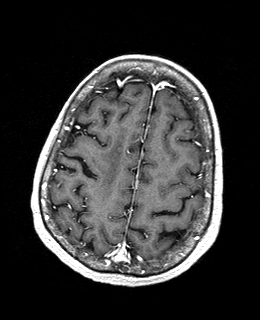
[im 126/144]
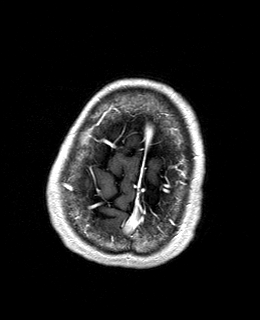
[im 144/144]
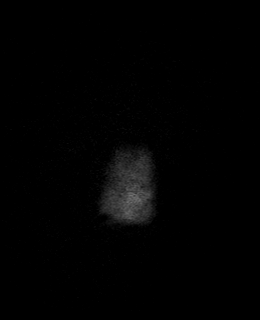

[Series 14: T1 post-contrast · coronal · 5.0mm · 0.72mm/px · 2 of 30 slices shown]
[im 1/30]
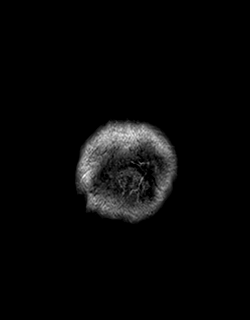
[im 30/30]
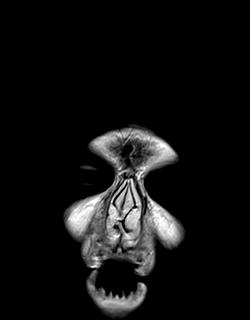

[48 of 48 positions shown; findings below may reference images not displayed]

FINDINGS: BRAIN: There is no acute infarct, acute hemorrhage or extra-axial
collection. Diffuse confluent hyperintense T2-weighted signal within
the periventricular, deep and juxtacortical white matter, most
commonly due to chronic ischemic microangiopathy. There is an old
infarct of the right centrum semiovale. There is generalized atrophy
without lobar predilection. The midline structures are normal. There
is no abnormal contrast enhancement.

VASCULAR: The major intracranial arterial and venous sinus flow
voids are normal. Susceptibility-sensitive sequences show no chronic
microhemorrhage or superficial siderosis.

SKULL AND UPPER CERVICAL SPINE: Calvarial bone marrow signal is
normal. There is no skull base mass. The visualized upper cervical
spine and soft tissues are normal.

SINUSES/ORBITS: There are no fluid levels or advanced mucosal
thickening. The mastoid air cells and middle ear cavities are free
of fluid. The orbits are normal.
IMPRESSION: 1. No acute intracranial abnormality.
2. Advanced chronic small vessel disease and generalized atrophy.
3. Old right centrum semiovale infarct.

## 2020-09-21 ENCOUNTER — Other Ambulatory Visit: Payer: Self-pay | Admitting: Physical Medicine & Rehabilitation

## 2020-09-22 ENCOUNTER — Telehealth: Payer: Self-pay | Admitting: *Deleted

## 2020-09-22 NOTE — Telephone Encounter (Signed)
Ms Ronnald Ramp, Mrs Nevares's daughter called about a refill on her baclofen.  Your note says to continue but I don't see that we have ordered it since May 2021.

## 2020-09-22 NOTE — Telephone Encounter (Signed)
We can refill it, but she needs to schedule a follow up appointment.  Thanks.

## 2020-09-25 MED ORDER — BACLOFEN 10 MG PO TABS: ORAL_TABLET | ORAL | 0 refills | Status: DC

## 2020-09-25 NOTE — Addendum Note (Signed)
Addended by: Caro Hight on: 09/25/2020 09:16 AM   Modules accepted: Orders

## 2020-09-25 NOTE — Telephone Encounter (Signed)
Thanks

## 2020-10-09 DIAGNOSIS — M21372 Foot drop, left foot: Secondary | ICD-10-CM | POA: Diagnosis not present

## 2020-10-09 DIAGNOSIS — L84 Corns and callosities: Secondary | ICD-10-CM | POA: Diagnosis not present

## 2020-10-09 DIAGNOSIS — B351 Tinea unguium: Secondary | ICD-10-CM | POA: Diagnosis not present

## 2020-10-09 DIAGNOSIS — M79672 Pain in left foot: Secondary | ICD-10-CM | POA: Diagnosis not present

## 2020-10-09 DIAGNOSIS — G608 Other hereditary and idiopathic neuropathies: Secondary | ICD-10-CM | POA: Diagnosis not present

## 2020-10-09 DIAGNOSIS — D2372 Other benign neoplasm of skin of left lower limb, including hip: Secondary | ICD-10-CM | POA: Diagnosis not present

## 2020-10-09 DIAGNOSIS — R2689 Other abnormalities of gait and mobility: Secondary | ICD-10-CM | POA: Diagnosis not present

## 2020-10-24 DIAGNOSIS — F339 Major depressive disorder, recurrent, unspecified: Secondary | ICD-10-CM | POA: Diagnosis not present

## 2020-11-09 ENCOUNTER — Encounter: Payer: Medicare Other | Attending: Physical Medicine & Rehabilitation | Admitting: Physical Medicine & Rehabilitation

## 2020-11-09 ENCOUNTER — Encounter: Payer: Self-pay | Admitting: Physical Medicine & Rehabilitation

## 2020-11-09 ENCOUNTER — Other Ambulatory Visit: Payer: Self-pay

## 2020-11-09 VITALS — BP 134/82 | HR 61 | Temp 98.5°F | Ht 67.0 in | Wt 131.0 lb

## 2020-11-09 DIAGNOSIS — R209 Unspecified disturbances of skin sensation: Secondary | ICD-10-CM | POA: Insufficient documentation

## 2020-11-09 DIAGNOSIS — I69398 Other sequelae of cerebral infarction: Secondary | ICD-10-CM | POA: Diagnosis not present

## 2020-11-09 DIAGNOSIS — R269 Unspecified abnormalities of gait and mobility: Secondary | ICD-10-CM | POA: Diagnosis not present

## 2020-11-09 DIAGNOSIS — G479 Sleep disorder, unspecified: Secondary | ICD-10-CM | POA: Diagnosis not present

## 2020-11-09 DIAGNOSIS — G8110 Spastic hemiplegia affecting unspecified side: Secondary | ICD-10-CM

## 2020-11-09 MED ORDER — PREGABALIN 50 MG PO CAPS: 50.0000 mg | ORAL_CAPSULE | Freq: Two times a day (BID) | ORAL | 1 refills | Status: DC

## 2020-11-09 NOTE — Progress Notes (Addendum)
Subjective:    Patient ID: Kelly Lowe, female    DOB: Jun 15, 1951, 70 y.o.   MRN: [id number]  HPI  Female with history of HTN, right foot fractures presents for follow for right basal ganglia infarct.   Last clinic visit on 07/20/20.  Daughter provides history. Since that time, pt states she no longer wants to pursue Botulinum toxin injections. She tolerated Baclofen during the day. Sleep has improved. She is on medications for depression. Daughter notes memory issues. She is still on the wait list for home assistance.  She is staying by herself with some assistance. Is using a cane, no falls.  Pain Inventory Average Pain 2 Pain Right Now 2 My pain is dull  In the last 24 hours, has pain interfered with the following? General activity 2 Relation with others 2 Enjoyment of life 2 What TIME of day is your pain at its worst? night Sleep (in general) Fair  Pain is worse with: some activites Pain improves with: rest and heat/ice Relief from Meds: 1   Family History  Problem Relation Age of Onset  . Stroke Maternal Grandmother   . Stroke Paternal Grandfather    Social History   Socioeconomic History  . Marital status: Divorced    Spouse name: Not on file  . Number of children: Not on file  . Years of education: Not on file  . Highest education level: Not on file  Occupational History  . Not on file  Tobacco Use  . Smoking status: Former Smoker    Packs/day: 0.50    Years: 25.00    Pack years: 12.50    Types: Cigarettes    Quit date: 05/02/2017    Years since quitting: 3.5  . Smokeless tobacco: Never Used  Vaping Use  . Vaping Use: Never used  Substance and Sexual Activity  . Alcohol use: No    Alcohol/week: 1.0 standard drink    Types: 1 Glasses of wine per week    Comment: social  . Drug use: No  . Sexual activity: Never  Other Topics Concern  . Not on file  Social History Narrative  . Not on file   Social Determinants of Health   Financial  Resource Strain: Not on file  Food Insecurity: Not on file  Transportation Needs: Not on file  Physical Activity: Not on file  Stress: Not on file  Social Connections: Not on file   Past Surgical History:  Procedure Laterality Date  . FOOT SURGERY    . LOOP RECORDER INSERTION N/A 10/08/2017   Procedure: LOOP RECORDER INSERTION;  Surgeon: Deboraha Sprang, MD;  Location: [place] [facility];  Service: Cardiovascular;  Laterality: N/A;  . TEE WITHOUT CARDIOVERSION N/A 05/08/2017   Procedure: TRANSESOPHAGEAL ECHOCARDIOGRAM (TEE);  Surgeon: Lelon Perla, MD;  Location: [facility];  Service: Cardiovascular;  Laterality: N/A;   Past Medical History:  Diagnosis Date  . Hyperlipidemia   . Hypertension   . Stroke (HCC)    BP 134/82   Pulse 61   Temp 98.5 F (36.9 C)   Ht 5\' 7"  (1.702 m)   Wt 131 lb (59.4 kg)   SpO2 95%   BMI 20.52 kg/m   Opioid Risk Score:   Fall Risk Score:  `1  Depression screen PHQ 2/9  Depression screen [facility] 2/9 11/09/2020 04/13/2020 01/04/2020 12/30/2018 04/09/2018 01/09/2018 11/28/2017  Decreased Interest 0 3 3 0 0 0 1  Down, Depressed, Hopeless 0 3 3 0 0 0 1  PHQ - 2 Score 0 6 6 0 0 0 2  Altered sleeping - - - - - - 1  Tired, decreased energy - - - - - - 1  Change in appetite - - - - - - 1  Feeling bad or failure about yourself  - - - - - - 1  Trouble concentrating - - - - - - 1  Moving slowly or fidgety/restless - - - - - - 1  Suicidal thoughts - - - - - - 0  PHQ-9 Score - - - - - - 8  Difficult doing work/chores - - - - - - Somewhat difficult    Review of Systems  Constitutional: Negative.   HENT: Negative.   Eyes: Negative.   Respiratory: Negative.   Cardiovascular: Negative.   Gastrointestinal: Positive for constipation.       Poor appetite  Endocrine: Negative.   Genitourinary: Positive for urgency.  Musculoskeletal: Positive for arthralgias.       Spasms, pain in left shoulder,left hand aching,  Skin: Positive for rash.  Allergic/Immunologic:  Negative.   Neurological: Positive for weakness and numbness.       Tingling   Hematological: Negative.   Psychiatric/Behavioral: Positive for confusion and dysphoric mood. The patient is nervous/anxious.   All other systems reviewed and are negative.     Objective:   Physical Exam  Constitutional: No distress . Vital signs reviewed. HENT: Normocephalic.  Atraumatic. Eyes: EOMI. No discharge. Cardiovascular: No JVD.   Respiratory: Normal effort.  No stridor.   GI: Non-distended.   Skin: Warm and dry.  Intact. Psych: Normal mood.  Normal behavior. Musc: No edema in extremities.  No tenderness in extremities. Left shoulder subluxation Gait: Hemiparetic Neurological: Alert Motor: LUE: 2+/5 shoulder abduction, elbow flex 3+/5, elbow ext 2-/5, hand grip 4-/5 with apraxia LLE: 4-/5 HF, 4/5 KE, ADF/PF 2/5  mAS: left shoulder abductors: 2/4, elbow flexors: 2/4, wrist flexors 2/4, finger flexors 2/4, thumb flexors 2/4 Left ADF/PF 1+/4 Follows commands.  Insight and awareness into deficits.     Assessment & Plan:  Femalewith history of HTN, right foot fractures presents for follow for right basal ganglia infarct.   1. Left spastic hemiparesis, fatigue, and functional deficitssecondary to right basal ganglia infarct  Continue HEP No benefit with amantadine  Encouraged WHO/PRAFO  Patient would like to hold off on Dysport injections             Previously:               Left Med Pec 100               Left biceps 300              Left FCR 100U               Left FCU 100U              Left FDS 200U              Left FDP 200U               Left Med Gastroc 150              Left Lat Gastroc 150   Continue Baclofen to 10 qhs, 5mg  in AM  On wait list for home assistance   2. Gait abnormality  Continue cane for safety  3. Sleep disturbance  Improved overall  4. Mood lability: Patient, now benefiting with Psychology  Defer Cymbalta management to  Psychiatry Nudexta ordered by Neurology - but cannot afford  Continue following up with Psychiatry  Major limiting factor - continues to be at present  Following up with Psych  5. Left shoulder subluxation with pain Kinesio taping on hold due to reaction to tape SPRINT placed on 9/5, removed on 11/6 Tramadol per PCP (educated on signs/symptoms serotonin syndrome) Ortho consult ordered by Neurology NP - recommended therapies, which she completed  6. Bony prominence right wrist Xray with bony prominence Voltaren gel in effective  Good benefits with injection on 9/26  7. Right hand numbness Managed at present Positional             Improved  8. Urinary urgency  Unlikely stroke related  Improving  9. Pseudodementia   See #4  Encouraged brain simulating activities  10. Dysesthesias in tongue  Did not respond to Gabapentin  Will order Lyrica 50 BID

## 2020-11-11 ENCOUNTER — Other Ambulatory Visit: Payer: Self-pay | Admitting: Physical Medicine & Rehabilitation

## 2020-11-15 DIAGNOSIS — F411 Generalized anxiety disorder: Secondary | ICD-10-CM | POA: Diagnosis not present

## 2020-11-21 DIAGNOSIS — F339 Major depressive disorder, recurrent, unspecified: Secondary | ICD-10-CM | POA: Diagnosis not present

## 2020-12-15 DIAGNOSIS — F411 Generalized anxiety disorder: Secondary | ICD-10-CM | POA: Diagnosis not present

## 2020-12-19 DIAGNOSIS — F339 Major depressive disorder, recurrent, unspecified: Secondary | ICD-10-CM | POA: Diagnosis not present

## 2020-12-21 ENCOUNTER — Encounter: Payer: Self-pay | Admitting: Physical Medicine & Rehabilitation

## 2020-12-21 ENCOUNTER — Other Ambulatory Visit: Payer: Self-pay | Admitting: Physical Medicine & Rehabilitation

## 2020-12-21 ENCOUNTER — Encounter: Payer: Medicare Other | Attending: Physical Medicine & Rehabilitation | Admitting: Physical Medicine & Rehabilitation

## 2020-12-21 ENCOUNTER — Other Ambulatory Visit: Payer: Self-pay

## 2020-12-21 VITALS — BP 136/77 | HR 62 | Temp 98.4°F | Ht 67.0 in | Wt 134.0 lb

## 2020-12-21 DIAGNOSIS — R209 Unspecified disturbances of skin sensation: Secondary | ICD-10-CM

## 2020-12-21 DIAGNOSIS — R269 Unspecified abnormalities of gait and mobility: Secondary | ICD-10-CM | POA: Diagnosis not present

## 2020-12-21 DIAGNOSIS — I69359 Hemiplegia and hemiparesis following cerebral infarction affecting unspecified side: Secondary | ICD-10-CM | POA: Insufficient documentation

## 2020-12-21 DIAGNOSIS — I69398 Other sequelae of cerebral infarction: Secondary | ICD-10-CM | POA: Diagnosis not present

## 2020-12-21 DIAGNOSIS — S43002S Unspecified subluxation of left shoulder joint, sequela: Secondary | ICD-10-CM | POA: Insufficient documentation

## 2020-12-21 DIAGNOSIS — G8110 Spastic hemiplegia affecting unspecified side: Secondary | ICD-10-CM | POA: Insufficient documentation

## 2020-12-21 MED ORDER — PREGABALIN 50 MG PO CAPS: 50.0000 mg | ORAL_CAPSULE | Freq: Two times a day (BID) | ORAL | 4 refills | Status: DC

## 2020-12-21 NOTE — Progress Notes (Addendum)
Subjective:    Patient ID: Kelly Lowe, female    DOB: 1950-11-13, 70 y.o.   MRN: [id number]  HPI  Female with history of HTN, right foot fractures presents for follow for right basal ganglia infarct.   Last clinic visit on 11/09/20.  Daughter supplements history. Since that time, pt states she is doing a lot of walking at home. She has limited use of sling. She is taking Baclofen 10qhs only. Denies falls. Sleep has improved.  She continues to follow up with Psychology. She states she is reading more and working on puzzles and writing letters and speaking with friends.  She notes benefit with these activities. Denies falls.   Pain Inventory Average Pain 2 Pain Right Now 2 My pain is dull  In the last 24 hours, has pain interfered with the following? General activity 2 Relation with others 2 Enjoyment of life 2 What TIME of day is your pain at its worst? night Sleep (in general) Fair  Pain is worse with: some activites Pain improves with: rest and heat/ice Relief from Meds: 1   Family History  Problem Relation Age of Onset  . Stroke Maternal Grandmother   . Stroke Paternal Grandfather    Social History   Socioeconomic History  . Marital status: Divorced    Spouse name: Not on file  . Number of children: Not on file  . Years of education: Not on file  . Highest education level: Not on file  Occupational History  . Not on file  Tobacco Use  . Smoking status: Former Smoker    Packs/day: 0.50    Years: 25.00    Pack years: 12.50    Types: Cigarettes    Quit date: 05/02/2017    Years since quitting: 3.6  . Smokeless tobacco: Never Used  Vaping Use  . Vaping Use: Never used  Substance and Sexual Activity  . Alcohol use: No    Alcohol/week: 1.0 standard drink    Types: 1 Glasses of wine per week    Comment: social  . Drug use: No  . Sexual activity: Never  Other Topics Concern  . Not on file  Social History Narrative  . Not on file   Social Determinants  of Health   Financial Resource Strain: Not on file  Food Insecurity: Not on file  Transportation Needs: Not on file  Physical Activity: Not on file  Stress: Not on file  Social Connections: Not on file   Past Surgical History:  Procedure Laterality Date  . FOOT SURGERY    . LOOP RECORDER INSERTION N/A 10/08/2017   Procedure: LOOP RECORDER INSERTION;  Surgeon: Deboraha Sprang, MD;  Location: [place] [facility];  Service: Cardiovascular;  Laterality: N/A;  . TEE WITHOUT CARDIOVERSION N/A 05/08/2017   Procedure: TRANSESOPHAGEAL ECHOCARDIOGRAM (TEE);  Surgeon: Lelon Perla, MD;  Location: [facility] ENDOSCOPY;  Service: Cardiovascular;  Laterality: N/A;   Past Medical History:  Diagnosis Date  . Hyperlipidemia   . Hypertension   . Stroke (HCC)    BP 136/77   Pulse 62   Temp 98.4 F (36.9 C)   Ht 5\' 7"  (1.702 m)   Wt 134 lb (60.8 kg)   SpO2 97%   BMI 20.99 kg/m   Opioid Risk Score:   Fall Risk Score:  `1  Depression screen PHQ 2/9  Depression screen [facility] 2/9 11/09/2020 04/13/2020 01/04/2020 12/30/2018 04/09/2018 01/09/2018 11/28/2017  Decreased Interest 0 3 3 0 0 0 1  Down, Depressed,  Hopeless 0 3 3 0 0 0 1  PHQ - 2 Score 0 6 6 0 0 0 2  Altered sleeping - - - - - - 1  Tired, decreased energy - - - - - - 1  Change in appetite - - - - - - 1  Feeling bad or failure about yourself  - - - - - - 1  Trouble concentrating - - - - - - 1  Moving slowly or fidgety/restless - - - - - - 1  Suicidal thoughts - - - - - - 0  PHQ-9 Score - - - - - - 8  Difficult doing work/chores - - - - - - Somewhat difficult    Review of Systems  Constitutional: Negative.   HENT: Negative.   Eyes: Negative.   Respiratory: Negative.   Cardiovascular: Negative.   Gastrointestinal: Positive for constipation.       Poor appetite  Endocrine: Negative.   Genitourinary: Positive for urgency.  Musculoskeletal: Positive for arthralgias.       Spasms, pain in left shoulder,left hand aching,  Skin: Positive for rash.   Allergic/Immunologic: Negative.   Neurological: Positive for weakness and numbness.       Tingling   Hematological: Negative.   Psychiatric/Behavioral: Positive for confusion and dysphoric mood. The patient is nervous/anxious.   All other systems reviewed and are negative.     Objective:   Physical Exam  Constitutional: No distress . Vital signs reviewed. HENT: Normocephalic.  Atraumatic. Eyes: EOMI. No discharge. Cardiovascular: No JVD.   Respiratory: Normal effort.  No stridor.   GI: Non-distended.   Skin: Warm and dry.  Intact. Psych: Very tearful.  Musc:  Left shoulder subluxation, unchanged Gait: Hemiparetic Neurological: Alert Motor: LUE: 2+/5 shoulder abduction, elbow flex 3+/5, elbow ext 2-/5, hand grip 4-/5 with apraxia LLE: 4-/5 HF, 4/5 KE, ADF/PF 2/5  mAS: left shoulder abductors: 1/4, elbow flexors: 1+/4, wrist flexors 2/4, finger flexors 1/4, thumb flexors 1/4 Left ADF/PF 1+/4 Follows commands.  Insight and awareness into deficits.     Assessment & Plan:  Femalewith history of HTN, right foot fractures presents for follow for right basal ganglia infarct.   1. Left spastic hemiparesis, fatigue, and functional deficitssecondary to right basal ganglia infarct  Continue HEP No benefit with amantadine  Encouraged WHO/PRAFO  Patient would like to hold off on Dysport injections             Previously:               Left Med Pec 100               Left biceps 300              Left FCR 100U               Left FCU 100U              Left FDS 200U              Left FDP 200U               Left Med Gastroc 150              Left Lat Gastroc 150   Continue Baclofen to 10 qhs  Previously educated on limiting sling use, which patient is now doing resulting in decreased tone - cont ROM  On wait list for home assistance   2. Gait abnormality  Continue cane for safety  3. Sleep disturbance  Improved overall  4. Mood  lability: Patient, now benefiting with Psychology  Defer Cymbalta management to Psychiatry Nudexta ordered by Neurology - but cannot afford  Continue following up with Psychiatry  Major limiting factor - continues to be at present  Continue to follow up with Psych  5. Left shoulder subluxation with pain Kinesio taping on hold due to reaction to tape SPRINT placed on 9/5, removed on 11/6 Tramadol per PCP (educated on signs/symptoms serotonin syndrome) Ortho consult ordered by Neurology NP - recommended therapies, which she completed  6. Bony prominence right wrist Xray with bony prominence Voltaren gel in effective  Good benefits with injection on 9/26, will schedule again  7. Right hand numbness Managed at present Positional             Improved  8. Urinary urgency  Unlikely stroke related  Improving  9. Pseudodementia   See #4  Encouraged brain simulating activities  10. Dysesthesias in tongue  Did not respond to Gabapentin  Continue Lyrica 50 BID with good benefit

## 2020-12-26 ENCOUNTER — Ambulatory Visit (INDEPENDENT_AMBULATORY_CARE_PROVIDER_SITE_OTHER): Payer: Medicare Other

## 2020-12-26 DIAGNOSIS — I6300 Cerebral infarction due to thrombosis of unspecified precerebral artery: Secondary | ICD-10-CM | POA: Diagnosis not present

## 2020-12-27 LAB — CUP PACEART REMOTE DEVICE CHECK
Date Time Interrogation Session: 20220521003808
Implantable Pulse Generator Implant Date: 20190306

## 2021-01-03 ENCOUNTER — Other Ambulatory Visit: Payer: Self-pay | Admitting: Physical Medicine & Rehabilitation

## 2021-01-03 ENCOUNTER — Telehealth: Payer: Self-pay

## 2021-01-03 DIAGNOSIS — I69398 Other sequelae of cerebral infarction: Secondary | ICD-10-CM

## 2021-01-03 DIAGNOSIS — M792 Neuralgia and neuritis, unspecified: Secondary | ICD-10-CM

## 2021-01-03 DIAGNOSIS — G8110 Spastic hemiplegia affecting unspecified side: Secondary | ICD-10-CM

## 2021-01-03 DIAGNOSIS — R209 Unspecified disturbances of skin sensation: Secondary | ICD-10-CM

## 2021-01-03 NOTE — Telephone Encounter (Signed)
Caller aware Dr. Posey Pronto is not in the office-/ today.  Kelly Lowe has swelling in both legs. Per [name] its been going on for 2 weeks.   1.   She want to know if the Pregabalin is the cause?   2.  If not please send in refill of Pregabalin.   Please advise. Thank you. CB# [phone].

## 2021-01-03 NOTE — Telephone Encounter (Signed)
The patient daughter wanted to know if anything on her loop. Her legs been swollen for two weeks. I asked for a manual transmission but the patient is downstairs until bedtime. I asked could she get the monitor and bring it to the patient so we can get the transmission and the daughter states she is not in the same states as the patient. I let her speak with Cindy, rn.

## 2021-01-03 NOTE — Telephone Encounter (Addendum)
It is possible, however, she has been on this medication for about 2 months now.  I would decrease her salt intake, elevate her legs, and called the PCP. There should be 4 refills on the Lyrica. Thanks.

## 2021-01-03 NOTE — Telephone Encounter (Signed)
Spoke with daughter on [place]. Reviewed LINQ transmission from 01/03/21 that showed no episodes. Advised family that patient should be seen by PCP for BLE edema.

## 2021-01-15 DIAGNOSIS — F339 Major depressive disorder, recurrent, unspecified: Secondary | ICD-10-CM | POA: Diagnosis not present

## 2021-01-16 ENCOUNTER — Other Ambulatory Visit: Payer: Self-pay | Admitting: Physical Medicine & Rehabilitation

## 2021-01-17 NOTE — Progress Notes (Signed)
[facility] Summary Report / Loop Recorder 

## 2021-01-23 ENCOUNTER — Ambulatory Visit (INDEPENDENT_AMBULATORY_CARE_PROVIDER_SITE_OTHER): Payer: Medicare Other

## 2021-01-23 DIAGNOSIS — I679 Cerebrovascular disease, unspecified: Secondary | ICD-10-CM | POA: Diagnosis not present

## 2021-01-23 DIAGNOSIS — M810 Age-related osteoporosis without current pathological fracture: Secondary | ICD-10-CM | POA: Diagnosis not present

## 2021-01-23 DIAGNOSIS — E785 Hyperlipidemia, unspecified: Secondary | ICD-10-CM | POA: Diagnosis not present

## 2021-01-23 DIAGNOSIS — I6523 Occlusion and stenosis of bilateral carotid arteries: Secondary | ICD-10-CM | POA: Diagnosis not present

## 2021-01-23 DIAGNOSIS — I10 Essential (primary) hypertension: Secondary | ICD-10-CM | POA: Diagnosis not present

## 2021-01-23 DIAGNOSIS — I69354 Hemiplegia and hemiparesis following cerebral infarction affecting left non-dominant side: Secondary | ICD-10-CM | POA: Diagnosis not present

## 2021-01-23 DIAGNOSIS — I6300 Cerebral infarction due to thrombosis of unspecified precerebral artery: Secondary | ICD-10-CM

## 2021-01-23 DIAGNOSIS — Z1389 Encounter for screening for other disorder: Secondary | ICD-10-CM | POA: Diagnosis not present

## 2021-01-23 DIAGNOSIS — Z0000 Encounter for general adult medical examination without abnormal findings: Secondary | ICD-10-CM | POA: Diagnosis not present

## 2021-01-23 DIAGNOSIS — F339 Major depressive disorder, recurrent, unspecified: Secondary | ICD-10-CM | POA: Diagnosis not present

## 2021-01-23 DIAGNOSIS — Z1211 Encounter for screening for malignant neoplasm of colon: Secondary | ICD-10-CM | POA: Diagnosis not present

## 2021-01-23 DIAGNOSIS — F3341 Major depressive disorder, recurrent, in partial remission: Secondary | ICD-10-CM | POA: Diagnosis not present

## 2021-01-25 LAB — CUP PACEART REMOTE DEVICE CHECK
Date Time Interrogation Session: 20220621004409
Implantable Pulse Generator Implant Date: 20190306

## 2021-01-30 DIAGNOSIS — M79672 Pain in left foot: Secondary | ICD-10-CM | POA: Diagnosis not present

## 2021-01-30 DIAGNOSIS — M24572 Contracture, left ankle: Secondary | ICD-10-CM | POA: Diagnosis not present

## 2021-01-30 DIAGNOSIS — G608 Other hereditary and idiopathic neuropathies: Secondary | ICD-10-CM | POA: Diagnosis not present

## 2021-01-30 DIAGNOSIS — M21372 Foot drop, left foot: Secondary | ICD-10-CM | POA: Diagnosis not present

## 2021-01-30 DIAGNOSIS — R2689 Other abnormalities of gait and mobility: Secondary | ICD-10-CM | POA: Diagnosis not present

## 2021-01-30 DIAGNOSIS — B351 Tinea unguium: Secondary | ICD-10-CM | POA: Diagnosis not present

## 2021-01-30 DIAGNOSIS — D2372 Other benign neoplasm of skin of left lower limb, including hip: Secondary | ICD-10-CM | POA: Diagnosis not present

## 2021-02-09 DIAGNOSIS — I10 Essential (primary) hypertension: Secondary | ICD-10-CM | POA: Diagnosis not present

## 2021-02-09 DIAGNOSIS — I69354 Hemiplegia and hemiparesis following cerebral infarction affecting left non-dominant side: Secondary | ICD-10-CM | POA: Diagnosis not present

## 2021-02-12 NOTE — Progress Notes (Signed)
[facility] Summary Report / Loop Recorder 

## 2021-02-14 DIAGNOSIS — I69354 Hemiplegia and hemiparesis following cerebral infarction affecting left non-dominant side: Secondary | ICD-10-CM | POA: Diagnosis not present

## 2021-02-14 DIAGNOSIS — I10 Essential (primary) hypertension: Secondary | ICD-10-CM | POA: Diagnosis not present

## 2021-02-14 DIAGNOSIS — F339 Major depressive disorder, recurrent, unspecified: Secondary | ICD-10-CM | POA: Diagnosis not present

## 2021-02-15 DIAGNOSIS — I10 Essential (primary) hypertension: Secondary | ICD-10-CM | POA: Diagnosis not present

## 2021-02-15 DIAGNOSIS — I69354 Hemiplegia and hemiparesis following cerebral infarction affecting left non-dominant side: Secondary | ICD-10-CM | POA: Diagnosis not present

## 2021-02-16 DIAGNOSIS — I10 Essential (primary) hypertension: Secondary | ICD-10-CM | POA: Diagnosis not present

## 2021-02-16 DIAGNOSIS — I69354 Hemiplegia and hemiparesis following cerebral infarction affecting left non-dominant side: Secondary | ICD-10-CM | POA: Diagnosis not present

## 2021-02-22 DIAGNOSIS — M21372 Foot drop, left foot: Secondary | ICD-10-CM | POA: Diagnosis not present

## 2021-02-22 DIAGNOSIS — M24572 Contracture, left ankle: Secondary | ICD-10-CM | POA: Diagnosis not present

## 2021-02-22 DIAGNOSIS — M79672 Pain in left foot: Secondary | ICD-10-CM | POA: Diagnosis not present

## 2021-02-22 IMAGING — DX DG CHEST 1V
1 series · 1 of 1 positions shown · non-contrast
Comparison: None.

CLINICAL DATA: Leukocytosis

EXAM:
CHEST  1 VIEW

[chest ap]
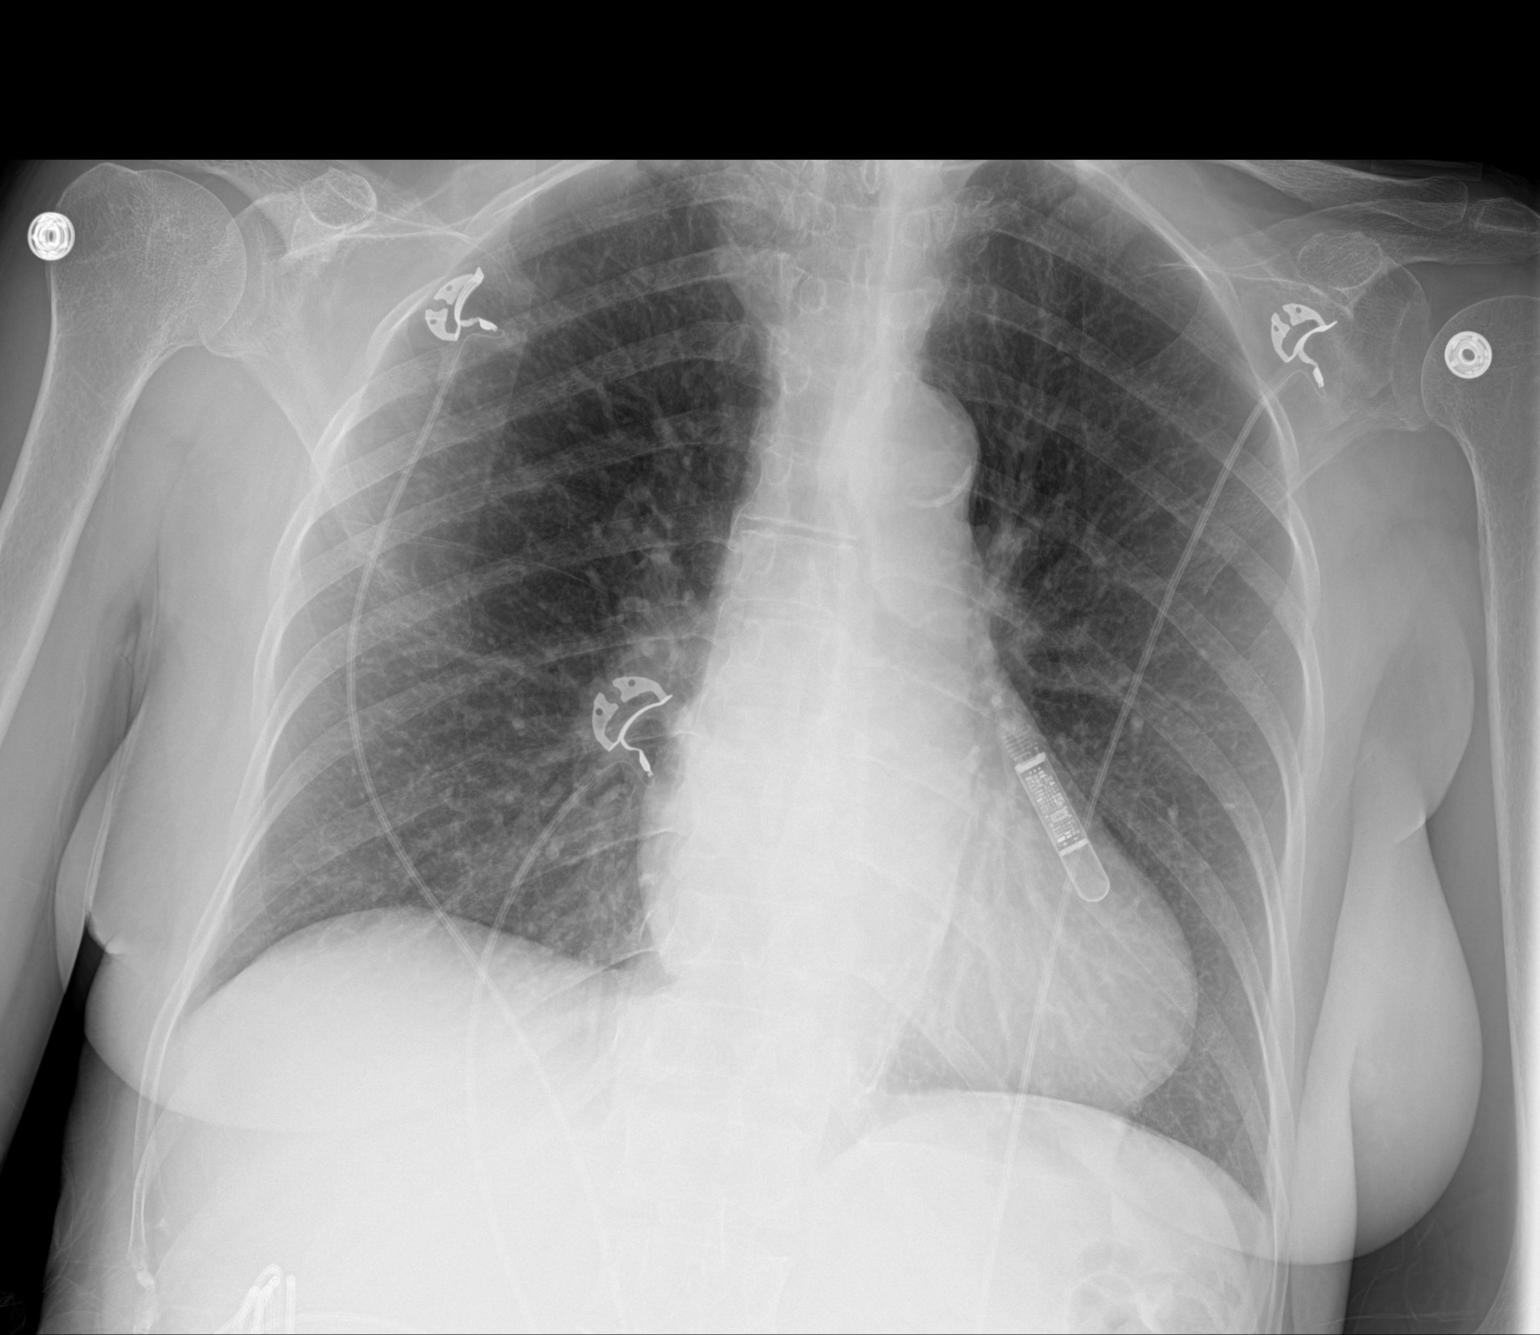

[1 of 1 positions shown; findings below may reference images not displayed]

FINDINGS: Loop recorder device projects over the left chest. Heart and
mediastinal contours are within normal limits. No focal opacities or
effusions. No acute bony abnormality.
IMPRESSION: No active disease.

## 2021-02-26 ENCOUNTER — Ambulatory Visit (INDEPENDENT_AMBULATORY_CARE_PROVIDER_SITE_OTHER): Payer: Medicare Other

## 2021-02-26 DIAGNOSIS — I6300 Cerebral infarction due to thrombosis of unspecified precerebral artery: Secondary | ICD-10-CM

## 2021-02-28 DIAGNOSIS — I10 Essential (primary) hypertension: Secondary | ICD-10-CM | POA: Diagnosis not present

## 2021-02-28 DIAGNOSIS — I69354 Hemiplegia and hemiparesis following cerebral infarction affecting left non-dominant side: Secondary | ICD-10-CM | POA: Diagnosis not present

## 2021-02-28 LAB — CUP PACEART REMOTE DEVICE CHECK
Date Time Interrogation Session: 20220722004633
Implantable Pulse Generator Implant Date: 20190306

## 2021-03-02 DIAGNOSIS — I10 Essential (primary) hypertension: Secondary | ICD-10-CM | POA: Diagnosis not present

## 2021-03-02 DIAGNOSIS — I69354 Hemiplegia and hemiparesis following cerebral infarction affecting left non-dominant side: Secondary | ICD-10-CM | POA: Diagnosis not present

## 2021-03-05 DIAGNOSIS — I10 Essential (primary) hypertension: Secondary | ICD-10-CM | POA: Diagnosis not present

## 2021-03-05 DIAGNOSIS — I69354 Hemiplegia and hemiparesis following cerebral infarction affecting left non-dominant side: Secondary | ICD-10-CM | POA: Diagnosis not present

## 2021-03-08 DIAGNOSIS — I10 Essential (primary) hypertension: Secondary | ICD-10-CM | POA: Diagnosis not present

## 2021-03-08 DIAGNOSIS — I69354 Hemiplegia and hemiparesis following cerebral infarction affecting left non-dominant side: Secondary | ICD-10-CM | POA: Diagnosis not present

## 2021-03-11 DIAGNOSIS — I10 Essential (primary) hypertension: Secondary | ICD-10-CM | POA: Diagnosis not present

## 2021-03-11 DIAGNOSIS — I69354 Hemiplegia and hemiparesis following cerebral infarction affecting left non-dominant side: Secondary | ICD-10-CM | POA: Diagnosis not present

## 2021-03-13 DIAGNOSIS — I69354 Hemiplegia and hemiparesis following cerebral infarction affecting left non-dominant side: Secondary | ICD-10-CM | POA: Diagnosis not present

## 2021-03-13 DIAGNOSIS — I10 Essential (primary) hypertension: Secondary | ICD-10-CM | POA: Diagnosis not present

## 2021-03-20 DIAGNOSIS — I10 Essential (primary) hypertension: Secondary | ICD-10-CM | POA: Diagnosis not present

## 2021-03-20 DIAGNOSIS — I69354 Hemiplegia and hemiparesis following cerebral infarction affecting left non-dominant side: Secondary | ICD-10-CM | POA: Diagnosis not present

## 2021-03-22 ENCOUNTER — Other Ambulatory Visit: Payer: Self-pay

## 2021-03-22 ENCOUNTER — Other Ambulatory Visit: Payer: Self-pay | Admitting: *Deleted

## 2021-03-22 ENCOUNTER — Encounter: Payer: Medicare Other | Attending: Physical Medicine & Rehabilitation | Admitting: Physical Medicine & Rehabilitation

## 2021-03-22 ENCOUNTER — Encounter: Payer: Self-pay | Admitting: Physical Medicine & Rehabilitation

## 2021-03-22 VITALS — BP 132/71 | HR 59 | Temp 98.3°F | Ht 67.0 in | Wt 140.4 lb

## 2021-03-22 DIAGNOSIS — R269 Unspecified abnormalities of gait and mobility: Secondary | ICD-10-CM | POA: Diagnosis not present

## 2021-03-22 DIAGNOSIS — G8110 Spastic hemiplegia affecting unspecified side: Secondary | ICD-10-CM | POA: Diagnosis not present

## 2021-03-22 DIAGNOSIS — R209 Unspecified disturbances of skin sensation: Secondary | ICD-10-CM

## 2021-03-22 DIAGNOSIS — Z1231 Encounter for screening mammogram for malignant neoplasm of breast: Secondary | ICD-10-CM | POA: Diagnosis not present

## 2021-03-22 DIAGNOSIS — S43002S Unspecified subluxation of left shoulder joint, sequela: Secondary | ICD-10-CM | POA: Diagnosis not present

## 2021-03-22 DIAGNOSIS — I69398 Other sequelae of cerebral infarction: Secondary | ICD-10-CM | POA: Diagnosis not present

## 2021-03-22 DIAGNOSIS — I6300 Cerebral infarction due to thrombosis of unspecified precerebral artery: Secondary | ICD-10-CM

## 2021-03-22 DIAGNOSIS — M792 Neuralgia and neuritis, unspecified: Secondary | ICD-10-CM | POA: Insufficient documentation

## 2021-03-22 MED ORDER — PREGABALIN 50 MG PO CAPS: ORAL_CAPSULE | ORAL | 1 refills | Status: DC

## 2021-03-22 MED ORDER — PREGABALIN 50 MG PO CAPS: ORAL_CAPSULE | ORAL | 6 refills | Status: DC

## 2021-03-22 NOTE — Progress Notes (Signed)
Subjective:    Patient ID: Kelly Lowe, female    DOB: 1950-10-10, 70 y.o.   MRN: [id number]  HPI  Female with history of HTN, right foot fractures presents for follow for right basal ganglia infarct.   Last clinic visit on 12/21/20.  Daughter supplements history. Patient is in [facility] therapies.  She is getting out more.  She was scheduled for injection to her wrist, but denies pain today and opted not to have procedure.   Pain Inventory Average Pain 2 Pain Right Now 2 My pain is dull  In the last 24 hours, has pain interfered with the following? General activity 2 Relation with others 2 Enjoyment of life 2 What TIME of day is your pain at its worst? night Sleep (in general) Fair  Pain is worse with: some activites Pain improves with: rest and heat/ice Relief from Meds: 1   Family History  Problem Relation Age of Onset   Stroke Maternal Grandmother    Stroke Paternal Grandfather    Social History   Socioeconomic History   Marital status: Divorced    Spouse name: Not on file   Number of children: Not on file   Years of education: Not on file   Highest education level: Not on file  Occupational History   Not on file  Tobacco Use   Smoking status: Former    Packs/day: 0.50    Years: 25.00    Pack years: 12.50    Types: Cigarettes    Quit date: 05/02/2017    Years since quitting: 3.8   Smokeless tobacco: Never  Vaping Use   Vaping Use: Never used  Substance and Sexual Activity   Alcohol use: No    Alcohol/week: 1.0 standard drink    Types: 1 Glasses of wine per week    Comment: social   Drug use: No   Sexual activity: Never  Other Topics Concern   Not on file  Social History Narrative   Not on file   Social Determinants of Health   Financial Resource Strain: Not on file  Food Insecurity: Not on file  Transportation Needs: Not on file  Physical Activity: Not on file  Stress: Not on file  Social Connections: Not on file   Past Surgical History:   Procedure Laterality Date   FOOT SURGERY     LOOP RECORDER INSERTION N/A 10/08/2017   Procedure: LOOP RECORDER INSERTION;  Surgeon: Deboraha Sprang, MD;  Location: [place] [facility];  Service: Cardiovascular;  Laterality: N/A;   TEE WITHOUT CARDIOVERSION N/A 05/08/2017   Procedure: TRANSESOPHAGEAL ECHOCARDIOGRAM (TEE);  Surgeon: Lelon Perla, MD;  Location: [facility] ENDOSCOPY;  Service: Cardiovascular;  Laterality: N/A;   Past Medical History:  Diagnosis Date   Hyperlipidemia    Hypertension    Stroke ([place])    BP 132/71   Pulse (!) 59   Temp 98.3 F (36.8 C)   Ht '5\' 7"'$  (1.702 m)   Wt 140 lb 6.4 oz (63.7 kg)   SpO2 97%   BMI 21.99 kg/m   Opioid Risk Score:   Fall Risk Score:  `1  Depression screen PHQ 2/9  Depression screen [facility] 2/9 03/22/2021 11/09/2020 04/13/2020 01/04/2020 12/30/2018 04/09/2018 01/09/2018  Decreased Interest 0 0 3 3 0 0 0  Down, Depressed, Hopeless 0 0 3 3 0 0 0  PHQ - 2 Score 0 0 6 6 0 0 0  Altered sleeping - - - - - - -  Tired, decreased energy - - - - - - -  Change in appetite - - - - - - -  Feeling bad or failure about yourself  - - - - - - -  Trouble concentrating - - - - - - -  Moving slowly or fidgety/restless - - - - - - -  Suicidal thoughts - - - - - - -  PHQ-9 Score - - - - - - -  Difficult doing work/chores - - - - - - -    Review of Systems  Constitutional: Negative.   HENT: Negative.    Eyes: Negative.   Respiratory: Negative.    Cardiovascular: Negative.   Gastrointestinal:  Positive for constipation.       Poor appetite  Endocrine: Negative.   Genitourinary:  Positive for urgency.  Musculoskeletal:  Positive for arthralgias.       Spasms, pain in left shoulder,left hand aching,  Skin:  Positive for rash.  Allergic/Immunologic: Negative.   Neurological:  Positive for weakness and numbness.       Tingling   Hematological: Negative.   Psychiatric/Behavioral:  Positive for confusion and dysphoric mood. The patient is nervous/anxious.   All  other systems reviewed and are negative.    Objective:   Physical Exam  Constitutional: No distress . Vital signs reviewed. HENT: Normocephalic.  Atraumatic. Eyes: EOMI. No discharge. Cardiovascular: No JVD.   Respiratory: Normal effort.  No stridor.   GI: Non-distended.   Skin: Warm and dry.  Intact. Psych: Normal mood.  Normal behavior. Musc:  Left shoulder subluxation, stable Gait: Not assessed today -in wheelchair Neurological: Alert Motor: LUE: 2+/5 shoulder abduction, elbow flex 3+/5, elbow ext 2-/5, hand grip 4-/5 with apraxia LLE: 4-/5 HF, 4/5 KE, ADF/PF 2/5  mAS: left shoulder abductors: 2/4, elbow flexors: 2/4, wrist flexors 3/4, finger flexors 3/4, thumb flexors 1/4 Left ADF/PF 1+/4 Follows commands.  Insight and awareness into deficits.     Assessment & Plan:  Female with history of HTN, right foot fractures presents for follow for right basal ganglia infarct.    1.  Left spastic hemiparesis, fatigue, and functional deficits secondary to right basal ganglia infarct  Continue HH therapies             No benefit with amantadine  Encouraged WHO/PRAFO  Patient would like to hold off on Dysport injections             Previously:               Left Med Pec 100                Left biceps 300              Left FCR 100U               Left FCU 100U              Left FDS 200U              Left FDP 200U                Left Med Gastroc 150              Left Lat Gastroc 150   Continue Baclofen to 10 qhs  Previously educated on limiting sling use, which patient is now doing resulting in decreased tone - cont ROM   2. Gait abnormality  Continue cane for safety   3. Sleep disturbance  Improved overall  4. Mood lability:  Patient, now benefiting with Psychology  Defer Cymbalta management to Psychiatry             [place] ordered by Neurology - but cannot afford  Continue following up with Psychiatry  Major limiting factor - continues to be at  present  Continue to follow up with Psych  ?Improving  5. Left shoulder subluxation with pain             Kinesio taping on hold due to reaction to tape             SPRINT placed on 9/5, removed on 11/6             Tramadol per PCP (educated on signs/symptoms serotonin syndrome)             Ortho consult ordered by Neurology NP - recommended therapies, which she completed   6. Bony prominence right wrist             Xray with bony prominence             Voltaren gel in effective  Good benefits with injection on 9/26, patient elected to indefinitely postpone procedure due to lack of pain today   7. Right hand numbness             Managed at present             Positional             Improved  8. Urinary urgency  Unlikely stroke related  Improving  9. Pseudodementia   See #4  Encouraged brain simulating activities  10. Dysesthesias in tongue  Did not respond to Gabapentin  Continue Lyrica 50 BID with good benefit

## 2021-03-23 NOTE — Progress Notes (Signed)
[facility] Summary Report / Loop Recorder 

## 2021-03-27 DIAGNOSIS — I69354 Hemiplegia and hemiparesis following cerebral infarction affecting left non-dominant side: Secondary | ICD-10-CM | POA: Diagnosis not present

## 2021-03-27 DIAGNOSIS — I10 Essential (primary) hypertension: Secondary | ICD-10-CM | POA: Diagnosis not present

## 2021-04-02 ENCOUNTER — Ambulatory Visit (INDEPENDENT_AMBULATORY_CARE_PROVIDER_SITE_OTHER): Payer: Medicare Other

## 2021-04-02 DIAGNOSIS — I6300 Cerebral infarction due to thrombosis of unspecified precerebral artery: Secondary | ICD-10-CM

## 2021-04-03 LAB — CUP PACEART REMOTE DEVICE CHECK
Date Time Interrogation Session: 20220822031626
Implantable Pulse Generator Implant Date: 20190306

## 2021-04-05 DIAGNOSIS — I10 Essential (primary) hypertension: Secondary | ICD-10-CM | POA: Diagnosis not present

## 2021-04-05 DIAGNOSIS — I69354 Hemiplegia and hemiparesis following cerebral infarction affecting left non-dominant side: Secondary | ICD-10-CM | POA: Diagnosis not present

## 2021-04-16 NOTE — Progress Notes (Signed)
[facility] Summary Report / Loop Recorder 

## 2021-04-17 DIAGNOSIS — F339 Major depressive disorder, recurrent, unspecified: Secondary | ICD-10-CM | POA: Diagnosis not present

## 2021-04-18 DIAGNOSIS — Z20822 Contact with and (suspected) exposure to covid-19: Secondary | ICD-10-CM | POA: Diagnosis not present

## 2021-04-18 DIAGNOSIS — Z23 Encounter for immunization: Secondary | ICD-10-CM | POA: Diagnosis not present

## 2021-04-25 ENCOUNTER — Other Ambulatory Visit: Payer: Self-pay | Admitting: Physical Medicine & Rehabilitation

## 2021-04-26 ENCOUNTER — Ambulatory Visit (INDEPENDENT_AMBULATORY_CARE_PROVIDER_SITE_OTHER): Payer: Medicare Other

## 2021-04-26 DIAGNOSIS — I6300 Cerebral infarction due to thrombosis of unspecified precerebral artery: Secondary | ICD-10-CM

## 2021-04-26 LAB — CUP PACEART REMOTE DEVICE CHECK
Date Time Interrogation Session: 20220922031331
Implantable Pulse Generator Implant Date: 20190306

## 2021-04-30 ENCOUNTER — Telehealth: Payer: Self-pay

## 2021-04-30 NOTE — Telephone Encounter (Signed)
ILR alert report received. Battery status shows RRT. Sent to triage.  Normal device function. No new symptom, tachy, brady, or pause episodes. No new AF episodes.   Attempted to contact patient to advise ILR @ RRT. No answer, LMTCB.

## 2021-05-01 NOTE — Telephone Encounter (Signed)
2nd attempt to contact patient. No answer, LMTCB. 

## 2021-05-03 NOTE — Progress Notes (Signed)
[facility] Summary Report / Loop Recorder 

## 2021-05-11 ENCOUNTER — Telehealth: Payer: Self-pay

## 2021-05-11 NOTE — Telephone Encounter (Signed)
Unsuccessful telephone encounter to patient's daughter Kingsley Spittle (on DPR) to discuss Loop RRT status. [employer] compliant VM message left requesting call back to [phone].

## 2021-05-11 NOTE — Telephone Encounter (Signed)
Pt daughter left a message on voicemail for nurse to return her phone call.

## 2021-05-14 NOTE — Telephone Encounter (Signed)
Returning phone call to Kelly Lowe who is on [place]. Disucssed options to leave device in or have explanted. States she will speak to her mother (patient) and call back today or tomorrow.

## 2021-05-15 NOTE — Telephone Encounter (Signed)
Follow up phone call made to daughter [name] about implant or explant.   States her mother would like to leave device in and appreciative of follow up call. Advised if we can help her to please feel free to reach out. Appreciative of follow up call.

## 2021-05-21 DIAGNOSIS — B351 Tinea unguium: Secondary | ICD-10-CM | POA: Diagnosis not present

## 2021-05-21 DIAGNOSIS — M21372 Foot drop, left foot: Secondary | ICD-10-CM | POA: Diagnosis not present

## 2021-05-21 DIAGNOSIS — M79672 Pain in left foot: Secondary | ICD-10-CM | POA: Diagnosis not present

## 2021-05-21 DIAGNOSIS — R2689 Other abnormalities of gait and mobility: Secondary | ICD-10-CM | POA: Diagnosis not present

## 2021-05-21 DIAGNOSIS — G608 Other hereditary and idiopathic neuropathies: Secondary | ICD-10-CM | POA: Diagnosis not present

## 2021-05-21 DIAGNOSIS — D2372 Other benign neoplasm of skin of left lower limb, including hip: Secondary | ICD-10-CM | POA: Diagnosis not present

## 2021-05-21 DIAGNOSIS — M24572 Contracture, left ankle: Secondary | ICD-10-CM | POA: Diagnosis not present

## 2021-06-11 DIAGNOSIS — F339 Major depressive disorder, recurrent, unspecified: Secondary | ICD-10-CM | POA: Diagnosis not present

## 2021-07-10 DIAGNOSIS — F3341 Major depressive disorder, recurrent, in partial remission: Secondary | ICD-10-CM | POA: Diagnosis not present

## 2021-07-10 DIAGNOSIS — M810 Age-related osteoporosis without current pathological fracture: Secondary | ICD-10-CM | POA: Diagnosis not present

## 2021-07-10 DIAGNOSIS — E785 Hyperlipidemia, unspecified: Secondary | ICD-10-CM | POA: Diagnosis not present

## 2021-07-10 DIAGNOSIS — G4700 Insomnia, unspecified: Secondary | ICD-10-CM | POA: Diagnosis not present

## 2021-07-10 DIAGNOSIS — Z23 Encounter for immunization: Secondary | ICD-10-CM | POA: Diagnosis not present

## 2021-07-10 DIAGNOSIS — I679 Cerebrovascular disease, unspecified: Secondary | ICD-10-CM | POA: Diagnosis not present

## 2021-07-10 DIAGNOSIS — I10 Essential (primary) hypertension: Secondary | ICD-10-CM | POA: Diagnosis not present

## 2021-07-14 DIAGNOSIS — G629 Polyneuropathy, unspecified: Secondary | ICD-10-CM | POA: Diagnosis not present

## 2021-07-14 DIAGNOSIS — K5790 Diverticulosis of intestine, part unspecified, without perforation or abscess without bleeding: Secondary | ICD-10-CM | POA: Diagnosis not present

## 2021-07-14 DIAGNOSIS — G4700 Insomnia, unspecified: Secondary | ICD-10-CM | POA: Diagnosis not present

## 2021-07-14 DIAGNOSIS — Z9181 History of falling: Secondary | ICD-10-CM | POA: Diagnosis not present

## 2021-07-14 DIAGNOSIS — I69354 Hemiplegia and hemiparesis following cerebral infarction affecting left non-dominant side: Secondary | ICD-10-CM | POA: Diagnosis not present

## 2021-07-14 DIAGNOSIS — F3341 Major depressive disorder, recurrent, in partial remission: Secondary | ICD-10-CM | POA: Diagnosis not present

## 2021-07-14 DIAGNOSIS — R1311 Dysphagia, oral phase: Secondary | ICD-10-CM | POA: Diagnosis not present

## 2021-07-14 DIAGNOSIS — I10 Essential (primary) hypertension: Secondary | ICD-10-CM | POA: Diagnosis not present

## 2021-07-14 DIAGNOSIS — Z87891 Personal history of nicotine dependence: Secondary | ICD-10-CM | POA: Diagnosis not present

## 2021-07-14 DIAGNOSIS — Z7982 Long term (current) use of aspirin: Secondary | ICD-10-CM | POA: Diagnosis not present

## 2021-07-14 DIAGNOSIS — E785 Hyperlipidemia, unspecified: Secondary | ICD-10-CM | POA: Diagnosis not present

## 2021-07-14 DIAGNOSIS — M810 Age-related osteoporosis without current pathological fracture: Secondary | ICD-10-CM | POA: Diagnosis not present

## 2021-07-14 DIAGNOSIS — M8580 Other specified disorders of bone density and structure, unspecified site: Secondary | ICD-10-CM | POA: Diagnosis not present

## 2021-07-18 DIAGNOSIS — I10 Essential (primary) hypertension: Secondary | ICD-10-CM | POA: Diagnosis not present

## 2021-07-18 DIAGNOSIS — K5790 Diverticulosis of intestine, part unspecified, without perforation or abscess without bleeding: Secondary | ICD-10-CM | POA: Diagnosis not present

## 2021-07-18 DIAGNOSIS — M810 Age-related osteoporosis without current pathological fracture: Secondary | ICD-10-CM | POA: Diagnosis not present

## 2021-07-18 DIAGNOSIS — G629 Polyneuropathy, unspecified: Secondary | ICD-10-CM | POA: Diagnosis not present

## 2021-07-18 DIAGNOSIS — I69354 Hemiplegia and hemiparesis following cerebral infarction affecting left non-dominant side: Secondary | ICD-10-CM | POA: Diagnosis not present

## 2021-07-18 DIAGNOSIS — M8580 Other specified disorders of bone density and structure, unspecified site: Secondary | ICD-10-CM | POA: Diagnosis not present

## 2021-07-19 DIAGNOSIS — M8580 Other specified disorders of bone density and structure, unspecified site: Secondary | ICD-10-CM | POA: Diagnosis not present

## 2021-07-19 DIAGNOSIS — G629 Polyneuropathy, unspecified: Secondary | ICD-10-CM | POA: Diagnosis not present

## 2021-07-19 DIAGNOSIS — I69354 Hemiplegia and hemiparesis following cerebral infarction affecting left non-dominant side: Secondary | ICD-10-CM | POA: Diagnosis not present

## 2021-07-19 DIAGNOSIS — I10 Essential (primary) hypertension: Secondary | ICD-10-CM | POA: Diagnosis not present

## 2021-07-19 DIAGNOSIS — M810 Age-related osteoporosis without current pathological fracture: Secondary | ICD-10-CM | POA: Diagnosis not present

## 2021-07-19 DIAGNOSIS — K5790 Diverticulosis of intestine, part unspecified, without perforation or abscess without bleeding: Secondary | ICD-10-CM | POA: Diagnosis not present

## 2021-07-23 DIAGNOSIS — G629 Polyneuropathy, unspecified: Secondary | ICD-10-CM | POA: Diagnosis not present

## 2021-07-23 DIAGNOSIS — I69354 Hemiplegia and hemiparesis following cerebral infarction affecting left non-dominant side: Secondary | ICD-10-CM | POA: Diagnosis not present

## 2021-07-23 DIAGNOSIS — K5790 Diverticulosis of intestine, part unspecified, without perforation or abscess without bleeding: Secondary | ICD-10-CM | POA: Diagnosis not present

## 2021-07-23 DIAGNOSIS — I10 Essential (primary) hypertension: Secondary | ICD-10-CM | POA: Diagnosis not present

## 2021-07-23 DIAGNOSIS — M8580 Other specified disorders of bone density and structure, unspecified site: Secondary | ICD-10-CM | POA: Diagnosis not present

## 2021-07-23 DIAGNOSIS — M810 Age-related osteoporosis without current pathological fracture: Secondary | ICD-10-CM | POA: Diagnosis not present

## 2021-07-24 DIAGNOSIS — K5790 Diverticulosis of intestine, part unspecified, without perforation or abscess without bleeding: Secondary | ICD-10-CM | POA: Diagnosis not present

## 2021-07-24 DIAGNOSIS — I10 Essential (primary) hypertension: Secondary | ICD-10-CM | POA: Diagnosis not present

## 2021-07-24 DIAGNOSIS — M8580 Other specified disorders of bone density and structure, unspecified site: Secondary | ICD-10-CM | POA: Diagnosis not present

## 2021-07-24 DIAGNOSIS — I69354 Hemiplegia and hemiparesis following cerebral infarction affecting left non-dominant side: Secondary | ICD-10-CM | POA: Diagnosis not present

## 2021-07-24 DIAGNOSIS — G629 Polyneuropathy, unspecified: Secondary | ICD-10-CM | POA: Diagnosis not present

## 2021-07-24 DIAGNOSIS — M810 Age-related osteoporosis without current pathological fracture: Secondary | ICD-10-CM | POA: Diagnosis not present

## 2021-07-25 DIAGNOSIS — I10 Essential (primary) hypertension: Secondary | ICD-10-CM | POA: Diagnosis not present

## 2021-07-25 DIAGNOSIS — I69354 Hemiplegia and hemiparesis following cerebral infarction affecting left non-dominant side: Secondary | ICD-10-CM | POA: Diagnosis not present

## 2021-07-25 DIAGNOSIS — G629 Polyneuropathy, unspecified: Secondary | ICD-10-CM | POA: Diagnosis not present

## 2021-07-25 DIAGNOSIS — M8580 Other specified disorders of bone density and structure, unspecified site: Secondary | ICD-10-CM | POA: Diagnosis not present

## 2021-07-25 DIAGNOSIS — M810 Age-related osteoporosis without current pathological fracture: Secondary | ICD-10-CM | POA: Diagnosis not present

## 2021-07-25 DIAGNOSIS — K5790 Diverticulosis of intestine, part unspecified, without perforation or abscess without bleeding: Secondary | ICD-10-CM | POA: Diagnosis not present

## 2021-07-26 DIAGNOSIS — I10 Essential (primary) hypertension: Secondary | ICD-10-CM | POA: Diagnosis not present

## 2021-07-26 DIAGNOSIS — M8580 Other specified disorders of bone density and structure, unspecified site: Secondary | ICD-10-CM | POA: Diagnosis not present

## 2021-07-26 DIAGNOSIS — I69354 Hemiplegia and hemiparesis following cerebral infarction affecting left non-dominant side: Secondary | ICD-10-CM | POA: Diagnosis not present

## 2021-07-26 DIAGNOSIS — K5790 Diverticulosis of intestine, part unspecified, without perforation or abscess without bleeding: Secondary | ICD-10-CM | POA: Diagnosis not present

## 2021-07-26 DIAGNOSIS — G629 Polyneuropathy, unspecified: Secondary | ICD-10-CM | POA: Diagnosis not present

## 2021-07-26 DIAGNOSIS — M810 Age-related osteoporosis without current pathological fracture: Secondary | ICD-10-CM | POA: Diagnosis not present

## 2021-07-27 DIAGNOSIS — M810 Age-related osteoporosis without current pathological fracture: Secondary | ICD-10-CM | POA: Diagnosis not present

## 2021-07-27 DIAGNOSIS — G629 Polyneuropathy, unspecified: Secondary | ICD-10-CM | POA: Diagnosis not present

## 2021-07-27 DIAGNOSIS — I10 Essential (primary) hypertension: Secondary | ICD-10-CM | POA: Diagnosis not present

## 2021-07-27 DIAGNOSIS — M8580 Other specified disorders of bone density and structure, unspecified site: Secondary | ICD-10-CM | POA: Diagnosis not present

## 2021-07-27 DIAGNOSIS — I69354 Hemiplegia and hemiparesis following cerebral infarction affecting left non-dominant side: Secondary | ICD-10-CM | POA: Diagnosis not present

## 2021-07-27 DIAGNOSIS — K5790 Diverticulosis of intestine, part unspecified, without perforation or abscess without bleeding: Secondary | ICD-10-CM | POA: Diagnosis not present

## 2021-08-01 DIAGNOSIS — G629 Polyneuropathy, unspecified: Secondary | ICD-10-CM | POA: Diagnosis not present

## 2021-08-01 DIAGNOSIS — K5790 Diverticulosis of intestine, part unspecified, without perforation or abscess without bleeding: Secondary | ICD-10-CM | POA: Diagnosis not present

## 2021-08-01 DIAGNOSIS — I10 Essential (primary) hypertension: Secondary | ICD-10-CM | POA: Diagnosis not present

## 2021-08-01 DIAGNOSIS — I69354 Hemiplegia and hemiparesis following cerebral infarction affecting left non-dominant side: Secondary | ICD-10-CM | POA: Diagnosis not present

## 2021-08-01 DIAGNOSIS — M8580 Other specified disorders of bone density and structure, unspecified site: Secondary | ICD-10-CM | POA: Diagnosis not present

## 2021-08-01 DIAGNOSIS — M810 Age-related osteoporosis without current pathological fracture: Secondary | ICD-10-CM | POA: Diagnosis not present

## 2021-08-02 DIAGNOSIS — M8580 Other specified disorders of bone density and structure, unspecified site: Secondary | ICD-10-CM | POA: Diagnosis not present

## 2021-08-02 DIAGNOSIS — K5790 Diverticulosis of intestine, part unspecified, without perforation or abscess without bleeding: Secondary | ICD-10-CM | POA: Diagnosis not present

## 2021-08-02 DIAGNOSIS — I69354 Hemiplegia and hemiparesis following cerebral infarction affecting left non-dominant side: Secondary | ICD-10-CM | POA: Diagnosis not present

## 2021-08-02 DIAGNOSIS — G629 Polyneuropathy, unspecified: Secondary | ICD-10-CM | POA: Diagnosis not present

## 2021-08-02 DIAGNOSIS — I10 Essential (primary) hypertension: Secondary | ICD-10-CM | POA: Diagnosis not present

## 2021-08-02 DIAGNOSIS — M810 Age-related osteoporosis without current pathological fracture: Secondary | ICD-10-CM | POA: Diagnosis not present

## 2021-08-03 DIAGNOSIS — I69354 Hemiplegia and hemiparesis following cerebral infarction affecting left non-dominant side: Secondary | ICD-10-CM | POA: Diagnosis not present

## 2021-08-03 DIAGNOSIS — I10 Essential (primary) hypertension: Secondary | ICD-10-CM | POA: Diagnosis not present

## 2021-08-03 DIAGNOSIS — G629 Polyneuropathy, unspecified: Secondary | ICD-10-CM | POA: Diagnosis not present

## 2021-08-03 DIAGNOSIS — M8580 Other specified disorders of bone density and structure, unspecified site: Secondary | ICD-10-CM | POA: Diagnosis not present

## 2021-08-03 DIAGNOSIS — M810 Age-related osteoporosis without current pathological fracture: Secondary | ICD-10-CM | POA: Diagnosis not present

## 2021-08-03 DIAGNOSIS — K5790 Diverticulosis of intestine, part unspecified, without perforation or abscess without bleeding: Secondary | ICD-10-CM | POA: Diagnosis not present

## 2021-08-07 DIAGNOSIS — M8580 Other specified disorders of bone density and structure, unspecified site: Secondary | ICD-10-CM | POA: Diagnosis not present

## 2021-08-07 DIAGNOSIS — I10 Essential (primary) hypertension: Secondary | ICD-10-CM | POA: Diagnosis not present

## 2021-08-07 DIAGNOSIS — M810 Age-related osteoporosis without current pathological fracture: Secondary | ICD-10-CM | POA: Diagnosis not present

## 2021-08-07 DIAGNOSIS — I69354 Hemiplegia and hemiparesis following cerebral infarction affecting left non-dominant side: Secondary | ICD-10-CM | POA: Diagnosis not present

## 2021-08-07 DIAGNOSIS — K5790 Diverticulosis of intestine, part unspecified, without perforation or abscess without bleeding: Secondary | ICD-10-CM | POA: Diagnosis not present

## 2021-08-07 DIAGNOSIS — F339 Major depressive disorder, recurrent, unspecified: Secondary | ICD-10-CM | POA: Diagnosis not present

## 2021-08-07 DIAGNOSIS — G629 Polyneuropathy, unspecified: Secondary | ICD-10-CM | POA: Diagnosis not present

## 2021-08-08 DIAGNOSIS — K5790 Diverticulosis of intestine, part unspecified, without perforation or abscess without bleeding: Secondary | ICD-10-CM | POA: Diagnosis not present

## 2021-08-08 DIAGNOSIS — I69354 Hemiplegia and hemiparesis following cerebral infarction affecting left non-dominant side: Secondary | ICD-10-CM | POA: Diagnosis not present

## 2021-08-08 DIAGNOSIS — I10 Essential (primary) hypertension: Secondary | ICD-10-CM | POA: Diagnosis not present

## 2021-08-08 DIAGNOSIS — M8580 Other specified disorders of bone density and structure, unspecified site: Secondary | ICD-10-CM | POA: Diagnosis not present

## 2021-08-08 DIAGNOSIS — M810 Age-related osteoporosis without current pathological fracture: Secondary | ICD-10-CM | POA: Diagnosis not present

## 2021-08-08 DIAGNOSIS — G629 Polyneuropathy, unspecified: Secondary | ICD-10-CM | POA: Diagnosis not present

## 2021-08-10 DIAGNOSIS — M8580 Other specified disorders of bone density and structure, unspecified site: Secondary | ICD-10-CM | POA: Diagnosis not present

## 2021-08-10 DIAGNOSIS — K5790 Diverticulosis of intestine, part unspecified, without perforation or abscess without bleeding: Secondary | ICD-10-CM | POA: Diagnosis not present

## 2021-08-10 DIAGNOSIS — G4700 Insomnia, unspecified: Secondary | ICD-10-CM | POA: Diagnosis not present

## 2021-08-10 DIAGNOSIS — G629 Polyneuropathy, unspecified: Secondary | ICD-10-CM | POA: Diagnosis not present

## 2021-08-10 DIAGNOSIS — F3341 Major depressive disorder, recurrent, in partial remission: Secondary | ICD-10-CM | POA: Diagnosis not present

## 2021-08-10 DIAGNOSIS — M810 Age-related osteoporosis without current pathological fracture: Secondary | ICD-10-CM | POA: Diagnosis not present

## 2021-08-10 DIAGNOSIS — I10 Essential (primary) hypertension: Secondary | ICD-10-CM | POA: Diagnosis not present

## 2021-08-10 DIAGNOSIS — I69354 Hemiplegia and hemiparesis following cerebral infarction affecting left non-dominant side: Secondary | ICD-10-CM | POA: Diagnosis not present

## 2021-08-10 DIAGNOSIS — E785 Hyperlipidemia, unspecified: Secondary | ICD-10-CM | POA: Diagnosis not present

## 2021-08-13 DIAGNOSIS — K5790 Diverticulosis of intestine, part unspecified, without perforation or abscess without bleeding: Secondary | ICD-10-CM | POA: Diagnosis not present

## 2021-08-13 DIAGNOSIS — F3341 Major depressive disorder, recurrent, in partial remission: Secondary | ICD-10-CM | POA: Diagnosis not present

## 2021-08-13 DIAGNOSIS — Z87891 Personal history of nicotine dependence: Secondary | ICD-10-CM | POA: Diagnosis not present

## 2021-08-13 DIAGNOSIS — I69354 Hemiplegia and hemiparesis following cerebral infarction affecting left non-dominant side: Secondary | ICD-10-CM | POA: Diagnosis not present

## 2021-08-13 DIAGNOSIS — G629 Polyneuropathy, unspecified: Secondary | ICD-10-CM | POA: Diagnosis not present

## 2021-08-13 DIAGNOSIS — M810 Age-related osteoporosis without current pathological fracture: Secondary | ICD-10-CM | POA: Diagnosis not present

## 2021-08-13 DIAGNOSIS — I10 Essential (primary) hypertension: Secondary | ICD-10-CM | POA: Diagnosis not present

## 2021-08-13 DIAGNOSIS — G4700 Insomnia, unspecified: Secondary | ICD-10-CM | POA: Diagnosis not present

## 2021-08-13 DIAGNOSIS — Z7982 Long term (current) use of aspirin: Secondary | ICD-10-CM | POA: Diagnosis not present

## 2021-08-13 DIAGNOSIS — Z9181 History of falling: Secondary | ICD-10-CM | POA: Diagnosis not present

## 2021-08-13 DIAGNOSIS — E785 Hyperlipidemia, unspecified: Secondary | ICD-10-CM | POA: Diagnosis not present

## 2021-08-13 DIAGNOSIS — M8580 Other specified disorders of bone density and structure, unspecified site: Secondary | ICD-10-CM | POA: Diagnosis not present

## 2021-08-13 DIAGNOSIS — R1311 Dysphagia, oral phase: Secondary | ICD-10-CM | POA: Diagnosis not present

## 2021-08-14 DIAGNOSIS — U071 COVID-19: Secondary | ICD-10-CM | POA: Diagnosis not present

## 2021-08-20 DIAGNOSIS — G629 Polyneuropathy, unspecified: Secondary | ICD-10-CM | POA: Diagnosis not present

## 2021-08-20 DIAGNOSIS — K5790 Diverticulosis of intestine, part unspecified, without perforation or abscess without bleeding: Secondary | ICD-10-CM | POA: Diagnosis not present

## 2021-08-20 DIAGNOSIS — M8580 Other specified disorders of bone density and structure, unspecified site: Secondary | ICD-10-CM | POA: Diagnosis not present

## 2021-08-20 DIAGNOSIS — I69354 Hemiplegia and hemiparesis following cerebral infarction affecting left non-dominant side: Secondary | ICD-10-CM | POA: Diagnosis not present

## 2021-08-20 DIAGNOSIS — I10 Essential (primary) hypertension: Secondary | ICD-10-CM | POA: Diagnosis not present

## 2021-08-20 DIAGNOSIS — M810 Age-related osteoporosis without current pathological fracture: Secondary | ICD-10-CM | POA: Diagnosis not present

## 2021-08-21 DIAGNOSIS — G629 Polyneuropathy, unspecified: Secondary | ICD-10-CM | POA: Diagnosis not present

## 2021-08-21 DIAGNOSIS — I10 Essential (primary) hypertension: Secondary | ICD-10-CM | POA: Diagnosis not present

## 2021-08-21 DIAGNOSIS — I69354 Hemiplegia and hemiparesis following cerebral infarction affecting left non-dominant side: Secondary | ICD-10-CM | POA: Diagnosis not present

## 2021-08-21 DIAGNOSIS — M8580 Other specified disorders of bone density and structure, unspecified site: Secondary | ICD-10-CM | POA: Diagnosis not present

## 2021-08-21 DIAGNOSIS — K5790 Diverticulosis of intestine, part unspecified, without perforation or abscess without bleeding: Secondary | ICD-10-CM | POA: Diagnosis not present

## 2021-08-21 DIAGNOSIS — M810 Age-related osteoporosis without current pathological fracture: Secondary | ICD-10-CM | POA: Diagnosis not present

## 2021-08-22 DIAGNOSIS — G629 Polyneuropathy, unspecified: Secondary | ICD-10-CM | POA: Diagnosis not present

## 2021-08-22 DIAGNOSIS — K5790 Diverticulosis of intestine, part unspecified, without perforation or abscess without bleeding: Secondary | ICD-10-CM | POA: Diagnosis not present

## 2021-08-22 DIAGNOSIS — I69354 Hemiplegia and hemiparesis following cerebral infarction affecting left non-dominant side: Secondary | ICD-10-CM | POA: Diagnosis not present

## 2021-08-22 DIAGNOSIS — I10 Essential (primary) hypertension: Secondary | ICD-10-CM | POA: Diagnosis not present

## 2021-08-22 DIAGNOSIS — M8580 Other specified disorders of bone density and structure, unspecified site: Secondary | ICD-10-CM | POA: Diagnosis not present

## 2021-08-22 DIAGNOSIS — M810 Age-related osteoporosis without current pathological fracture: Secondary | ICD-10-CM | POA: Diagnosis not present

## 2021-08-23 DIAGNOSIS — M8580 Other specified disorders of bone density and structure, unspecified site: Secondary | ICD-10-CM | POA: Diagnosis not present

## 2021-08-23 DIAGNOSIS — G629 Polyneuropathy, unspecified: Secondary | ICD-10-CM | POA: Diagnosis not present

## 2021-08-23 DIAGNOSIS — I10 Essential (primary) hypertension: Secondary | ICD-10-CM | POA: Diagnosis not present

## 2021-08-23 DIAGNOSIS — K5790 Diverticulosis of intestine, part unspecified, without perforation or abscess without bleeding: Secondary | ICD-10-CM | POA: Diagnosis not present

## 2021-08-23 DIAGNOSIS — M810 Age-related osteoporosis without current pathological fracture: Secondary | ICD-10-CM | POA: Diagnosis not present

## 2021-08-23 DIAGNOSIS — I69354 Hemiplegia and hemiparesis following cerebral infarction affecting left non-dominant side: Secondary | ICD-10-CM | POA: Diagnosis not present

## 2021-08-26 DIAGNOSIS — I10 Essential (primary) hypertension: Secondary | ICD-10-CM | POA: Diagnosis not present

## 2021-08-26 DIAGNOSIS — G629 Polyneuropathy, unspecified: Secondary | ICD-10-CM | POA: Diagnosis not present

## 2021-08-26 DIAGNOSIS — K5790 Diverticulosis of intestine, part unspecified, without perforation or abscess without bleeding: Secondary | ICD-10-CM | POA: Diagnosis not present

## 2021-08-26 DIAGNOSIS — M8580 Other specified disorders of bone density and structure, unspecified site: Secondary | ICD-10-CM | POA: Diagnosis not present

## 2021-08-26 DIAGNOSIS — M810 Age-related osteoporosis without current pathological fracture: Secondary | ICD-10-CM | POA: Diagnosis not present

## 2021-08-26 DIAGNOSIS — I69354 Hemiplegia and hemiparesis following cerebral infarction affecting left non-dominant side: Secondary | ICD-10-CM | POA: Diagnosis not present

## 2021-08-27 DIAGNOSIS — I10 Essential (primary) hypertension: Secondary | ICD-10-CM | POA: Diagnosis not present

## 2021-08-27 DIAGNOSIS — M8580 Other specified disorders of bone density and structure, unspecified site: Secondary | ICD-10-CM | POA: Diagnosis not present

## 2021-08-27 DIAGNOSIS — I69354 Hemiplegia and hemiparesis following cerebral infarction affecting left non-dominant side: Secondary | ICD-10-CM | POA: Diagnosis not present

## 2021-08-27 DIAGNOSIS — M810 Age-related osteoporosis without current pathological fracture: Secondary | ICD-10-CM | POA: Diagnosis not present

## 2021-08-27 DIAGNOSIS — K5790 Diverticulosis of intestine, part unspecified, without perforation or abscess without bleeding: Secondary | ICD-10-CM | POA: Diagnosis not present

## 2021-08-27 DIAGNOSIS — G629 Polyneuropathy, unspecified: Secondary | ICD-10-CM | POA: Diagnosis not present

## 2021-08-28 DIAGNOSIS — I69354 Hemiplegia and hemiparesis following cerebral infarction affecting left non-dominant side: Secondary | ICD-10-CM | POA: Diagnosis not present

## 2021-08-28 DIAGNOSIS — G629 Polyneuropathy, unspecified: Secondary | ICD-10-CM | POA: Diagnosis not present

## 2021-08-28 DIAGNOSIS — M810 Age-related osteoporosis without current pathological fracture: Secondary | ICD-10-CM | POA: Diagnosis not present

## 2021-08-28 DIAGNOSIS — M8580 Other specified disorders of bone density and structure, unspecified site: Secondary | ICD-10-CM | POA: Diagnosis not present

## 2021-08-28 DIAGNOSIS — I10 Essential (primary) hypertension: Secondary | ICD-10-CM | POA: Diagnosis not present

## 2021-08-28 DIAGNOSIS — K5790 Diverticulosis of intestine, part unspecified, without perforation or abscess without bleeding: Secondary | ICD-10-CM | POA: Diagnosis not present

## 2021-08-29 DIAGNOSIS — G629 Polyneuropathy, unspecified: Secondary | ICD-10-CM | POA: Diagnosis not present

## 2021-08-29 DIAGNOSIS — I69354 Hemiplegia and hemiparesis following cerebral infarction affecting left non-dominant side: Secondary | ICD-10-CM | POA: Diagnosis not present

## 2021-08-29 DIAGNOSIS — K5790 Diverticulosis of intestine, part unspecified, without perforation or abscess without bleeding: Secondary | ICD-10-CM | POA: Diagnosis not present

## 2021-08-29 DIAGNOSIS — M810 Age-related osteoporosis without current pathological fracture: Secondary | ICD-10-CM | POA: Diagnosis not present

## 2021-08-29 DIAGNOSIS — M8580 Other specified disorders of bone density and structure, unspecified site: Secondary | ICD-10-CM | POA: Diagnosis not present

## 2021-08-29 DIAGNOSIS — I10 Essential (primary) hypertension: Secondary | ICD-10-CM | POA: Diagnosis not present

## 2021-08-30 DIAGNOSIS — K5790 Diverticulosis of intestine, part unspecified, without perforation or abscess without bleeding: Secondary | ICD-10-CM | POA: Diagnosis not present

## 2021-08-30 DIAGNOSIS — I69354 Hemiplegia and hemiparesis following cerebral infarction affecting left non-dominant side: Secondary | ICD-10-CM | POA: Diagnosis not present

## 2021-08-30 DIAGNOSIS — G629 Polyneuropathy, unspecified: Secondary | ICD-10-CM | POA: Diagnosis not present

## 2021-08-30 DIAGNOSIS — I10 Essential (primary) hypertension: Secondary | ICD-10-CM | POA: Diagnosis not present

## 2021-08-30 DIAGNOSIS — M8580 Other specified disorders of bone density and structure, unspecified site: Secondary | ICD-10-CM | POA: Diagnosis not present

## 2021-08-30 DIAGNOSIS — M810 Age-related osteoporosis without current pathological fracture: Secondary | ICD-10-CM | POA: Diagnosis not present

## 2021-09-03 DIAGNOSIS — G629 Polyneuropathy, unspecified: Secondary | ICD-10-CM | POA: Diagnosis not present

## 2021-09-03 DIAGNOSIS — I10 Essential (primary) hypertension: Secondary | ICD-10-CM | POA: Diagnosis not present

## 2021-09-03 DIAGNOSIS — M8580 Other specified disorders of bone density and structure, unspecified site: Secondary | ICD-10-CM | POA: Diagnosis not present

## 2021-09-03 DIAGNOSIS — K5790 Diverticulosis of intestine, part unspecified, without perforation or abscess without bleeding: Secondary | ICD-10-CM | POA: Diagnosis not present

## 2021-09-03 DIAGNOSIS — I69354 Hemiplegia and hemiparesis following cerebral infarction affecting left non-dominant side: Secondary | ICD-10-CM | POA: Diagnosis not present

## 2021-09-03 DIAGNOSIS — M810 Age-related osteoporosis without current pathological fracture: Secondary | ICD-10-CM | POA: Diagnosis not present

## 2021-09-05 DIAGNOSIS — I10 Essential (primary) hypertension: Secondary | ICD-10-CM | POA: Diagnosis not present

## 2021-09-05 DIAGNOSIS — M810 Age-related osteoporosis without current pathological fracture: Secondary | ICD-10-CM | POA: Diagnosis not present

## 2021-09-05 DIAGNOSIS — M8580 Other specified disorders of bone density and structure, unspecified site: Secondary | ICD-10-CM | POA: Diagnosis not present

## 2021-09-05 DIAGNOSIS — I69354 Hemiplegia and hemiparesis following cerebral infarction affecting left non-dominant side: Secondary | ICD-10-CM | POA: Diagnosis not present

## 2021-09-05 DIAGNOSIS — K5790 Diverticulosis of intestine, part unspecified, without perforation or abscess without bleeding: Secondary | ICD-10-CM | POA: Diagnosis not present

## 2021-09-05 DIAGNOSIS — G629 Polyneuropathy, unspecified: Secondary | ICD-10-CM | POA: Diagnosis not present

## 2021-09-06 DIAGNOSIS — M810 Age-related osteoporosis without current pathological fracture: Secondary | ICD-10-CM | POA: Diagnosis not present

## 2021-09-06 DIAGNOSIS — M8580 Other specified disorders of bone density and structure, unspecified site: Secondary | ICD-10-CM | POA: Diagnosis not present

## 2021-09-06 DIAGNOSIS — I10 Essential (primary) hypertension: Secondary | ICD-10-CM | POA: Diagnosis not present

## 2021-09-06 DIAGNOSIS — G629 Polyneuropathy, unspecified: Secondary | ICD-10-CM | POA: Diagnosis not present

## 2021-09-06 DIAGNOSIS — I69354 Hemiplegia and hemiparesis following cerebral infarction affecting left non-dominant side: Secondary | ICD-10-CM | POA: Diagnosis not present

## 2021-09-06 DIAGNOSIS — K5790 Diverticulosis of intestine, part unspecified, without perforation or abscess without bleeding: Secondary | ICD-10-CM | POA: Diagnosis not present

## 2021-09-09 DIAGNOSIS — G629 Polyneuropathy, unspecified: Secondary | ICD-10-CM | POA: Diagnosis not present

## 2021-09-09 DIAGNOSIS — I10 Essential (primary) hypertension: Secondary | ICD-10-CM | POA: Diagnosis not present

## 2021-09-09 DIAGNOSIS — M8580 Other specified disorders of bone density and structure, unspecified site: Secondary | ICD-10-CM | POA: Diagnosis not present

## 2021-09-09 DIAGNOSIS — I69354 Hemiplegia and hemiparesis following cerebral infarction affecting left non-dominant side: Secondary | ICD-10-CM | POA: Diagnosis not present

## 2021-09-09 DIAGNOSIS — M810 Age-related osteoporosis without current pathological fracture: Secondary | ICD-10-CM | POA: Diagnosis not present

## 2021-09-09 DIAGNOSIS — K5790 Diverticulosis of intestine, part unspecified, without perforation or abscess without bleeding: Secondary | ICD-10-CM | POA: Diagnosis not present

## 2021-09-12 DIAGNOSIS — E785 Hyperlipidemia, unspecified: Secondary | ICD-10-CM | POA: Diagnosis not present

## 2021-09-12 DIAGNOSIS — I10 Essential (primary) hypertension: Secondary | ICD-10-CM | POA: Diagnosis not present

## 2021-09-26 ENCOUNTER — Ambulatory Visit: Payer: Medicare Other | Admitting: Physical Medicine and Rehabilitation

## 2021-10-03 DIAGNOSIS — G608 Other hereditary and idiopathic neuropathies: Secondary | ICD-10-CM | POA: Diagnosis not present

## 2021-10-03 DIAGNOSIS — D2372 Other benign neoplasm of skin of left lower limb, including hip: Secondary | ICD-10-CM | POA: Diagnosis not present

## 2021-10-03 DIAGNOSIS — M21372 Foot drop, left foot: Secondary | ICD-10-CM | POA: Diagnosis not present

## 2021-10-03 DIAGNOSIS — R2689 Other abnormalities of gait and mobility: Secondary | ICD-10-CM | POA: Diagnosis not present

## 2021-10-03 DIAGNOSIS — M24572 Contracture, left ankle: Secondary | ICD-10-CM | POA: Diagnosis not present

## 2021-10-03 DIAGNOSIS — B351 Tinea unguium: Secondary | ICD-10-CM | POA: Diagnosis not present

## 2021-10-03 DIAGNOSIS — M79672 Pain in left foot: Secondary | ICD-10-CM | POA: Diagnosis not present

## 2021-10-05 DIAGNOSIS — Z7982 Long term (current) use of aspirin: Secondary | ICD-10-CM | POA: Diagnosis not present

## 2021-10-05 DIAGNOSIS — B351 Tinea unguium: Secondary | ICD-10-CM | POA: Diagnosis not present

## 2021-10-05 DIAGNOSIS — Z9181 History of falling: Secondary | ICD-10-CM | POA: Diagnosis not present

## 2021-10-05 DIAGNOSIS — G608 Other hereditary and idiopathic neuropathies: Secondary | ICD-10-CM | POA: Diagnosis not present

## 2021-10-05 DIAGNOSIS — Z87891 Personal history of nicotine dependence: Secondary | ICD-10-CM | POA: Diagnosis not present

## 2021-10-05 DIAGNOSIS — M24572 Contracture, left ankle: Secondary | ICD-10-CM | POA: Diagnosis not present

## 2021-10-05 DIAGNOSIS — I69398 Other sequelae of cerebral infarction: Secondary | ICD-10-CM | POA: Diagnosis not present

## 2021-10-05 DIAGNOSIS — D2372 Other benign neoplasm of skin of left lower limb, including hip: Secondary | ICD-10-CM | POA: Diagnosis not present

## 2021-10-05 DIAGNOSIS — I69354 Hemiplegia and hemiparesis following cerebral infarction affecting left non-dominant side: Secondary | ICD-10-CM | POA: Diagnosis not present

## 2021-10-05 DIAGNOSIS — I10 Essential (primary) hypertension: Secondary | ICD-10-CM | POA: Diagnosis not present

## 2021-10-05 DIAGNOSIS — M21372 Foot drop, left foot: Secondary | ICD-10-CM | POA: Diagnosis not present

## 2021-10-10 DIAGNOSIS — M24572 Contracture, left ankle: Secondary | ICD-10-CM | POA: Diagnosis not present

## 2021-10-10 DIAGNOSIS — M21372 Foot drop, left foot: Secondary | ICD-10-CM | POA: Diagnosis not present

## 2021-10-10 DIAGNOSIS — I69398 Other sequelae of cerebral infarction: Secondary | ICD-10-CM | POA: Diagnosis not present

## 2021-10-10 DIAGNOSIS — G608 Other hereditary and idiopathic neuropathies: Secondary | ICD-10-CM | POA: Diagnosis not present

## 2021-10-10 DIAGNOSIS — I69354 Hemiplegia and hemiparesis following cerebral infarction affecting left non-dominant side: Secondary | ICD-10-CM | POA: Diagnosis not present

## 2021-10-10 DIAGNOSIS — I10 Essential (primary) hypertension: Secondary | ICD-10-CM | POA: Diagnosis not present

## 2021-10-15 DIAGNOSIS — M21372 Foot drop, left foot: Secondary | ICD-10-CM | POA: Diagnosis not present

## 2021-10-15 DIAGNOSIS — G608 Other hereditary and idiopathic neuropathies: Secondary | ICD-10-CM | POA: Diagnosis not present

## 2021-10-15 DIAGNOSIS — I69354 Hemiplegia and hemiparesis following cerebral infarction affecting left non-dominant side: Secondary | ICD-10-CM | POA: Diagnosis not present

## 2021-10-15 DIAGNOSIS — I10 Essential (primary) hypertension: Secondary | ICD-10-CM | POA: Diagnosis not present

## 2021-10-15 DIAGNOSIS — M24572 Contracture, left ankle: Secondary | ICD-10-CM | POA: Diagnosis not present

## 2021-10-15 DIAGNOSIS — I69398 Other sequelae of cerebral infarction: Secondary | ICD-10-CM | POA: Diagnosis not present

## 2021-10-16 DIAGNOSIS — M21372 Foot drop, left foot: Secondary | ICD-10-CM | POA: Diagnosis not present

## 2021-10-23 DIAGNOSIS — G608 Other hereditary and idiopathic neuropathies: Secondary | ICD-10-CM | POA: Diagnosis not present

## 2021-10-23 DIAGNOSIS — I69354 Hemiplegia and hemiparesis following cerebral infarction affecting left non-dominant side: Secondary | ICD-10-CM | POA: Diagnosis not present

## 2021-10-23 DIAGNOSIS — M24572 Contracture, left ankle: Secondary | ICD-10-CM | POA: Diagnosis not present

## 2021-10-23 DIAGNOSIS — I69398 Other sequelae of cerebral infarction: Secondary | ICD-10-CM | POA: Diagnosis not present

## 2021-10-23 DIAGNOSIS — I10 Essential (primary) hypertension: Secondary | ICD-10-CM | POA: Diagnosis not present

## 2021-10-23 DIAGNOSIS — M21372 Foot drop, left foot: Secondary | ICD-10-CM | POA: Diagnosis not present

## 2021-10-25 ENCOUNTER — Encounter: Payer: Self-pay | Admitting: Physical Medicine and Rehabilitation

## 2021-10-25 ENCOUNTER — Other Ambulatory Visit: Payer: Self-pay

## 2021-10-25 ENCOUNTER — Encounter
Payer: Medicare Other | Attending: Physical Medicine and Rehabilitation | Admitting: Physical Medicine and Rehabilitation

## 2021-10-25 VITALS — BP 116/74 | HR 65 | Ht 67.0 in | Wt 157.0 lb

## 2021-10-25 DIAGNOSIS — E785 Hyperlipidemia, unspecified: Secondary | ICD-10-CM | POA: Diagnosis not present

## 2021-10-25 DIAGNOSIS — I69398 Other sequelae of cerebral infarction: Secondary | ICD-10-CM | POA: Insufficient documentation

## 2021-10-25 DIAGNOSIS — R209 Unspecified disturbances of skin sensation: Secondary | ICD-10-CM | POA: Diagnosis not present

## 2021-10-25 DIAGNOSIS — I69359 Hemiplegia and hemiparesis following cerebral infarction affecting unspecified side: Secondary | ICD-10-CM | POA: Diagnosis not present

## 2021-10-25 MED ORDER — PREGABALIN 50 MG PO CAPS: ORAL_CAPSULE | ORAL | 1 refills | Status: DC

## 2021-10-25 MED ORDER — AMLODIPINE BESYLATE 2.5 MG PO TABS: 2.5000 mg | ORAL_TABLET | Freq: Every day | ORAL | 3 refills | Status: AC

## 2021-10-25 NOTE — Progress Notes (Signed)
? ?Subjective:  ? ? Patient ID: Kelly Lowe, female    DOB: October 29, 1950, 71 y.o.   MRN: [id number] ? ?HPI ? ?Pain Inventory ?Average Pain 1 ?Pain Right Now 2 ?My pain is burning, dull, and tingling ? ?LOCATION OF PAIN  head, wrist, hand, fingers, knee, leg, ankle ? ?BOWEL ?Number of stools per week: 2 ?Oral laxative use Yes  ?Type of laxative Miralax ? ? ?BLADDER ?Normal ?Bladder incontinence No  ?Frequent urination No  ?Leakage with coughing No  ?Difficulty starting stream Yes  ?Incomplete bladder emptying No  ? ? ?Mobility ?use a cane ?ability to climb steps?  yes ?do you drive?  no ?Do you have any goals in this area?  yes ? ?Function ?disabled: date disabled 04/2017 ?I need assistance with the following:  bathing, meal prep, household duties, and shopping ? ?Neuro/Psych ?bladder control problems ?tingling ?trouble walking ?spasms ?dizziness ?anxiety ?loss of taste or smell ? ?Prior Studies ?Any changes since last visit?  no ? ?Physicians involved in your care ?Any changes since last visit?  no ? ? ?Family History  ?Problem Relation Age of Onset  ?? Stroke Maternal Grandmother   ?? Stroke Paternal Grandfather   ? ?Social History  ? ?Socioeconomic History  ?? Marital status: Divorced  ?  Spouse name: Not on file  ?? Number of children: Not on file  ?? Years of education: Not on file  ?? Highest education level: Not on file  ?Occupational History  ?? Not on file  ?Tobacco Use  ?? Smoking status: Former  ?  Packs/day: 0.50  ?  Years: 25.00  ?  Pack years: 12.50  ?  Types: Cigarettes  ?  Quit date: 05/02/2017  ?  Years since quitting: 4.4  ?? Smokeless tobacco: Never  ?Vaping Use  ?? Vaping Use: Never used  ?Substance and Sexual Activity  ?? Alcohol use: No  ?  Alcohol/week: 1.0 standard drink  ?  Types: 1 Glasses of wine per week  ?  Comment: social  ?? Drug use: No  ?? Sexual activity: Never  ?Other Topics Concern  ?? Not on file  ?Social History Narrative  ?? Not on file  ? ?Social Determinants of Health   ? ?Financial Resource Strain: Not on file  ?Food Insecurity: Not on file  ?Transportation Needs: Not on file  ?Physical Activity: Not on file  ?Stress: Not on file  ?Social Connections: Not on file  ? ?Past Surgical History:  ?Procedure Laterality Date  ?? FOOT SURGERY    ?? LOOP RECORDER INSERTION N/A 10/08/2017  ? Procedure: LOOP RECORDER INSERTION;  Surgeon: Deboraha Sprang, MD;  Location: [place] [facility];  Service: Cardiovascular;  Laterality: N/A;  ?? TEE WITHOUT CARDIOVERSION N/A 05/08/2017  ? Procedure: TRANSESOPHAGEAL ECHOCARDIOGRAM (TEE);  Surgeon: Lelon Perla, MD;  Location: [place];  Service: Cardiovascular;  Laterality: N/A;  ? ?Past Medical History:  ?Diagnosis Date  ?? Hyperlipidemia   ?? Hypertension   ?? Stroke [facility])   ? ?Ht '5\' 7"'$  (1.702 m)   BMI 21.99 kg/m?  ? ?Opioid Risk Score:   ?Fall Risk Score:  `1 ? ?Depression screen PHQ 2/9 ? ? ?  03/22/2021  ?  1:03 PM 11/09/2020  ?  2:26 PM 04/13/2020  ? 11:39 AM 01/04/2020  ? 10:43 AM 12/30/2018  ?  9:11 AM 04/09/2018  ? 11:56 AM 01/09/2018  ? 11:21 AM  ?Depression screen PHQ 2/9  ?Decreased Interest 0 0 3 3 0 0 0  ?  Down, Depressed, Hopeless 0 0 3 3 0 0 0  ?PHQ - 2 Score 0 0 6 6 0 0 0  ?  ? ?Review of Systems  ?Constitutional: Negative.   ?HENT: Negative.    ?Eyes: Negative.   ?Respiratory: Negative.    ?Cardiovascular: Negative.   ?Gastrointestinal:  Positive for constipation.  ?Endocrine: Negative.   ?Genitourinary: Negative.   ?Musculoskeletal:  Positive for gait problem.  ?Skin: Negative.   ?Allergic/Immunologic: Negative.   ?Neurological:  Positive for dizziness.  ?Hematological: Negative.   ?Psychiatric/Behavioral:  The patient is nervous/anxious.   ? ?   ?Objective:  ? Physical Exam ? ? ? ? ?   ?Assessment & Plan:  ? ? ?

## 2021-10-25 NOTE — Patient Instructions (Addendum)
HTN: ?-BP is ___today.  ?-Advised checking BP daily at home and logging results to bring into follow-up appointment with PCP and myself. ?-Reviewed BP meds today.  ?-Advised regarding healthy foods that can help lower blood pressure and provided with a list: ?1) citrus foods- high in vitamins and minerals ?2) salmon and other fatty fish - reduces inflammation and oxylipins ?3) swiss chard (leafy green)- high level of nitrates ?4) pumpkin seeds- one of the best natural sources of magnesium ?5) Beans and lentils- high in fiber, magnesium, and potassium ?6) Berries- high in flavonoids ?7) Amaranth (whole grain, can be cooked similarly to rice and oats)- high in magnesium and fiber ?8) Pistachios- even more effective at reducing BP than other nuts ?9) Carrots- high in phenolic compounds that relax blood vessels and reduce inflammation ?10) Celery- contain phthalides that relax tissues of arterial walls ?11) Tomatoes- can also improve cholesterol and reduce risk of heart disease ?12) Broccoli- good source of magnesium, calcium, and potassium ?13) Greek yogurt: high in potassium and calcium ?14) Herbs and spices: Celery seed, cilantro, saffron, lemongrass, black cumin, ginseng, cinnamon, cardamom, sweet basil, and ginger ?15) Chia and flax seeds- also help to lower cholesterol and blood sugar ?16) Beets- high levels of nitrates that relax blood vessels  ?17) spinach and bananas- high in potassium ? ?-Provided lise of supplements that can help with hypertension:  ?1) magnesium: one high quality brand is Bioptemizers since it contains all 7 types of magnesium, otherwise over the counter magnesium gluconate '400mg'$  is a good option ?2) B vitamins ?3) vitamin D ?4) potassium ?5) CoQ10 ?6) L-arginine ?7) Vitamin C ?8) Beetroot ?-Educated that goal BP is 120/80. ?-Made goal to incorporate some of the above foods into diet.   ? ?Constipation:  ?-Provided list of following foods that help with constipation and highlighted a  few: ?1) prunes- contain high amounts of fiber.  ?2) apples- has a form of dietary fiber called pectin that accelerates stool movement and increases beneficial gut bacteria ?3) pears- in addition to fiber, also high in fructose and sorbitol which have laxative effect ?4) figs- contain an enzyme ficin which helps to speed colonic transit ?5) kiwis- contain an enzyme actinidin that improves gut motility and reduces constipation ?6) oranges- rich in pectin (like apples) ?7) grapefruits- contain a flavanol naringenin which has a laxative effect ?8) vegetables- rich in fiber and also great sources of folate, vitamin C, and K ?9) artichoke- high in inulin, prebiotic great for the microbiome ?10) chicory- increases stool frequency and softness (can be added to coffee) ?11) rhubarb- laxative effect ?12) sweet potato- high fiber ?13) beans, peas, and lentils- contain both soluble and insoluble fiber ?14) chia seeds- improves intestinal health and gut flora ?15) flaxseeds- laxative effect ?16) whole grain rye bread- high in fiber ?17) oat bran- high in soluble and insoluble fiber ?18) kefir- softens stools ?-recommended to try at least one of these foods every day.  ?-drink 6-8 glasses of water per day ?-walk regularly, especially after meals.  ? ?Provided with list of supplements that can help with dyslipidemia: ?1) Vitamin B3 500-4,'000mg'$  in divided doses daily (would recommend starting low as can cause uncomfortable facial flushing if started at too high a dose) ?2) Phytosterols 2.15 grams daily ?3) Fermented soy 30-50 grams daily ?4) EGCG (found in green tea): 500-'1000mg'$  daily ?5) Omega-3 fatty acids 3000-5,'000mg'$  daily ?6) Flax seed 40 grams daily ?7) Monounsaturated fats 20-40 grams daily (olives, olive oil, nuts), also reduces cardiovascular disease ?  8) Sesame: 40 grams daily ?9) Gamma/delta tocotrienols- a family of unsaturated forms of Vitamin E- '200mg'$  with dinner ?10) Pantethine '900mg'$  daily in divided doses ?11)  Resveratrol '250mg'$  daily ?12) N Acetyl Cysteine '2000mg'$  daily in divided doses ?13) Curcumin 2000-'5000mg'$  in divided doses daily ?14) Pomegranate juice: 8 ounces daily, also helps to lower blood pressure ?15) Pomegranate seeds one cup daily, also helps to lower blood pressure ?16) Citrus Bergamot '1000mg'$  daily, also helps with glucose control and weight loss ?17) Vitamin C '500mg'$  daily ?18) Quercetin 500-'1000mg'$  daily ?19) Glutathione ?20) Probiotics 60-100 billion organisms per day ?21) Fiber ?22) Oats ?23) Aged garlic (can eat as food or supplement of 600-'900mg'$  per day) ?24) Chia seeds 25 grams per day ?25) Lycopene- carotenoid found in high concentrations in tomatoes. ?26) Alpha linolenic acid ?27) Flavonoids and anthocyanins ?28) Wogonin- flavanoid that enhances reverse cholesterol transport ?29) Coenzyme Q10 ?30) Pantethine- derivative of Vitamin B5: '300mg'$  three times per day or '450mg'$  twice per day with or without food ?31) Barley and other whole grains ?32) Orange juice ?33) L- carnitine ?34) L- Lysine ?35) L- Arginine ?36) Almonds ?21) Morin ?38) Rutin ?39) Carnosine ?40) Histidine  ?41) Kaempferol  ?42) Organosulfur compounds ?43) Vitamin E ?44) Oleic acid ?45) RBO (ferulic acid gammaoryzanol) ?46) grape seed extract ?47) Red wine ?48) Berberine HCL '500mg'$  daily or twice per day- more effective and with fewer adverse effects that ezetimibe monotherapy ?49) red yeast rice 2400- 4800 mg/day ?50) chlorella ?51) Licorice  ? ? ?  ?

## 2021-10-25 NOTE — Progress Notes (Signed)
? ?Subjective:  ? ? Patient ID: Kelly Lowe, female    DOB: 08/20/1950, 71 y.o.   MRN: [id number] ? ?HPI  ?Female with history of HTN, right foot fractures presents for follow for right basal ganglia infarct in 2018. She had weakness on the left side. They tried the SPRINT device, TENS unit, PT and OT, Botox treatments. They tried everything to get the arm and leg working at night.  ? ?Daughter supplements history. Patient is in [facility] therapies.  She is getting out more.   ? ?The stroke affects her emotions and she had uncontrollable cruing ? ?She has never damage to her mouth and has cotton mouth sensation. Feels numb, swollen, and burning all the same time ? ?She wants to eat certain things because it's easier but may not always be healthier.  ? ?Battery went dead in loop recorder.  ? ?Has been at home living independently for a year.  ? ?She was doing therapy at one time but insurance stopped approving it.  ? ?Pain Inventory ?Average Pain 2 ?Pain Right Now 2 ?My pain is dull ? ?In the last 24 hours, has pain interfered with the following? ?General activity 2 ?Relation with others 2 ?Enjoyment of life 2 ?What TIME of day is your pain at its worst? night ?Sleep (in general) Fair ? ?Pain is worse with: some activites ?Pain improves with: rest and heat/ice ?Relief from Meds: 1 ? ? ?Family History  ?Problem Relation Age of Onset  ? Stroke Maternal Grandmother   ? Stroke Paternal Grandfather   ? ?Social History  ? ?Socioeconomic History  ? Marital status: Divorced  ?  Spouse name: Not on file  ? Number of children: Not on file  ? Years of education: Not on file  ? Highest education level: Not on file  ?Occupational History  ? Not on file  ?Tobacco Use  ? Smoking status: Former  ?  Packs/day: 0.50  ?  Years: 25.00  ?  Pack years: 12.50  ?  Types: Cigarettes  ?  Quit date: 05/02/2017  ?  Years since quitting: 4.4  ? Smokeless tobacco: Never  ?Vaping Use  ? Vaping Use: Never used  ?Substance and Sexual Activity  ?  Alcohol use: No  ?  Alcohol/week: 1.0 standard drink  ?  Types: 1 Glasses of wine per week  ?  Comment: social  ? Drug use: No  ? Sexual activity: Never  ?Other Topics Concern  ? Not on file  ?Social History Narrative  ? Not on file  ? ?Social Determinants of Health  ? ?Financial Resource Strain: Not on file  ?Food Insecurity: Not on file  ?Transportation Needs: Not on file  ?Physical Activity: Not on file  ?Stress: Not on file  ?Social Connections: Not on file  ? ?Past Surgical History:  ?Procedure Laterality Date  ? FOOT SURGERY    ? LOOP RECORDER INSERTION N/A 10/08/2017  ? Procedure: LOOP RECORDER INSERTION;  Surgeon: Deboraha Sprang, MD;  Location: [place] [facility];  Service: Cardiovascular;  Laterality: N/A;  ? TEE WITHOUT CARDIOVERSION N/A 05/08/2017  ? Procedure: TRANSESOPHAGEAL ECHOCARDIOGRAM (TEE);  Surgeon: Lelon Perla, MD;  Location: [place];  Service: Cardiovascular;  Laterality: N/A;  ? ?Past Medical History:  ?Diagnosis Date  ? Hyperlipidemia   ? Hypertension   ? Stroke [facility])   ? ?Ht '5\' 7"'$  (1.702 m)   BMI 21.99 kg/m?  ? ?Opioid Risk Score:   ?Fall Risk Score:  `1 ? ?  Depression screen PHQ 2/9 ? ? ?  03/22/2021  ?  1:03 PM 11/09/2020  ?  2:26 PM 04/13/2020  ? 11:39 AM 01/04/2020  ? 10:43 AM 12/30/2018  ?  9:11 AM 04/09/2018  ? 11:56 AM 01/09/2018  ? 11:21 AM  ?Depression screen PHQ 2/9  ?Decreased Interest 0 0 3 3 0 0 0  ?Down, Depressed, Hopeless 0 0 3 3 0 0 0  ?PHQ - 2 Score 0 0 6 6 0 0 0  ? ? ?Review of Systems  ?Constitutional: Negative.   ?HENT: Negative.    ?Eyes: Negative.   ?Respiratory: Negative.    ?Cardiovascular: Negative.   ?Gastrointestinal:  Positive for constipation.  ?     Poor appetite  ?Endocrine: Negative.   ?Genitourinary:  Positive for urgency.  ?Musculoskeletal:  Positive for arthralgias.  ?     Spasms, pain in left shoulder,left hand aching,  ?Skin:  Positive for rash.  ?Allergic/Immunologic: Negative.   ?Neurological:  Positive for weakness and numbness.  ?     Tingling ?   ?Hematological: Negative.   ?Psychiatric/Behavioral:  Positive for confusion and dysphoric mood. The patient is nervous/anxious.   ?All other systems reviewed and are negative. ?   ?Objective:  ? Physical Exam  ?Gen: no distress, normal appearing ?HEENT: oral mucosa pink and moist, NCAT ?Cardio: Reg rate ?Chest: normal effort, normal rate of breathing ?Abd: soft, non-distended ?Ext: no edema ?Psych: pleasant, normal affect ?Skin: intact ?Neuro: ?Left shoulder subluxation, stable ?Gait: Not assessed today -in wheelchair ?Neurological: Alert ?Motor: LUE: 2+/5 shoulder abduction, elbow flex 3+/5, elbow ext 2-/5, hand grip 4-/5 with apraxia ?LLE: 4-/5 HF, 4/5 KE, ADF/PF 2/5  ?mAS: left shoulder abductors: 2/4, elbow flexors: 2/4, wrist flexors 3/4, finger flexors 3/4, thumb flexors 1/4 ?Left ADF/PF 1+/4 ?Follows commands.  ?Insight and awareness into deficits.  ?   ?Assessment & Plan:  ?Female with history of HTN, right foot fractures presents for follow for right basal ganglia infarct.  ?  ?1.  Left spastic hemiparesis, fatigue, and functional deficits secondary to right basal ganglia infarct ? Completed HH therapies.  ?            No benefit with amantadine ? Encouraged WHO/PRAFO ? Dysport injections did not provide enough relief, referred for eval.  ? Discussed aquatherapy ? Continue Baclofen to 10 qhs ? Previously educated on limiting sling use, which patient is now doing resulting in decreased tone - cont ROM ?Discussed Bioness and vagal nerve stimulation ?Discussed benefits of exercise.    ? ?2. Gait abnormality ? Continue cane for safety ?  ?3. Sleep disturbance ? Improved overall ? ?4. Mood lability: ?            Patient, now benefiting with Psychology ? Defer Cymbalta management to Psychiatry ?            Nudexta ordered by Neurology - but cannot afford ? Continue following up with Psychiatry ? Major limiting factor - continues to be at present ? Continue to follow up with Psych ? ?Improving ? Continue  trintelix.  ? ?5. Left shoulder subluxation with pain ?            Kinesio taping on hold due to reaction to tape ?            SPRINT placed on 9/5, removed on 11/6 ?            Tramadol per PCP (educated on signs/symptoms serotonin syndrome) ?  Ortho consult ordered by Neurology NP - recommended therapies, which she completed ?  ?6. Bony prominence right wrist ?            Xray with bony prominence ?            Voltaren gel in effective ? Good benefits with injection on 9/26, patient elected to indefinitely postpone procedure due to lack of pain today ?  ?7. Right hand numbness ?            Managed at present ?            Positional ?            Improved ? ?8. Urinary urgency ? Unlikely stroke related ? Improving ? ?9. Pseudodementia  ? See #4 ? Encouraged brain simulating activities ? ?10. Dysesthesias in tongue ? Did not respond to Gabapentin ? Refilled Lyrica 50 BID with good benefit ? Discussed interactions with other medications.  ? ?11. Former smoker: ?-commended on stopping smoking! ? ?12. HTN: ?-BP is 116/74 today.  ?-Advised checking BP daily at home and logging results to bring into follow-up appointment with PCP and myself. ?-Reviewed BP meds today.  ?-Advised regarding healthy foods that can help lower blood pressure and provided with a list: ?1) citrus foods- high in vitamins and minerals ?2) salmon and other fatty fish - reduces inflammation and oxylipins ?3) swiss chard (leafy green)- high level of nitrates ?4) pumpkin seeds- one of the best natural sources of magnesium ?5) Beans and lentils- high in fiber, magnesium, and potassium ?6) Berries- high in flavonoids ?7) Amaranth (whole grain, can be cooked similarly to rice and oats)- high in magnesium and fiber ?8) Pistachios- even more effective at reducing BP than other nuts ?9) Carrots- high in phenolic compounds that relax blood vessels and reduce inflammation ?10) Celery- contain phthalides that relax tissues of arterial walls ?11)  Tomatoes- can also improve cholesterol and reduce risk of heart disease ?12) Broccoli- good source of magnesium, calcium, and potassium ?13) Greek yogurt: high in potassium and calcium ?14) Herbs and spices: Celery seed, cila

## 2021-10-26 ENCOUNTER — Encounter: Payer: Self-pay | Admitting: Physical Medicine and Rehabilitation

## 2021-10-26 LAB — LIPID PANEL W/O CHOL/HDL RATIO
Cholesterol, Total: 130 mg/dL (ref 100–199)
HDL: 58 mg/dL (ref >39)
LDL Chol Calc (NIH): 58 mg/dL (ref 0–99)
Triglycerides: 65 mg/dL (ref 0–149)
VLDL Cholesterol Cal: 14 mg/dL (ref 5–40)

## 2021-10-29 DIAGNOSIS — G608 Other hereditary and idiopathic neuropathies: Secondary | ICD-10-CM | POA: Diagnosis not present

## 2021-10-29 DIAGNOSIS — I69354 Hemiplegia and hemiparesis following cerebral infarction affecting left non-dominant side: Secondary | ICD-10-CM | POA: Diagnosis not present

## 2021-10-29 DIAGNOSIS — I10 Essential (primary) hypertension: Secondary | ICD-10-CM | POA: Diagnosis not present

## 2021-10-29 DIAGNOSIS — M24572 Contracture, left ankle: Secondary | ICD-10-CM | POA: Diagnosis not present

## 2021-10-29 DIAGNOSIS — M21372 Foot drop, left foot: Secondary | ICD-10-CM | POA: Diagnosis not present

## 2021-10-29 DIAGNOSIS — I69398 Other sequelae of cerebral infarction: Secondary | ICD-10-CM | POA: Diagnosis not present

## 2021-11-04 ENCOUNTER — Other Ambulatory Visit: Payer: Self-pay | Admitting: Physical Medicine & Rehabilitation

## 2021-11-04 DIAGNOSIS — I69398 Other sequelae of cerebral infarction: Secondary | ICD-10-CM | POA: Diagnosis not present

## 2021-11-04 DIAGNOSIS — G608 Other hereditary and idiopathic neuropathies: Secondary | ICD-10-CM | POA: Diagnosis not present

## 2021-11-04 DIAGNOSIS — Z9181 History of falling: Secondary | ICD-10-CM | POA: Diagnosis not present

## 2021-11-04 DIAGNOSIS — I10 Essential (primary) hypertension: Secondary | ICD-10-CM | POA: Diagnosis not present

## 2021-11-04 DIAGNOSIS — B351 Tinea unguium: Secondary | ICD-10-CM | POA: Diagnosis not present

## 2021-11-04 DIAGNOSIS — Z7982 Long term (current) use of aspirin: Secondary | ICD-10-CM | POA: Diagnosis not present

## 2021-11-04 DIAGNOSIS — M21372 Foot drop, left foot: Secondary | ICD-10-CM | POA: Diagnosis not present

## 2021-11-04 DIAGNOSIS — D2372 Other benign neoplasm of skin of left lower limb, including hip: Secondary | ICD-10-CM | POA: Diagnosis not present

## 2021-11-04 DIAGNOSIS — Z87891 Personal history of nicotine dependence: Secondary | ICD-10-CM | POA: Diagnosis not present

## 2021-11-04 DIAGNOSIS — I69354 Hemiplegia and hemiparesis following cerebral infarction affecting left non-dominant side: Secondary | ICD-10-CM | POA: Diagnosis not present

## 2021-11-04 DIAGNOSIS — M24572 Contracture, left ankle: Secondary | ICD-10-CM | POA: Diagnosis not present

## 2021-11-07 DIAGNOSIS — I69354 Hemiplegia and hemiparesis following cerebral infarction affecting left non-dominant side: Secondary | ICD-10-CM | POA: Diagnosis not present

## 2021-11-07 DIAGNOSIS — I69398 Other sequelae of cerebral infarction: Secondary | ICD-10-CM | POA: Diagnosis not present

## 2021-11-07 DIAGNOSIS — M24572 Contracture, left ankle: Secondary | ICD-10-CM | POA: Diagnosis not present

## 2021-11-07 DIAGNOSIS — I10 Essential (primary) hypertension: Secondary | ICD-10-CM | POA: Diagnosis not present

## 2021-11-07 DIAGNOSIS — G608 Other hereditary and idiopathic neuropathies: Secondary | ICD-10-CM | POA: Diagnosis not present

## 2021-11-07 DIAGNOSIS — M21372 Foot drop, left foot: Secondary | ICD-10-CM | POA: Diagnosis not present

## 2021-11-09 ENCOUNTER — Other Ambulatory Visit: Payer: Self-pay | Admitting: Physical Medicine and Rehabilitation

## 2021-11-09 DIAGNOSIS — I69359 Hemiplegia and hemiparesis following cerebral infarction affecting unspecified side: Secondary | ICD-10-CM

## 2021-11-13 DIAGNOSIS — I10 Essential (primary) hypertension: Secondary | ICD-10-CM | POA: Diagnosis not present

## 2021-11-13 DIAGNOSIS — G608 Other hereditary and idiopathic neuropathies: Secondary | ICD-10-CM | POA: Diagnosis not present

## 2021-11-13 DIAGNOSIS — M24572 Contracture, left ankle: Secondary | ICD-10-CM | POA: Diagnosis not present

## 2021-11-13 DIAGNOSIS — M21372 Foot drop, left foot: Secondary | ICD-10-CM | POA: Diagnosis not present

## 2021-11-13 DIAGNOSIS — I69354 Hemiplegia and hemiparesis following cerebral infarction affecting left non-dominant side: Secondary | ICD-10-CM | POA: Diagnosis not present

## 2021-11-13 DIAGNOSIS — I69398 Other sequelae of cerebral infarction: Secondary | ICD-10-CM | POA: Diagnosis not present

## 2021-11-20 DIAGNOSIS — M21372 Foot drop, left foot: Secondary | ICD-10-CM | POA: Diagnosis not present

## 2021-11-20 DIAGNOSIS — I69354 Hemiplegia and hemiparesis following cerebral infarction affecting left non-dominant side: Secondary | ICD-10-CM | POA: Diagnosis not present

## 2021-11-20 DIAGNOSIS — I10 Essential (primary) hypertension: Secondary | ICD-10-CM | POA: Diagnosis not present

## 2021-11-20 DIAGNOSIS — M24572 Contracture, left ankle: Secondary | ICD-10-CM | POA: Diagnosis not present

## 2021-11-20 DIAGNOSIS — I69398 Other sequelae of cerebral infarction: Secondary | ICD-10-CM | POA: Diagnosis not present

## 2021-11-20 DIAGNOSIS — G608 Other hereditary and idiopathic neuropathies: Secondary | ICD-10-CM | POA: Diagnosis not present

## 2021-11-28 DIAGNOSIS — M24572 Contracture, left ankle: Secondary | ICD-10-CM | POA: Diagnosis not present

## 2021-11-28 DIAGNOSIS — I69398 Other sequelae of cerebral infarction: Secondary | ICD-10-CM | POA: Diagnosis not present

## 2021-11-28 DIAGNOSIS — M21372 Foot drop, left foot: Secondary | ICD-10-CM | POA: Diagnosis not present

## 2021-11-28 DIAGNOSIS — G608 Other hereditary and idiopathic neuropathies: Secondary | ICD-10-CM | POA: Diagnosis not present

## 2021-11-28 DIAGNOSIS — I69354 Hemiplegia and hemiparesis following cerebral infarction affecting left non-dominant side: Secondary | ICD-10-CM | POA: Diagnosis not present

## 2021-11-28 DIAGNOSIS — I10 Essential (primary) hypertension: Secondary | ICD-10-CM | POA: Diagnosis not present

## 2022-01-16 DIAGNOSIS — B351 Tinea unguium: Secondary | ICD-10-CM | POA: Diagnosis not present

## 2022-01-16 DIAGNOSIS — G608 Other hereditary and idiopathic neuropathies: Secondary | ICD-10-CM | POA: Diagnosis not present

## 2022-01-16 DIAGNOSIS — D2372 Other benign neoplasm of skin of left lower limb, including hip: Secondary | ICD-10-CM | POA: Diagnosis not present

## 2022-01-16 DIAGNOSIS — M24572 Contracture, left ankle: Secondary | ICD-10-CM | POA: Diagnosis not present

## 2022-01-16 DIAGNOSIS — M21372 Foot drop, left foot: Secondary | ICD-10-CM | POA: Diagnosis not present

## 2022-01-16 DIAGNOSIS — M79672 Pain in left foot: Secondary | ICD-10-CM | POA: Diagnosis not present

## 2022-01-16 DIAGNOSIS — R2689 Other abnormalities of gait and mobility: Secondary | ICD-10-CM | POA: Diagnosis not present

## 2022-01-24 ENCOUNTER — Telehealth: Payer: Self-pay | Admitting: Neurology

## 2022-01-31 DIAGNOSIS — I10 Essential (primary) hypertension: Secondary | ICD-10-CM | POA: Diagnosis not present

## 2022-01-31 DIAGNOSIS — G4700 Insomnia, unspecified: Secondary | ICD-10-CM | POA: Diagnosis not present

## 2022-01-31 DIAGNOSIS — Z0000 Encounter for general adult medical examination without abnormal findings: Secondary | ICD-10-CM | POA: Diagnosis not present

## 2022-01-31 DIAGNOSIS — M810 Age-related osteoporosis without current pathological fracture: Secondary | ICD-10-CM | POA: Diagnosis not present

## 2022-01-31 DIAGNOSIS — G8194 Hemiplegia, unspecified affecting left nondominant side: Secondary | ICD-10-CM | POA: Diagnosis not present

## 2022-01-31 DIAGNOSIS — I639 Cerebral infarction, unspecified: Secondary | ICD-10-CM | POA: Diagnosis not present

## 2022-01-31 DIAGNOSIS — I6523 Occlusion and stenosis of bilateral carotid arteries: Secondary | ICD-10-CM | POA: Diagnosis not present

## 2022-01-31 DIAGNOSIS — I679 Cerebrovascular disease, unspecified: Secondary | ICD-10-CM | POA: Diagnosis not present

## 2022-01-31 DIAGNOSIS — Z1331 Encounter for screening for depression: Secondary | ICD-10-CM | POA: Diagnosis not present

## 2022-01-31 DIAGNOSIS — E785 Hyperlipidemia, unspecified: Secondary | ICD-10-CM | POA: Diagnosis not present

## 2022-01-31 DIAGNOSIS — Z23 Encounter for immunization: Secondary | ICD-10-CM | POA: Diagnosis not present

## 2022-02-04 ENCOUNTER — Telehealth: Payer: Self-pay | Admitting: Neurology

## 2022-02-04 NOTE — Telephone Encounter (Signed)
Rescheduled 9/19 appointment with pt's daughter over the phone - MD out

## 2022-02-19 DIAGNOSIS — F339 Major depressive disorder, recurrent, unspecified: Secondary | ICD-10-CM | POA: Diagnosis not present

## 2022-03-26 ENCOUNTER — Institutional Professional Consult (permissible substitution): Payer: Medicare Other | Admitting: Neurology

## 2022-04-16 DIAGNOSIS — G608 Other hereditary and idiopathic neuropathies: Secondary | ICD-10-CM | POA: Diagnosis not present

## 2022-04-16 DIAGNOSIS — M21372 Foot drop, left foot: Secondary | ICD-10-CM | POA: Diagnosis not present

## 2022-04-16 DIAGNOSIS — M79672 Pain in left foot: Secondary | ICD-10-CM | POA: Diagnosis not present

## 2022-04-16 DIAGNOSIS — B351 Tinea unguium: Secondary | ICD-10-CM | POA: Diagnosis not present

## 2022-04-16 DIAGNOSIS — R2689 Other abnormalities of gait and mobility: Secondary | ICD-10-CM | POA: Diagnosis not present

## 2022-04-16 DIAGNOSIS — M24572 Contracture, left ankle: Secondary | ICD-10-CM | POA: Diagnosis not present

## 2022-04-16 DIAGNOSIS — D2372 Other benign neoplasm of skin of left lower limb, including hip: Secondary | ICD-10-CM | POA: Diagnosis not present

## 2022-04-23 ENCOUNTER — Ambulatory Visit: Payer: Medicare Other | Admitting: Neurology

## 2022-04-24 ENCOUNTER — Other Ambulatory Visit: Payer: Self-pay | Admitting: Physical Medicine and Rehabilitation

## 2022-05-07 ENCOUNTER — Other Ambulatory Visit: Payer: Self-pay | Admitting: Physical Medicine and Rehabilitation

## 2022-05-08 ENCOUNTER — Ambulatory Visit (INDEPENDENT_AMBULATORY_CARE_PROVIDER_SITE_OTHER): Payer: Medicare Other | Admitting: Neurology

## 2022-05-08 ENCOUNTER — Encounter: Payer: Self-pay | Admitting: Neurology

## 2022-05-08 ENCOUNTER — Ambulatory Visit: Payer: Medicare Other | Admitting: Neurology

## 2022-05-08 VITALS — BP 130/83 | HR 61 | Ht 67.0 in | Wt 163.6 lb

## 2022-05-08 DIAGNOSIS — R2689 Other abnormalities of gait and mobility: Secondary | ICD-10-CM | POA: Diagnosis not present

## 2022-05-08 DIAGNOSIS — R42 Dizziness and giddiness: Secondary | ICD-10-CM | POA: Diagnosis not present

## 2022-05-08 DIAGNOSIS — I672 Cerebral atherosclerosis: Secondary | ICD-10-CM

## 2022-05-08 DIAGNOSIS — I69398 Other sequelae of cerebral infarction: Secondary | ICD-10-CM

## 2022-05-08 DIAGNOSIS — G8110 Spastic hemiplegia affecting unspecified side: Secondary | ICD-10-CM | POA: Diagnosis not present

## 2022-05-08 DIAGNOSIS — R4189 Other symptoms and signs involving cognitive functions and awareness: Secondary | ICD-10-CM | POA: Diagnosis not present

## 2022-05-08 DIAGNOSIS — E0841 Diabetes mellitus due to underlying condition with diabetic mononeuropathy: Secondary | ICD-10-CM | POA: Diagnosis not present

## 2022-05-08 NOTE — Patient Instructions (Signed)
I had a long discussion with the patient and her daughter about her complaints of brain fog and dizziness and imbalance.  I recommended t further evaluation by checking memory panel labs, EEG and she does not want Brain imaging at this time..  Also check lipid profile hemoglobin A1c.  I recommend that she get up slowly and avoid sudden movements.  I will also order a new ankle-foot orthotic device for better stability..  She was encouraged to use a cane at all times discussed fall precautions.  Continue aspirin 81 mg daily for stroke prevention and maintain aggressive risk factor modification with strict control of hypertension with blood pressure goal below 140/90 cholesterol goal below 70 mg and hemoglobin A1c goal below 6.5%.  She will return for follow-up in the future in 6 months with my nurse practitioner or call earlier if necessary.  Fall Prevention in the Home, Adult Falls can cause injuries and can happen to people of all ages. There are many things you can do to make your home safe and to help prevent falls. Ask for help when making these changes. What actions can I take to prevent falls? General Instructions Use good lighting in all rooms. Replace any light bulbs that burn out. Turn on the lights in dark areas. Use night-lights. Keep items that you use often in easy-to-reach places. Lower the shelves around your home if needed. Set up your furniture so you have a clear path. Avoid moving your furniture around. Do not have throw rugs or other things on the floor that can make you trip. Avoid walking on wet floors. If any of your floors are uneven, fix them. Add color or contrast paint or tape to clearly mark and help you see: Grab bars or handrails. First and last steps of staircases. Where the edge of each step is. If you use a stepladder: Make sure that it is fully opened. Do not climb a closed stepladder. Make sure the sides of the stepladder are locked in place. Ask someone to hold  the stepladder while you use it. Know where your pets are when moving through your home. What can I do in the bathroom?     Keep the floor dry. Clean up any water on the floor right away. Remove soap buildup in the tub or shower. Use nonskid mats or decals on the floor of the tub or shower. Attach bath mats securely with double-sided, nonslip rug tape. If you need to sit down in the shower, use a plastic, nonslip stool. Install grab bars by the toilet and in the tub and shower. Do not use towel bars as grab bars. What can I do in the bedroom? Make sure that you have a light by your bed that is easy to reach. Do not use any sheets or blankets for your bed that hang to the floor. Have a firm chair with side arms that you can use for support when you get dressed. What can I do in the kitchen? Clean up any spills right away. If you need to reach something above you, use a step stool with a grab bar. Keep electrical cords out of the way. Do not use floor polish or wax that makes floors slippery. What can I do with my stairs? Do not leave any items on the stairs. Make sure that you have a light switch at the top and the bottom of the stairs. Make sure that there are handrails on both sides of the stairs. Fix handrails  that are broken or loose. Install nonslip stair treads on all your stairs. Avoid having throw rugs at the top or bottom of the stairs. Choose a carpet that does not hide the edge of the steps on the stairs. Check carpeting to make sure that it is firmly attached to the stairs. Fix carpet that is loose or worn. What can I do on the outside of my home? Use bright outdoor lighting. Fix the edges of walkways and driveways and fix any cracks. Remove anything that might make you trip as you walk through a door, such as a raised step or threshold. Trim any bushes or trees on paths to your home. Check to see if handrails are loose or broken and that both sides of all steps have  handrails. Install guardrails along the edges of any raised decks and porches. Clear paths of anything that can make you trip, such as tools or rocks. Have leaves, snow, or ice cleared regularly. Use sand or salt on paths during winter. Clean up any spills in your garage right away. This includes grease or oil spills. What other actions can I take? Wear shoes that: Have a low heel. Do not wear high heels. Have rubber bottoms. Feel good on your feet and fit well. Are closed at the toe. Do not wear open-toe sandals. Use tools that help you move around if needed. These include: Canes. Walkers. Scooters. Crutches. Review your medicines with your doctor. Some medicines can make you feel dizzy. This can increase your chance of falling. Ask your doctor what else you can do to help prevent falls. Where to find more information Centers for Disease Control and Prevention, STEAD[employer]: http://www.wolf.info/ National Institute on Aging: http://kim-miller.com/ Contact a doctor if: You are afraid of falling at home. You feel weak, drowsy, or dizzy at home. You fall at home. Summary There are many simple things that you can do to make your home safe and to help prevent falls. Ways to make your home safe include removing things that can make you trip and installing grab bars in the bathroom. Ask for help when making these changes in your home. This information is not intended to replace advice given to you by your health care provider. Make sure you discuss any questions you have with your health care provider. Document Revised: 04/23/2021 Document Reviewed: 02/23/2020 Elsevier Patient Education  [place].

## 2022-05-08 NOTE — Progress Notes (Signed)
[facility] [address] [place]. [place] [address] [phone]       OFFICE FOLLOW-UP NOTE  [name] Date of Birth:  04-Apr-1951 Medical Record Number:  [id number]   HPI: Update 05/08/2022 : Patient seen for follow-up today after last virtual follow-up visit with Kelly Lowe nurse practitioner in December 2020.  She is referred back by primary care physician because patient feeling dizzy, poor balance, slight pressure on the head as well as brain fog.  He states his symptoms are longstanding and began after her stroke in 2018 never improved.  She denies any significant recent worsening of her symptoms or new complaints like slurred speech, fall headache.  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  She is tolerating well without bleeding or bruising.  Cholesterol checked.  She states her blood pressures under good control. Initial visit 08/22/2017 Dr. Leonie Lowe :[name] is seen today for the first office follow-up visit following hospital admission for stroke in September  2018. She is accompanied by her daughter. History is obtained from them as well as review of electronic medical records. I have personally reviewed imaging films.Kelly Lowe is a 71 y.o. female last in her normal state of health last night around 109 which went to bed.. She awoke with left-sided weakness. Due to the presence of gaze deviation and weakness, code stroke was activated and she was taken for CT perfusion/angiogram. This was negative for large vessel occlusion. LKW: 05/02/17 night prior to bed.tpa given?: no, outside of window. CT angiogram was negative for large vessel stenosis or occlusion. MRI scan of the brain showed a large 4.4 x 2.1 x 3.2 cm acute ischemic infarct involving the right basal ganglia with a tiny punctate infarct involving the left splenium of corpus callosum. Transthoracic echo showed normal ejection fraction and  trans-esophageal echocardiogram showed no evidence of PFO or cardiac source of embolism. Lower extremity venous Doppler showed a small gastrocnemius vein clot. LDL cholesterol 114 mg percent and implement A1c was 5.4. Transcranial Doppler bubble study was negative for PFO Patient was started on aspirin for stroke prevention. She states she's done well since discharge and is progressing with physical therapy but yet has significant residual spastic left hemiplegia. She is able to use a cane because she is a wheelchair for long distances. Is working with therapy twice a week. She has quit smoking completely. She has lost about 12 pounds weight as well. He has not yet had a loop recorder inserted and has questions about the procedure. She wants to walk better with improved balance. She does have a left foot brace but she is not happy that she has to use it all the time.   Update 12/08/2017 Dr. Leonie Lowe: she returns for follow-up after last by daughter.es to have spastic left hemiplegia and is now walks with a hemiwalke She is seeing Dr. Delice Lowe and getting Botox to her left arm and leg. She feels that that has been no significant improvement so far. She is complaining of increased pain and weakness in in her right wrist which may be related to  muscle overuse or carpal tunnel as she predominantly uses her right hand to hold her hemiwalker and grabs it tightly as she is scared to fallShe states her blood pressure is well controlled today it is 112/68. She is tolerating aspirin well without bleeding or bruising. She still has some residual   and numbness from her  stroke which has not yet improved. She is tolerating aspirin well without bruising or bleeding. Her blood pressure is well controlled and today it is 112/68. She has had no falls or injuries.   Interval history 08/10/2018: Patient is being seen today for 73-monthfollow-up visit and is accompanied by her daughter.  She did undergo EMG/nerve conduction study  which was normal without any evidence of nerve impingement.  She continues to follow with Dr. PPosey Prontoand physical medicine for Botox injections due to continued post stroke spasticity. She does not believe this has provided any benefit. They do have scheduled f//u appt on 08/20/18.  Daughter is also concerned regarding her left shoulder and was told this was "dislocated" with multiple treatment attempts by physical medicine but she continues to experience pain and decreased range of motion in that shoulder.  Daughter is concerned as this could be preventing possible further recovery and is requesting referral to orthopedics.  Patient has recently moved back where she lives independently in 07/2018 (previously living with her daughter).  Daughter does continue to assist minimally with bathing and dressing but does need further assistance with IADLs.  She continues to ambulate with her cane and denies any recent falls.  Daughter also has concerns regarding her mood and "secluding herself".  Patient started to become tearful during appointment stating that she is fearful of contacting any of her friends as she is "unable to communicate and they well understand what I am saying".  She has trialed Prozac in the past but discontinued due to possible side effects and continues on Cymbalta without much benefit.  Daughter does state that initially she would have outbursts of laughing or become amused easily but more recently she has been experiencing crying outbursts and not necessarily during appropriate times.  She continues on aspirin without side effects of bleeding or bruising.  Continues on atorvastatin without side effects myalgias.  Blood pressure today 117/76.  Daughter is questioning whether antihypertensives can start to be weaned off as her blood pressures consistently on the lower side.  Loop recorder has not shown atrial fibrillation thus far.  No further concerns at this time.  Denies new or worsening stroke/TIA  symptoms.  ROS:   14 system review of systems is positive for weakness, gait difficulty, imbalance, brain fogginess, head pressure no other systems negative  PMH:  Past Medical History:  Diagnosis Date   Hyperlipidemia    Hypertension    Stroke ([facility]     Social History:  Social History   Socioeconomic History   Marital status: Divorced    Spouse name: Not on file   Number of children: Not on file   Years of education: Not on file   Highest education level: Not on file  Occupational History   Not on file  Tobacco Use   Smoking status: Former    Packs/day: 0.50    Years: 25.00    Total pack years: 12.50    Types: Cigarettes    Quit date: 05/02/2017    Years since quitting: 5.0   Smokeless tobacco: Never  Vaping Use   Vaping Use: Never used  Substance and Sexual Activity   Alcohol use: No    Alcohol/week: 1.0 standard drink of alcohol    Types: 1 Glasses of wine per week    Comment: social   Drug use: No   Sexual activity: Never  Other Topics Concern   Not on file  Social History Narrative   Not on file  Social Determinants of Health   Financial Resource Strain: Not on file  Food Insecurity: Not on file  Transportation Needs: Not on file  Physical Activity: Not on file  Stress: Not on file  Social Connections: Not on file  Intimate Partner Violence: Not on file    Medications:   Current Outpatient Medications on File Prior to Visit  Medication Sig Dispense Refill   alendronate (FOSAMAX) 70 MG tablet Take 70 mg by mouth once a week.     amLODipine (NORVASC) 2.5 MG tablet Take 1 tablet (2.5 mg total) by mouth daily. 90 tablet 3   aspirin 325 MG tablet Take 1 tablet (325 mg total) by mouth daily. 30 tablet 0   atorvastatin (LIPITOR) 40 MG tablet Take 1 tablet (40 mg total) by mouth daily at 6 PM. 30 tablet 0   baclofen (LIORESAL) 10 MG tablet TAKE 1/2 TABLET BY MOUTH EVERY MORNING AND 1 TABLET EVERY NIGHT AT BEDTIME 45 tablet 5   Biotin 1000 MCG tablet Take  1,000 mcg by mouth daily.     hydrochlorothiazide (HYDRODIURIL) 12.5 MG tablet Take 12.5 mg by mouth every morning.     magnesium 30 MG tablet Take 250 mg by mouth 2 (two) times daily.     Melatonin 3 MG TABS Take 3 mg by mouth at bedtime.      polyethylene glycol (MIRALAX / GLYCOLAX) packet Take 17 g by mouth daily as needed (for constipation.).     pregabalin (LYRICA) 50 MG capsule TAKE 1 CAPSULE(50 MG) BY MOUTH TWICE DAILY 180 capsule 3   traZODone (DESYREL) 50 MG tablet Take 50 mg by mouth at bedtime. 1/2 tab at bedtime     triamcinolone cream (KENALOG) 0.5 % Apply 1 application topically 2 (two) times daily.     vitamin B-12 (CYANOCOBALAMIN) 1000 MCG tablet Take 1,000 mcg by mouth daily.     No current facility-administered medications on file prior to visit.    Allergies:  No Known Allergies  Physical Exam General: well developed, well nourished, seated, in no evident distress Head: head normocephalic and atraumatic.  Neck: supple with no carotid or supraclavicular bruits Cardiovascular: regular rate and rhythm, no murmurs Musculoskeletal: no deformity Skin:  no rash/petichiae Vascular:  Normal pulses all extremities Vitals:   05/08/22 1131  BP: 130/83  Pulse: 61   Neurologic Exam Mental Status: Awake and fully alert. Oriented to place and time. Recent and remote memory intact. Attention span, concentration and fund of knowledge appropriate. Mood and affect appropriate but did become tearful during appointment and was difficult for patient to stop.  Intact recall 3/3.  Able to name 12 animals that can walk on 4 legs.  Clock drawing 4/4. Cranial Nerves: Pupils equal, briskly reactive to light. Extraocular movements full without nystagmus. Visual fields full to confrontation. Hearing intact. Facial sensation intact. Mild left lower facial asymmetry, tongue, palate moves normally and symmetrically.  Motor: Spastic left hemiplegia with 2/5 left upper extremity proximal strength with  significant weakness of grip and intrinsic hand muscles. 3/5 strength of the left lower extremity with left ankle foot drop with 1/5 strength. Increased spasticity in left upper and lower extremities Sensory.:  Diminished sensation to touch ,pinprick .position and vibratory sensation.  On the left side Coordination: Rapid alternating movements normal in right upper and lower extremities. Finger-to-nose and heel-to-shin performed inaccurately on left side Gait and Station: Arises from chair with  difficulty. Stance is normal. Uses a cane. Has circumduction of left leg with foot  drop and foot drop brace in place.  Reflexes: 2+ and asymmetric and brisker on the left compared to the right Toes downgoing  NIHSS  5 Modified Rankin  4   ASSESSMENT:71 year lady with right basal ganglia large infarct of cryptogenic etiology in October 2018 with significant residual spastic left hemiplegia. Vascular risk factors of hypertension hyperlipidemia and smoking.  New complaints of persistent brain fog, dizziness and balance difficulties likely residual effects from old stroke. He complains of brain fog but did quite well on cognitive testing in the office today.    PLAN:I had a long discussion with the patient and her daughter about her complaints of brain fog and dizziness and imbalance.  I recommended t further evaluation by checking memory panel labs, EEG and she does not want Brain imaging at this time..  Also check lipid profile hemoglobin A1c.  I recommend that she get up slowly and avoid sudden movements.  I will also order a new ankle-foot orthotic device for better stability..  She was encouraged to use a cane at all times discussed fall precautions.  Continue aspirin 81 mg daily for stroke prevention and maintain aggressive risk factor modification with strict control of hypertension with blood pressure goal below 140/90 cholesterol goal below 70 mg and hemoglobin A1c goal below 6.5%.  She will return for  follow-up in the future in 6 months with my nurse practitioner or call earlier if necessary. Greater than 50% of time during this 35 minute visit was spent on counseling,explanation of diagnosis old stroke and spastic hemiplegia, planning of further management, discussion with patient and family and coordination of care Antony Contras, MD Note: This document was prepared with digital dictation and possible smart phrase technology. Any transcriptional errors that result from this process are unintentional

## 2022-05-09 LAB — DEMENTIA PANEL
Homocysteine: 10.6 umol/L (ref 0.0–17.2)
RPR Ser Ql: NONREACTIVE
TSH: 1.06 u[IU]/mL (ref 0.450–4.500)
Vitamin B-12: >2000 pg/mL — ABNORMAL HIGH (ref 232–1245)

## 2022-05-09 LAB — LIPID PANEL
Chol/HDL Ratio: 2 ratio (ref 0.0–4.4)
Cholesterol, Total: 124 mg/dL (ref 100–199)
HDL: 61 mg/dL (ref >39)
LDL Chol Calc (NIH): 50 mg/dL (ref 0–99)
Triglycerides: 58 mg/dL (ref 0–149)
VLDL Cholesterol Cal: 13 mg/dL (ref 5–40)

## 2022-05-09 LAB — HEMOGLOBIN A1C
Est. average glucose Bld gHb Est-mCnc: 108 mg/dL
Hgb A1c MFr Bld: 5.4 % (ref 4.8–5.6)

## 2022-05-15 ENCOUNTER — Other Ambulatory Visit: Payer: Medicare Other | Admitting: *Deleted

## 2022-05-17 NOTE — Progress Notes (Signed)
Kindly inform the patient that all lab results are satisfactory.  I sent a MyChart notification to the patient but she has not reviewed it

## 2022-05-22 ENCOUNTER — Telehealth: Payer: Self-pay | Admitting: Adult Health

## 2022-05-22 ENCOUNTER — Other Ambulatory Visit: Payer: Medicare Other | Admitting: *Deleted

## 2022-05-22 NOTE — Telephone Encounter (Signed)
LVM and sent mychart msg informing pt of need to reschedule EEG on 10/18 Kelly Lowe out

## 2022-05-23 DIAGNOSIS — F339 Major depressive disorder, recurrent, unspecified: Secondary | ICD-10-CM | POA: Diagnosis not present

## 2022-05-27 ENCOUNTER — Encounter
Payer: Medicare Other | Attending: Physical Medicine and Rehabilitation | Admitting: Physical Medicine and Rehabilitation

## 2022-05-27 ENCOUNTER — Encounter: Payer: Self-pay | Admitting: Physical Medicine and Rehabilitation

## 2022-05-27 VITALS — BP 134/78 | HR 76 | Ht 67.0 in | Wt 163.6 lb

## 2022-05-27 DIAGNOSIS — I69319 Unspecified symptoms and signs involving cognitive functions following cerebral infarction: Secondary | ICD-10-CM | POA: Diagnosis not present

## 2022-05-27 DIAGNOSIS — I69359 Hemiplegia and hemiparesis following cerebral infarction affecting unspecified side: Secondary | ICD-10-CM | POA: Insufficient documentation

## 2022-05-27 MED ORDER — NUEDEXTA 20-10 MG PO CAPS: 1.0000 | ORAL_CAPSULE | Freq: Every day | ORAL | 3 refills | Status: DC

## 2022-05-27 NOTE — Patient Instructions (Addendum)
Coenzyme Q10  Provided with list of supplements that can help with dyslipidemia: 1) Vitamin B3 500-4,'000mg'$  in divided doses daily (would recommend starting low as can cause uncomfortable facial flushing if started at too high a dose) 2) Phytosterols 2.15 grams daily 3) Fermented soy 30-50 grams daily 4) EGCG (found in green tea): 500-'1000mg'$  daily 5) Omega-3 fatty acids 3000-5,'000mg'$  daily 6) Flax seed 40 grams daily 7) Monounsaturated fats 20-40 grams daily (olives, olive oil, nuts), also reduces cardiovascular disease 8) Sesame: 40 grams daily 9) Gamma/delta tocotrienols- a family of unsaturated forms of Vitamin E- '200mg'$  with dinner 10) Pantethine '900mg'$  daily in divided doses 11) Resveratrol '250mg'$  daily 12) N Acetyl Cysteine '2000mg'$  daily in divided doses 13) Curcumin 2000-'5000mg'$  in divided doses daily 14) Pomegranate juice: 8 ounces daily, also helps to lower blood pressure 15) Pomegranate seeds one cup daily, also helps to lower blood pressure 16) Citrus Bergamot '1000mg'$  daily, also helps with glucose control and weight loss 17) Vitamin C '500mg'$  daily 18) Quercetin 500-'1000mg'$  daily 19) Glutathione 20) Probiotics 60-100 billion organisms per day 21) Fiber 22) Oats 23) Aged garlic (can eat as food or supplement of 600-'900mg'$  per day) 24) Chia seeds 25 grams per day 25) Lycopene- carotenoid found in high concentrations in tomatoes. 26) Alpha linolenic acid 27) Flavonoids and anthocyanins 28) Wogonin- flavanoid that enhances reverse cholesterol transport 29) Coenzyme Q10 30) Pantethine- derivative of Vitamin B5: '300mg'$  three times per day or '450mg'$  twice per day with or without food 31) Barley and other whole grains 32) Orange juice 33) L- carnitine 34) L- Lysine 35) L- Arginine 36) Almonds 37) Morin 38) Rutin 39) Carnosine 40) Histidine  41) Kaempferol  42) Organosulfur compounds 43) Vitamin E 44) Oleic acid 45) RBO (ferulic acid gammaoryzanol) 46) grape seed extract 47) Red  wine 48) Berberine HCL '500mg'$  daily or twice per day- more effective and with fewer adverse effects that ezetimibe monotherapy 49) red yeast rice 2400- 4800 mg/day 50) chlorella 51) Licorice   HTN: -Advised checking BP daily at home and logging results to bring into follow-up appointment with PCP and myself. -Reviewed BP meds today.  -Advised regarding healthy foods that can help lower blood pressure and provided with a list: 1) citrus foods- high in vitamins and minerals 2) salmon and other fatty fish - reduces inflammation and oxylipins 3) swiss chard (leafy green)- high level of nitrates 4) pumpkin seeds- one of the best natural sources of magnesium 5) Beans and lentils- high in fiber, magnesium, and potassium 6) Berries- high in flavonoids 7) Amaranth (whole grain, can be cooked similarly to rice and oats)- high in magnesium and fiber 8) Pistachios- even more effective at reducing BP than other nuts 9) Carrots- high in phenolic compounds that relax blood vessels and reduce inflammation 10) Celery- contain phthalides that relax tissues of arterial walls 11) Tomatoes- can also improve cholesterol and reduce risk of heart disease 12) Broccoli- good source of magnesium, calcium, and potassium 13) Greek yogurt: high in potassium and calcium 14) Herbs and spices: Celery seed, cilantro, saffron, lemongrass, black cumin, ginseng, cinnamon, cardamom, sweet basil, and ginger 15) Chia and flax seeds- also help to lower cholesterol and blood sugar 16) Beets- high levels of nitrates that relax blood vessels  17) spinach and bananas- high in potassium  -Provided lise of supplements that can help with hypertension:  1) magnesium: one high quality brand is Bioptemizers since it contains all 7 types of magnesium, otherwise over the counter magnesium gluconate '400mg'$  is a  good option 2) B vitamins 3) vitamin D 4) potassium 5) CoQ10 6) L-arginine 7) Vitamin C 8) Beetroot -Educated that goal BP is  120/80. -Made goal to incorporate some of the above foods into diet.

## 2022-05-27 NOTE — Progress Notes (Addendum)
Subjective:    Patient ID: Kelly Lowe, female    DOB: 03/02/1951, 71 y.o.   MRN: [id number]  HPI  Female with history of HTN, right foot fractures presents for follow for right basal ganglia infarct in 2018. She had weakness on the left side. They tried the SPRINT device, TENS unit, PT and OT, Botox treatments. They tried everything to get the arm and leg working at night.   Daughter supplements history. Patient is in [facility] therapies.  She is getting out more.    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 Leonie Man  -a new AFO was ordered for her.   -she is awaiting to hear from [facility].   -it had been years since she received her last AFO  The stroke affects her emotions and she had uncontrollable cruing  She has never damage to her mouth and has cotton mouth sensation. Feels numb, swollen, and burning all the same time  She wants to eat certain things because it's easier but may not always be healthier.   Battery went dead in loop recorder.   Has been at home living independently for a year.   She was doing therapy at one time but insurance stopped approving it.   Pain Inventory Average Pain 1 Pain Right Now 0 My pain is dull  In the last 24 hours, has pain interfered with the following? General activity 2 Relation with others 2 Enjoyment of life 2 What TIME of day is your pain at its worst? night Sleep (in general) Fair  Pain is worse with: some activites Pain improves with: rest and heat/ice Relief from Meds: 1   Family History  Problem Relation Age of Onset   Stroke Maternal Grandmother    Stroke Paternal Grandfather    Social History   Socioeconomic History   Marital status: Divorced    Spouse name: Not on file   Number of children: Not on file   Years of education: Not on file   Highest education level: Not on  file  Occupational History   Not on file  Tobacco Use   Smoking status: Former    Packs/day: 0.50    Years: 25.00    Total pack years: 12.50    Types: Cigarettes    Quit date: 05/02/2017    Years since quitting: 5.0   Smokeless tobacco: Never  Vaping Use   Vaping Use: Never used  Substance and Sexual Activity   Alcohol use: No    Alcohol/week: 1.0 standard drink of alcohol    Types: 1 Glasses of wine per week    Comment: social   Drug use: No   Sexual activity: Never  Other Topics Concern   Not on file  Social History Narrative   Not on file   Social Determinants of Health   Financial Resource Strain: Not on file  Food Insecurity: Not on file  Transportation Needs: Not on file  Physical Activity: Not on file  Stress: Not on file  Social Connections: Not on file   Past Surgical History:  Procedure Laterality Date   FOOT SURGERY     LOOP RECORDER INSERTION N/A 10/08/2017   Procedure: LOOP RECORDER INSERTION;  Surgeon: Deboraha Sprang, MD;  Location: [place] [facility];  Service: Cardiovascular;  Laterality: N/A;   TEE WITHOUT CARDIOVERSION N/A 05/08/2017  Procedure: TRANSESOPHAGEAL ECHOCARDIOGRAM (TEE);  Surgeon: Lelon Perla, MD;  Location: [facility] ENDOSCOPY;  Service: Cardiovascular;  Laterality: N/A;   Past Medical History:  Diagnosis Date   Hyperlipidemia    Hypertension    Stroke ([place])    There were no vitals taken for this visit.  Opioid Risk Score:   Fall Risk Score:  `1  Depression screen [facility] 2/9     10/25/2021   11:13 AM 03/22/2021    1:03 PM 11/09/2020    2:26 PM 04/13/2020   11:39 AM 01/04/2020   10:43 AM 12/30/2018    9:11 AM 04/09/2018   11:56 AM  Depression screen PHQ 2/9  Decreased Interest 0 0 0 3 3 0 0  Down, Depressed, Hopeless 0 0 0 3 3 0 0  PHQ - 2 Score 0 0 0 6 6 0 0    Review of Systems  Constitutional: Negative.   HENT: Negative.    Eyes: Negative.   Respiratory: Negative.    Cardiovascular: Negative.   Gastrointestinal:  Positive  for constipation.       Poor appetite  Endocrine: Negative.   Genitourinary:  Positive for urgency.  Musculoskeletal:  Positive for arthralgias.       Spasms, pain in left shoulder,left hand aching,  Skin:  Positive for rash.  Allergic/Immunologic: Negative.   Neurological:  Positive for weakness and numbness.       Tingling   Hematological: Negative.   Psychiatric/Behavioral:  Positive for confusion and dysphoric mood. The patient is nervous/anxious.   All other systems reviewed and are negative.    Objective:   Physical Exam  Gen: no distress, normal appearing, overweight BMI 25.62, wight 163 lbs HEENT: oral mucosa pink and moist, NCAT Cardio: Reg rate Chest: normal effort, normal rate of breathing Abd: soft, non-distended Ext: no edema Psych: pleasant, normal affect Skin: intact Neuro: Left shoulder subluxation, stable Gait: Not assessed today -in wheelchair Neurological: Alert Motor: LUE: 2+/5 shoulder abduction, elbow flex 3+/5, elbow ext 2-/5, hand grip 4-/5 with apraxia LLE: 4-/5 HF, 4/5 KE, ADF/PF 2/5  mAS: left shoulder abductors: 2/4, elbow flexors: 2/4, wrist flexors 3/4, finger flexors 3/4, thumb flexors 1/4 Left ADF/PF 1+/4 Follows commands.  Insight and awareness into deficits.  She knows phone numbers, dates, birthdays    Assessment & Plan:  Female with history of HTN, right foot fractures presents for follow-up for right basal ganglia infarct.    1.  Left spastic hemiparesis, fatigue, and functional deficits secondary to right basal ganglia infarct  Completed HH therapies.              No benefit with amantadine   Provided dietary education  Encouraged WHO/PRAFO  Dysport injections did not provide enough relief, referred for eval.   Discussed aquatherapy  Continue Baclofen to 10 qhs  Previously educated on limiting sling use, which patient is now doing resulting in decreased tone - cont ROM Discussed Bioness and vagal nerve stimulation Discussed  benefits of exercise.    -discussed new AFO.  Discussed that she failed Botox Discussed extracorporeal shockwave therapy as a modality for treatment. Discussed that the device looks and feels like a massage gun and I would move it over the area of pain for about 10 minutes. The device releases sound waves to the area of pain and helps to improve blood flow and circulation to improve the healing process. Discuss that this initially induces inflammation and can sometimes cause short-term increase in pain. Discussed that we typically  do three weekly treatments, but sometimes up to 6 if needed, and after 6 weeks long term benefits can sometimes be achieved. Discussed that this is an FDA approved device, but not covered by insurance and would cost $60 per session. Will scheduled patient for 6 consecutive appointments and can cancel latter three if benefits are achieved after first three sessions.   Continue stretching at home  2. Gait abnormality  Continue cane for safety   3. Sleep disturbance  Improved overall  4. Mood lability:             Patient, now benefiting with Psychology  Defer Cymbalta management to Psychiatry             Nudexta ordered by Neurology - but cannot afford  Continue following up with Psychiatry  Major limiting factor - continues to be at present  Continue to follow up with Psych  ?Improving  Continue trintelix.   5. Left shoulder subluxation with pain             Kinesio taping on hold due to reaction to tape             SPRINT placed on 9/5, removed on 11/6             Tramadol per PCP (educated on signs/symptoms serotonin syndrome)             Ortho consult ordered by Neurology NP - recommended therapies, which she completed  Prescribed Zynex heating/cooling blanket    6. Bony prominence right wrist             Xray with bony prominence             Voltaren gel in effective  Good benefits with injection on 9/26, patient elected to indefinitely postpone procedure  due to lack of pain today   7. Right hand numbness             Managed at present             Positional             Improved  8. Urinary urgency  Unlikely stroke related  Improving  9. Pseudodementia   See #4  Encouraged brain simulating activities  10. Dysesthesias in tongue  Did not respond to Gabapentin  Refilled Lyrica 50 BID with good benefit  Discussed interactions with other medications.   62. Former smoker: -commended on stopping smoking!  12. HTN: -BP is 116/74 today.  -Advised checking BP daily at home and logging results to bring into follow-up appointment with PCP and myself. -Reviewed BP meds today.  -Advised regarding healthy foods that can help lower blood pressure and provided with a list: 1) citrus foods- high in vitamins and minerals 2) salmon and other fatty fish - reduces inflammation and oxylipins 3) Lowe chard (leafy green)- high level of nitrates 4) pumpkin seeds- one of the best natural sources of magnesium 5) Beans and lentils- high in fiber, magnesium, and potassium 6) Berries- high in flavonoids 7) Amaranth (whole grain, can be cooked similarly to rice and oats)- high in magnesium and fiber 8) Pistachios- even more effective at reducing BP than other nuts 9) Carrots- high in phenolic compounds that relax blood vessels and reduce inflammation 10) Celery- contain phthalides that relax tissues of arterial walls 11) Tomatoes- can also improve cholesterol and reduce risk of heart disease 12) Broccoli- good source of magnesium, calcium, and potassium 13) Greek yogurt: high in potassium and calcium 14)  Herbs and spices: Celery seed, cilantro, saffron, lemongrass, black cumin, ginseng, cinnamon, cardamom, sweet basil, and ginger 15) Chia and flax seeds- also help to lower cholesterol and blood sugar 16) Beets- high levels of nitrates that relax blood vessels  17) spinach and bananas- high in potassium  -Provided lise of supplements that can help with  hypertension:  1) magnesium: one high quality brand is Bioptemizers since it contains all 7 types of magnesium, otherwise over the counter magnesium gluconate '400mg'$  is a good option 2) B vitamins 3) vitamin D 4) potassium 5) CoQ10 6) L-arginine 7) Vitamin C 8) Beetroot -Educated that goal BP is 120/80. -Made goal to incorporate some of the above foods into diet.    13) Constipation:  -Provided list of following foods that help with constipation and highlighted a few: 1) prunes- contain high amounts of fiber.  2) apples- has a form of dietary fiber called pectin that accelerates stool movement and increases beneficial gut bacteria 3) pears- in addition to fiber, also high in fructose and sorbitol which have laxative effect 4) figs- contain an enzyme ficin which helps to speed colonic transit 5) kiwis- contain an enzyme actinidin that improves gut motility and reduces constipation 6) oranges- rich in pectin (like apples) 7) grapefruits- contain a flavanol naringenin which has a laxative effect 8) vegetables- rich in fiber and also great sources of folate, vitamin C, and K 9) artichoke- high in inulin, prebiotic great for the microbiome 10) chicory- increases stool frequency and softness (can be added to coffee) 11) rhubarb- laxative effect 12) sweet potato- high fiber 13) beans, peas, and lentils- contain both soluble and insoluble fiber 14) chia seeds- improves intestinal health and gut flora 15) flaxseeds- laxative effect 16) whole grain rye bread- high in fiber 17) oat bran- high in soluble and insoluble fiber 18) kefir- softens stools -recommended to try at least one of these foods every day.  -drink 6-8 glasses of water per day -walk regularly, especially after meals.     14. Provided with list of supplements that can help with dyslipidemia: 1) Vitamin B3 500-4,'000mg'$  in divided doses daily (would recommend starting low as can cause uncomfortable facial flushing if started at  too high a dose) 2) Phytosterols 2.15 grams daily 3) Fermented soy 30-50 grams daily 4) EGCG (found in green tea): 500-'1000mg'$  daily 5) Omega-3 fatty acids 3000-5,'000mg'$  daily 6) Flax seed 40 grams daily 7) Monounsaturated fats 20-40 grams daily (olives, olive oil, nuts), also reduces cardiovascular disease 8) Sesame: 40 grams daily 9) Gamma/delta tocotrienols- a family of unsaturated forms of Vitamin E- '200mg'$  with dinner 10) Pantethine '900mg'$  daily in divided doses 11) Resveratrol '250mg'$  daily 12) N Acetyl Cysteine '2000mg'$  daily in divided doses 13) Curcumin 2000-'5000mg'$  in divided doses daily 14) Pomegranate juice: 8 ounces daily, also helps to lower blood pressure 15) Pomegranate seeds one cup daily, also helps to lower blood pressure 16) Citrus Bergamot '1000mg'$  daily, also helps with glucose control and weight loss 17) Vitamin C '500mg'$  daily 18) Quercetin 500-'1000mg'$  daily 19) Glutathione 20) Probiotics 60-100 billion organisms per day 21) Fiber 22) Oats 23) Aged garlic (can eat as food or supplement of 600-'900mg'$  per day) 24) Chia seeds 25 grams per day 25) Lycopene- carotenoid found in high concentrations in tomatoes. 26) Alpha linolenic acid 27) Flavonoids and anthocyanins 28) Wogonin- flavanoid that enhances reverse cholesterol transport 29) Coenzyme Q10 30) Pantethine- derivative of Vitamin B5: '300mg'$  three times per day or '450mg'$  twice per day with or without food  31) Barley and other whole grains 32) Orange juice 33) L- carnitine 34) L- Lysine 35) L- Arginine 36) Almonds 37) Morin 38) Rutin 39) Carnosine 40) Histidine  41) Kaempferol  42) Organosulfur compounds 43) Vitamin E 44) Oleic acid 45) RBO (ferulic acid gammaoryzanol) 46) grape seed extract 47) Red wine 48) Berberine HCL '500mg'$  daily or twice per day- more effective and with fewer adverse effects that ezetimibe monotherapy 49) red yeast rice 2400- 4800 mg/day 50) chlorella 51) Licorice   15. Cognitive  deficits, mild -discussed that she forgot she had an appointment today,  -discussed her positive performance on MOCA exam

## 2022-05-30 ENCOUNTER — Telehealth: Payer: Self-pay | Admitting: Physical Medicine and Rehabilitation

## 2022-06-10 ENCOUNTER — Telehealth: Payer: Self-pay | Admitting: Neurology

## 2022-06-10 ENCOUNTER — Other Ambulatory Visit: Payer: Medicare Other | Admitting: *Deleted

## 2022-06-10 NOTE — Telephone Encounter (Signed)
Pt daughter is calling. Stated she is following up on referral.

## 2022-06-10 NOTE — Telephone Encounter (Signed)
Returned daughter's call, left a vm letting her know I was returning and to give me a call back.

## 2022-06-13 ENCOUNTER — Ambulatory Visit (INDEPENDENT_AMBULATORY_CARE_PROVIDER_SITE_OTHER): Payer: Medicare Other | Admitting: Neurology

## 2022-06-13 DIAGNOSIS — R4189 Other symptoms and signs involving cognitive functions and awareness: Secondary | ICD-10-CM

## 2022-06-13 DIAGNOSIS — R569 Unspecified convulsions: Secondary | ICD-10-CM

## 2022-06-20 DIAGNOSIS — M810 Age-related osteoporosis without current pathological fracture: Secondary | ICD-10-CM | POA: Diagnosis not present

## 2022-06-20 DIAGNOSIS — F3341 Major depressive disorder, recurrent, in partial remission: Secondary | ICD-10-CM | POA: Diagnosis not present

## 2022-06-20 DIAGNOSIS — G4700 Insomnia, unspecified: Secondary | ICD-10-CM | POA: Diagnosis not present

## 2022-06-20 DIAGNOSIS — I10 Essential (primary) hypertension: Secondary | ICD-10-CM | POA: Diagnosis not present

## 2022-06-20 DIAGNOSIS — E785 Hyperlipidemia, unspecified: Secondary | ICD-10-CM | POA: Diagnosis not present

## 2022-06-20 NOTE — Progress Notes (Signed)
Kindly inform the patient that brainwave study EEG was normal.  No seizure activity noted

## 2022-06-29 ENCOUNTER — Encounter: Payer: Self-pay | Admitting: Physical Medicine and Rehabilitation

## 2022-07-05 ENCOUNTER — Telehealth: Payer: Self-pay | Admitting: *Deleted

## 2022-07-05 NOTE — Telephone Encounter (Signed)
Mrs Messer's daughter called about the orthotic for her mother--left foot. I think there is a MyChart message about it as well.

## 2022-07-16 DIAGNOSIS — Z23 Encounter for immunization: Secondary | ICD-10-CM | POA: Diagnosis not present

## 2022-07-18 ENCOUNTER — Telehealth: Payer: Self-pay

## 2022-07-18 NOTE — Telephone Encounter (Signed)
Kelly Lowe's daughter called about the orthotic for her mother--left foot again, following up on the script

## 2022-08-15 DIAGNOSIS — M810 Age-related osteoporosis without current pathological fracture: Secondary | ICD-10-CM | POA: Diagnosis not present

## 2022-08-15 DIAGNOSIS — I10 Essential (primary) hypertension: Secondary | ICD-10-CM | POA: Diagnosis not present

## 2022-08-15 DIAGNOSIS — G4700 Insomnia, unspecified: Secondary | ICD-10-CM | POA: Diagnosis not present

## 2022-08-15 DIAGNOSIS — E785 Hyperlipidemia, unspecified: Secondary | ICD-10-CM | POA: Diagnosis not present

## 2022-08-15 DIAGNOSIS — Z23 Encounter for immunization: Secondary | ICD-10-CM | POA: Diagnosis not present

## 2022-08-15 DIAGNOSIS — I679 Cerebrovascular disease, unspecified: Secondary | ICD-10-CM | POA: Diagnosis not present

## 2022-08-15 DIAGNOSIS — G8194 Hemiplegia, unspecified affecting left nondominant side: Secondary | ICD-10-CM | POA: Diagnosis not present

## 2022-10-04 DIAGNOSIS — D2372 Other benign neoplasm of skin of left lower limb, including hip: Secondary | ICD-10-CM | POA: Diagnosis not present

## 2022-10-04 DIAGNOSIS — M79672 Pain in left foot: Secondary | ICD-10-CM | POA: Diagnosis not present

## 2022-10-04 DIAGNOSIS — R2689 Other abnormalities of gait and mobility: Secondary | ICD-10-CM | POA: Diagnosis not present

## 2022-10-04 DIAGNOSIS — G608 Other hereditary and idiopathic neuropathies: Secondary | ICD-10-CM | POA: Diagnosis not present

## 2022-10-04 DIAGNOSIS — M21372 Foot drop, left foot: Secondary | ICD-10-CM | POA: Diagnosis not present

## 2022-10-04 DIAGNOSIS — B351 Tinea unguium: Secondary | ICD-10-CM | POA: Diagnosis not present

## 2022-10-04 DIAGNOSIS — M24572 Contracture, left ankle: Secondary | ICD-10-CM | POA: Diagnosis not present

## 2022-11-01 ENCOUNTER — Other Ambulatory Visit: Payer: Self-pay | Admitting: Physical Medicine and Rehabilitation

## 2022-11-05 NOTE — Telephone Encounter (Signed)
PMP was Reviewed.  Dr Ranell Patrick note was reviewed. Lyrica e-scribed to pharmacy

## 2022-11-06 ENCOUNTER — Telehealth: Payer: Self-pay

## 2022-11-06 NOTE — Telephone Encounter (Signed)
Refill request for Pregabalin 50 mg. Next appt 11/26/22. Please address in Dr. Aline August absence.

## 2022-11-06 NOTE — Telephone Encounter (Signed)
Prescription was sent on 11/05/2022

## 2022-11-12 ENCOUNTER — Ambulatory Visit: Payer: Medicare Other | Admitting: Adult Health

## 2022-11-17 ENCOUNTER — Other Ambulatory Visit: Payer: Self-pay | Admitting: Physical Medicine and Rehabilitation

## 2022-11-18 ENCOUNTER — Other Ambulatory Visit: Payer: Self-pay | Admitting: Physical Medicine and Rehabilitation

## 2022-11-26 ENCOUNTER — Encounter
Payer: Medicare Other | Attending: Physical Medicine and Rehabilitation | Admitting: Physical Medicine and Rehabilitation

## 2022-11-26 VITALS — BP 143/85 | HR 69 | Ht 67.0 in | Wt 155.0 lb

## 2022-11-26 DIAGNOSIS — F4321 Adjustment disorder with depressed mood: Secondary | ICD-10-CM | POA: Insufficient documentation

## 2022-11-26 DIAGNOSIS — F482 Pseudobulbar affect: Secondary | ICD-10-CM | POA: Diagnosis not present

## 2022-11-26 DIAGNOSIS — K5900 Constipation, unspecified: Secondary | ICD-10-CM | POA: Diagnosis not present

## 2022-11-26 DIAGNOSIS — G4701 Insomnia due to medical condition: Secondary | ICD-10-CM | POA: Diagnosis not present

## 2022-11-26 NOTE — Progress Notes (Addendum)
Subjective:    Patient ID: Kelly Lowe, female    DOB: Dec 21, 1950, 72 y.o.   MRN: [id number]  HPI  1) Pseudobulbar affect Kelly Lowe is 72 year old woman who presents with poststroke pseudobulbar affect.   Female with history of HTN, right foot fractures presents for follow for right basal ganglia infarct in 2018. She had weakness on the left side. They tried the SPRINT device, TENS unit, PT and OT, Botox treatments. They tried everything to get the arm and leg working at night.   -she does not want to do talk therapy  Daughter supplements history. Patient is in [facility] therapies.  She is getting out more.    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 Pearlean Brownie  -a new AFO was ordered for her.   -she is awaiting to hear from [facility].   -it had been years since she received her last AFO  The stroke affects her emotions and she had uncontrollable cruing  She has never damage to her mouth and has cotton mouth sensation. Feels numb, swollen, and burning all the same time  She wants to eat certain things because it's easier but may not always be healthier.   Battery went dead in loop recorder.   Has been at home living independently for a year.   She was doing therapy at one time but insurance stopped approving it.   2) Grief: -had a death from a close friend recently, and also lost her mother in 2013  -she followed with Dr. Kieth Brightly in the hospital   Pain Inventory Average Pain 1 Pain Right Now 0 My pain is dull  In the last 24 hours, has pain interfered with the following? General activity 2 Relation with others 2 Enjoyment of life 2 What TIME of day is your pain at its worst? night Sleep (in general) Fair  Pain is worse with: some activites Pain improves with: rest and heat/ice Relief from Meds: 1   Family History   Problem Relation Age of Onset   Stroke Maternal Grandmother    Stroke Paternal Grandfather    Social History   Socioeconomic History   Marital status: Divorced    Spouse name: Not on file   Number of children: Not on file   Years of education: Not on file   Highest education level: Not on file  Occupational History   Not on file  Tobacco Use   Smoking status: Former    Packs/day: 0.50    Years: 25.00    Additional pack years: 0.00    Total pack years: 12.50    Types: Cigarettes    Quit date: 05/02/2017    Years since quitting: 5.5   Smokeless tobacco: Never  Vaping Use   Vaping Use: Never used  Substance and Sexual Activity   Alcohol use: No    Alcohol/week: 1.0 standard drink of alcohol    Types: 1 Glasses of wine per week    Comment: social   Drug use: No   Sexual activity: Never  Other Topics Concern   Not on file  Social History Narrative   Not on file   Social Determinants of Health   Financial Resource Strain: Not on file  Food Insecurity: Not on file  Transportation Needs: Not on file  Physical Activity: Not on file  Stress: Not  on file  Social Connections: Not on file   Past Surgical History:  Procedure Laterality Date   FOOT SURGERY     LOOP RECORDER INSERTION N/A 10/08/2017   Procedure: LOOP RECORDER INSERTION;  Surgeon: Duke Salvia, MD;  Location: [facility] INVASIVE CV LAB;  Service: Cardiovascular;  Laterality: N/A;   TEE WITHOUT CARDIOVERSION N/A 05/08/2017   Procedure: TRANSESOPHAGEAL ECHOCARDIOGRAM (TEE);  Surgeon: Lewayne Bunting, MD;  Location: [facility] ENDOSCOPY;  Service: Cardiovascular;  Laterality: N/A;   Past Medical History:  Diagnosis Date   Hyperlipidemia    Hypertension    Stroke (HCC)    BP (!) 143/85   Pulse 69   Ht  (1.702 m)   Wt 155 lb (70.3 kg)   SpO2 94%   BMI 24.28 kg/m   Opioid Risk Score:   Fall Risk Score:  `1  Depression screen [facility] 2/9     10/25/2021   11:13 AM 03/22/2021    1:03 PM 11/09/2020    2:26 PM  04/13/2020   11:39 AM 01/04/2020   10:43 AM 12/30/2018    9:11 AM 04/09/2018   11:56 AM  Depression screen PHQ 2/9  Decreased Interest 0 0 0 3 3 0 0  Down, Depressed, Hopeless 0 0 0 3 3 0 0  PHQ - 2 Score 0 0 0 6 6 0 0    Review of Systems  Constitutional: Negative.   HENT: Negative.    Eyes: Negative.   Respiratory: Negative.    Cardiovascular: Negative.   Gastrointestinal:  Positive for constipation.       Poor appetite  Endocrine: Negative.   Genitourinary:  Positive for urgency.  Musculoskeletal:  Positive for arthralgias.       Spasms, pain in left shoulder,left hand aching, fingers elbow neck  Skin:  Positive for rash.  Allergic/Immunologic: Negative.   Neurological:  Positive for weakness and numbness.       Tingling   Hematological: Negative.   Psychiatric/Behavioral:  Positive for confusion and dysphoric mood. The patient is nervous/anxious.   All other systems reviewed and are negative.     Objective:   Physical Exam  Gen: no distress, normal appearing, overweight BMI 25.62, wight 163 lbs, BP 143/85 HEENT: oral mucosa pink and moist, NCAT Cardio: Reg rate Chest: normal effort, normal rate of breathing Abd: soft, non-distended Ext: no edema Psych: pleasant, normal affect Skin: intact Neuro: Left shoulder subluxation, stable Gait: Not assessed today -in wheelchair Neurological: Alert Motor: LUE: 2+/5 shoulder abduction, elbow flex 3+/5, elbow ext 2-/5, hand grip 4-/5 with apraxia LLE: 4-/5 HF, 4/5 KE, ADF/PF 2/5  mAS: left shoulder abductors: 2/4, elbow flexors: 2/4, wrist flexors 3/4, finger flexors 3/4, thumb flexors 1/4 Left ADF/PF 1+/4 Follows commands.  Insight and awareness into deficits.  She knows phone numbers, dates, birthdays    Assessment & Plan:  Female with history of HTN, right foot fractures presents for follow-up for right basal ganglia infarct.    1.  Left spastic hemiparesis, fatigue, and functional deficits secondary to right basal  ganglia infarct  Completed HH therapies. Discussed that this only seems to help while she is doing it.              No benefit with amantadine  Provided dietary education  Encouraged WHO/PRAFO  Dysport injections did not provide enough relief, referred for eval.   Discussed aquatherapy  Continue Baclofen to 10 qhs  Previously educated on limiting sling use, which patient is now doing resulting  in decreased tone - cont ROM Discussed Bioness and vagal nerve stimulation Discussed benefits of exercise.    -discussed new AFO.  Discussed that she failed Botox Discussed extracorporeal shockwave therapy as a modality for treatment. Discussed that the device looks and feels like a massage gun and I would move it over the area of pain for about 10 minutes. The device releases sound waves to the area of pain and helps to improve blood flow and circulation to improve the healing process. Discuss that this initially induces inflammation and can sometimes cause short-term increase in pain. Discussed that we typically do three weekly treatments, but sometimes up to 6 if needed, and after 6 weeks long term benefits can sometimes be achieved. Discussed that this is an FDA approved device, but not covered by insurance and would cost $60 per session. Will scheduled patient for 6 consecutive appointments and can cancel latter three if benefits are achieved after first three sessions.   Continue stretching at home  2. Gait abnormality  Continue cane for safety   3. Sleep disturbance  Improved overall  4. Mood lability:             Patient, now benefiting with Psychology  Defer Cymbalta management to Psychiatry             Nudexta ordered by Neurology - but cannot afford  Continue following up with Psychiatry  Major limiting factor - continues to be at present  Continue to follow up with Psych  ?Improving  Continue trintelix.   5. Left shoulder subluxation with pain             Kinesio taping on hold due  to reaction to tape             SPRINT placed on 9/5, removed on 11/6             Tramadol per PCP (educated on signs/symptoms serotonin syndrome)             Ortho consult ordered by Neurology NP - recommended therapies, which she completed  Prescribed Zynex heating/cooling blanket    6. Bony prominence right wrist             Xray with bony prominence             Voltaren gel in effective  Good benefits with injection on 9/26, patient elected to indefinitely postpone procedure due to lack of pain today   7. Right hand numbness             Managed at present             Positional             Improved  8. Urinary urgency  Unlikely stroke related  Improving  9. Pseudodementia   See #4  Encouraged brain simulating activities  10. Dysesthesias in tongue  Did not respond to Gabapentin  Refilled Lyrica 50 BID with good benefit  Discussed interactions with other medications.   11. Former smoker: -commended on stopping smoking!  12. HTN: -BP is 116/74 today.  -Advised checking BP daily at home and logging results to bring into follow-up appointment with PCP and myself. -Reviewed BP meds today.  -Advised regarding healthy foods that can help lower blood pressure and provided with a list: 1) citrus foods- high in vitamins and minerals 2) salmon and other fatty fish - reduces inflammation and oxylipins 3) swiss chard (leafy green)- high level of nitrates 4) pumpkin seeds- one  of the best natural sources of magnesium 5) Beans and lentils- high in fiber, magnesium, and potassium 6) Berries- high in flavonoids 7) Amaranth (whole grain, can be cooked similarly to rice and oats)- high in magnesium and fiber 8) Pistachios- even more effective at reducing BP than other nuts 9) Carrots- high in phenolic compounds that relax blood vessels and reduce inflammation 10) Celery- contain phthalides that relax tissues of arterial walls 11) Tomatoes- can also improve cholesterol and reduce risk  of heart disease 12) Broccoli- good source of magnesium, calcium, and potassium 13) Greek yogurt: high in potassium and calcium 14) Herbs and spices: Celery seed, cilantro, saffron, lemongrass, black cumin, ginseng, cinnamon, cardamom, sweet basil, and ginger 15) Chia and flax seeds- also help to lower cholesterol and blood sugar 16) Beets- high levels of nitrates that relax blood vessels  17) spinach and bananas- high in potassium  -Provided lise of supplements that can help with hypertension:  1) magnesium: one high quality brand is Bioptemizers since it contains all 7 types of magnesium, otherwise over the counter magnesium gluconate 400mg  is a good option 2) B vitamins 3) vitamin D 4) potassium 5) CoQ10 6) L-arginine 7) Vitamin C 8) Beetroot -Educated that goal BP is 120/80. -Made goal to incorporate some of the above foods into diet.    13) Constipation:  -continue magnesium, probiotic, colace, prune juice -made goal to eat 6 prunes per day -Provided list of following foods that help with constipation and highlighted a few: 1) prunes- contain high amounts of fiber.  2) apples- has a form of dietary fiber called pectin that accelerates stool movement and increases beneficial gut bacteria 3) pears- in addition to fiber, also high in fructose and sorbitol which have laxative effect 4) figs- contain an enzyme ficin which helps to speed colonic transit 5) kiwis- contain an enzyme actinidin that improves gut motility and reduces constipation 6) oranges- rich in pectin (like apples) 7) grapefruits- contain a flavanol naringenin which has a laxative effect 8) vegetables- rich in fiber and also great sources of folate, vitamin C, and K 9) artichoke- high in inulin, prebiotic great for the microbiome 10) chicory- increases stool frequency and softness (can be added to coffee) 11) rhubarb- laxative effect 12) sweet potato- high fiber 13) beans, peas, and lentils- contain both soluble  and insoluble fiber 14) chia seeds- improves intestinal health and gut flora 15) flaxseeds- laxative effect 16) whole grain rye bread- high in fiber 17) oat bran- high in soluble and insoluble fiber 18) kefir- softens stools -recommended to try at least one of these foods every day.  -drink 6-8 glasses of water per day -walk regularly, especially after meals.         14. Provided with list of supplements that can help with dyslipidemia: 1) Vitamin B3 500-4,000mg  in divided doses daily (would recommend starting low as can cause uncomfortable facial flushing if started at too high a dose) 2) Phytosterols 2.15 grams daily 3) Fermented soy 30-50 grams daily 4) EGCG (found in green tea): 500-1000mg  daily 5) Omega-3 fatty acids 3000-5,000mg  daily 6) Flax seed 40 grams daily 7) Monounsaturated fats 20-40 grams daily (olives, olive oil, nuts), also reduces cardiovascular disease 8) Sesame: 40 grams daily 9) Gamma/delta tocotrienols- a family of unsaturated forms of Vitamin E- 200mg  with dinner 10) Pantethine 900mg  daily in divided doses 11) Resveratrol 250mg  daily 12) N Acetyl Cysteine 2000mg  daily in divided doses 13) Curcumin 2000-5000mg  in divided doses daily 14) Pomegranate juice: 8 ounces daily,  also helps to lower blood pressure 15) Pomegranate seeds one cup daily, also helps to lower blood pressure 16) Citrus Bergamot 1000mg  daily, also helps with glucose control and weight loss 17) Vitamin C 500mg  daily 18) Quercetin 500-1000mg  daily 19) Glutathione 20) Probiotics 60-100 billion organisms per day 21) Fiber 22) Oats 23) Aged garlic (can eat as food or supplement of 600-900mg  per day) 24) Chia seeds 25 grams per day 25) Lycopene- carotenoid found in high concentrations in tomatoes. 26) Alpha linolenic acid 27) Flavonoids and anthocyanins 28) Wogonin- flavanoid that enhances reverse cholesterol transport 29) Coenzyme Q10 30) Pantethine- derivative of Vitamin B5: 300mg  three  times per day or 450mg  twice per day with or without food 31) Barley and other whole grains 32) Orange juice 33) L- carnitine 34) L- Lysine 35) L- Arginine 36) Almonds 37) Morin 38) Rutin 39) Carnosine 40) Histidine  41) Kaempferol  42) Organosulfur compounds 43) Vitamin E 44) Oleic acid 45) RBO (ferulic acid gammaoryzanol) 46) grape seed extract 47) Red wine 48) Berberine HCL 500mg  daily or twice per day- more effective and with fewer adverse effects that ezetimibe monotherapy 49) red yeast rice 2400- 4800 mg/day 50) chlorella 51) Licorice   15. Cognitive deficits, mild -discussed mechanism of action of low dose naltrexone as an opioid receptor antagonist which stimulates your body's production of its own natural endogenous opioids, helping to decrease pain. Discussed that it can also decrease T cell response and thus be helpful in decreasing inflammation, and symptoms of brain fog, fatigue, anxiety, depression, and allergies. Discussed that this medication needs to be compounded at a compounding pharmacy and can more expensive. Discussed that I usually start at 1mg  and if this is not providing enough relief then I titrate upward on a monthly basis.   -discussed her positive performance on MOCA exam -discussed modafinil  16. Dental decay -discussed that she had to have teeth pulled  18. Insomnia: -discussed the importance of getting sunlight exposure during the day. -has failed melatonin supplement  -continue trazodone    >40 minutes spent in review of chart, discussion of her insomnia, cognitive deficits, dental decay, the benefits of recent brace, her psuedobulbar affect, tryint 6 prunes per day for constipation, that she is unable to put on her brace by herself, that she had a foot brace at night that made her feel trapped, applesauce, avoidind constipating foods with white flour

## 2022-11-26 NOTE — Patient Instructions (Signed)
Constipation:  -Provided list of following foods that help with constipation and highlighted a few: 1) prunes- contain high amounts of fiber.  2) apples- has a form of dietary fiber called pectin that accelerates stool movement and increases beneficial gut bacteria 3) pears- in addition to fiber, also high in fructose and sorbitol which have laxative effect 4) figs- contain an enzyme ficin which helps to speed colonic transit 5) kiwis- contain an enzyme actinidin that improves gut motility and reduces constipation 6) oranges- rich in pectin (like apples) 7) grapefruits- contain a flavanol naringenin which has a laxative effect 8) vegetables- rich in fiber and also great sources of folate, vitamin C, and K 9) artichoke- high in inulin, prebiotic great for the microbiome 10) chicory- increases stool frequency and softness (can be added to coffee) 11) rhubarb- laxative effect 12) sweet potato- high fiber 13) beans, peas, and lentils- contain both soluble and insoluble fiber 14) chia seeds- improves intestinal health and gut flora 15) flaxseeds- laxative effect 16) whole grain rye bread- high in fiber 17) oat bran- high in soluble and insoluble fiber 18) kefir- softens stools -recommended to try at least one of these foods every day.  -drink 6-8 glasses of water per day -walk regularly, especially after meals.      

## 2022-12-03 ENCOUNTER — Telehealth: Payer: Self-pay | Admitting: *Deleted

## 2022-12-03 NOTE — Telephone Encounter (Signed)
Patient was seen last week. Her daughter is calling to follow up on [facility]. She was told that Dr. Carlis Abbott would speak with the rep and then follow up. Please call @ [phone] Festus Holts.

## 2022-12-04 NOTE — Telephone Encounter (Signed)
Left message for patient's daughter office still trying to locate contact information for rep ( Neu-dext[employer]).

## 2023-01-17 DIAGNOSIS — G608 Other hereditary and idiopathic neuropathies: Secondary | ICD-10-CM | POA: Diagnosis not present

## 2023-01-17 DIAGNOSIS — M79672 Pain in left foot: Secondary | ICD-10-CM | POA: Diagnosis not present

## 2023-01-17 DIAGNOSIS — M24572 Contracture, left ankle: Secondary | ICD-10-CM | POA: Diagnosis not present

## 2023-01-17 DIAGNOSIS — M21372 Foot drop, left foot: Secondary | ICD-10-CM | POA: Diagnosis not present

## 2023-01-17 DIAGNOSIS — D2372 Other benign neoplasm of skin of left lower limb, including hip: Secondary | ICD-10-CM | POA: Diagnosis not present

## 2023-01-17 DIAGNOSIS — R2689 Other abnormalities of gait and mobility: Secondary | ICD-10-CM | POA: Diagnosis not present

## 2023-01-17 DIAGNOSIS — B351 Tinea unguium: Secondary | ICD-10-CM | POA: Diagnosis not present

## 2023-02-10 DIAGNOSIS — I679 Cerebrovascular disease, unspecified: Secondary | ICD-10-CM | POA: Diagnosis not present

## 2023-02-10 DIAGNOSIS — E785 Hyperlipidemia, unspecified: Secondary | ICD-10-CM | POA: Diagnosis not present

## 2023-02-10 DIAGNOSIS — G4700 Insomnia, unspecified: Secondary | ICD-10-CM | POA: Diagnosis not present

## 2023-02-10 DIAGNOSIS — I10 Essential (primary) hypertension: Secondary | ICD-10-CM | POA: Diagnosis not present

## 2023-02-10 DIAGNOSIS — G8194 Hemiplegia, unspecified affecting left nondominant side: Secondary | ICD-10-CM | POA: Diagnosis not present

## 2023-02-10 DIAGNOSIS — Z1331 Encounter for screening for depression: Secondary | ICD-10-CM | POA: Diagnosis not present

## 2023-02-10 DIAGNOSIS — F3341 Major depressive disorder, recurrent, in partial remission: Secondary | ICD-10-CM | POA: Diagnosis not present

## 2023-02-10 DIAGNOSIS — Z0000 Encounter for general adult medical examination without abnormal findings: Secondary | ICD-10-CM | POA: Diagnosis not present

## 2023-02-13 ENCOUNTER — Other Ambulatory Visit: Payer: Self-pay | Admitting: Registered Nurse

## 2023-03-28 DIAGNOSIS — M79672 Pain in left foot: Secondary | ICD-10-CM | POA: Diagnosis not present

## 2023-03-28 DIAGNOSIS — R2689 Other abnormalities of gait and mobility: Secondary | ICD-10-CM | POA: Diagnosis not present

## 2023-03-28 DIAGNOSIS — G608 Other hereditary and idiopathic neuropathies: Secondary | ICD-10-CM | POA: Diagnosis not present

## 2023-03-28 DIAGNOSIS — M24572 Contracture, left ankle: Secondary | ICD-10-CM | POA: Diagnosis not present

## 2023-03-28 DIAGNOSIS — M21372 Foot drop, left foot: Secondary | ICD-10-CM | POA: Diagnosis not present

## 2023-03-28 DIAGNOSIS — D2372 Other benign neoplasm of skin of left lower limb, including hip: Secondary | ICD-10-CM | POA: Diagnosis not present

## 2023-03-28 DIAGNOSIS — B351 Tinea unguium: Secondary | ICD-10-CM | POA: Diagnosis not present

## 2023-05-20 ENCOUNTER — Encounter
Payer: Medicare Other | Attending: Physical Medicine and Rehabilitation | Admitting: Physical Medicine and Rehabilitation

## 2023-05-20 ENCOUNTER — Encounter: Payer: Self-pay | Admitting: Physical Medicine and Rehabilitation

## 2023-05-20 VITALS — BP 137/71 | HR 60 | Ht 67.0 in

## 2023-05-20 DIAGNOSIS — I6300 Cerebral infarction due to thrombosis of unspecified precerebral artery: Secondary | ICD-10-CM

## 2023-05-20 DIAGNOSIS — I69319 Unspecified symptoms and signs involving cognitive functions following cerebral infarction: Secondary | ICD-10-CM

## 2023-05-20 NOTE — Progress Notes (Addendum)
Subjective:    Patient ID: Kelly Lowe, female    DOB: [birth year], 72 y.o.   MRN: [id number]  HPI  1) Pseudobulbar affect Kelly Lowe is 72 year old woman who presents with poststroke pseudobulbar affect.   Female with history of HTN, right foot fractures presents for follow for right basal ganglia infarct in 2018. She had weakness on the left side. They tried the SPRINT device, TENS unit, PT and OT, Botox treatments. They tried everything to get the arm and leg working at night.   -she does not want to do talk therapy  Daughter supplements history. Patient is in [facility] therapies.  She is getting out more.    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 Pearlean Brownie  -a new AFO was ordered for her.   -she is awaiting to hear from [facility].   -it had been years since she received her last AFO  The stroke affects her emotions and she had uncontrollable cruing  She has never damage to her mouth and has cotton mouth sensation. Feels numb, swollen, and burning all the same time  She wants to eat certain things because it's easier but may not always be healthier.   Battery went dead in loop recorder.   Has been at home living independently for a year.   She was doing therapy at one time but insurance stopped approving it.   2) Grief: -had a death from a close friend recently, and also lost her mother in 2013  -she followed with Dr. Kieth Brightly in the hospital   3) CVA -living independently -home health aid comes three times per week  Pain Inventory Average Pain 1 Pain Right Now 0 My pain is dull  In the last 24 hours, has pain interfered with the following? General activity 2 Relation with others 2 Enjoyment of life 2 What TIME of day is your pain at its worst? night Sleep (in general) Fair  Pain is worse with: some activites Pain  improves with: rest and heat/ice Relief from Meds: 1   Family History  Problem Relation Age of Onset   Stroke Maternal Grandmother    Stroke Paternal Grandfather    Social History   Socioeconomic History   Marital status: Divorced    Spouse name: Not on file   Number of children: Not on file   Years of education: Not on file   Highest education level: Not on file  Occupational History   Not on file  Tobacco Use   Smoking status: Former    Current packs/day: 0.00    Average packs/day: 0.5 packs/day for 25.0 years (12.5 ttl pk-yrs)    Types: Cigarettes    Start date: 05/02/1992    Quit date: 05/02/2017    Years since quitting: 6.0   Smokeless tobacco: Never  Vaping Use   Vaping status: Never Used  Substance and Sexual Activity   Alcohol use: No    Alcohol/week: 1.0 standard drink of alcohol    Types: 1 Glasses of wine per week    Comment: social   Drug use: No   Sexual activity: Never  Other Topics Concern   Not on file  Social History Narrative   Not on file   Social Determinants of Health   Financial Resource Strain: Not on file  Food Insecurity: Not on file  Transportation Needs: Not on file  Physical Activity: Not on file  Stress: Not on file  Social Connections: Unknown (12/14/2021)   [facility] from [facility]   Social Network    Social Network: Not on file   Past Surgical History:  Procedure Laterality Date   FOOT SURGERY     LOOP RECORDER INSERTION N/A 10/08/2017   Procedure: LOOP RECORDER INSERTION;  Surgeon: Duke Salvia, MD;  Location: [facility] INVASIVE CV LAB;  Service: Cardiovascular;  Laterality: N/A;   TEE WITHOUT CARDIOVERSION N/A 05/08/2017   Procedure: TRANSESOPHAGEAL ECHOCARDIOGRAM (TEE);  Surgeon: Lewayne Bunting, MD;  Location: [facility] ENDOSCOPY;  Service: Cardiovascular;  Laterality: N/A;   Past Medical History:  Diagnosis Date   Hyperlipidemia    Hypertension    Stroke (HCC)    BP 137/71   Pulse 60   Ht 5\' 7"  (1.702 m)    SpO2 94%   BMI 24.28 kg/m   Opioid Risk Score:   Fall Risk Score:  `1  Depression screen [facility] 2/9     05/20/2023   10:47 AM 11/26/2022   11:20 AM 10/25/2021   11:13 AM 03/22/2021    1:03 PM 11/09/2020    2:26 PM 04/13/2020   11:39 AM 01/04/2020   10:43 AM  Depression screen PHQ 2/9  Decreased Interest 0 1 0 0 0 3 3  Down, Depressed, Hopeless 0 0 0 0 0 3 3  PHQ - 2 Score 0 1 0 0 0 6 6    Review of Systems  Constitutional: Negative.   HENT: Negative.    Eyes: Negative.   Respiratory: Negative.    Cardiovascular: Negative.   Gastrointestinal:  Positive for constipation.       Poor appetite  Endocrine: Negative.   Genitourinary:  Positive for urgency.  Musculoskeletal:  Positive for arthralgias.       Spasms, pain in left shoulder,left hand aching, fingers elbow neck  Skin:  Positive for rash.  Allergic/Immunologic: Negative.   Neurological:  Positive for weakness and numbness.       Tingling   Hematological: Negative.   Psychiatric/Behavioral:  Positive for confusion and dysphoric mood. The patient is nervous/anxious.   All other systems reviewed and are negative.     Objective:   Physical Exam  Gen: no distress, normal appearing, overweight BMI 25.62, wight 155 lbs lbs, BP 137/71 HEENT: oral mucosa pink and moist, NCAT Cardio: Reg rate Chest: normal effort, normal rate of breathing Abd: soft, non-distended Ext: no edema Psych: pleasant, normal affect, bright and positive affect Skin: intact Neuro: Left shoulder subluxation, stable Gait: Not assessed today -in wheelchair Neurological: Alert Motor: LUE: 2+/5 shoulder abduction, elbow flex 3+/5, elbow ext 2-/5, hand grip 4-/5 with apraxia LLE: 4-/5 HF, 4/5 KE, ADF/PF 2/5  mAS: left shoulder abductors: 2/4, elbow flexors: 2/4, wrist flexors 3/4, finger flexors 3/4, thumb flexors 1/4 Left ADF/PF 1+/4 Exam stable 10/15 Follows commands.  Insight and awareness into deficits.  She knows phone numbers, dates, birthdays     Assessment & Plan:  Female with history of HTN, right foot fractures presents for follow-up for right basal ganglia infarct.    1.  Left spastic hemiparesis, fatigue, and functional deficits secondary to right basal ganglia infarct  Completed HH therapies. Discussed that this only seems to help while she is doing it.              No benefit with amantadine  Prescribed home health  Discussed disability.  Provided dietary education  Encouraged WHO/PRAFO  Dysport injections did not provide enough relief, referred for eval.   Discussed aquatherapy  Continue Baclofen to 10 qhs  Previously educated on limiting sling use, which patient is now doing resulting in decreased tone - cont ROM Discussed Bioness and vagal nerve stimulation Discussed benefits of exercise.    -discussed new AFO.  Discussed that she failed Botox Discussed extracorporeal shockwave therapy as a modality for treatment. Discussed that the device looks and feels like a massage gun and I would move it over the area of pain for about 10 minutes. The device releases sound waves to the area of pain and helps to improve blood flow and circulation to improve the healing process. Discuss that this initially induces inflammation and can sometimes cause short-term increase in pain. Discussed that we typically do three weekly treatments, but sometimes up to 6 if needed, and after 6 weeks long term benefits can sometimes be achieved. Discussed that this is an FDA approved device, but not covered by insurance and would cost $60 per session. Will scheduled patient for 6 consecutive appointments and can cancel latter three if benefits are achieved after first three sessions.   Continue stretching at home  2. Gait abnormality  Continue cane for safety   3. Sleep disturbance  Improved overall  4. Mood lability:             Patient, now benefiting with Psychology  Defer Cymbalta management to Psychiatry             Nudexta ordered by  Neurology - but cannot afford  Continue following up with Psychiatry  Major limiting factor - continues to be at present  Continue to follow up with Psych  ?Improving  Continue trintelix.   5. Left shoulder subluxation with pain             Kinesio taping on hold due to reaction to tape             SPRINT placed on 9/5, removed on 11/6             Tramadol per PCP (educated on signs/symptoms serotonin syndrome)             Ortho consult ordered by Neurology NP - recommended therapies, which she completed  Prescribed Zynex heating/cooling blanket    6. Bony prominence right wrist             Xray with bony prominence             Voltaren gel in effective  Good benefits with injection on 9/26, patient elected to indefinitely postpone procedure due to lack of pain today   7. Right hand numbness             Managed at present             Positional             Improved  8. Urinary urgency  Unlikely stroke related  Improving  9. Pseudodementia   See #4  Encouraged brain simulating activities  10. Dysesthesias in tongue  Did not respond to Gabapentin  Refilled Lyrica 50 BID with good benefit  Discussed interactions with other medications.   11. Former smoker: -commended on stopping smoking! Discussed that she has continued to stop smoking.   12. HTN: -BP is 137/71 -continue amlodipine 2.5mg  daily and HCTZ -Advised checking BP daily at home and logging results to bring into follow-up appointment with PCP and  myself. -Reviewed BP meds today.  -Advised regarding healthy foods that can help lower blood pressure and provided with a list: 1) citrus foods- high in vitamins and minerals 2) salmon and other fatty fish - reduces inflammation and oxylipins 3) swiss chard (leafy green)- high level of nitrates 4) pumpkin seeds- one of the best natural sources of magnesium 5) Beans and lentils- high in fiber, magnesium, and potassium 6) Berries- high in flavonoids 7) Amaranth (whole  grain, can be cooked similarly to rice and oats)- high in magnesium and fiber 8) Pistachios- even more effective at reducing BP than other nuts 9) Carrots- high in phenolic compounds that relax blood vessels and reduce inflammation 10) Celery- contain phthalides that relax tissues of arterial walls 11) Tomatoes- can also improve cholesterol and reduce risk of heart disease 12) Broccoli- good source of magnesium, calcium, and potassium 13) Greek yogurt: high in potassium and calcium 14) Herbs and spices: Celery seed, cilantro, saffron, lemongrass, black cumin, ginseng, cinnamon, cardamom, sweet basil, and ginger 15) Chia and flax seeds- also help to lower cholesterol and blood sugar 16) Beets- high levels of nitrates that relax blood vessels  17) spinach and bananas- high in potassium  -Provided lise of supplements that can help with hypertension:  1) magnesium: one high quality brand is Bioptemizers since it contains all 7 types of magnesium, otherwise over the counter magnesium gluconate 400mg  is a good option 2) B vitamins 3) vitamin D 4) potassium 5) CoQ10 6) L-arginine 7) Vitamin C 8) Beetroot -Educated that goal BP is 120/80. -Made goal to incorporate some of the above foods into diet.    13) Constipation:  -continue magnesium, probiotic, colace, prune juice -made goal to eat 6 prunes per day -Provided list of following foods that help with constipation and highlighted a few: 1) prunes- contain high amounts of fiber.  2) apples- has a form of dietary fiber called pectin that accelerates stool movement and increases beneficial gut bacteria 3) pears- in addition to fiber, also high in fructose and sorbitol which have laxative effect 4) figs- contain an enzyme ficin which helps to speed colonic transit 5) kiwis- contain an enzyme actinidin that improves gut motility and reduces constipation 6) oranges- rich in pectin (like apples) 7) grapefruits- contain a flavanol naringenin  which has a laxative effect 8) vegetables- rich in fiber and also great sources of folate, vitamin C, and K 9) artichoke- high in inulin, prebiotic great for the microbiome 10) chicory- increases stool frequency and softness (can be added to coffee) 11) rhubarb- laxative effect 12) sweet potato- high fiber 13) beans, peas, and lentils- contain both soluble and insoluble fiber 14) chia seeds- improves intestinal health and gut flora 15) flaxseeds- laxative effect 16) whole grain rye bread- high in fiber 17) oat bran- high in soluble and insoluble fiber 18) kefir- softens stools -recommended to try at least one of these foods every day.  -drink 6-8 glasses of water per day -walk regularly, especially after meals.         14. Provided with list of supplements that can help with dyslipidemia: 1) Vitamin B3 500-4,000mg  in divided doses daily (would recommend starting low as can cause uncomfortable facial flushing if started at too high a dose) 2) Phytosterols 2.15 grams daily 3) Fermented soy 30-50 grams daily 4) EGCG (found in green tea): 500-1000mg  daily 5) Omega-3 fatty acids 3000-5,000mg  daily 6) Flax seed 40 grams daily 7) Monounsaturated fats 20-40 grams daily (olives, olive oil, nuts), also reduces cardiovascular  disease 8) Sesame: 40 grams daily 9) Gamma/delta tocotrienols- a family of unsaturated forms of Vitamin E- 200mg  with dinner 10) Pantethine 900mg  daily in divided doses 11) Resveratrol 250mg  daily 12) N Acetyl Cysteine 2000mg  daily in divided doses 13) Curcumin 2000-5000mg  in divided doses daily 14) Pomegranate juice: 8 ounces daily, also helps to lower blood pressure 15) Pomegranate seeds one cup daily, also helps to lower blood pressure 16) Citrus Bergamot 1000mg  daily, also helps with glucose control and weight loss 17) Vitamin C 500mg  daily 18) Quercetin 500-1000mg  daily 19) Glutathione 20) Probiotics 60-100 billion organisms per day 21) Fiber 22) Oats 23)  Aged garlic (can eat as food or supplement of 600-900mg  per day) 24) Chia seeds 25 grams per day 25) Lycopene- carotenoid found in high concentrations in tomatoes. 26) Alpha linolenic acid 27) Flavonoids and anthocyanins 28) Wogonin- flavanoid that enhances reverse cholesterol transport 29) Coenzyme Q10 30) Pantethine- derivative of Vitamin B5: 300mg  three times per day or 450mg  twice per day with or without food 31) Barley and other whole grains 32) Orange juice 33) L- carnitine 34) L- Lysine 35) L- Arginine 36) Almonds 37) Morin 38) Rutin 39) Carnosine 40) Histidine  41) Kaempferol  42) Organosulfur compounds 43) Vitamin E 44) Oleic acid 45) RBO (ferulic acid gammaoryzanol) 46) grape seed extract 47) Red wine 48) Berberine HCL 500mg  daily or twice per day- more effective and with fewer adverse effects that ezetimibe monotherapy 49) red yeast rice 2400- 4800 mg/day 50) chlorella 51) Licorice   15. Cognitive deficits, mild/brain fog: -discussed mechanism of action of low dose naltrexone as an opioid receptor antagonist which stimulates your body's production of its own natural endogenous opioids, helping to decrease pain. Discussed that it can also decrease T cell response and thus be helpful in decreasing inflammation, and symptoms of brain fog, fatigue, anxiety, depression, and allergies. Discussed that this medication needs to be compounded at a compounding pharmacy and can more expensive. Discussed that I usually start at 1mg  and if this is not providing enough relief then I titrate upward on a monthly basis.   -will call in LDN to [facility]  -discussed her positive performance on MOCA exam -discussed modafinil -discussed that she continues to have short attention span -discussed that lyrica and baclofen can contribute -discussed impaired memory -discussed vascular dementia -encouraged eating fruits and vegetables  16. Dental decay -discussed that she had to  have teeth pulled  18. Insomnia: -discussed the importance of getting sunlight exposure during the day. -has failed melatonin supplement  -continue trazodone

## 2023-05-27 ENCOUNTER — Ambulatory Visit: Payer: Medicare Other | Admitting: Physical Medicine and Rehabilitation

## 2023-06-03 ENCOUNTER — Other Ambulatory Visit: Payer: Self-pay | Admitting: Physical Medicine and Rehabilitation

## 2023-06-04 DIAGNOSIS — Z23 Encounter for immunization: Secondary | ICD-10-CM | POA: Diagnosis not present

## 2023-06-09 DIAGNOSIS — G608 Other hereditary and idiopathic neuropathies: Secondary | ICD-10-CM | POA: Diagnosis not present

## 2023-06-09 DIAGNOSIS — M24572 Contracture, left ankle: Secondary | ICD-10-CM | POA: Diagnosis not present

## 2023-06-09 DIAGNOSIS — B351 Tinea unguium: Secondary | ICD-10-CM | POA: Diagnosis not present

## 2023-06-09 DIAGNOSIS — M79672 Pain in left foot: Secondary | ICD-10-CM | POA: Diagnosis not present

## 2023-06-09 DIAGNOSIS — R2689 Other abnormalities of gait and mobility: Secondary | ICD-10-CM | POA: Diagnosis not present

## 2023-06-09 DIAGNOSIS — D2372 Other benign neoplasm of skin of left lower limb, including hip: Secondary | ICD-10-CM | POA: Diagnosis not present

## 2023-06-09 DIAGNOSIS — M21372 Foot drop, left foot: Secondary | ICD-10-CM | POA: Diagnosis not present

## 2023-08-24 DIAGNOSIS — M21372 Foot drop, left foot: Secondary | ICD-10-CM | POA: Diagnosis not present

## 2023-08-24 DIAGNOSIS — M79672 Pain in left foot: Secondary | ICD-10-CM | POA: Diagnosis not present

## 2023-08-24 DIAGNOSIS — B351 Tinea unguium: Secondary | ICD-10-CM | POA: Diagnosis not present

## 2023-08-24 DIAGNOSIS — G608 Other hereditary and idiopathic neuropathies: Secondary | ICD-10-CM | POA: Diagnosis not present

## 2023-08-24 DIAGNOSIS — M24572 Contracture, left ankle: Secondary | ICD-10-CM | POA: Diagnosis not present

## 2023-08-24 DIAGNOSIS — D2372 Other benign neoplasm of skin of left lower limb, including hip: Secondary | ICD-10-CM | POA: Diagnosis not present

## 2023-08-24 DIAGNOSIS — R2689 Other abnormalities of gait and mobility: Secondary | ICD-10-CM | POA: Diagnosis not present

## 2023-08-26 DIAGNOSIS — K146 Glossodynia: Secondary | ICD-10-CM | POA: Diagnosis not present

## 2023-08-26 DIAGNOSIS — Z23 Encounter for immunization: Secondary | ICD-10-CM | POA: Diagnosis not present

## 2023-08-26 DIAGNOSIS — I679 Cerebrovascular disease, unspecified: Secondary | ICD-10-CM | POA: Diagnosis not present

## 2023-08-26 DIAGNOSIS — G8194 Hemiplegia, unspecified affecting left nondominant side: Secondary | ICD-10-CM | POA: Diagnosis not present

## 2023-08-26 DIAGNOSIS — E785 Hyperlipidemia, unspecified: Secondary | ICD-10-CM | POA: Diagnosis not present

## 2023-08-26 DIAGNOSIS — G4700 Insomnia, unspecified: Secondary | ICD-10-CM | POA: Diagnosis not present

## 2023-08-26 DIAGNOSIS — I10 Essential (primary) hypertension: Secondary | ICD-10-CM | POA: Diagnosis not present

## 2023-08-26 DIAGNOSIS — I6523 Occlusion and stenosis of bilateral carotid arteries: Secondary | ICD-10-CM | POA: Diagnosis not present

## 2023-08-26 DIAGNOSIS — F4321 Adjustment disorder with depressed mood: Secondary | ICD-10-CM | POA: Diagnosis not present

## 2023-10-17 DIAGNOSIS — D2372 Other benign neoplasm of skin of left lower limb, including hip: Secondary | ICD-10-CM | POA: Diagnosis not present

## 2023-10-17 DIAGNOSIS — R2689 Other abnormalities of gait and mobility: Secondary | ICD-10-CM | POA: Diagnosis not present

## 2023-10-17 DIAGNOSIS — M21372 Foot drop, left foot: Secondary | ICD-10-CM | POA: Diagnosis not present

## 2023-10-17 DIAGNOSIS — G608 Other hereditary and idiopathic neuropathies: Secondary | ICD-10-CM | POA: Diagnosis not present

## 2023-10-17 DIAGNOSIS — M24572 Contracture, left ankle: Secondary | ICD-10-CM | POA: Diagnosis not present

## 2023-10-17 DIAGNOSIS — M79672 Pain in left foot: Secondary | ICD-10-CM | POA: Diagnosis not present

## 2023-10-17 DIAGNOSIS — B351 Tinea unguium: Secondary | ICD-10-CM | POA: Diagnosis not present

## 2023-10-28 DIAGNOSIS — L309 Dermatitis, unspecified: Secondary | ICD-10-CM | POA: Diagnosis not present

## 2023-10-28 DIAGNOSIS — J3089 Other allergic rhinitis: Secondary | ICD-10-CM | POA: Diagnosis not present

## 2023-11-18 ENCOUNTER — Encounter: Payer: Self-pay | Admitting: Physical Medicine and Rehabilitation

## 2023-11-18 ENCOUNTER — Encounter
Payer: Medicare Other | Attending: Physical Medicine and Rehabilitation | Admitting: Physical Medicine and Rehabilitation

## 2023-11-18 VITALS — BP 152/85 | HR 62 | Ht 67.0 in | Wt 166.0 lb

## 2023-11-18 DIAGNOSIS — R252 Cramp and spasm: Secondary | ICD-10-CM | POA: Diagnosis not present

## 2023-11-18 DIAGNOSIS — I639 Cerebral infarction, unspecified: Secondary | ICD-10-CM | POA: Insufficient documentation

## 2023-11-18 DIAGNOSIS — F482 Pseudobulbar affect: Secondary | ICD-10-CM | POA: Diagnosis not present

## 2023-11-18 DIAGNOSIS — I69319 Unspecified symptoms and signs involving cognitive functions following cerebral infarction: Secondary | ICD-10-CM | POA: Insufficient documentation

## 2023-11-18 NOTE — Progress Notes (Signed)
 Subjective:    Patient ID: Kelly Lowe, female    DOB: 1951/06/18, 73 y.o.   MRN: [id number]  HPI  1) Pseudobulbar affect Kelly Lowe is 73 year old woman who presents with poststroke pseudobulbar affect.   Female with history of HTN, right foot fractures presents for follow for right basal ganglia infarct in 2018. She had weakness on the left side. They tried the SPRINT device, TENS unit, PT and OT, Botox treatments. They tried everything to get the arm and leg working at night.   -she does not want to do talk therapy  Daughter supplements history. Patient is in [facility] therapies.  She is getting out more.    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 Pearlean Brownie  -a new AFO was ordered for her.   -has been better  -she is awaiting to hear from Kelly Lowe.   -it had been years since she received her last AFO  The stroke affects her emotions and she had uncontrollable cruing  She has never damage to her mouth and has cotton mouth sensation. Feels numb, swollen, and burning all the same time  She wants to eat certain things because it's easier but may not always be healthier.   Battery went dead in loop recorder.   Has been at home living independently for a year.   She was doing therapy at one time but insurance stopped approving it.   2) Grief: -had a death from a close friend recently, and also lost her mother in 2013  -she followed with Dr. Kieth Brightly in the hospital   3) CVA -living independently -home health aid comes three times per week  4) Brain fog: -she feels like this has improved wit LDN  5) overweight: -she has been trying to lose weight   Pain Inventory Average Pain 0 Pain Right Now 0 My pain is  heavy & stiffness in left limbs  upper & lower, brain fog  In the last 24 hours, has pain interfered with the  following? General activity 8 Relation with others 0 Enjoyment of life 0 What TIME of day is your pain at its worst?  All the time but worst in the morning Sleep (in general) Fair  Pain is worse with:  nothing Pain improves with:  nothing Relief from Meds: 0   Family History  Problem Relation Age of Onset   Stroke Maternal Grandmother    Stroke Paternal Grandfather    Social History   Socioeconomic History   Marital status: Divorced    Spouse name: Not on file   Number of children: Not on file   Years of education: Not on file   Highest education level: Not on file  Occupational History   Not on file  Tobacco Use   Smoking status: Former    Current packs/day: 0.00    Average packs/day: 0.5 packs/day for 25.0 years (12.5 ttl pk-yrs)    Types: Cigarettes    Start date: 05/02/1992    Quit date: 05/02/2017    Years since quitting: 6.5   Smokeless tobacco: Never  Vaping Use   Vaping status: Never Used  Substance and Sexual Activity   Alcohol use: No    Alcohol/week: 1.0 standard drink of alcohol    Types: 1 Glasses of wine per week    Comment: social   Drug  use: No   Sexual activity: Never  Other Topics Concern   Not on file  Social History Narrative   Not on file   Social Drivers of Health   Financial Resource Strain: Not on file  Food Insecurity: Not on file  Transportation Needs: Not on file  Physical Activity: Not on file  Stress: Not on file  Social Connections: Unknown (12/14/2021)   Received from Kelly Lowe, [facility]   Social Network    Social Network: Not on file   Past Surgical History:  Procedure Laterality Date   FOOT SURGERY     LOOP RECORDER INSERTION N/A 10/08/2017   Procedure: LOOP RECORDER INSERTION;  Surgeon: Kelly Goodwill, MD;  Location: [facility] INVASIVE CV LAB;  Service: Cardiovascular;  Laterality: N/A;   TEE WITHOUT CARDIOVERSION N/A 05/08/2017   Procedure: TRANSESOPHAGEAL ECHOCARDIOGRAM (TEE);  Surgeon: Kelly Quince, MD;   Location: [facility] ENDOSCOPY;  Service: Cardiovascular;  Laterality: N/A;   Past Medical History:  Diagnosis Date   Hyperlipidemia    Hypertension    Stroke (HCC)    BP (!) 152/85   Pulse 62   Ht 5\' 7"  (1.702 m)   SpO2 95%   BMI 24.28 kg/m   Opioid Risk Score:   Fall Risk Score:  `1  Depression screen [facility] 2/9     11/18/2023   10:44 AM 05/20/2023   10:47 AM 11/26/2022   11:20 AM 10/25/2021   11:13 AM 03/22/2021    1:03 PM 11/09/2020    2:26 PM 04/13/2020   11:39 AM  Depression screen PHQ 2/9  Decreased Interest 0 0 1 0 0 0 3  Down, Depressed, Hopeless 0 0 0 0 0 0 3  PHQ - 2 Score 0 0 1 0 0 0 6    Review of Systems  Constitutional: Negative.   HENT: Negative.    Eyes: Negative.   Respiratory: Negative.    Cardiovascular: Negative.   Gastrointestinal:  Positive for constipation.       Poor appetite  Endocrine: Negative.   Genitourinary:  Positive for urgency.  Musculoskeletal:  Positive for arthralgias.       Spasms, pain in left shoulder,left hand aching, fingers elbow neck  Skin:  Positive for rash.  Allergic/Immunologic: Negative.   Neurological:  Positive for weakness and numbness.       Tingling   Hematological: Negative.   Psychiatric/Behavioral:  Positive for confusion and dysphoric mood. The patient is nervous/anxious.   All other systems reviewed and are negative.     Objective:   Physical Exam  Gen: no distress, normal appearing, overweight BMI 25.62, wight 155 lbs lbs, BP 137/71 HEENT: oral mucosa pink and moist, NCAT Cardio: Reg rate Chest: normal effort, normal rate of breathing Abd: soft, non-distended Ext: no edema Psych: pleasant, normal affect, bright and positive affect Skin: intact Neuro: Left shoulder subluxation, stable Gait: Not assessed today -in wheelchair Neurological: Alert Motor: LUE: 2+/5 shoulder abduction, elbow flex 3+/5, elbow ext 2-/5, hand grip 4-/5 with apraxia LLE: 4-/5 HF, 4/5 KE, ADF/PF 2/5  mAS: left shoulder abductors:  2/4, elbow flexors: 2/4, wrist flexors 3/4, finger flexors 3/4, thumb flexors 1/4 Left ADF/PF 1+/4 exam stable 4/15 Follows commands.  Insight and awareness into deficits.  She knows phone numbers, dates, birthdays    Assessment & Plan:  Female with history of HTN, right foot fractures presents for follow-up for right basal ganglia infarct.    1.  Left spastic hemiparesis, fatigue, and functional deficits secondary  to right basal ganglia infarct  Completed HH therapies. Discussed that this only seems to help while she is doing it.              No benefit with amantadine  Prescribed home health  Discussed disability.   Discussed wean off baclofen  Provided dietary education  Encouraged WHO/PRAFO, discussed that she was unable to get it on properly  Dysport injections did not provide enough relief, referred for eval.   Discussed aquatherapy  Continue Baclofen to 10 qhs  Previously educated on limiting sling use, which patient is now doing resulting in decreased tone - cont ROM Discussed Bioness and vagal nerve stimulation Discussed benefits of exercise.    -discussed new AFO.  Discussed that she failed Botox Discussed extracorporeal shockwave therapy as a modality for treatment. Discussed that the device looks and feels like a massage gun and I would move it over the area of pain for about 10 minutes. The device releases sound waves to the area of pain and helps to improve blood flow and circulation to improve the healing process. Discuss that this initially induces inflammation and can sometimes cause short-term increase in pain. Discussed that we typically do three weekly treatments, but sometimes up to 6 if needed, and after 6 weeks long term benefits can sometimes be achieved. Discussed that this is an FDA approved device, but not covered by insurance and would cost $60 per session. Will scheduled patient for 6 consecutive appointments and can cancel latter three if benefits are achieved  after first three sessions.   Continue stretching at home  2. Gait abnormality  Continue cane for safety   3. Sleep disturbance  Improved overall  4. Mood lability:             Patient, now benefiting with Psychology  Defer Cymbalta management to Psychiatry             Nudexta ordered by Neurology - but cannot afford  Continue following up with Psychiatry  Major limiting factor - continues to be at present  Continue to follow up with Psych  ?Improving  Continue trintelix.   5. Left shoulder subluxation with pain             Kinesio taping on hold due to reaction to tape             SPRINT placed on 9/5, removed on 11/6             Tramadol per PCP (educated on signs/symptoms serotonin syndrome)             Ortho consult ordered by Neurology NP - recommended therapies, which she completed  Prescribed Zynex heating/cooling blanket    6. Bony prominence right wrist             Xray with bony prominence             Voltaren gel in effective  Good benefits with injection on 9/26, patient elected to indefinitely postpone procedure due to lack of pain today   7. Right hand numbness             Managed at present             Positional             Improved  8. Urinary urgency  Unlikely stroke related  Improving  9. Pseudodementia   See #4  Encouraged brain simulating activities  10. Dysesthesias in tongue  Did not respond to  Gabapentin  Refilled Lyrica 50 BID with good benefit  Discussed interactions with other medications.   11. Former smoker: -commended on stopping smoking! Discussed that she has continued to stop smoking.   12. HTN: -BP is 137/71 -continue amlodipine 2.5mg  daily and HCTZ -Advised checking BP daily at home and logging results to bring into follow-up appointment with PCP and myself. -Reviewed BP meds today.  -Advised regarding healthy foods that can help lower blood pressure and provided with a list: 1) citrus foods- high in vitamins and  minerals 2) salmon and other fatty fish - reduces inflammation and oxylipins 3) swiss chard (leafy green)- high level of nitrates 4) pumpkin seeds- one of the best natural sources of magnesium 5) Beans and lentils- high in fiber, magnesium, and potassium 6) Berries- high in flavonoids 7) Amaranth (whole grain, can be cooked similarly to rice and oats)- high in magnesium and fiber 8) Pistachios- even more effective at reducing BP than other nuts 9) Carrots- high in phenolic compounds that relax blood vessels and reduce inflammation 10) Celery- contain phthalides that relax tissues of arterial walls 11) Tomatoes- can also improve cholesterol and reduce risk of heart disease 12) Broccoli- good source of magnesium, calcium, and potassium 13) Greek yogurt: high in potassium and calcium 14) Herbs and spices: Celery seed, cilantro, saffron, lemongrass, black cumin, ginseng, cinnamon, cardamom, sweet basil, and ginger 15) Chia and flax seeds- also help to lower cholesterol and blood sugar 16) Beets- high levels of nitrates that relax blood vessels  17) spinach and bananas- high in potassium  -Provided lise of supplements that can help with hypertension:  1) magnesium: one high quality brand is Bioptemizers since it contains all 7 types of magnesium, otherwise over the counter magnesium gluconate 400mg  is a good option 2) B vitamins 3) vitamin D 4) potassium 5) CoQ10 6) L-arginine 7) Vitamin C 8) Beetroot -Educated that goal BP is 120/80. -Made goal to incorporate some of the above foods into diet.    13) Constipation:  -continue magnesium, probiotic, colace, prune juice -made goal to eat 6 prunes per day -Provided list of following foods that help with constipation and highlighted a few: 1) prunes- contain high amounts of fiber.  2) apples- has a form of dietary fiber called pectin that accelerates stool movement and increases beneficial gut bacteria 3) pears- in addition to fiber, also  high in fructose and sorbitol which have laxative effect 4) figs- contain an enzyme ficin which helps to speed colonic transit 5) kiwis- contain an enzyme actinidin that improves gut motility and reduces constipation 6) oranges- rich in pectin (like apples) 7) grapefruits- contain a flavanol naringenin which has a laxative effect 8) vegetables- rich in fiber and also great sources of folate, vitamin C, and K 9) artichoke- high in inulin, prebiotic great for the microbiome 10) chicory- increases stool frequency and softness (can be added to coffee) 11) rhubarb- laxative effect 12) sweet potato- high fiber 13) beans, peas, and lentils- contain both soluble and insoluble fiber 14) chia seeds- improves intestinal health and gut flora 15) flaxseeds- laxative effect 16) whole grain rye bread- high in fiber 17) oat bran- high in soluble and insoluble fiber 18) kefir- softens stools -recommended to try at least one of these foods every day.  -drink 6-8 glasses of water per day -walk regularly, especially after meals.        14. Provided with list of supplements that can help with dyslipidemia: 1) Vitamin B3 500-4,000mg  in divided doses daily (  would recommend starting low as can cause uncomfortable facial flushing if started at too high a dose) 2) Phytosterols 2.15 grams daily 3) Fermented soy 30-50 grams daily 4) EGCG (found in green tea): 500-1000mg  daily 5) Omega-3 fatty acids 3000-5,000mg  daily 6) Flax seed 40 grams daily 7) Monounsaturated fats 20-40 grams daily (olives, olive oil, nuts), also reduces cardiovascular disease 8) Sesame: 40 grams daily 9) Gamma/delta tocotrienols- a family of unsaturated forms of Vitamin E- 200mg  with dinner 10) Pantethine 900mg  daily in divided doses 11) Resveratrol 250mg  daily 12) N Acetyl Cysteine 2000mg  daily in divided doses 13) Curcumin 2000-5000mg  in divided doses daily 14) Pomegranate juice: 8 ounces daily, also helps to lower blood  pressure 15) Pomegranate seeds one cup daily, also helps to lower blood pressure 16) Citrus Bergamot 1000mg  daily, also helps with glucose control and weight loss 17) Vitamin C 500mg  daily 18) Quercetin 500-1000mg  daily 19) Glutathione 20) Probiotics 60-100 billion organisms per day 21) Fiber 22) Oats 23) Aged garlic (can eat as food or supplement of 600-900mg  per day) 24) Chia seeds 25 grams per day 25) Lycopene- carotenoid found in high concentrations in tomatoes. 26) Alpha linolenic acid 27) Flavonoids and anthocyanins 28) Wogonin- flavanoid that enhances reverse cholesterol transport 29) Coenzyme Q10 30) Pantethine- derivative of Vitamin B5: 300mg  three times per day or 450mg  twice per day with or without food 31) Barley and other whole grains 32) Orange juice 33) L- carnitine 34) L- Lysine 35) L- Arginine 36) Almonds 37) Morin 38) Rutin 39) Carnosine 40) Histidine  41) Kaempferol  42) Organosulfur compounds 43) Vitamin E 44) Oleic acid 45) RBO (ferulic acid gammaoryzanol) 46) grape seed extract 47) Red wine 48) Berberine HCL 500mg  daily or twice per day- more effective and with fewer adverse effects that ezetimibe monotherapy 49) red yeast rice 2400- 4800 mg/day 50) chlorella 51) Licorice   15. Cognitive deficits, mild/brain fog: -discussed mechanism of action of low dose naltrexone as an opioid receptor antagonist which stimulates your body's production of its own natural endogenous opioids, helping to decrease pain. Discussed that it can also decrease T cell response and thus be helpful in decreasing inflammation, and symptoms of brain fog, fatigue, anxiety, depression, and allergies. Discussed that this medication needs to be compounded at a compounding pharmacy and can more expensive. Discussed that I usually start at 1mg  and if this is not providing enough relief then I titrate upward on a monthly basis.   -will call in LDN to [facility], increase to 2mg ,  discussed that this has been helping -discussed her positive performance on MOCA exam -discussed modafinil -discussed that she continues to have short attention span -discussed that lyrica and baclofen can contribute -discussed impaired memory -discussed vascular dementia -encouraged eating fruits and vegetables  16. Dental decay -discussed that she had to have teeth pulled  18. Insomnia: -discussed the importance of getting sunlight exposure during the day. -has failed melatonin supplement  -continue trazodone   19. Constipation:  -Provided list of following foods that help with constipation and highlighted a few: 1) prunes- contain high amounts of fiber.  2) apples- has a form of dietary fiber called pectin that accelerates stool movement and increases beneficial gut bacteria 3) pears- in addition to fiber, also high in fructose and sorbitol which have laxative effect 4) figs- contain an enzyme ficin which helps to speed colonic transit 5) kiwis- contain an enzyme actinidin that improves gut motility and reduces constipation 6) oranges- rich in pectin (like apples)  7) grapefruits- contain a flavanol naringenin which has a laxative effect 8) vegetables- rich in fiber and also great sources of folate, vitamin C, and K 9) artichoke- high in inulin, prebiotic great for the microbiome 10) chicory- increases stool frequency and softness (can be added to coffee) 11) rhubarb- laxative effect 12) sweet potato- high fiber 13) beans, peas, and lentils- contain both soluble and insoluble fiber 14) chia seeds- improves intestinal health and gut flora 15) flaxseeds- laxative effect 16) whole grain rye bread- high in fiber 17) oat bran- high in soluble and insoluble fiber 18) kefir- softens stools -recommended to try at least one of these foods every day.  -drink 6-8 glasses of water per day -walk regularly, especially after meals.

## 2023-11-18 NOTE — Patient Instructions (Addendum)
 Avoid processed foods Avoid fruits, vegetables, wild blueberries, cilantro, nuts Celery juice  Constipation:  -Provided list of following foods that help with constipation and highlighted a few: 1) prunes- contain high amounts of fiber.  2) apples- has a form of dietary fiber called pectin that accelerates stool movement and increases beneficial gut bacteria 3) pears- in addition to fiber, also high in fructose and sorbitol which have laxative effect 4) figs- contain an enzyme ficin which helps to speed colonic transit 5) kiwis- contain an enzyme actinidin that improves gut motility and reduces constipation 6) oranges- rich in pectin (like apples) 7) grapefruits- contain a flavanol naringenin which has a laxative effect 8) vegetables- rich in fiber and also great sources of folate, vitamin C, and K 9) artichoke- high in inulin, prebiotic great for the microbiome 10) chicory- increases stool frequency and softness (can be added to coffee) 11) rhubarb- laxative effect 12) sweet potato- high fiber 13) beans, peas, and lentils- contain both soluble and insoluble fiber 14) chia seeds- improves intestinal health and gut flora 15) flaxseeds- laxative effect 16) whole grain rye bread- high in fiber 17) oat bran- high in soluble and insoluble fiber 18) kefir- softens stools -recommended to try at least one of these foods every day.  -drink 6-8 glasses of water per day -walk regularly, especially after meals.  -celery juice

## 2023-11-19 DIAGNOSIS — E663 Overweight: Secondary | ICD-10-CM | POA: Diagnosis not present

## 2023-11-19 DIAGNOSIS — Z87891 Personal history of nicotine dependence: Secondary | ICD-10-CM | POA: Diagnosis not present

## 2023-11-19 DIAGNOSIS — F4321 Adjustment disorder with depressed mood: Secondary | ICD-10-CM | POA: Diagnosis not present

## 2023-11-19 DIAGNOSIS — R3915 Urgency of urination: Secondary | ICD-10-CM | POA: Diagnosis not present

## 2023-11-19 DIAGNOSIS — K5900 Constipation, unspecified: Secondary | ICD-10-CM | POA: Diagnosis not present

## 2023-11-19 DIAGNOSIS — I10 Essential (primary) hypertension: Secondary | ICD-10-CM | POA: Diagnosis not present

## 2023-11-19 DIAGNOSIS — Z7983 Long term (current) use of bisphosphonates: Secondary | ICD-10-CM | POA: Diagnosis not present

## 2023-11-19 DIAGNOSIS — M792 Neuralgia and neuritis, unspecified: Secondary | ICD-10-CM | POA: Diagnosis not present

## 2023-11-19 DIAGNOSIS — I69398 Other sequelae of cerebral infarction: Secondary | ICD-10-CM | POA: Diagnosis not present

## 2023-11-19 DIAGNOSIS — S43005D Unspecified dislocation of left shoulder joint, subsequent encounter: Secondary | ICD-10-CM | POA: Diagnosis not present

## 2023-11-19 DIAGNOSIS — Z7982 Long term (current) use of aspirin: Secondary | ICD-10-CM | POA: Diagnosis not present

## 2023-11-19 DIAGNOSIS — I69354 Hemiplegia and hemiparesis following cerebral infarction affecting left non-dominant side: Secondary | ICD-10-CM | POA: Diagnosis not present

## 2023-11-19 DIAGNOSIS — G4700 Insomnia, unspecified: Secondary | ICD-10-CM | POA: Diagnosis not present

## 2023-11-19 DIAGNOSIS — Z6826 Body mass index (BMI) 26.0-26.9, adult: Secondary | ICD-10-CM | POA: Diagnosis not present

## 2023-11-19 DIAGNOSIS — F482 Pseudobulbar affect: Secondary | ICD-10-CM | POA: Diagnosis not present

## 2023-11-24 DIAGNOSIS — I69398 Other sequelae of cerebral infarction: Secondary | ICD-10-CM | POA: Diagnosis not present

## 2023-11-24 DIAGNOSIS — R110 Nausea: Secondary | ICD-10-CM | POA: Diagnosis not present

## 2023-11-24 DIAGNOSIS — F482 Pseudobulbar affect: Secondary | ICD-10-CM | POA: Diagnosis not present

## 2023-11-24 DIAGNOSIS — R399 Unspecified symptoms and signs involving the genitourinary system: Secondary | ICD-10-CM | POA: Diagnosis not present

## 2023-11-24 DIAGNOSIS — I10 Essential (primary) hypertension: Secondary | ICD-10-CM | POA: Diagnosis not present

## 2023-11-24 DIAGNOSIS — M792 Neuralgia and neuritis, unspecified: Secondary | ICD-10-CM | POA: Diagnosis not present

## 2023-11-24 DIAGNOSIS — F4321 Adjustment disorder with depressed mood: Secondary | ICD-10-CM | POA: Diagnosis not present

## 2023-11-24 DIAGNOSIS — I69354 Hemiplegia and hemiparesis following cerebral infarction affecting left non-dominant side: Secondary | ICD-10-CM | POA: Diagnosis not present

## 2023-11-27 DIAGNOSIS — I69354 Hemiplegia and hemiparesis following cerebral infarction affecting left non-dominant side: Secondary | ICD-10-CM | POA: Diagnosis not present

## 2023-11-27 DIAGNOSIS — M792 Neuralgia and neuritis, unspecified: Secondary | ICD-10-CM | POA: Diagnosis not present

## 2023-11-27 DIAGNOSIS — I69398 Other sequelae of cerebral infarction: Secondary | ICD-10-CM | POA: Diagnosis not present

## 2023-11-27 DIAGNOSIS — I10 Essential (primary) hypertension: Secondary | ICD-10-CM | POA: Diagnosis not present

## 2023-11-27 DIAGNOSIS — F4321 Adjustment disorder with depressed mood: Secondary | ICD-10-CM | POA: Diagnosis not present

## 2023-11-27 DIAGNOSIS — F482 Pseudobulbar affect: Secondary | ICD-10-CM | POA: Diagnosis not present

## 2023-12-01 ENCOUNTER — Telehealth: Payer: Self-pay | Admitting: Physical Medicine and Rehabilitation

## 2023-12-01 DIAGNOSIS — F482 Pseudobulbar affect: Secondary | ICD-10-CM | POA: Diagnosis not present

## 2023-12-01 DIAGNOSIS — M792 Neuralgia and neuritis, unspecified: Secondary | ICD-10-CM | POA: Diagnosis not present

## 2023-12-01 DIAGNOSIS — I69354 Hemiplegia and hemiparesis following cerebral infarction affecting left non-dominant side: Secondary | ICD-10-CM | POA: Diagnosis not present

## 2023-12-01 DIAGNOSIS — I10 Essential (primary) hypertension: Secondary | ICD-10-CM | POA: Diagnosis not present

## 2023-12-01 DIAGNOSIS — I69398 Other sequelae of cerebral infarction: Secondary | ICD-10-CM | POA: Diagnosis not present

## 2023-12-01 DIAGNOSIS — F4321 Adjustment disorder with depressed mood: Secondary | ICD-10-CM | POA: Diagnosis not present

## 2023-12-01 NOTE — Telephone Encounter (Signed)
 Blaise Bumps from [facility] health called requesting to continue PT and OT for patient , 1 x a week for 9 weeks  Please contact [phone]

## 2023-12-03 DIAGNOSIS — F4321 Adjustment disorder with depressed mood: Secondary | ICD-10-CM | POA: Diagnosis not present

## 2023-12-03 DIAGNOSIS — F3341 Major depressive disorder, recurrent, in partial remission: Secondary | ICD-10-CM | POA: Diagnosis not present

## 2023-12-03 DIAGNOSIS — I69354 Hemiplegia and hemiparesis following cerebral infarction affecting left non-dominant side: Secondary | ICD-10-CM | POA: Diagnosis not present

## 2023-12-03 DIAGNOSIS — E785 Hyperlipidemia, unspecified: Secondary | ICD-10-CM | POA: Diagnosis not present

## 2023-12-03 DIAGNOSIS — I69398 Other sequelae of cerebral infarction: Secondary | ICD-10-CM | POA: Diagnosis not present

## 2023-12-03 DIAGNOSIS — I10 Essential (primary) hypertension: Secondary | ICD-10-CM | POA: Diagnosis not present

## 2023-12-03 DIAGNOSIS — F482 Pseudobulbar affect: Secondary | ICD-10-CM | POA: Diagnosis not present

## 2023-12-03 DIAGNOSIS — M792 Neuralgia and neuritis, unspecified: Secondary | ICD-10-CM | POA: Diagnosis not present

## 2023-12-08 DIAGNOSIS — I69398 Other sequelae of cerebral infarction: Secondary | ICD-10-CM | POA: Diagnosis not present

## 2023-12-08 DIAGNOSIS — F4321 Adjustment disorder with depressed mood: Secondary | ICD-10-CM | POA: Diagnosis not present

## 2023-12-08 DIAGNOSIS — M792 Neuralgia and neuritis, unspecified: Secondary | ICD-10-CM | POA: Diagnosis not present

## 2023-12-08 DIAGNOSIS — I69354 Hemiplegia and hemiparesis following cerebral infarction affecting left non-dominant side: Secondary | ICD-10-CM | POA: Diagnosis not present

## 2023-12-08 DIAGNOSIS — I10 Essential (primary) hypertension: Secondary | ICD-10-CM | POA: Diagnosis not present

## 2023-12-08 DIAGNOSIS — F482 Pseudobulbar affect: Secondary | ICD-10-CM | POA: Diagnosis not present

## 2023-12-09 DIAGNOSIS — R2689 Other abnormalities of gait and mobility: Secondary | ICD-10-CM | POA: Diagnosis not present

## 2023-12-09 DIAGNOSIS — M24572 Contracture, left ankle: Secondary | ICD-10-CM | POA: Diagnosis not present

## 2023-12-09 DIAGNOSIS — G608 Other hereditary and idiopathic neuropathies: Secondary | ICD-10-CM | POA: Diagnosis not present

## 2023-12-09 DIAGNOSIS — D2372 Other benign neoplasm of skin of left lower limb, including hip: Secondary | ICD-10-CM | POA: Diagnosis not present

## 2023-12-09 DIAGNOSIS — M79672 Pain in left foot: Secondary | ICD-10-CM | POA: Diagnosis not present

## 2023-12-09 DIAGNOSIS — B351 Tinea unguium: Secondary | ICD-10-CM | POA: Diagnosis not present

## 2023-12-09 DIAGNOSIS — M21372 Foot drop, left foot: Secondary | ICD-10-CM | POA: Diagnosis not present

## 2023-12-09 NOTE — Telephone Encounter (Signed)
 done

## 2023-12-11 DIAGNOSIS — I10 Essential (primary) hypertension: Secondary | ICD-10-CM | POA: Diagnosis not present

## 2023-12-11 DIAGNOSIS — F4321 Adjustment disorder with depressed mood: Secondary | ICD-10-CM | POA: Diagnosis not present

## 2023-12-11 DIAGNOSIS — F482 Pseudobulbar affect: Secondary | ICD-10-CM | POA: Diagnosis not present

## 2023-12-11 DIAGNOSIS — M792 Neuralgia and neuritis, unspecified: Secondary | ICD-10-CM | POA: Diagnosis not present

## 2023-12-11 DIAGNOSIS — I69354 Hemiplegia and hemiparesis following cerebral infarction affecting left non-dominant side: Secondary | ICD-10-CM | POA: Diagnosis not present

## 2023-12-11 DIAGNOSIS — I69398 Other sequelae of cerebral infarction: Secondary | ICD-10-CM | POA: Diagnosis not present

## 2023-12-15 DIAGNOSIS — M792 Neuralgia and neuritis, unspecified: Secondary | ICD-10-CM | POA: Diagnosis not present

## 2023-12-15 DIAGNOSIS — F4321 Adjustment disorder with depressed mood: Secondary | ICD-10-CM | POA: Diagnosis not present

## 2023-12-15 DIAGNOSIS — I69398 Other sequelae of cerebral infarction: Secondary | ICD-10-CM | POA: Diagnosis not present

## 2023-12-15 DIAGNOSIS — I69354 Hemiplegia and hemiparesis following cerebral infarction affecting left non-dominant side: Secondary | ICD-10-CM | POA: Diagnosis not present

## 2023-12-15 DIAGNOSIS — I10 Essential (primary) hypertension: Secondary | ICD-10-CM | POA: Diagnosis not present

## 2023-12-15 DIAGNOSIS — F482 Pseudobulbar affect: Secondary | ICD-10-CM | POA: Diagnosis not present

## 2023-12-16 DIAGNOSIS — I69398 Other sequelae of cerebral infarction: Secondary | ICD-10-CM | POA: Diagnosis not present

## 2023-12-16 DIAGNOSIS — M792 Neuralgia and neuritis, unspecified: Secondary | ICD-10-CM | POA: Diagnosis not present

## 2023-12-16 DIAGNOSIS — I69354 Hemiplegia and hemiparesis following cerebral infarction affecting left non-dominant side: Secondary | ICD-10-CM | POA: Diagnosis not present

## 2023-12-16 DIAGNOSIS — F482 Pseudobulbar affect: Secondary | ICD-10-CM | POA: Diagnosis not present

## 2023-12-16 DIAGNOSIS — I10 Essential (primary) hypertension: Secondary | ICD-10-CM | POA: Diagnosis not present

## 2023-12-16 DIAGNOSIS — F4321 Adjustment disorder with depressed mood: Secondary | ICD-10-CM | POA: Diagnosis not present

## 2023-12-19 DIAGNOSIS — K5900 Constipation, unspecified: Secondary | ICD-10-CM | POA: Diagnosis not present

## 2023-12-19 DIAGNOSIS — F4321 Adjustment disorder with depressed mood: Secondary | ICD-10-CM | POA: Diagnosis not present

## 2023-12-19 DIAGNOSIS — F482 Pseudobulbar affect: Secondary | ICD-10-CM | POA: Diagnosis not present

## 2023-12-19 DIAGNOSIS — I69398 Other sequelae of cerebral infarction: Secondary | ICD-10-CM | POA: Diagnosis not present

## 2023-12-19 DIAGNOSIS — E663 Overweight: Secondary | ICD-10-CM | POA: Diagnosis not present

## 2023-12-19 DIAGNOSIS — Z7983 Long term (current) use of bisphosphonates: Secondary | ICD-10-CM | POA: Diagnosis not present

## 2023-12-19 DIAGNOSIS — I10 Essential (primary) hypertension: Secondary | ICD-10-CM | POA: Diagnosis not present

## 2023-12-19 DIAGNOSIS — G4700 Insomnia, unspecified: Secondary | ICD-10-CM | POA: Diagnosis not present

## 2023-12-19 DIAGNOSIS — M792 Neuralgia and neuritis, unspecified: Secondary | ICD-10-CM | POA: Diagnosis not present

## 2023-12-19 DIAGNOSIS — S43005D Unspecified dislocation of left shoulder joint, subsequent encounter: Secondary | ICD-10-CM | POA: Diagnosis not present

## 2023-12-19 DIAGNOSIS — Z7982 Long term (current) use of aspirin: Secondary | ICD-10-CM | POA: Diagnosis not present

## 2023-12-19 DIAGNOSIS — Z6826 Body mass index (BMI) 26.0-26.9, adult: Secondary | ICD-10-CM | POA: Diagnosis not present

## 2023-12-19 DIAGNOSIS — I69354 Hemiplegia and hemiparesis following cerebral infarction affecting left non-dominant side: Secondary | ICD-10-CM | POA: Diagnosis not present

## 2023-12-19 DIAGNOSIS — R3915 Urgency of urination: Secondary | ICD-10-CM | POA: Diagnosis not present

## 2023-12-19 DIAGNOSIS — Z87891 Personal history of nicotine dependence: Secondary | ICD-10-CM | POA: Diagnosis not present

## 2023-12-21 ENCOUNTER — Other Ambulatory Visit: Payer: Self-pay | Admitting: Physical Medicine and Rehabilitation

## 2023-12-22 DIAGNOSIS — M792 Neuralgia and neuritis, unspecified: Secondary | ICD-10-CM | POA: Diagnosis not present

## 2023-12-22 DIAGNOSIS — I69354 Hemiplegia and hemiparesis following cerebral infarction affecting left non-dominant side: Secondary | ICD-10-CM | POA: Diagnosis not present

## 2023-12-22 DIAGNOSIS — I10 Essential (primary) hypertension: Secondary | ICD-10-CM | POA: Diagnosis not present

## 2023-12-22 DIAGNOSIS — I69398 Other sequelae of cerebral infarction: Secondary | ICD-10-CM | POA: Diagnosis not present

## 2023-12-22 DIAGNOSIS — F482 Pseudobulbar affect: Secondary | ICD-10-CM | POA: Diagnosis not present

## 2023-12-22 DIAGNOSIS — F4321 Adjustment disorder with depressed mood: Secondary | ICD-10-CM | POA: Diagnosis not present

## 2023-12-23 DIAGNOSIS — I69398 Other sequelae of cerebral infarction: Secondary | ICD-10-CM | POA: Diagnosis not present

## 2023-12-23 DIAGNOSIS — I69354 Hemiplegia and hemiparesis following cerebral infarction affecting left non-dominant side: Secondary | ICD-10-CM | POA: Diagnosis not present

## 2023-12-23 DIAGNOSIS — F4321 Adjustment disorder with depressed mood: Secondary | ICD-10-CM | POA: Diagnosis not present

## 2023-12-23 DIAGNOSIS — F482 Pseudobulbar affect: Secondary | ICD-10-CM | POA: Diagnosis not present

## 2023-12-23 DIAGNOSIS — I10 Essential (primary) hypertension: Secondary | ICD-10-CM | POA: Diagnosis not present

## 2023-12-23 DIAGNOSIS — M792 Neuralgia and neuritis, unspecified: Secondary | ICD-10-CM | POA: Diagnosis not present

## 2023-12-29 DIAGNOSIS — M792 Neuralgia and neuritis, unspecified: Secondary | ICD-10-CM | POA: Diagnosis not present

## 2023-12-29 DIAGNOSIS — I10 Essential (primary) hypertension: Secondary | ICD-10-CM | POA: Diagnosis not present

## 2023-12-29 DIAGNOSIS — I69354 Hemiplegia and hemiparesis following cerebral infarction affecting left non-dominant side: Secondary | ICD-10-CM | POA: Diagnosis not present

## 2023-12-29 DIAGNOSIS — F482 Pseudobulbar affect: Secondary | ICD-10-CM | POA: Diagnosis not present

## 2023-12-29 DIAGNOSIS — F4321 Adjustment disorder with depressed mood: Secondary | ICD-10-CM | POA: Diagnosis not present

## 2023-12-29 DIAGNOSIS — I69398 Other sequelae of cerebral infarction: Secondary | ICD-10-CM | POA: Diagnosis not present

## 2023-12-30 DIAGNOSIS — M792 Neuralgia and neuritis, unspecified: Secondary | ICD-10-CM | POA: Diagnosis not present

## 2023-12-30 DIAGNOSIS — I10 Essential (primary) hypertension: Secondary | ICD-10-CM | POA: Diagnosis not present

## 2023-12-30 DIAGNOSIS — F4321 Adjustment disorder with depressed mood: Secondary | ICD-10-CM | POA: Diagnosis not present

## 2023-12-30 DIAGNOSIS — F482 Pseudobulbar affect: Secondary | ICD-10-CM | POA: Diagnosis not present

## 2023-12-30 DIAGNOSIS — I69354 Hemiplegia and hemiparesis following cerebral infarction affecting left non-dominant side: Secondary | ICD-10-CM | POA: Diagnosis not present

## 2023-12-30 DIAGNOSIS — I69398 Other sequelae of cerebral infarction: Secondary | ICD-10-CM | POA: Diagnosis not present

## 2024-01-06 DIAGNOSIS — F482 Pseudobulbar affect: Secondary | ICD-10-CM | POA: Diagnosis not present

## 2024-01-06 DIAGNOSIS — F4321 Adjustment disorder with depressed mood: Secondary | ICD-10-CM | POA: Diagnosis not present

## 2024-01-06 DIAGNOSIS — M792 Neuralgia and neuritis, unspecified: Secondary | ICD-10-CM | POA: Diagnosis not present

## 2024-01-06 DIAGNOSIS — I69354 Hemiplegia and hemiparesis following cerebral infarction affecting left non-dominant side: Secondary | ICD-10-CM | POA: Diagnosis not present

## 2024-01-06 DIAGNOSIS — I10 Essential (primary) hypertension: Secondary | ICD-10-CM | POA: Diagnosis not present

## 2024-01-06 DIAGNOSIS — I69398 Other sequelae of cerebral infarction: Secondary | ICD-10-CM | POA: Diagnosis not present

## 2024-01-13 DIAGNOSIS — I10 Essential (primary) hypertension: Secondary | ICD-10-CM | POA: Diagnosis not present

## 2024-01-13 DIAGNOSIS — F482 Pseudobulbar affect: Secondary | ICD-10-CM | POA: Diagnosis not present

## 2024-01-13 DIAGNOSIS — F4321 Adjustment disorder with depressed mood: Secondary | ICD-10-CM | POA: Diagnosis not present

## 2024-01-13 DIAGNOSIS — I69354 Hemiplegia and hemiparesis following cerebral infarction affecting left non-dominant side: Secondary | ICD-10-CM | POA: Diagnosis not present

## 2024-01-13 DIAGNOSIS — M792 Neuralgia and neuritis, unspecified: Secondary | ICD-10-CM | POA: Diagnosis not present

## 2024-01-13 DIAGNOSIS — I69398 Other sequelae of cerebral infarction: Secondary | ICD-10-CM | POA: Diagnosis not present

## 2024-01-15 DIAGNOSIS — F4321 Adjustment disorder with depressed mood: Secondary | ICD-10-CM | POA: Diagnosis not present

## 2024-01-15 DIAGNOSIS — M792 Neuralgia and neuritis, unspecified: Secondary | ICD-10-CM | POA: Diagnosis not present

## 2024-01-15 DIAGNOSIS — I69354 Hemiplegia and hemiparesis following cerebral infarction affecting left non-dominant side: Secondary | ICD-10-CM | POA: Diagnosis not present

## 2024-01-15 DIAGNOSIS — F482 Pseudobulbar affect: Secondary | ICD-10-CM | POA: Diagnosis not present

## 2024-01-15 DIAGNOSIS — I69398 Other sequelae of cerebral infarction: Secondary | ICD-10-CM | POA: Diagnosis not present

## 2024-01-15 DIAGNOSIS — I10 Essential (primary) hypertension: Secondary | ICD-10-CM | POA: Diagnosis not present

## 2024-01-18 DIAGNOSIS — E663 Overweight: Secondary | ICD-10-CM | POA: Diagnosis not present

## 2024-01-18 DIAGNOSIS — I10 Essential (primary) hypertension: Secondary | ICD-10-CM | POA: Diagnosis not present

## 2024-01-18 DIAGNOSIS — Z7983 Long term (current) use of bisphosphonates: Secondary | ICD-10-CM | POA: Diagnosis not present

## 2024-01-18 DIAGNOSIS — F482 Pseudobulbar affect: Secondary | ICD-10-CM | POA: Diagnosis not present

## 2024-01-18 DIAGNOSIS — G4700 Insomnia, unspecified: Secondary | ICD-10-CM | POA: Diagnosis not present

## 2024-01-18 DIAGNOSIS — F4321 Adjustment disorder with depressed mood: Secondary | ICD-10-CM | POA: Diagnosis not present

## 2024-01-18 DIAGNOSIS — I69398 Other sequelae of cerebral infarction: Secondary | ICD-10-CM | POA: Diagnosis not present

## 2024-01-18 DIAGNOSIS — I69354 Hemiplegia and hemiparesis following cerebral infarction affecting left non-dominant side: Secondary | ICD-10-CM | POA: Diagnosis not present

## 2024-01-18 DIAGNOSIS — Z6826 Body mass index (BMI) 26.0-26.9, adult: Secondary | ICD-10-CM | POA: Diagnosis not present

## 2024-01-18 DIAGNOSIS — K5900 Constipation, unspecified: Secondary | ICD-10-CM | POA: Diagnosis not present

## 2024-01-18 DIAGNOSIS — Z7982 Long term (current) use of aspirin: Secondary | ICD-10-CM | POA: Diagnosis not present

## 2024-01-18 DIAGNOSIS — F332 Major depressive disorder, recurrent severe without psychotic features: Secondary | ICD-10-CM | POA: Diagnosis not present

## 2024-01-18 DIAGNOSIS — M792 Neuralgia and neuritis, unspecified: Secondary | ICD-10-CM | POA: Diagnosis not present

## 2024-01-18 DIAGNOSIS — Z87891 Personal history of nicotine dependence: Secondary | ICD-10-CM | POA: Diagnosis not present

## 2024-01-19 DIAGNOSIS — F332 Major depressive disorder, recurrent severe without psychotic features: Secondary | ICD-10-CM | POA: Diagnosis not present

## 2024-01-19 DIAGNOSIS — I69398 Other sequelae of cerebral infarction: Secondary | ICD-10-CM | POA: Diagnosis not present

## 2024-01-19 DIAGNOSIS — I69354 Hemiplegia and hemiparesis following cerebral infarction affecting left non-dominant side: Secondary | ICD-10-CM | POA: Diagnosis not present

## 2024-01-19 DIAGNOSIS — F482 Pseudobulbar affect: Secondary | ICD-10-CM | POA: Diagnosis not present

## 2024-01-19 DIAGNOSIS — I10 Essential (primary) hypertension: Secondary | ICD-10-CM | POA: Diagnosis not present

## 2024-01-19 DIAGNOSIS — M792 Neuralgia and neuritis, unspecified: Secondary | ICD-10-CM | POA: Diagnosis not present

## 2024-01-21 DIAGNOSIS — I69398 Other sequelae of cerebral infarction: Secondary | ICD-10-CM | POA: Diagnosis not present

## 2024-01-21 DIAGNOSIS — I10 Essential (primary) hypertension: Secondary | ICD-10-CM | POA: Diagnosis not present

## 2024-01-21 DIAGNOSIS — I69354 Hemiplegia and hemiparesis following cerebral infarction affecting left non-dominant side: Secondary | ICD-10-CM | POA: Diagnosis not present

## 2024-01-21 DIAGNOSIS — F482 Pseudobulbar affect: Secondary | ICD-10-CM | POA: Diagnosis not present

## 2024-01-21 DIAGNOSIS — M792 Neuralgia and neuritis, unspecified: Secondary | ICD-10-CM | POA: Diagnosis not present

## 2024-01-21 DIAGNOSIS — F332 Major depressive disorder, recurrent severe without psychotic features: Secondary | ICD-10-CM | POA: Diagnosis not present

## 2024-01-26 DIAGNOSIS — I69354 Hemiplegia and hemiparesis following cerebral infarction affecting left non-dominant side: Secondary | ICD-10-CM | POA: Diagnosis not present

## 2024-01-26 DIAGNOSIS — F482 Pseudobulbar affect: Secondary | ICD-10-CM | POA: Diagnosis not present

## 2024-01-26 DIAGNOSIS — M792 Neuralgia and neuritis, unspecified: Secondary | ICD-10-CM | POA: Diagnosis not present

## 2024-01-26 DIAGNOSIS — I69398 Other sequelae of cerebral infarction: Secondary | ICD-10-CM | POA: Diagnosis not present

## 2024-01-26 DIAGNOSIS — I10 Essential (primary) hypertension: Secondary | ICD-10-CM | POA: Diagnosis not present

## 2024-01-26 DIAGNOSIS — F332 Major depressive disorder, recurrent severe without psychotic features: Secondary | ICD-10-CM | POA: Diagnosis not present

## 2024-01-29 DIAGNOSIS — I69398 Other sequelae of cerebral infarction: Secondary | ICD-10-CM | POA: Diagnosis not present

## 2024-01-29 DIAGNOSIS — I10 Essential (primary) hypertension: Secondary | ICD-10-CM | POA: Diagnosis not present

## 2024-01-29 DIAGNOSIS — F332 Major depressive disorder, recurrent severe without psychotic features: Secondary | ICD-10-CM | POA: Diagnosis not present

## 2024-01-29 DIAGNOSIS — M792 Neuralgia and neuritis, unspecified: Secondary | ICD-10-CM | POA: Diagnosis not present

## 2024-01-29 DIAGNOSIS — F482 Pseudobulbar affect: Secondary | ICD-10-CM | POA: Diagnosis not present

## 2024-01-29 DIAGNOSIS — I69354 Hemiplegia and hemiparesis following cerebral infarction affecting left non-dominant side: Secondary | ICD-10-CM | POA: Diagnosis not present

## 2024-02-02 DIAGNOSIS — I69398 Other sequelae of cerebral infarction: Secondary | ICD-10-CM | POA: Diagnosis not present

## 2024-02-02 DIAGNOSIS — F482 Pseudobulbar affect: Secondary | ICD-10-CM | POA: Diagnosis not present

## 2024-02-02 DIAGNOSIS — I69354 Hemiplegia and hemiparesis following cerebral infarction affecting left non-dominant side: Secondary | ICD-10-CM | POA: Diagnosis not present

## 2024-02-02 DIAGNOSIS — F332 Major depressive disorder, recurrent severe without psychotic features: Secondary | ICD-10-CM | POA: Diagnosis not present

## 2024-02-02 DIAGNOSIS — M792 Neuralgia and neuritis, unspecified: Secondary | ICD-10-CM | POA: Diagnosis not present

## 2024-02-02 DIAGNOSIS — I10 Essential (primary) hypertension: Secondary | ICD-10-CM | POA: Diagnosis not present

## 2024-02-03 DIAGNOSIS — I69354 Hemiplegia and hemiparesis following cerebral infarction affecting left non-dominant side: Secondary | ICD-10-CM | POA: Diagnosis not present

## 2024-02-03 DIAGNOSIS — F332 Major depressive disorder, recurrent severe without psychotic features: Secondary | ICD-10-CM | POA: Diagnosis not present

## 2024-02-03 DIAGNOSIS — I69398 Other sequelae of cerebral infarction: Secondary | ICD-10-CM | POA: Diagnosis not present

## 2024-02-03 DIAGNOSIS — I10 Essential (primary) hypertension: Secondary | ICD-10-CM | POA: Diagnosis not present

## 2024-02-03 DIAGNOSIS — M792 Neuralgia and neuritis, unspecified: Secondary | ICD-10-CM | POA: Diagnosis not present

## 2024-02-03 DIAGNOSIS — F482 Pseudobulbar affect: Secondary | ICD-10-CM | POA: Diagnosis not present

## 2024-02-12 DIAGNOSIS — M792 Neuralgia and neuritis, unspecified: Secondary | ICD-10-CM | POA: Diagnosis not present

## 2024-02-12 DIAGNOSIS — I69398 Other sequelae of cerebral infarction: Secondary | ICD-10-CM | POA: Diagnosis not present

## 2024-02-12 DIAGNOSIS — F482 Pseudobulbar affect: Secondary | ICD-10-CM | POA: Diagnosis not present

## 2024-02-12 DIAGNOSIS — F332 Major depressive disorder, recurrent severe without psychotic features: Secondary | ICD-10-CM | POA: Diagnosis not present

## 2024-02-12 DIAGNOSIS — I69354 Hemiplegia and hemiparesis following cerebral infarction affecting left non-dominant side: Secondary | ICD-10-CM | POA: Diagnosis not present

## 2024-02-12 DIAGNOSIS — I10 Essential (primary) hypertension: Secondary | ICD-10-CM | POA: Diagnosis not present

## 2024-02-17 DIAGNOSIS — Z23 Encounter for immunization: Secondary | ICD-10-CM | POA: Diagnosis not present

## 2024-02-17 DIAGNOSIS — Z13820 Encounter for screening for osteoporosis: Secondary | ICD-10-CM | POA: Diagnosis not present

## 2024-02-17 DIAGNOSIS — Z7982 Long term (current) use of aspirin: Secondary | ICD-10-CM | POA: Diagnosis not present

## 2024-02-17 DIAGNOSIS — Z7983 Long term (current) use of bisphosphonates: Secondary | ICD-10-CM | POA: Diagnosis not present

## 2024-02-17 DIAGNOSIS — F332 Major depressive disorder, recurrent severe without psychotic features: Secondary | ICD-10-CM | POA: Diagnosis not present

## 2024-02-17 DIAGNOSIS — I10 Essential (primary) hypertension: Secondary | ICD-10-CM | POA: Diagnosis not present

## 2024-02-17 DIAGNOSIS — M792 Neuralgia and neuritis, unspecified: Secondary | ICD-10-CM | POA: Diagnosis not present

## 2024-02-17 DIAGNOSIS — I69354 Hemiplegia and hemiparesis following cerebral infarction affecting left non-dominant side: Secondary | ICD-10-CM | POA: Diagnosis not present

## 2024-02-17 DIAGNOSIS — Z1331 Encounter for screening for depression: Secondary | ICD-10-CM | POA: Diagnosis not present

## 2024-02-17 DIAGNOSIS — E785 Hyperlipidemia, unspecified: Secondary | ICD-10-CM | POA: Diagnosis not present

## 2024-02-17 DIAGNOSIS — K5900 Constipation, unspecified: Secondary | ICD-10-CM | POA: Diagnosis not present

## 2024-02-17 DIAGNOSIS — F482 Pseudobulbar affect: Secondary | ICD-10-CM | POA: Diagnosis not present

## 2024-02-17 DIAGNOSIS — Z87891 Personal history of nicotine dependence: Secondary | ICD-10-CM | POA: Diagnosis not present

## 2024-02-17 DIAGNOSIS — Z0000 Encounter for general adult medical examination without abnormal findings: Secondary | ICD-10-CM | POA: Diagnosis not present

## 2024-02-17 DIAGNOSIS — F4321 Adjustment disorder with depressed mood: Secondary | ICD-10-CM | POA: Diagnosis not present

## 2024-02-17 DIAGNOSIS — E663 Overweight: Secondary | ICD-10-CM | POA: Diagnosis not present

## 2024-02-17 DIAGNOSIS — I69398 Other sequelae of cerebral infarction: Secondary | ICD-10-CM | POA: Diagnosis not present

## 2024-02-17 DIAGNOSIS — G8194 Hemiplegia, unspecified affecting left nondominant side: Secondary | ICD-10-CM | POA: Diagnosis not present

## 2024-02-17 DIAGNOSIS — Z6826 Body mass index (BMI) 26.0-26.9, adult: Secondary | ICD-10-CM | POA: Diagnosis not present

## 2024-02-17 DIAGNOSIS — G4700 Insomnia, unspecified: Secondary | ICD-10-CM | POA: Diagnosis not present

## 2024-02-17 DIAGNOSIS — I679 Cerebrovascular disease, unspecified: Secondary | ICD-10-CM | POA: Diagnosis not present

## 2024-02-19 DIAGNOSIS — F332 Major depressive disorder, recurrent severe without psychotic features: Secondary | ICD-10-CM | POA: Diagnosis not present

## 2024-02-19 DIAGNOSIS — I10 Essential (primary) hypertension: Secondary | ICD-10-CM | POA: Diagnosis not present

## 2024-02-19 DIAGNOSIS — I69398 Other sequelae of cerebral infarction: Secondary | ICD-10-CM | POA: Diagnosis not present

## 2024-02-19 DIAGNOSIS — F482 Pseudobulbar affect: Secondary | ICD-10-CM | POA: Diagnosis not present

## 2024-02-19 DIAGNOSIS — M792 Neuralgia and neuritis, unspecified: Secondary | ICD-10-CM | POA: Diagnosis not present

## 2024-02-19 DIAGNOSIS — I69354 Hemiplegia and hemiparesis following cerebral infarction affecting left non-dominant side: Secondary | ICD-10-CM | POA: Diagnosis not present

## 2024-02-23 DIAGNOSIS — F482 Pseudobulbar affect: Secondary | ICD-10-CM | POA: Diagnosis not present

## 2024-02-23 DIAGNOSIS — I69354 Hemiplegia and hemiparesis following cerebral infarction affecting left non-dominant side: Secondary | ICD-10-CM | POA: Diagnosis not present

## 2024-02-23 DIAGNOSIS — F332 Major depressive disorder, recurrent severe without psychotic features: Secondary | ICD-10-CM | POA: Diagnosis not present

## 2024-02-23 DIAGNOSIS — M792 Neuralgia and neuritis, unspecified: Secondary | ICD-10-CM | POA: Diagnosis not present

## 2024-02-23 DIAGNOSIS — I69398 Other sequelae of cerebral infarction: Secondary | ICD-10-CM | POA: Diagnosis not present

## 2024-02-23 DIAGNOSIS — I10 Essential (primary) hypertension: Secondary | ICD-10-CM | POA: Diagnosis not present

## 2024-02-25 DIAGNOSIS — F482 Pseudobulbar affect: Secondary | ICD-10-CM | POA: Diagnosis not present

## 2024-02-25 DIAGNOSIS — M792 Neuralgia and neuritis, unspecified: Secondary | ICD-10-CM | POA: Diagnosis not present

## 2024-02-25 DIAGNOSIS — F332 Major depressive disorder, recurrent severe without psychotic features: Secondary | ICD-10-CM | POA: Diagnosis not present

## 2024-02-25 DIAGNOSIS — I69398 Other sequelae of cerebral infarction: Secondary | ICD-10-CM | POA: Diagnosis not present

## 2024-02-25 DIAGNOSIS — I69354 Hemiplegia and hemiparesis following cerebral infarction affecting left non-dominant side: Secondary | ICD-10-CM | POA: Diagnosis not present

## 2024-02-25 DIAGNOSIS — I10 Essential (primary) hypertension: Secondary | ICD-10-CM | POA: Diagnosis not present

## 2024-03-01 DIAGNOSIS — I69398 Other sequelae of cerebral infarction: Secondary | ICD-10-CM | POA: Diagnosis not present

## 2024-03-01 DIAGNOSIS — I69354 Hemiplegia and hemiparesis following cerebral infarction affecting left non-dominant side: Secondary | ICD-10-CM | POA: Diagnosis not present

## 2024-03-01 DIAGNOSIS — I10 Essential (primary) hypertension: Secondary | ICD-10-CM | POA: Diagnosis not present

## 2024-03-01 DIAGNOSIS — M792 Neuralgia and neuritis, unspecified: Secondary | ICD-10-CM | POA: Diagnosis not present

## 2024-03-01 DIAGNOSIS — F482 Pseudobulbar affect: Secondary | ICD-10-CM | POA: Diagnosis not present

## 2024-03-01 DIAGNOSIS — F332 Major depressive disorder, recurrent severe without psychotic features: Secondary | ICD-10-CM | POA: Diagnosis not present

## 2024-03-05 DIAGNOSIS — G608 Other hereditary and idiopathic neuropathies: Secondary | ICD-10-CM | POA: Diagnosis not present

## 2024-03-05 DIAGNOSIS — M21372 Foot drop, left foot: Secondary | ICD-10-CM | POA: Diagnosis not present

## 2024-03-05 DIAGNOSIS — M24572 Contracture, left ankle: Secondary | ICD-10-CM | POA: Diagnosis not present

## 2024-03-05 DIAGNOSIS — D2372 Other benign neoplasm of skin of left lower limb, including hip: Secondary | ICD-10-CM | POA: Diagnosis not present

## 2024-03-05 DIAGNOSIS — R2689 Other abnormalities of gait and mobility: Secondary | ICD-10-CM | POA: Diagnosis not present

## 2024-03-05 DIAGNOSIS — M79672 Pain in left foot: Secondary | ICD-10-CM | POA: Diagnosis not present

## 2024-03-05 DIAGNOSIS — B351 Tinea unguium: Secondary | ICD-10-CM | POA: Diagnosis not present

## 2024-03-08 DIAGNOSIS — M792 Neuralgia and neuritis, unspecified: Secondary | ICD-10-CM | POA: Diagnosis not present

## 2024-03-08 DIAGNOSIS — I69354 Hemiplegia and hemiparesis following cerebral infarction affecting left non-dominant side: Secondary | ICD-10-CM | POA: Diagnosis not present

## 2024-03-08 DIAGNOSIS — I10 Essential (primary) hypertension: Secondary | ICD-10-CM | POA: Diagnosis not present

## 2024-03-08 DIAGNOSIS — F482 Pseudobulbar affect: Secondary | ICD-10-CM | POA: Diagnosis not present

## 2024-03-08 DIAGNOSIS — I69398 Other sequelae of cerebral infarction: Secondary | ICD-10-CM | POA: Diagnosis not present

## 2024-03-08 DIAGNOSIS — F332 Major depressive disorder, recurrent severe without psychotic features: Secondary | ICD-10-CM | POA: Diagnosis not present

## 2024-03-15 DIAGNOSIS — F332 Major depressive disorder, recurrent severe without psychotic features: Secondary | ICD-10-CM | POA: Diagnosis not present

## 2024-03-15 DIAGNOSIS — I69398 Other sequelae of cerebral infarction: Secondary | ICD-10-CM | POA: Diagnosis not present

## 2024-03-15 DIAGNOSIS — F482 Pseudobulbar affect: Secondary | ICD-10-CM | POA: Diagnosis not present

## 2024-03-15 DIAGNOSIS — I10 Essential (primary) hypertension: Secondary | ICD-10-CM | POA: Diagnosis not present

## 2024-03-15 DIAGNOSIS — I69354 Hemiplegia and hemiparesis following cerebral infarction affecting left non-dominant side: Secondary | ICD-10-CM | POA: Diagnosis not present

## 2024-03-15 DIAGNOSIS — M792 Neuralgia and neuritis, unspecified: Secondary | ICD-10-CM | POA: Diagnosis not present

## 2024-03-18 DIAGNOSIS — M792 Neuralgia and neuritis, unspecified: Secondary | ICD-10-CM | POA: Diagnosis not present

## 2024-03-18 DIAGNOSIS — G4700 Insomnia, unspecified: Secondary | ICD-10-CM | POA: Diagnosis not present

## 2024-03-18 DIAGNOSIS — Z6826 Body mass index (BMI) 26.0-26.9, adult: Secondary | ICD-10-CM | POA: Diagnosis not present

## 2024-03-18 DIAGNOSIS — Z87891 Personal history of nicotine dependence: Secondary | ICD-10-CM | POA: Diagnosis not present

## 2024-03-18 DIAGNOSIS — Z7982 Long term (current) use of aspirin: Secondary | ICD-10-CM | POA: Diagnosis not present

## 2024-03-18 DIAGNOSIS — I69398 Other sequelae of cerebral infarction: Secondary | ICD-10-CM | POA: Diagnosis not present

## 2024-03-18 DIAGNOSIS — F482 Pseudobulbar affect: Secondary | ICD-10-CM | POA: Diagnosis not present

## 2024-03-18 DIAGNOSIS — F4321 Adjustment disorder with depressed mood: Secondary | ICD-10-CM | POA: Diagnosis not present

## 2024-03-18 DIAGNOSIS — E663 Overweight: Secondary | ICD-10-CM | POA: Diagnosis not present

## 2024-03-18 DIAGNOSIS — F332 Major depressive disorder, recurrent severe without psychotic features: Secondary | ICD-10-CM | POA: Diagnosis not present

## 2024-03-18 DIAGNOSIS — I10 Essential (primary) hypertension: Secondary | ICD-10-CM | POA: Diagnosis not present

## 2024-03-18 DIAGNOSIS — Z7983 Long term (current) use of bisphosphonates: Secondary | ICD-10-CM | POA: Diagnosis not present

## 2024-03-18 DIAGNOSIS — K5900 Constipation, unspecified: Secondary | ICD-10-CM | POA: Diagnosis not present

## 2024-03-18 DIAGNOSIS — I69354 Hemiplegia and hemiparesis following cerebral infarction affecting left non-dominant side: Secondary | ICD-10-CM | POA: Diagnosis not present

## 2024-03-22 DIAGNOSIS — M792 Neuralgia and neuritis, unspecified: Secondary | ICD-10-CM | POA: Diagnosis not present

## 2024-03-22 DIAGNOSIS — I69398 Other sequelae of cerebral infarction: Secondary | ICD-10-CM | POA: Diagnosis not present

## 2024-03-22 DIAGNOSIS — F482 Pseudobulbar affect: Secondary | ICD-10-CM | POA: Diagnosis not present

## 2024-03-22 DIAGNOSIS — F332 Major depressive disorder, recurrent severe without psychotic features: Secondary | ICD-10-CM | POA: Diagnosis not present

## 2024-03-22 DIAGNOSIS — I69354 Hemiplegia and hemiparesis following cerebral infarction affecting left non-dominant side: Secondary | ICD-10-CM | POA: Diagnosis not present

## 2024-03-22 DIAGNOSIS — I10 Essential (primary) hypertension: Secondary | ICD-10-CM | POA: Diagnosis not present

## 2024-03-29 DIAGNOSIS — F332 Major depressive disorder, recurrent severe without psychotic features: Secondary | ICD-10-CM | POA: Diagnosis not present

## 2024-03-29 DIAGNOSIS — I10 Essential (primary) hypertension: Secondary | ICD-10-CM | POA: Diagnosis not present

## 2024-03-29 DIAGNOSIS — I69398 Other sequelae of cerebral infarction: Secondary | ICD-10-CM | POA: Diagnosis not present

## 2024-03-29 DIAGNOSIS — M792 Neuralgia and neuritis, unspecified: Secondary | ICD-10-CM | POA: Diagnosis not present

## 2024-03-29 DIAGNOSIS — F482 Pseudobulbar affect: Secondary | ICD-10-CM | POA: Diagnosis not present

## 2024-03-29 DIAGNOSIS — I69354 Hemiplegia and hemiparesis following cerebral infarction affecting left non-dominant side: Secondary | ICD-10-CM | POA: Diagnosis not present

## 2024-04-05 DIAGNOSIS — M792 Neuralgia and neuritis, unspecified: Secondary | ICD-10-CM | POA: Diagnosis not present

## 2024-04-05 DIAGNOSIS — I10 Essential (primary) hypertension: Secondary | ICD-10-CM | POA: Diagnosis not present

## 2024-04-05 DIAGNOSIS — F332 Major depressive disorder, recurrent severe without psychotic features: Secondary | ICD-10-CM | POA: Diagnosis not present

## 2024-04-05 DIAGNOSIS — F482 Pseudobulbar affect: Secondary | ICD-10-CM | POA: Diagnosis not present

## 2024-04-05 DIAGNOSIS — I69354 Hemiplegia and hemiparesis following cerebral infarction affecting left non-dominant side: Secondary | ICD-10-CM | POA: Diagnosis not present

## 2024-04-05 DIAGNOSIS — I69398 Other sequelae of cerebral infarction: Secondary | ICD-10-CM | POA: Diagnosis not present

## 2024-04-08 DIAGNOSIS — D2372 Other benign neoplasm of skin of left lower limb, including hip: Secondary | ICD-10-CM | POA: Diagnosis not present

## 2024-04-08 DIAGNOSIS — M24572 Contracture, left ankle: Secondary | ICD-10-CM | POA: Diagnosis not present

## 2024-04-08 DIAGNOSIS — M79672 Pain in left foot: Secondary | ICD-10-CM | POA: Diagnosis not present

## 2024-04-08 DIAGNOSIS — R2689 Other abnormalities of gait and mobility: Secondary | ICD-10-CM | POA: Diagnosis not present

## 2024-04-08 DIAGNOSIS — M21372 Foot drop, left foot: Secondary | ICD-10-CM | POA: Diagnosis not present

## 2024-04-08 DIAGNOSIS — G608 Other hereditary and idiopathic neuropathies: Secondary | ICD-10-CM | POA: Diagnosis not present

## 2024-04-08 DIAGNOSIS — B351 Tinea unguium: Secondary | ICD-10-CM | POA: Diagnosis not present

## 2024-04-12 DIAGNOSIS — F332 Major depressive disorder, recurrent severe without psychotic features: Secondary | ICD-10-CM | POA: Diagnosis not present

## 2024-04-12 DIAGNOSIS — M792 Neuralgia and neuritis, unspecified: Secondary | ICD-10-CM | POA: Diagnosis not present

## 2024-04-12 DIAGNOSIS — I69398 Other sequelae of cerebral infarction: Secondary | ICD-10-CM | POA: Diagnosis not present

## 2024-04-12 DIAGNOSIS — I10 Essential (primary) hypertension: Secondary | ICD-10-CM | POA: Diagnosis not present

## 2024-04-12 DIAGNOSIS — I69354 Hemiplegia and hemiparesis following cerebral infarction affecting left non-dominant side: Secondary | ICD-10-CM | POA: Diagnosis not present

## 2024-04-12 DIAGNOSIS — F482 Pseudobulbar affect: Secondary | ICD-10-CM | POA: Diagnosis not present

## 2024-04-17 DIAGNOSIS — F482 Pseudobulbar affect: Secondary | ICD-10-CM | POA: Diagnosis not present

## 2024-04-17 DIAGNOSIS — G4700 Insomnia, unspecified: Secondary | ICD-10-CM | POA: Diagnosis not present

## 2024-04-17 DIAGNOSIS — Z7983 Long term (current) use of bisphosphonates: Secondary | ICD-10-CM | POA: Diagnosis not present

## 2024-04-17 DIAGNOSIS — E663 Overweight: Secondary | ICD-10-CM | POA: Diagnosis not present

## 2024-04-17 DIAGNOSIS — F4321 Adjustment disorder with depressed mood: Secondary | ICD-10-CM | POA: Diagnosis not present

## 2024-04-17 DIAGNOSIS — I69354 Hemiplegia and hemiparesis following cerebral infarction affecting left non-dominant side: Secondary | ICD-10-CM | POA: Diagnosis not present

## 2024-04-17 DIAGNOSIS — K5900 Constipation, unspecified: Secondary | ICD-10-CM | POA: Diagnosis not present

## 2024-04-17 DIAGNOSIS — Z87891 Personal history of nicotine dependence: Secondary | ICD-10-CM | POA: Diagnosis not present

## 2024-04-17 DIAGNOSIS — Z7982 Long term (current) use of aspirin: Secondary | ICD-10-CM | POA: Diagnosis not present

## 2024-04-17 DIAGNOSIS — F332 Major depressive disorder, recurrent severe without psychotic features: Secondary | ICD-10-CM | POA: Diagnosis not present

## 2024-04-17 DIAGNOSIS — M792 Neuralgia and neuritis, unspecified: Secondary | ICD-10-CM | POA: Diagnosis not present

## 2024-04-17 DIAGNOSIS — I69398 Other sequelae of cerebral infarction: Secondary | ICD-10-CM | POA: Diagnosis not present

## 2024-04-17 DIAGNOSIS — Z6826 Body mass index (BMI) 26.0-26.9, adult: Secondary | ICD-10-CM | POA: Diagnosis not present

## 2024-04-17 DIAGNOSIS — I10 Essential (primary) hypertension: Secondary | ICD-10-CM | POA: Diagnosis not present

## 2024-04-19 DIAGNOSIS — F482 Pseudobulbar affect: Secondary | ICD-10-CM | POA: Diagnosis not present

## 2024-04-19 DIAGNOSIS — M792 Neuralgia and neuritis, unspecified: Secondary | ICD-10-CM | POA: Diagnosis not present

## 2024-04-19 DIAGNOSIS — F332 Major depressive disorder, recurrent severe without psychotic features: Secondary | ICD-10-CM | POA: Diagnosis not present

## 2024-04-19 DIAGNOSIS — I10 Essential (primary) hypertension: Secondary | ICD-10-CM | POA: Diagnosis not present

## 2024-04-19 DIAGNOSIS — I69398 Other sequelae of cerebral infarction: Secondary | ICD-10-CM | POA: Diagnosis not present

## 2024-04-19 DIAGNOSIS — I69354 Hemiplegia and hemiparesis following cerebral infarction affecting left non-dominant side: Secondary | ICD-10-CM | POA: Diagnosis not present

## 2024-04-26 DIAGNOSIS — F482 Pseudobulbar affect: Secondary | ICD-10-CM | POA: Diagnosis not present

## 2024-04-26 DIAGNOSIS — I10 Essential (primary) hypertension: Secondary | ICD-10-CM | POA: Diagnosis not present

## 2024-04-26 DIAGNOSIS — F332 Major depressive disorder, recurrent severe without psychotic features: Secondary | ICD-10-CM | POA: Diagnosis not present

## 2024-04-26 DIAGNOSIS — M792 Neuralgia and neuritis, unspecified: Secondary | ICD-10-CM | POA: Diagnosis not present

## 2024-04-26 DIAGNOSIS — I69354 Hemiplegia and hemiparesis following cerebral infarction affecting left non-dominant side: Secondary | ICD-10-CM | POA: Diagnosis not present

## 2024-04-26 DIAGNOSIS — I69398 Other sequelae of cerebral infarction: Secondary | ICD-10-CM | POA: Diagnosis not present

## 2024-05-04 DIAGNOSIS — I69354 Hemiplegia and hemiparesis following cerebral infarction affecting left non-dominant side: Secondary | ICD-10-CM | POA: Diagnosis not present

## 2024-05-04 DIAGNOSIS — F482 Pseudobulbar affect: Secondary | ICD-10-CM | POA: Diagnosis not present

## 2024-05-04 DIAGNOSIS — I69398 Other sequelae of cerebral infarction: Secondary | ICD-10-CM | POA: Diagnosis not present

## 2024-05-04 DIAGNOSIS — M792 Neuralgia and neuritis, unspecified: Secondary | ICD-10-CM | POA: Diagnosis not present

## 2024-05-04 DIAGNOSIS — F332 Major depressive disorder, recurrent severe without psychotic features: Secondary | ICD-10-CM | POA: Diagnosis not present

## 2024-05-04 DIAGNOSIS — I10 Essential (primary) hypertension: Secondary | ICD-10-CM | POA: Diagnosis not present

## 2024-05-10 DIAGNOSIS — M792 Neuralgia and neuritis, unspecified: Secondary | ICD-10-CM | POA: Diagnosis not present

## 2024-05-10 DIAGNOSIS — F332 Major depressive disorder, recurrent severe without psychotic features: Secondary | ICD-10-CM | POA: Diagnosis not present

## 2024-05-10 DIAGNOSIS — F482 Pseudobulbar affect: Secondary | ICD-10-CM | POA: Diagnosis not present

## 2024-05-10 DIAGNOSIS — I69354 Hemiplegia and hemiparesis following cerebral infarction affecting left non-dominant side: Secondary | ICD-10-CM | POA: Diagnosis not present

## 2024-05-10 DIAGNOSIS — I10 Essential (primary) hypertension: Secondary | ICD-10-CM | POA: Diagnosis not present

## 2024-05-10 DIAGNOSIS — I69398 Other sequelae of cerebral infarction: Secondary | ICD-10-CM | POA: Diagnosis not present

## 2024-05-11 ENCOUNTER — Encounter: Payer: Self-pay | Admitting: Physical Medicine and Rehabilitation

## 2024-05-11 ENCOUNTER — Encounter: Attending: Physical Medicine and Rehabilitation | Admitting: Physical Medicine and Rehabilitation

## 2024-05-11 VITALS — BP 128/69 | HR 60 | Ht 67.0 in

## 2024-05-11 DIAGNOSIS — G4701 Insomnia due to medical condition: Secondary | ICD-10-CM | POA: Insufficient documentation

## 2024-05-11 DIAGNOSIS — E663 Overweight: Secondary | ICD-10-CM | POA: Insufficient documentation

## 2024-05-11 DIAGNOSIS — R252 Cramp and spasm: Secondary | ICD-10-CM | POA: Insufficient documentation

## 2024-05-11 DIAGNOSIS — F482 Pseudobulbar affect: Secondary | ICD-10-CM | POA: Diagnosis not present

## 2024-05-11 DIAGNOSIS — I6300 Cerebral infarction due to thrombosis of unspecified precerebral artery: Secondary | ICD-10-CM | POA: Insufficient documentation

## 2024-05-11 DIAGNOSIS — R4189 Other symptoms and signs involving cognitive functions and awareness: Secondary | ICD-10-CM | POA: Diagnosis not present

## 2024-05-11 NOTE — Progress Notes (Signed)
 Subjective:    Patient ID: Kelly Lowe, female    DOB: August 23, 1950, 73 y.o.   MRN: [id number]  HPI  1) Pseudobulbar affect  -has been stable Kelly Lowe is 73 year old woman who presents with poststroke pseudobulbar affect.   Female with history of HTN, right foot fractures presents for follow for right basal ganglia infarct in 2018. She had weakness on the left side. They tried the SPRINT device, TENS unit, PT and OT, Botox treatments. They tried everything to get the arm and leg working at night.   -she does not want to do talk therapy  Daughter supplements history. Patient is in [facility] therapies.  She is getting out more.    She takes Trintilix and it has helped with her pseudobulbar affect, but she still has frequent crying outbursts  She had a a clear bill of health at her follow-up appointment with Kelly Lowe  -a new AFO was ordered for her.   -has been better  -she is awaiting to hear from Kelly Lowe.   -it had been years since she received her last AFO  The stroke affects her emotions and she had uncontrollable cruing  She has never damage to her mouth and has cotton mouth sensation. Feels numb, swollen, and burning all the same time  She wants to eat certain things because it's easier but may not always be healthier.   Battery went dead in loop recorder.   Has been at home living independently for a year.   She was doing therapy at one time but insurance stopped approving it.   2) Grief: -had a death from a close friend recently, and also lost her mother in 2013  -she followed with Kelly Lowe in the hospital   3) CVA -living independently -home health aid comes three times per week, discussed that she has a great aide  4) Brain fog: -she feels like this has improved with LDN, she would like to stick with this dose  5) overweight: -she has been trying to lose weight  -discussed that she likes [place] foods  Pain Inventory Average Pain 1 Pain  Right Now 2 My pain is heavy & stiffness in left limbs upper & lower, brain fog  In the last 24 hours, has pain interfered with the following? General activity 0 Relation with others 0 Enjoyment of life 0 What TIME of day is your pain at its worst? morning Sleep (in general) Poor  Pain is worse with: bending and inactivity Pain improves with: rest and heat Relief from Meds: very little   Family History  Problem Relation Age of Onset   Stroke Maternal Grandmother    Stroke Paternal Grandfather    Social History   Socioeconomic History   Marital status: Divorced    Spouse name: Not on file   Number of children: Not on file   Years of education: Not on file   Highest education level: Not on file  Occupational History   Not on file  Tobacco Use   Smoking status: Former    Current packs/day: 0.00    Average packs/day: 0.5 packs/day for 25.0 years (12.5 ttl pk-yrs)    Types: Cigarettes    Start date: 05/02/1992    Quit date: 05/02/2017    Years since quitting: 7.0   Smokeless tobacco: Never  Vaping Use   Vaping status: Never Used  Substance and Sexual Activity   Alcohol  use: No    Alcohol /week: 1.0 standard drink of alcohol   Types: 1 Glasses of wine per week    Comment: social   Drug use: No   Sexual activity: Never  Other Topics Concern   Not on file  Social History Narrative   Not on file   Social Drivers of Health   Financial Resource Strain: Not on file  Food Insecurity: Not on file  Transportation Needs: Not on file  Physical Activity: Not on file  Stress: Not on file  Social Connections: Unknown (12/14/2021)   Received from [facility]   Social Network    Social Network: Not on file   Past Surgical History:  Procedure Laterality Date   FOOT SURGERY     LOOP RECORDER INSERTION N/A 10/08/2017   Procedure: LOOP RECORDER INSERTION;  Surgeon: Kelly Elspeth BROCKS, MD;  Location: [facility] INVASIVE CV LAB;  Service: Cardiovascular;  Laterality: N/A;   TEE WITHOUT  CARDIOVERSION N/A 05/08/2017   Procedure: TRANSESOPHAGEAL ECHOCARDIOGRAM (TEE);  Surgeon: Kelly Redell RAMAN, MD;  Location: [facility] ENDOSCOPY;  Service: Cardiovascular;  Laterality: N/A;   Past Medical History:  Diagnosis Date   Hyperlipidemia    Hypertension    Stroke (HCC)    Ht 5' 7 (1.702 m)   BMI 26.00 kg/m   Opioid Risk Score:   Fall Risk Score:  `1  Depression screen [facility] 2/9     05/11/2024   11:07 AM 11/18/2023   10:44 AM 05/20/2023   10:47 AM 11/26/2022   11:20 AM 10/25/2021   11:13 AM 03/22/2021    1:03 PM 11/09/2020    2:26 PM  Depression screen PHQ 2/9  Decreased Interest 0 0 0 1 0 0 0  Down, Depressed, Hopeless  0 0 0 0 0 0  PHQ - 2 Score 0 0 0 1 0 0 0    Review of Systems  Constitutional: Negative.   HENT: Negative.    Eyes: Negative.   Respiratory: Negative.    Cardiovascular: Negative.   Gastrointestinal:        Poor appetite  Endocrine: Negative.   Genitourinary:  Negative for urgency.  Musculoskeletal:  Positive for arthralgias.       Spasms, pain in left shoulder,left hand, left ankle , fingers elbow neck  Skin:  Positive for rash.  Allergic/Immunologic: Negative.   Neurological:  Positive for weakness and numbness.       Tingling   Hematological: Negative.   Psychiatric/Behavioral:  Positive for confusion. Negative for dysphoric mood. The patient is not nervous/anxious.   All other systems reviewed and are negative.     Objective:   Physical Exam  Gen: no distress, normal appearing, overweight BMI 25.62, wight 155 lbs lbs, BP 137/71 HEENT: oral mucosa pink and moist, NCAT Cardio: Reg rate Chest: normal effort, normal rate of breathing Abd: soft, non-distended Ext: no edema Psych: pleasant, normal affect, bright and positive affect Skin: intact Neuro: Left shoulder subluxation, stable Gait: Not assessed today -in wheelchair Neurological: Alert Motor: LUE: 2+/5 shoulder abduction, elbow flex 2/5, elbow ext 2-/5, hand grip 1/5 with  apraxia LLE: 4-/5 HF, 4/5 KE, ADF/PF 2/5  mAS: left shoulder abductors: 2/4, elbow flexors: 2/4, wrist flexors 3/4, finger flexors 3/4, thumb flexors 1/4 Left ADF/PF 1+/4  Follows commands.  Insight and awareness into deficits.  She knows phone numbers, dates, birthdays    Assessment & Plan:  Female with history of HTN, right foot fractures presents for follow-up for right basal ganglia infarct.    1.  Left spastic hemiparesis, fatigue, and functional deficits secondary to  right basal ganglia infarct  Completed HH therapies. Discussed that this only seems to help while she is doing it.   Referred for vagal nerve stimulator, but discussed she may not qualify since her left arm is hemiplegic, discussed outpatient vagal nerve stimulation             No benefit with amantadine   Prescribed home health  Discussed disability.   Discussed wean off baclofen   Provided dietary education  Discussed botox  Encouraged WHO/PRAFO, discussed that she was unable to get it on properly  Dysport injections did not provide enough relief, referred for eval.   Discussed aquatherapy  Continue Baclofen  to 10 at bedtime  Discussed that she has donw Botox in the past  Previously educated on limiting sling use, which patient is now doing resulting in decreased tone - cont ROM Discussed Bioness and vagal nerve stimulation Discussed benefits of exercise.    -discussed new AFO.  Discussed that she failed Botox Discussed extracorporeal shockwave therapy as a modality for treatment. Discussed that the device looks and feels like a massage gun and I would move it over the area of pain for about 10 minutes. The device releases sound waves to the area of pain and helps to improve blood flow and circulation to improve the healing process. Discuss that this initially induces inflammation and can sometimes cause short-term increase in pain. Discussed that we typically do three weekly treatments, but sometimes up to 6 if  needed, and after 6 weeks long term benefits can sometimes be achieved. Discussed that this is an FDA approved device, but not covered by insurance and would cost $60 per session. Will scheduled patient for 6 consecutive appointments and can cancel latter three if benefits are achieved after first three sessions.   Continue stretching at home  2. Gait abnormality  Continue cane for safety   3. Sleep disturbance  Improved overall  4. Mood lability:             Patient, now benefiting with Psychology  Defer Cymbalta  management to Psychiatry             Nudexta ordered by Neurology - but cannot afford  Continue following up with Psychiatry  Major limiting factor - continues to be at present  Continue to follow up with Psych  ?Improving  Continue trintelix.   5. Left shoulder subluxation with pain             Kinesio taping on hold due to reaction to tape             SPRINT placed on 9/5, removed on 11/6             Tramadol  per PCP (educated on signs/symptoms serotonin syndrome)             Ortho consult ordered by Neurology NP - recommended therapies, which she completed  Prescribed Zynex heating/cooling blanket    6. Bony prominence right wrist             Xray with bony prominence             Voltaren  gel in effective  Good benefits with injection on 9/26, patient elected to indefinitely postpone procedure due to lack of pain today   7. Right hand numbness             Managed at present             Positional  Improved  8. Urinary urgency  Unlikely stroke related  Improving  9. Pseudodementia   See #4  Encouraged brain simulating activities  10. Dysesthesias in tongue  Did not respond to Gabapentin   Refilled Lyrica  50 BID with good benefit  Discussed interactions with other medications.   11. Former smoker: -commended on stopping smoking! Discussed that she has continued to stop smoking.   12. HTN: -BP is 137/71 -continue amlodipine  2.5mg  daily and  HCTZ -Advised checking BP daily at home and logging results to bring into follow-up appointment with PCP and myself. -Reviewed BP meds today.  -Advised regarding healthy foods that can help lower blood pressure and provided with a list: 1) citrus foods- high in vitamins and minerals 2) salmon and other fatty fish - reduces inflammation and oxylipins 3) swiss chard (leafy green)- high level of nitrates 4) pumpkin seeds- one of the best natural sources of magnesium 5) Beans and lentils- high in fiber, magnesium, and potassium 6) Berries- high in flavonoids 7) Amaranth (whole grain, can be cooked similarly to rice and oats)- high in magnesium and fiber 8) Pistachios- even more effective at reducing BP than other nuts 9) Carrots- high in phenolic compounds that relax blood vessels and reduce inflammation 10) Celery- contain phthalides that relax tissues of arterial walls 11) Tomatoes- can also improve cholesterol and reduce risk of heart disease 12) Broccoli- good source of magnesium, calcium , and potassium 13) Greek yogurt: high in potassium and calcium  14) Herbs and spices: Celery seed, cilantro, saffron, lemongrass, black cumin, ginseng, cinnamon, cardamom, sweet basil, and ginger 15) Chia and flax seeds- also help to lower cholesterol and blood sugar 16) Beets- high levels of nitrates that relax blood vessels  17) spinach and bananas- high in potassium  -Provided lise of supplements that can help with hypertension:  1) magnesium: one high quality brand is Bioptemizers since it contains all 7 types of magnesium, otherwise over the counter magnesium gluconate 400mg  is a good option 2) B vitamins 3) vitamin D 4) potassium 5) CoQ10 6) L-arginine 7) Vitamin C 8) Beetroot -Educated that goal BP is 120/80. -Made goal to incorporate some of the above foods into diet.    13) Constipation:  -continue magnesium, probiotic, colace, prune juice -made goal to eat 6 prunes per day -Provided  list of following foods that help with constipation and highlighted a few: 1) prunes- contain high amounts of fiber.  2) apples- has a form of dietary fiber called pectin that accelerates stool movement and increases beneficial gut bacteria 3) pears- in addition to fiber, also high in fructose and sorbitol which have laxative effect 4) figs- contain an enzyme ficin which helps to speed colonic transit 5) kiwis- contain an enzyme actinidin that improves gut motility and reduces constipation 6) oranges- rich in pectin (like apples) 7) grapefruits- contain a flavanol naringenin which has a laxative effect 8) vegetables- rich in fiber and also great sources of folate, vitamin C, and K 9) artichoke- high in inulin, prebiotic great for the microbiome 10) chicory- increases stool frequency and softness (can be added to coffee) 11) rhubarb- laxative effect 12) sweet potato- high fiber 13) beans, peas, and lentils- contain both soluble and insoluble fiber 14) chia seeds- improves intestinal health and gut flora 15) flaxseeds- laxative effect 16) whole grain rye bread- high in fiber 17) oat bran- high in soluble and insoluble fiber 18) kefir- softens stools -recommended to try at least one of these foods every day.  -drink 6-8 glasses of water  per day -walk regularly, especially after meals.        14. Provided with list of supplements that can help with dyslipidemia: 1) Vitamin B3 500-4,000mg  in divided doses daily (would recommend starting low as can cause uncomfortable facial flushing if started at too high a dose) 2) Phytosterols 2.15 grams daily 3) Fermented soy 30-50 grams daily 4) EGCG (found in green tea): 500-1000mg  daily 5) Omega-3 fatty acids 3000-5,000mg  daily 6) Flax seed 40 grams daily 7) Monounsaturated fats 20-40 grams daily (olives, olive oil, nuts), also reduces cardiovascular disease 8) Sesame: 40 grams daily 9) Gamma/delta tocotrienols- a family of unsaturated forms of  Vitamin E- 200mg  with dinner 10) Pantethine 900mg  daily in divided doses 11) Resveratrol 250mg  daily 12) N Acetyl Cysteine 2000mg  daily in divided doses 13) Curcumin 2000-5000mg  in divided doses daily 14) Pomegranate juice: 8 ounces daily, also helps to lower blood pressure 15) Pomegranate seeds one cup daily, also helps to lower blood pressure 16) Citrus Bergamot 1000mg  daily, also helps with glucose control and weight loss 17) Vitamin C 500mg  daily 18) Quercetin 500-1000mg  daily 19) Glutathione 20) Probiotics 60-100 billion organisms per day 21) Fiber 22) Oats 23) Aged garlic (can eat as food or supplement of 600-900mg  per day) 24) Chia seeds 25 grams per day 25) Lycopene- carotenoid found in high concentrations in tomatoes. 26) Alpha linolenic acid 27) Flavonoids and anthocyanins 28) Wogonin- flavanoid that enhances reverse cholesterol transport 29) Coenzyme Q10 30) Pantethine- derivative of Vitamin B5: 300mg  three times per day or 450mg  twice per day with or without food 31) Barley and other whole grains 32) Orange juice 33) L- carnitine 34) L- Lysine 35) L- Arginine 36) Almonds 37) Morin 38) Rutin 39) Carnosine 40) Histidine  41) Kaempferol  42) Organosulfur compounds 43) Vitamin E 44) Oleic acid 45) RBO (ferulic acid gammaoryzanol) 46) grape seed extract 47) Red wine 48) Berberine HCL 500mg  daily or twice per day- more effective and with fewer adverse effects that ezetimibe monotherapy 49) red yeast rice 2400- 4800 mg/day 50) chlorella 51) Licorice   15. Cognitive deficits, mild/brain fog: -discussed mechanism of action of low dose naltrexone as an opioid receptor antagonist which stimulates your body's production of its own natural endogenous opioids, helping to decrease pain. Discussed that it can also decrease T cell response and thus be helpful in decreasing inflammation, and symptoms of brain fog, fatigue, anxiety, depression, and allergies. Discussed that this  medication needs to be compounded at a compounding pharmacy and can more expensive. Discussed that I usually start at 1mg  and if this is not providing enough relief then I titrate upward on a monthly basis.   -will call in LDN to [facility], increase to 2mg , discussed that this has been helping -discussed her positive performance on MOCA exam -discussed modafinil -discussed that she continues to have short attention span -discussed that lyrica  and baclofen  can contribute -discussed impaired memory -discussed vascular dementia -encouraged eating fruits and vegetables  16. Dental decay -discussed that she had to have teeth pulled  18. Insomnia: -discussed that she is still waking up several times per night -discussed the importance of getting sunlight exposure during the day. -has failed melatonin supplement  -continue trazodone   -recommended tart cherry juice at night  19. Constipation:  -Provided list of following foods that help with constipation and highlighted a few: 1) prunes- contain high amounts of fiber.  2) apples- has a form of dietary fiber called pectin that accelerates stool movement and increases beneficial  gut bacteria 3) pears- in addition to fiber, also high in fructose and sorbitol which have laxative effect 4) figs- contain an enzyme ficin which helps to speed colonic transit 5) kiwis- contain an enzyme actinidin that improves gut motility and reduces constipation 6) oranges- rich in pectin (like apples) 7) grapefruits- contain a flavanol naringenin which has a laxative effect 8) vegetables- rich in fiber and also great sources of folate, vitamin C, and K 9) artichoke- high in inulin, prebiotic great for the microbiome 10) chicory- increases stool frequency and softness (can be added to coffee) 11) rhubarb- laxative effect 12) sweet potato- high fiber 13) beans, peas, and lentils- contain both soluble and insoluble fiber 14) chia seeds- improves  intestinal health and gut flora 15) flaxseeds- laxative effect 16) whole grain rye bread- high in fiber 17) oat bran- high in soluble and insoluble fiber 18) kefir- softens stools -recommended to try at least one of these foods every day.  -drink 6-8 glasses of water per day -walk regularly, especially after meals.

## 2024-05-14 ENCOUNTER — Telehealth: Payer: Self-pay | Admitting: *Deleted

## 2024-05-14 NOTE — Telephone Encounter (Signed)
 Kelly Lowe called back and reports that the referral for therapy is for outpt [facility] and they do not come to the home. She is not able to go out for therapy and wants HH.

## 2024-05-14 NOTE — Addendum Note (Signed)
 Addended by: LORILEE SVEN SQUIBB on: 05/14/2024 12:16 PM   Modules accepted: Orders

## 2024-05-14 NOTE — Telephone Encounter (Signed)
 Kelly Lowe called and would like to request Dr Lorilee call her. No reason given.

## 2024-05-14 NOTE — Telephone Encounter (Signed)
 Notified referral placed

## 2024-05-18 ENCOUNTER — Ambulatory Visit: Admitting: Physical Medicine and Rehabilitation

## 2024-05-21 ENCOUNTER — Telehealth: Payer: Self-pay

## 2024-05-21 DIAGNOSIS — Z87891 Personal history of nicotine dependence: Secondary | ICD-10-CM | POA: Diagnosis not present

## 2024-05-21 DIAGNOSIS — E785 Hyperlipidemia, unspecified: Secondary | ICD-10-CM | POA: Diagnosis not present

## 2024-05-21 DIAGNOSIS — Z9181 History of falling: Secondary | ICD-10-CM | POA: Diagnosis not present

## 2024-05-21 DIAGNOSIS — Z7983 Long term (current) use of bisphosphonates: Secondary | ICD-10-CM | POA: Diagnosis not present

## 2024-05-21 DIAGNOSIS — G4700 Insomnia, unspecified: Secondary | ICD-10-CM | POA: Diagnosis not present

## 2024-05-21 DIAGNOSIS — I69354 Hemiplegia and hemiparesis following cerebral infarction affecting left non-dominant side: Secondary | ICD-10-CM | POA: Diagnosis not present

## 2024-05-21 DIAGNOSIS — Z602 Problems related to living alone: Secondary | ICD-10-CM | POA: Diagnosis not present

## 2024-05-21 DIAGNOSIS — Z6826 Body mass index (BMI) 26.0-26.9, adult: Secondary | ICD-10-CM | POA: Diagnosis not present

## 2024-05-21 DIAGNOSIS — Z7982 Long term (current) use of aspirin: Secondary | ICD-10-CM | POA: Diagnosis not present

## 2024-05-21 DIAGNOSIS — K5900 Constipation, unspecified: Secondary | ICD-10-CM | POA: Diagnosis not present

## 2024-05-21 DIAGNOSIS — I10 Essential (primary) hypertension: Secondary | ICD-10-CM | POA: Diagnosis not present

## 2024-05-21 DIAGNOSIS — E663 Overweight: Secondary | ICD-10-CM | POA: Diagnosis not present

## 2024-05-21 NOTE — Telephone Encounter (Signed)
 Fax request from [facility] for Naltrexone HCL 1 mg Capsule. Pharmacy has been added.

## 2024-05-25 DIAGNOSIS — I10 Essential (primary) hypertension: Secondary | ICD-10-CM | POA: Diagnosis not present

## 2024-05-25 DIAGNOSIS — I69354 Hemiplegia and hemiparesis following cerebral infarction affecting left non-dominant side: Secondary | ICD-10-CM | POA: Diagnosis not present

## 2024-05-25 DIAGNOSIS — K5900 Constipation, unspecified: Secondary | ICD-10-CM | POA: Diagnosis not present

## 2024-05-25 DIAGNOSIS — E663 Overweight: Secondary | ICD-10-CM | POA: Diagnosis not present

## 2024-05-25 DIAGNOSIS — E785 Hyperlipidemia, unspecified: Secondary | ICD-10-CM | POA: Diagnosis not present

## 2024-05-25 DIAGNOSIS — G4700 Insomnia, unspecified: Secondary | ICD-10-CM | POA: Diagnosis not present

## 2024-05-26 NOTE — Telephone Encounter (Signed)
  error

## 2024-05-27 DIAGNOSIS — I10 Essential (primary) hypertension: Secondary | ICD-10-CM | POA: Diagnosis not present

## 2024-05-27 DIAGNOSIS — G4700 Insomnia, unspecified: Secondary | ICD-10-CM | POA: Diagnosis not present

## 2024-05-27 DIAGNOSIS — I69354 Hemiplegia and hemiparesis following cerebral infarction affecting left non-dominant side: Secondary | ICD-10-CM | POA: Diagnosis not present

## 2024-05-27 DIAGNOSIS — E663 Overweight: Secondary | ICD-10-CM | POA: Diagnosis not present

## 2024-05-27 DIAGNOSIS — E785 Hyperlipidemia, unspecified: Secondary | ICD-10-CM | POA: Diagnosis not present

## 2024-05-27 DIAGNOSIS — K5900 Constipation, unspecified: Secondary | ICD-10-CM | POA: Diagnosis not present

## 2024-06-01 DIAGNOSIS — K5900 Constipation, unspecified: Secondary | ICD-10-CM | POA: Diagnosis not present

## 2024-06-01 DIAGNOSIS — E663 Overweight: Secondary | ICD-10-CM | POA: Diagnosis not present

## 2024-06-01 DIAGNOSIS — I10 Essential (primary) hypertension: Secondary | ICD-10-CM | POA: Diagnosis not present

## 2024-06-01 DIAGNOSIS — I69354 Hemiplegia and hemiparesis following cerebral infarction affecting left non-dominant side: Secondary | ICD-10-CM | POA: Diagnosis not present

## 2024-06-01 DIAGNOSIS — G4700 Insomnia, unspecified: Secondary | ICD-10-CM | POA: Diagnosis not present

## 2024-06-01 DIAGNOSIS — E785 Hyperlipidemia, unspecified: Secondary | ICD-10-CM | POA: Diagnosis not present

## 2024-06-04 DIAGNOSIS — I10 Essential (primary) hypertension: Secondary | ICD-10-CM | POA: Diagnosis not present

## 2024-06-04 DIAGNOSIS — E785 Hyperlipidemia, unspecified: Secondary | ICD-10-CM | POA: Diagnosis not present

## 2024-06-04 DIAGNOSIS — E663 Overweight: Secondary | ICD-10-CM | POA: Diagnosis not present

## 2024-06-04 DIAGNOSIS — I69354 Hemiplegia and hemiparesis following cerebral infarction affecting left non-dominant side: Secondary | ICD-10-CM | POA: Diagnosis not present

## 2024-06-04 DIAGNOSIS — G4700 Insomnia, unspecified: Secondary | ICD-10-CM | POA: Diagnosis not present

## 2024-06-04 DIAGNOSIS — K5900 Constipation, unspecified: Secondary | ICD-10-CM | POA: Diagnosis not present

## 2024-06-07 ENCOUNTER — Telehealth: Payer: Self-pay

## 2024-06-07 NOTE — Telephone Encounter (Signed)
 The okay for in the home occupational therapy has been given to [facility]. Frequency to be once a week for 4 weeks, skip a week, then once a week for 2 weeks. Call back phone [phone].

## 2024-06-08 ENCOUNTER — Other Ambulatory Visit: Payer: Self-pay | Admitting: Physical Medicine and Rehabilitation

## 2024-06-08 DIAGNOSIS — I10 Essential (primary) hypertension: Secondary | ICD-10-CM | POA: Diagnosis not present

## 2024-06-08 DIAGNOSIS — I69354 Hemiplegia and hemiparesis following cerebral infarction affecting left non-dominant side: Secondary | ICD-10-CM | POA: Diagnosis not present

## 2024-06-08 DIAGNOSIS — G4700 Insomnia, unspecified: Secondary | ICD-10-CM | POA: Diagnosis not present

## 2024-06-08 DIAGNOSIS — E663 Overweight: Secondary | ICD-10-CM | POA: Diagnosis not present

## 2024-06-08 DIAGNOSIS — K5900 Constipation, unspecified: Secondary | ICD-10-CM | POA: Diagnosis not present

## 2024-06-08 DIAGNOSIS — E785 Hyperlipidemia, unspecified: Secondary | ICD-10-CM | POA: Diagnosis not present

## 2024-06-10 DIAGNOSIS — D2372 Other benign neoplasm of skin of left lower limb, including hip: Secondary | ICD-10-CM | POA: Diagnosis not present

## 2024-06-10 DIAGNOSIS — M24572 Contracture, left ankle: Secondary | ICD-10-CM | POA: Diagnosis not present

## 2024-06-11 DIAGNOSIS — E785 Hyperlipidemia, unspecified: Secondary | ICD-10-CM | POA: Diagnosis not present

## 2024-06-11 DIAGNOSIS — I69354 Hemiplegia and hemiparesis following cerebral infarction affecting left non-dominant side: Secondary | ICD-10-CM | POA: Diagnosis not present

## 2024-06-11 DIAGNOSIS — K5900 Constipation, unspecified: Secondary | ICD-10-CM | POA: Diagnosis not present

## 2024-06-11 DIAGNOSIS — I10 Essential (primary) hypertension: Secondary | ICD-10-CM | POA: Diagnosis not present

## 2024-06-11 DIAGNOSIS — G4700 Insomnia, unspecified: Secondary | ICD-10-CM | POA: Diagnosis not present

## 2024-06-11 DIAGNOSIS — E663 Overweight: Secondary | ICD-10-CM | POA: Diagnosis not present

## 2024-06-14 ENCOUNTER — Telehealth: Payer: Self-pay | Admitting: *Deleted

## 2024-06-14 NOTE — Telephone Encounter (Signed)
 [name] called from [facility] to report that Kelly Lowe had a fall and had only minimal injury to back of leg.

## 2024-06-16 DIAGNOSIS — I10 Essential (primary) hypertension: Secondary | ICD-10-CM | POA: Diagnosis not present

## 2024-06-16 DIAGNOSIS — I69354 Hemiplegia and hemiparesis following cerebral infarction affecting left non-dominant side: Secondary | ICD-10-CM | POA: Diagnosis not present

## 2024-06-16 DIAGNOSIS — E785 Hyperlipidemia, unspecified: Secondary | ICD-10-CM | POA: Diagnosis not present

## 2024-06-16 DIAGNOSIS — G4700 Insomnia, unspecified: Secondary | ICD-10-CM | POA: Diagnosis not present

## 2024-06-16 DIAGNOSIS — K5900 Constipation, unspecified: Secondary | ICD-10-CM | POA: Diagnosis not present

## 2024-06-16 DIAGNOSIS — E663 Overweight: Secondary | ICD-10-CM | POA: Diagnosis not present

## 2024-06-17 DIAGNOSIS — I10 Essential (primary) hypertension: Secondary | ICD-10-CM | POA: Diagnosis not present

## 2024-06-17 DIAGNOSIS — E785 Hyperlipidemia, unspecified: Secondary | ICD-10-CM | POA: Diagnosis not present

## 2024-06-17 DIAGNOSIS — G4700 Insomnia, unspecified: Secondary | ICD-10-CM | POA: Diagnosis not present

## 2024-06-17 DIAGNOSIS — K5900 Constipation, unspecified: Secondary | ICD-10-CM | POA: Diagnosis not present

## 2024-06-17 DIAGNOSIS — I69354 Hemiplegia and hemiparesis following cerebral infarction affecting left non-dominant side: Secondary | ICD-10-CM | POA: Diagnosis not present

## 2024-06-17 DIAGNOSIS — E663 Overweight: Secondary | ICD-10-CM | POA: Diagnosis not present

## 2024-06-20 DIAGNOSIS — Z23 Encounter for immunization: Secondary | ICD-10-CM | POA: Diagnosis not present

## 2024-06-22 ENCOUNTER — Encounter: Attending: Physical Medicine and Rehabilitation | Admitting: Physical Medicine and Rehabilitation

## 2024-06-22 DIAGNOSIS — R252 Cramp and spasm: Secondary | ICD-10-CM | POA: Insufficient documentation

## 2024-06-22 DIAGNOSIS — I69352 Hemiplegia and hemiparesis following cerebral infarction affecting left dominant side: Secondary | ICD-10-CM | POA: Insufficient documentation

## 2024-06-22 MED ORDER — ONABOTULINUMTOXINA 100 UNITS IJ SOLR
100.0000 [IU] | Freq: Once | INTRAMUSCULAR | Status: AC
Start: 2024-06-22 — End: 2024-06-22
  Administered 2024-06-22: 100 [IU] via INTRAMUSCULAR

## 2024-06-22 NOTE — Progress Notes (Signed)
 Botox Injection for spasticity using US  guidance  Indication: Severe spasticity which interferes with ADL,mobility and/or  hygiene and is unresponsive to medication management and other conservative care Informed consent was obtained after describing risks and benefits of the procedure with the patient. This includes bleeding, bruising, infection, excessive weakness, or medication side effects. A REMS form is on file and signed. Biceps, left: 100U All injections were done after obtaining appropriate EMG activity and after negative drawback for blood. The patient tolerated the procedure well. Post procedure instructions were given. A followup appointment was made.

## 2024-07-27 ENCOUNTER — Telehealth: Payer: Self-pay

## 2024-07-27 NOTE — Telephone Encounter (Signed)
 Chyrl, PT from [facility] called for HHPT verbal orders for 1wk7 for strengthening, balance, gait training, fall prevention. Orders approved and given.

## 2024-09-09 ENCOUNTER — Telehealth: Payer: Self-pay | Admitting: *Deleted

## 2024-09-09 NOTE — Telephone Encounter (Signed)
 Nat with [facility] PT called to report that Kelly Lowe is complaining of increased spasticity and increased light headedness. It is so bad that at times she feels like she is going to fall. HH reports VS are stable. She feels like she needs to be assessed. It looks like her botox  appt is not until 09/21/24. Please advise.

## 2024-09-10 ENCOUNTER — Encounter: Attending: Physical Medicine and Rehabilitation | Admitting: Physical Medicine and Rehabilitation

## 2024-09-10 DIAGNOSIS — I10 Essential (primary) hypertension: Secondary | ICD-10-CM

## 2024-09-10 DIAGNOSIS — R269 Unspecified abnormalities of gait and mobility: Secondary | ICD-10-CM

## 2024-09-10 DIAGNOSIS — R42 Dizziness and giddiness: Secondary | ICD-10-CM

## 2024-09-10 DIAGNOSIS — R252 Cramp and spasm: Secondary | ICD-10-CM

## 2024-09-10 NOTE — Progress Notes (Signed)
 "  Subjective:    Patient ID: Kelly Lowe, female    DOB: 08/09/1950, 74 y.o.   MRN: [id number]  HPI  An audio/video tele-health visit is felt to be the most appropriate encounter for this patient at this time. This is a follow up tele-visit via phone. The patient is at home. MD is at office. Prior to scheduling this appointment, our staff discussed the limitations of evaluation and management by telemedicine and the availability of in-person appointments. The patient expressed understanding and agreed to proceed.   1) Pseudobulbar affect  -has been stable Kelly Lowe is 74 year old woman who presents with poststroke pseudobulbar affect.   Female with history of HTN, right foot fractures presents for follow for right basal ganglia infarct in 2018. She had weakness on the left side. They tried the SPRINT device, TENS unit, PT and OT, Botox  treatments. They tried everything to get the arm and leg working at night.   -she does not want to do talk therapy  Daughter supplements history. Patient is in [facility].  She is getting out more.    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 Rosemarie  -a new AFO was ordered for her.   -has been better  -she is awaiting to hear from [facility].   -it had been years since she received her last AFO  The stroke affects her emotions and she had uncontrollable cruing  She has never damage to her mouth and has cotton mouth sensation. Feels numb, swollen, and burning all the same time  She wants to eat certain things because it's easier but may not always be healthier.   Battery went dead in loop recorder.   Has been at home living independently for a year.   She was doing therapy at one time but insurance stopped approving it.   2) Grief: -had a death from a close friend recently, and also lost her  mother in 2013  -she followed with Dr. Corina in the hospital   3) CVA -living independently -home health aid comes three times per week, discussed that she has a great aide  4) Brain fog: -she feels like this has improved with LDN, she would like to stick with this dose  5) overweight: -she has been trying to lose weight  -discussed that she likes southern foods  6) Spasticity: -she felt stiff this week and lightheaded -she felt the stiffness is due to the cold weather -she worked with therapy this week -her daughter tells her to take her medications with food -she is feeling better now  Pain Inventory Average Pain 1 Pain Right Now 2 My pain is heavy & stiffness in left limbs upper & lower, brain fog  In the last 24 hours, has pain interfered with the following? General activity 0 Relation with others 0 Enjoyment of life 0 What TIME of day is your pain at its worst? morning Sleep (in general) Poor  Pain is worse with: bending and inactivity Pain improves with: rest and heat Relief from Meds: very little   Family History  Problem Relation Age of Onset   Stroke Maternal Grandmother    Stroke Paternal Grandfather    Social History   Socioeconomic History   Marital status: Divorced    Spouse name: Not on file   Number of children: Not on  file   Years of education: Not on file   Highest education level: Not on file  Occupational History   Not on file  Tobacco Use   Smoking status: Former    Current packs/day: 0.00    Average packs/day: 0.5 packs/day for 25.0 years (12.5 ttl pk-yrs)    Types: Cigarettes    Start date: 05/02/1992    Quit date: 05/02/2017    Years since quitting: 7.3   Smokeless tobacco: Never  Vaping Use   Vaping status: Never Used  Substance and Sexual Activity   Alcohol  use: No    Alcohol /week: 1.0 standard drink of alcohol     Types: 1 Glasses of wine per week    Comment: social   Drug use: No   Sexual activity: Never  Other  Topics Concern   Not on file  Social History Narrative   Not on file   Social Drivers of Health   Tobacco Use: Medium Risk (11/18/2023)   Patient History    Smoking Tobacco Use: Former    Smokeless Tobacco Use: Never    Passive Exposure: Not on Actuary Strain: Not on file  Food Insecurity: Not on file  Transportation Needs: Not on file  Physical Activity: Not on file  Stress: Not on file  Social Connections: Unknown (12/14/2021)   Received from [facility]   Social Network    Social Network: Not on file  Depression (PHQ2-9): Low Risk (05/11/2024)   Depression (PHQ2-9)    PHQ-2 Score: 0  Alcohol  Screen: Not on file  Housing: Not on file  Utilities: Not on file  Health Literacy: Not on file   Past Surgical History:  Procedure Laterality Date   FOOT SURGERY     LOOP RECORDER INSERTION N/A 10/08/2017   Procedure: LOOP RECORDER INSERTION;  Surgeon: Fernande Elspeth BROCKS, MD;  Location: [facility] INVASIVE CV LAB;  Service: Cardiovascular;  Laterality: N/A;   TEE WITHOUT CARDIOVERSION N/A 05/08/2017   Procedure: TRANSESOPHAGEAL ECHOCARDIOGRAM (TEE);  Surgeon: Pietro Redell RAMAN, MD;  Location: [facility] ENDOSCOPY;  Service: Cardiovascular;  Laterality: N/A;   Past Medical History:  Diagnosis Date   Hyperlipidemia    Hypertension    Stroke (HCC)    There were no vitals taken for this visit.  Opioid Risk Score:   Fall Risk Score:  `1  Depression screen [facility] 2/9     05/11/2024   11:07 AM 11/18/2023   10:44 AM 05/20/2023   10:47 AM 11/26/2022   11:20 AM 10/25/2021   11:13 AM 03/22/2021    1:03 PM 11/09/2020    2:26 PM  Depression screen PHQ 2/9  Decreased Interest 0 0 0 1 0 0 0  Down, Depressed, Hopeless  0 0 0 0 0 0  PHQ - 2 Score 0 0 0 1 0 0 0    Review of Systems  Constitutional: Negative.   HENT: Negative.    Eyes: Negative.   Respiratory: Negative.    Cardiovascular: Negative.   Gastrointestinal:        Poor appetite  Endocrine: Negative.   Genitourinary:  Negative  for urgency.  Musculoskeletal:  Positive for arthralgias.       Spasms, pain in left shoulder,left hand, left ankle , fingers elbow neck  Skin:  Positive for rash.  Allergic/Immunologic: Negative.   Neurological:  Positive for weakness and numbness.       Tingling   Hematological: Negative.   Psychiatric/Behavioral:  Positive for confusion. Negative for dysphoric mood. The patient  is not nervous/anxious.   All other systems reviewed and are negative.     Objective:   Physical Exam  PRIOR HISTORY: Gen: no distress, normal appearing, overweight BMI 25.62, wight 155 lbs lbs, BP 137/71 HEENT: oral mucosa pink and moist, NCAT Cardio: Reg rate Chest: normal effort, normal rate of breathing Abd: soft, non-distended Ext: no edema Psych: pleasant, normal affect, bright and positive affect Skin: intact Neuro: Left shoulder subluxation, stable Gait: Not assessed today -in wheelchair Neurological: Alert Motor: LUE: 2+/5 shoulder abduction, elbow flex 2/5, elbow ext 2-/5, hand grip 1/5 with apraxia LLE: 4-/5 HF, 4/5 KE, ADF/PF 2/5  mAS: left shoulder abductors: 2/4, elbow flexors: 2/4, wrist flexors 3/4, finger flexors 3/4, thumb flexors 1/4 Left ADF/PF 1+/4  Follows commands.  Insight and awareness into deficits.  She knows phone numbers, dates, birthdays    Assessment & Plan:  Female with history of HTN, right foot fractures presents for follow-up for right basal ganglia infarct.    1.  Left spastic hemiparesis, fatigue, and functional deficits secondary to right basal ganglia infarct  Discussed that she had a tough 2-3 days  Increase Botox  to 200U next visit  Completed HH therapies. Discussed that this only seems to help while she is doing it.   Referred for vagal nerve stimulator, but discussed she may not qualify since her left arm is hemiplegic, discussed outpatient vagal nerve stimulation             No benefit with amantadine   Prescribed home health  Discussed disability.    Discussed wean off baclofen   Provided dietary education  Discussed botox   Encouraged WHO/PRAFO, discussed that she was unable to get it on properly  Dysport injections did not provide enough relief, referred for eval.   Discussed aquatherapy  Continue Baclofen  to 10 at bedtime  Discussed that she has donw Botox  in the past  Previously educated on limiting sling use, which patient is now doing resulting in decreased tone - cont ROM Discussed Bioness and vagal nerve stimulation Discussed benefits of exercise.    -discussed new AFO.  Discussed that she failed Botox  Discussed extracorporeal shockwave therapy as a modality for treatment. Discussed that the device looks and feels like a massage gun and I would move it over the area of pain for about 10 minutes. The device releases sound waves to the area of pain and helps to improve blood flow and circulation to improve the healing process. Discuss that this initially induces inflammation and can sometimes cause short-term increase in pain. Discussed that we typically do three weekly treatments, but sometimes up to 6 if needed, and after 6 weeks long term benefits can sometimes be achieved. Discussed that this is an FDA approved device, but not covered by insurance and would cost $60 per session. Will scheduled patient for 6 consecutive appointments and can cancel latter three if benefits are achieved after first three sessions.   Continue stretching at home  2. Gait abnormality  Continue cane for safety   3. Sleep disturbance  Improved overall  4. Mood lability:             Patient, now benefiting with Psychology  Defer Cymbalta  management to Psychiatry             Nudexta ordered by Neurology - but cannot afford  Continue following up with Psychiatry  Major limiting factor - continues to be at present  Continue to follow up with Psych  ?Improving  Continue trintelix.  5. Left shoulder subluxation with pain             Kinesio taping  on hold due to reaction to tape             SPRINT placed on 9/5, removed on 11/6             Tramadol  per PCP (educated on signs/symptoms serotonin syndrome)             Ortho consult ordered by Neurology NP - recommended therapies, which she completed  Prescribed Zynex heating/cooling blanket    6. Bony prominence right wrist             Xray with bony prominence             Voltaren  gel in effective  Good benefits with injection on 9/26, patient elected to indefinitely postpone procedure due to lack of pain today   7. Right hand numbness             Managed at present             Positional             Improved  8. Urinary urgency  Unlikely stroke related  Improving  9. Pseudodementia   See #4  Encouraged brain simulating activities  10. Dysesthesias in tongue  Did not respond to Gabapentin   Refilled Lyrica  50 BID with good benefit  Discussed interactions with other medications.   11. Former smoker: -commended on stopping smoking! Discussed that she has continued to stop smoking.   12. HTN: -BP is 137/71 -continue amlodipine  2.5mg  daily and HCTZ -Advised checking BP daily at home and logging results to bring into follow-up appointment with PCP and myself. -Reviewed BP meds today.  -Advised regarding healthy foods that can help lower blood pressure and provided with a list: 1) citrus foods- high in vitamins and minerals 2) salmon and other fatty fish - reduces inflammation and oxylipins 3) swiss chard (leafy green)- high level of nitrates 4) pumpkin seeds- one of the best natural sources of magnesium 5) Beans and lentils- high in fiber, magnesium, and potassium 6) Berries- high in flavonoids 7) Amaranth (whole grain, can be cooked similarly to rice and oats)- high in magnesium and fiber 8) Pistachios- even more effective at reducing BP than other nuts 9) Carrots- high in phenolic compounds that relax blood vessels and reduce inflammation 10) Celery- contain phthalides  that relax tissues of arterial walls 11) Tomatoes- can also improve cholesterol and reduce risk of heart disease 12) Broccoli- good source of magnesium, calcium , and potassium 13) Greek yogurt: high in potassium and calcium  14) Herbs and spices: Celery seed, cilantro, saffron, lemongrass, black cumin, ginseng, cinnamon, cardamom, sweet basil, and ginger 15) Chia and flax seeds- also help to lower cholesterol and blood sugar 16) Beets- high levels of nitrates that relax blood vessels  17) spinach and bananas- high in potassium  -Provided lise of supplements that can help with hypertension:  1) magnesium: one high quality brand is Bioptemizers since it contains all 7 types of magnesium, otherwise over the counter magnesium gluconate 400mg  is a good option 2) B vitamins 3) vitamin D 4) potassium 5) CoQ10 6) L-arginine 7) Vitamin C 8) Beetroot -Educated that goal BP is 120/80. -Made goal to incorporate some of the above foods into diet.    13) Constipation:  -continue magnesium, probiotic, colace, prune juice -made goal to eat 6 prunes per day -Provided list of following foods that help  with constipation and highlighted a few: 1) prunes- contain high amounts of fiber.  2) apples- has a form of dietary fiber called pectin that accelerates stool movement and increases beneficial gut bacteria 3) pears- in addition to fiber, also high in fructose and sorbitol which have laxative effect 4) figs- contain an enzyme ficin which helps to speed colonic transit 5) kiwis- contain an enzyme actinidin that improves gut motility and reduces constipation 6) oranges- rich in pectin (like apples) 7) grapefruits- contain a flavanol naringenin which has a laxative effect 8) vegetables- rich in fiber and also great sources of folate, vitamin C, and K 9) artichoke- high in inulin, prebiotic great for the microbiome 10) chicory- increases stool frequency and softness (can be added to coffee) 11) rhubarb-  laxative effect 12) sweet potato- high fiber 13) beans, peas, and lentils- contain both soluble and insoluble fiber 14) chia seeds- improves intestinal health and gut flora 15) flaxseeds- laxative effect 16) whole grain rye bread- high in fiber 17) oat bran- high in soluble and insoluble fiber 18) kefir- softens stools -recommended to try at least one of these foods every day.  -drink 6-8 glasses of water per day -walk regularly, especially after meals.        14. Provided with list of supplements that can help with dyslipidemia: 1) Vitamin B3 500-4,000mg  in divided doses daily (would recommend starting low as can cause uncomfortable facial flushing if started at too high a dose) 2) Phytosterols 2.15 grams daily 3) Fermented soy 30-50 grams daily 4) EGCG (found in green tea): 500-1000mg  daily 5) Omega-3 fatty acids 3000-5,000mg  daily 6) Flax seed 40 grams daily 7) Monounsaturated fats 20-40 grams daily (olives, olive oil, nuts), also reduces cardiovascular disease 8) Sesame: 40 grams daily 9) Gamma/delta tocotrienols- a family of unsaturated forms of Vitamin E- 200mg  with dinner 10) Pantethine 900mg  daily in divided doses 11) Resveratrol 250mg  daily 12) N Acetyl Cysteine 2000mg  daily in divided doses 13) Curcumin 2000-5000mg  in divided doses daily 14) Pomegranate juice: 8 ounces daily, also helps to lower blood pressure 15) Pomegranate seeds one cup daily, also helps to lower blood pressure 16) Citrus Bergamot 1000mg  daily, also helps with glucose control and weight loss 17) Vitamin C 500mg  daily 18) Quercetin 500-1000mg  daily 19) Glutathione 20) Probiotics 60-100 billion organisms per day 21) Fiber 22) Oats 23) Aged garlic (can eat as food or supplement of 600-900mg  per day) 24) Chia seeds 25 grams per day 25) Lycopene- carotenoid found in high concentrations in tomatoes. 26) Alpha linolenic acid 27) Flavonoids and anthocyanins 28) Wogonin- flavanoid that enhances reverse  cholesterol transport 29) Coenzyme Q10 30) Pantethine- derivative of Vitamin B5: 300mg  three times per day or 450mg  twice per day with or without food 31) Barley and other whole grains 32) Orange juice 33) L- carnitine 34) L- Lysine 35) L- Arginine 36) Almonds 37) Morin 38) Rutin 39) Carnosine 40) Histidine  41) Kaempferol  42) Organosulfur compounds 43) Vitamin E 44) Oleic acid 45) RBO (ferulic acid gammaoryzanol) 46) grape seed extract 47) Red wine 48) Berberine HCL 500mg  daily or twice per day- more effective and with fewer adverse effects that ezetimibe monotherapy 49) red yeast rice 2400- 4800 mg/day 50) chlorella 51) Licorice   15. Cognitive deficits, mild/brain fog: -discussed mechanism of action of low dose naltrexone as an opioid receptor antagonist which stimulates your body's production of its own natural endogenous opioids, helping to decrease pain. Discussed that it can also decrease T cell response and thus be  helpful in decreasing inflammation, and symptoms of brain fog, fatigue, anxiety, depression, and allergies. Discussed that this medication needs to be compounded at a compounding pharmacy and can more expensive. Discussed that I usually start at 1mg  and if this is not providing enough relief then I titrate upward on a monthly basis.   -will call in LDN to [facility], increase to 2mg , discussed that this has been helping -discussed her positive performance on MOCA exam -discussed modafinil -discussed that she continues to have short attention span -discussed that lyrica  and baclofen  can contribute -discussed impaired memory -discussed vascular dementia -encouraged eating fruits and vegetables  16. Dental decay -discussed that she had to have teeth pulled  18. Insomnia: -discussed that she is still waking up several times per night -discussed the importance of getting sunlight exposure during the day. -has failed melatonin supplement  -continue  trazodone   -recommended tart cherry juice at night  19. Constipation:  -Provided list of following foods that help with constipation and highlighted a few: 1) prunes- contain high amounts of fiber.  2) apples- has a form of dietary fiber called pectin that accelerates stool movement and increases beneficial gut bacteria 3) pears- in addition to fiber, also high in fructose and sorbitol which have laxative effect 4) figs- contain an enzyme ficin which helps to speed colonic transit 5) kiwis- contain an enzyme actinidin that improves gut motility and reduces constipation 6) oranges- rich in pectin (like apples) 7) grapefruits- contain a flavanol naringenin which has a laxative effect 8) vegetables- rich in fiber and also great sources of folate, vitamin C, and K 9) artichoke- high in inulin, prebiotic great for the microbiome 10) chicory- increases stool frequency and softness (can be added to coffee) 11) rhubarb- laxative effect 12) sweet potato- high fiber 13) beans, peas, and lentils- contain both soluble and insoluble fiber 14) chia seeds- improves intestinal health and gut flora 15) flaxseeds- laxative effect 16) whole grain rye bread- high in fiber 17) oat bran- high in soluble and insoluble fiber 18) kefir- softens stools -recommended to try at least one of these foods every day.  -drink 6-8 glasses of water per day -walk regularly, especially after meals.   20. Lightheadedness: -they checked her BP -she is on amlodipine  2.5mg  daily -her BP was higher during therapy this week -she used to check her BP at home but something was wrong with her BP machine.        "

## 2024-09-21 ENCOUNTER — Encounter: Admitting: Physical Medicine and Rehabilitation
